# Patient Record
Sex: Male | Born: 1935 | Race: White | Hispanic: No | Marital: Married | Smoking: Former smoker
Health system: Southern US, Community
[De-identification: ages and names within clinical notes are randomized; demographics above are authoritative.]

## PROBLEM LIST (undated history)

## (undated) DIAGNOSIS — I2510 Atherosclerotic heart disease of native coronary artery without angina pectoris: Secondary | ICD-10-CM

## (undated) DIAGNOSIS — R2681 Unsteadiness on feet: Secondary | ICD-10-CM

## (undated) DIAGNOSIS — N189 Chronic kidney disease, unspecified: Secondary | ICD-10-CM

## (undated) DIAGNOSIS — R413 Other amnesia: Secondary | ICD-10-CM

## (undated) DIAGNOSIS — K219 Gastro-esophageal reflux disease without esophagitis: Secondary | ICD-10-CM

## (undated) DIAGNOSIS — C61 Malignant neoplasm of prostate: Secondary | ICD-10-CM

## (undated) DIAGNOSIS — M1990 Unspecified osteoarthritis, unspecified site: Secondary | ICD-10-CM

## (undated) DIAGNOSIS — K2270 Barrett's esophagus without dysplasia: Secondary | ICD-10-CM

## (undated) DIAGNOSIS — I480 Paroxysmal atrial fibrillation: Secondary | ICD-10-CM

## (undated) DIAGNOSIS — E785 Hyperlipidemia, unspecified: Secondary | ICD-10-CM

## (undated) DIAGNOSIS — I4891 Unspecified atrial fibrillation: Secondary | ICD-10-CM

## (undated) DIAGNOSIS — K5730 Diverticulosis of large intestine without perforation or abscess without bleeding: Secondary | ICD-10-CM

## (undated) DIAGNOSIS — N289 Disorder of kidney and ureter, unspecified: Secondary | ICD-10-CM

## (undated) DIAGNOSIS — I509 Heart failure, unspecified: Secondary | ICD-10-CM

## (undated) DIAGNOSIS — Z950 Presence of cardiac pacemaker: Secondary | ICD-10-CM

## (undated) DIAGNOSIS — I10 Essential (primary) hypertension: Secondary | ICD-10-CM

## (undated) HISTORY — DX: Barrett's esophagus without dysplasia: K22.70

## (undated) HISTORY — DX: Gastro-esophageal reflux disease without esophagitis: K21.9

## (undated) HISTORY — DX: Other amnesia: R41.3

## (undated) HISTORY — PX: DOPPLER ECHOCARDIOGRAPHY: SHX263

## (undated) HISTORY — DX: Unspecified atrial fibrillation: I48.91

## (undated) HISTORY — DX: Unspecified osteoarthritis, unspecified site: M19.90

## (undated) HISTORY — DX: Disorder of kidney and ureter, unspecified: N28.9

## (undated) HISTORY — DX: Hyperlipidemia, unspecified: E78.5

## (undated) HISTORY — DX: Essential (primary) hypertension: I10

## (undated) HISTORY — DX: Heart failure, unspecified: I50.9

## (undated) HISTORY — DX: Presence of cardiac pacemaker: Z95.0

## (undated) HISTORY — DX: Paroxysmal atrial fibrillation: I48.0

## (undated) HISTORY — DX: Chronic kidney disease, unspecified: N18.9

## (undated) HISTORY — DX: Atherosclerotic heart disease of native coronary artery without angina pectoris: I25.10

## (undated) HISTORY — DX: Diverticulosis of large intestine without perforation or abscess without bleeding: K57.30

## (undated) HISTORY — DX: Malignant neoplasm of prostate: C61

## (undated) HISTORY — DX: Unsteadiness on feet: R26.81

## (undated) HISTORY — PX: HERNIA REPAIR: SHX51

---

## 1973-08-10 HISTORY — PX: CORONARY ARTERY BYPASS GRAFT: SHX141

## 2001-06-02 ENCOUNTER — Ambulatory Visit (HOSPITAL_COMMUNITY): Admission: RE | Admit: 2001-06-02 | Discharge: 2001-06-02 | Payer: Self-pay | Admitting: Family Medicine

## 2001-06-02 ENCOUNTER — Encounter: Payer: Self-pay | Admitting: Family Medicine

## 2001-07-01 ENCOUNTER — Other Ambulatory Visit: Admission: RE | Admit: 2001-07-01 | Discharge: 2001-07-01 | Payer: Self-pay | Admitting: Urology

## 2001-07-22 ENCOUNTER — Other Ambulatory Visit: Admission: RE | Admit: 2001-07-22 | Discharge: 2001-07-22 | Payer: Self-pay | Admitting: General Surgery

## 2001-07-26 ENCOUNTER — Encounter: Payer: Self-pay | Admitting: Gastroenterology

## 2001-07-26 LAB — HM COLONOSCOPY

## 2001-07-27 ENCOUNTER — Other Ambulatory Visit: Admission: RE | Admit: 2001-07-27 | Discharge: 2001-07-27 | Payer: Self-pay | Admitting: Gastroenterology

## 2001-07-27 ENCOUNTER — Encounter (INDEPENDENT_AMBULATORY_CARE_PROVIDER_SITE_OTHER): Payer: Self-pay | Admitting: Specialist

## 2002-01-04 ENCOUNTER — Ambulatory Visit: Admission: RE | Admit: 2002-01-04 | Discharge: 2002-01-04 | Payer: Self-pay | Admitting: Orthopedic Surgery

## 2003-09-11 DIAGNOSIS — Z950 Presence of cardiac pacemaker: Secondary | ICD-10-CM

## 2003-09-11 HISTORY — DX: Presence of cardiac pacemaker: Z95.0

## 2003-09-11 HISTORY — PX: PACEMAKER PLACEMENT: SHX43

## 2003-10-01 ENCOUNTER — Inpatient Hospital Stay (HOSPITAL_COMMUNITY): Admission: EM | Admit: 2003-10-01 | Discharge: 2003-10-10 | Payer: Self-pay | Admitting: Cardiology

## 2003-10-01 ENCOUNTER — Emergency Department (HOSPITAL_COMMUNITY): Admission: EM | Admit: 2003-10-01 | Discharge: 2003-10-01 | Payer: Self-pay | Admitting: Emergency Medicine

## 2003-10-02 ENCOUNTER — Encounter: Payer: Self-pay | Admitting: Cardiology

## 2004-03-20 ENCOUNTER — Ambulatory Visit (HOSPITAL_COMMUNITY): Admission: RE | Admit: 2004-03-20 | Discharge: 2004-03-20 | Payer: Self-pay | Admitting: Urology

## 2004-09-30 ENCOUNTER — Ambulatory Visit: Payer: Self-pay | Admitting: Internal Medicine

## 2004-10-02 ENCOUNTER — Ambulatory Visit: Payer: Self-pay | Admitting: Cardiology

## 2004-10-08 ENCOUNTER — Ambulatory Visit: Payer: Self-pay

## 2004-10-16 ENCOUNTER — Inpatient Hospital Stay (HOSPITAL_COMMUNITY): Admission: AD | Admit: 2004-10-16 | Discharge: 2004-10-19 | Payer: Self-pay | Admitting: Internal Medicine

## 2004-10-16 ENCOUNTER — Ambulatory Visit: Payer: Self-pay | Admitting: Internal Medicine

## 2004-10-29 ENCOUNTER — Ambulatory Visit: Payer: Self-pay | Admitting: Internal Medicine

## 2004-11-07 ENCOUNTER — Ambulatory Visit: Payer: Self-pay | Admitting: *Deleted

## 2004-11-15 ENCOUNTER — Ambulatory Visit: Payer: Self-pay | Admitting: Cardiology

## 2004-11-26 ENCOUNTER — Ambulatory Visit: Payer: Self-pay | Admitting: Internal Medicine

## 2004-11-26 ENCOUNTER — Ambulatory Visit: Payer: Self-pay | Admitting: Cardiology

## 2004-12-02 ENCOUNTER — Ambulatory Visit: Payer: Self-pay | Admitting: Internal Medicine

## 2005-01-01 ENCOUNTER — Ambulatory Visit: Payer: Self-pay | Admitting: Cardiology

## 2005-01-27 ENCOUNTER — Ambulatory Visit: Payer: Self-pay | Admitting: *Deleted

## 2005-02-24 ENCOUNTER — Ambulatory Visit: Payer: Self-pay | Admitting: *Deleted

## 2005-03-14 ENCOUNTER — Ambulatory Visit: Payer: Self-pay | Admitting: Internal Medicine

## 2005-03-24 ENCOUNTER — Ambulatory Visit: Payer: Self-pay | Admitting: Cardiology

## 2005-03-24 ENCOUNTER — Ambulatory Visit: Payer: Self-pay | Admitting: Internal Medicine

## 2005-04-09 ENCOUNTER — Ambulatory Visit: Payer: Self-pay | Admitting: Internal Medicine

## 2005-04-22 ENCOUNTER — Ambulatory Visit: Payer: Self-pay | Admitting: Cardiovascular Disease

## 2005-04-25 ENCOUNTER — Ambulatory Visit: Payer: Self-pay | Admitting: Internal Medicine

## 2005-05-19 ENCOUNTER — Ambulatory Visit: Payer: Self-pay | Admitting: Internal Medicine

## 2005-06-16 ENCOUNTER — Ambulatory Visit: Payer: Self-pay | Admitting: Cardiology

## 2005-06-25 ENCOUNTER — Ambulatory Visit: Payer: Self-pay | Admitting: Cardiology

## 2005-07-11 ENCOUNTER — Ambulatory Visit: Payer: Self-pay | Admitting: Internal Medicine

## 2005-07-15 ENCOUNTER — Ambulatory Visit: Payer: Self-pay | Admitting: Cardiology

## 2005-07-28 ENCOUNTER — Ambulatory Visit: Payer: Self-pay | Admitting: Cardiology

## 2005-08-13 ENCOUNTER — Ambulatory Visit: Payer: Self-pay | Admitting: Internal Medicine

## 2005-08-18 ENCOUNTER — Ambulatory Visit: Payer: Self-pay | Admitting: Internal Medicine

## 2005-08-28 ENCOUNTER — Ambulatory Visit: Payer: Self-pay | Admitting: Internal Medicine

## 2005-09-16 ENCOUNTER — Ambulatory Visit: Payer: Self-pay | Admitting: Internal Medicine

## 2005-10-09 ENCOUNTER — Ambulatory Visit: Payer: Self-pay | Admitting: Cardiology

## 2005-10-10 DIAGNOSIS — C61 Malignant neoplasm of prostate: Secondary | ICD-10-CM

## 2005-10-10 HISTORY — DX: Malignant neoplasm of prostate: C61

## 2005-11-04 ENCOUNTER — Ambulatory Visit: Payer: Self-pay | Admitting: Cardiology

## 2005-11-06 ENCOUNTER — Ambulatory Visit (HOSPITAL_COMMUNITY): Admission: RE | Admit: 2005-11-06 | Discharge: 2005-11-06 | Payer: Self-pay | Admitting: Urology

## 2005-11-12 ENCOUNTER — Ambulatory Visit: Payer: Self-pay | Admitting: Cardiovascular Disease

## 2005-11-26 ENCOUNTER — Ambulatory Visit: Payer: Self-pay | Admitting: Cardiovascular Disease

## 2005-12-09 ENCOUNTER — Ambulatory Visit: Payer: Self-pay | Admitting: Internal Medicine

## 2005-12-11 ENCOUNTER — Ambulatory Visit: Payer: Self-pay | Admitting: Cardiology

## 2005-12-24 ENCOUNTER — Ambulatory Visit: Payer: Self-pay | Admitting: Internal Medicine

## 2006-01-06 ENCOUNTER — Ambulatory Visit: Payer: Self-pay | Admitting: Internal Medicine

## 2006-01-20 ENCOUNTER — Ambulatory Visit: Payer: Self-pay | Admitting: *Deleted

## 2006-02-03 ENCOUNTER — Ambulatory Visit: Payer: Self-pay | Admitting: Cardiology

## 2006-02-11 ENCOUNTER — Ambulatory Visit: Payer: Self-pay | Admitting: Internal Medicine

## 2006-02-17 ENCOUNTER — Ambulatory Visit: Payer: Self-pay | Admitting: Cardiology

## 2006-02-24 ENCOUNTER — Ambulatory Visit: Payer: Self-pay | Admitting: Cardiology

## 2006-02-24 ENCOUNTER — Ambulatory Visit: Payer: Self-pay | Admitting: Internal Medicine

## 2006-03-03 ENCOUNTER — Ambulatory Visit: Payer: Self-pay | Admitting: Gastroenterology

## 2006-03-10 ENCOUNTER — Ambulatory Visit: Payer: Self-pay | Admitting: Cardiology

## 2006-04-07 ENCOUNTER — Ambulatory Visit: Payer: Self-pay | Admitting: Internal Medicine

## 2006-04-28 ENCOUNTER — Ambulatory Visit: Payer: Self-pay | Admitting: Internal Medicine

## 2006-04-29 ENCOUNTER — Ambulatory Visit: Payer: Self-pay | Admitting: Cardiovascular Disease

## 2006-05-26 ENCOUNTER — Ambulatory Visit: Payer: Self-pay | Admitting: Internal Medicine

## 2006-05-27 ENCOUNTER — Ambulatory Visit: Payer: Self-pay | Admitting: Cardiovascular Disease

## 2006-06-24 ENCOUNTER — Ambulatory Visit: Payer: Self-pay | Admitting: Cardiology

## 2006-07-10 ENCOUNTER — Ambulatory Visit: Payer: Self-pay | Admitting: Internal Medicine

## 2006-07-24 ENCOUNTER — Ambulatory Visit: Payer: Self-pay | Admitting: Cardiology

## 2006-07-29 ENCOUNTER — Ambulatory Visit: Payer: Self-pay | Admitting: Internal Medicine

## 2006-08-17 ENCOUNTER — Ambulatory Visit: Payer: Self-pay | Admitting: Cardiovascular Disease

## 2006-09-01 ENCOUNTER — Ambulatory Visit: Payer: Self-pay | Admitting: Cardiology

## 2006-09-08 ENCOUNTER — Ambulatory Visit: Payer: Self-pay | Admitting: Cardiology

## 2006-09-11 ENCOUNTER — Ambulatory Visit: Payer: Self-pay | Admitting: Internal Medicine

## 2006-09-14 ENCOUNTER — Ambulatory Visit: Payer: Self-pay | Admitting: Internal Medicine

## 2006-09-16 ENCOUNTER — Ambulatory Visit: Payer: Self-pay | Admitting: Internal Medicine

## 2006-09-18 ENCOUNTER — Ambulatory Visit: Payer: Self-pay | Admitting: Cardiology

## 2006-09-23 ENCOUNTER — Ambulatory Visit: Payer: Self-pay | Admitting: Internal Medicine

## 2006-09-25 ENCOUNTER — Ambulatory Visit: Payer: Self-pay | Admitting: Internal Medicine

## 2006-09-28 ENCOUNTER — Ambulatory Visit: Payer: Self-pay | Admitting: Internal Medicine

## 2006-10-06 ENCOUNTER — Ambulatory Visit: Payer: Self-pay | Admitting: Cardiology

## 2006-10-27 ENCOUNTER — Ambulatory Visit: Payer: Self-pay | Admitting: Cardiology

## 2006-11-13 ENCOUNTER — Ambulatory Visit: Payer: Self-pay | Admitting: Cardiology

## 2006-11-30 ENCOUNTER — Ambulatory Visit: Payer: Self-pay | Admitting: Internal Medicine

## 2006-12-14 ENCOUNTER — Ambulatory Visit: Payer: Self-pay | Admitting: Cardiology

## 2006-12-17 ENCOUNTER — Ambulatory Visit: Payer: Self-pay

## 2007-01-01 ENCOUNTER — Ambulatory Visit: Payer: Self-pay | Admitting: Internal Medicine

## 2007-01-11 ENCOUNTER — Ambulatory Visit: Payer: Self-pay | Admitting: Internal Medicine

## 2007-01-18 ENCOUNTER — Ambulatory Visit: Payer: Self-pay | Admitting: Internal Medicine

## 2007-02-22 ENCOUNTER — Ambulatory Visit: Payer: Self-pay | Admitting: Cardiology

## 2007-03-15 ENCOUNTER — Ambulatory Visit: Payer: Self-pay | Admitting: Internal Medicine

## 2007-03-22 ENCOUNTER — Ambulatory Visit: Payer: Self-pay | Admitting: Cardiology

## 2007-04-20 ENCOUNTER — Ambulatory Visit: Payer: Self-pay | Admitting: Internal Medicine

## 2007-04-22 ENCOUNTER — Ambulatory Visit: Payer: Self-pay | Admitting: Internal Medicine

## 2007-05-03 ENCOUNTER — Ambulatory Visit: Payer: Self-pay | Admitting: Internal Medicine

## 2007-05-03 LAB — CONVERTED CEMR LAB
INR: 3.5 — ABNORMAL HIGH (ref 0.9–2.0)
Prothrombin Time: 23.9 s — ABNORMAL HIGH (ref 10.0–14.0)

## 2007-05-06 ENCOUNTER — Ambulatory Visit: Payer: Self-pay | Admitting: Internal Medicine

## 2007-05-18 ENCOUNTER — Ambulatory Visit: Payer: Self-pay | Admitting: Cardiology

## 2007-05-19 ENCOUNTER — Ambulatory Visit: Payer: Self-pay | Admitting: Internal Medicine

## 2007-05-20 DIAGNOSIS — I5032 Chronic diastolic (congestive) heart failure: Secondary | ICD-10-CM | POA: Insufficient documentation

## 2007-05-20 DIAGNOSIS — E785 Hyperlipidemia, unspecified: Secondary | ICD-10-CM | POA: Insufficient documentation

## 2007-05-20 DIAGNOSIS — I2510 Atherosclerotic heart disease of native coronary artery without angina pectoris: Secondary | ICD-10-CM | POA: Insufficient documentation

## 2007-05-26 ENCOUNTER — Ambulatory Visit: Payer: Self-pay | Admitting: Cardiovascular Disease

## 2007-06-09 ENCOUNTER — Ambulatory Visit: Payer: Self-pay | Admitting: Internal Medicine

## 2007-06-13 ENCOUNTER — Ambulatory Visit: Payer: Self-pay | Admitting: Internal Medicine

## 2007-06-25 ENCOUNTER — Ambulatory Visit: Payer: Self-pay | Admitting: Internal Medicine

## 2007-07-07 ENCOUNTER — Ambulatory Visit: Payer: Self-pay | Admitting: Internal Medicine

## 2007-07-09 ENCOUNTER — Encounter: Admission: RE | Admit: 2007-07-09 | Discharge: 2007-10-07 | Payer: Self-pay | Admitting: Internal Medicine

## 2007-07-13 ENCOUNTER — Ambulatory Visit: Payer: Self-pay | Admitting: Internal Medicine

## 2007-07-20 ENCOUNTER — Ambulatory Visit: Payer: Self-pay | Admitting: Internal Medicine

## 2007-07-20 LAB — CONVERTED CEMR LAB
ALT: 109 units/L — ABNORMAL HIGH (ref 0–53)
AST: 113 units/L — ABNORMAL HIGH (ref 0–37)
BUN: 23 mg/dL (ref 6–23)
Basophils Absolute: 0.1 10*3/uL (ref 0.0–0.1)
Basophils Relative: 1.8 % — ABNORMAL HIGH (ref 0.0–1.0)
CO2: 30 meq/L (ref 19–32)
Calcium: 9.4 mg/dL (ref 8.4–10.5)
Chloride: 101 meq/L (ref 96–112)
Cholesterol: 153 mg/dL (ref 0–200)
Creatinine, Ser: 1.4 mg/dL (ref 0.4–1.5)
Eosinophils Absolute: 0.5 10*3/uL (ref 0.0–0.6)
Eosinophils Relative: 7.6 % — ABNORMAL HIGH (ref 0.0–5.0)
GFR calc Af Amer: 62 mL/min
GFR calc non Af Amer: 51 mL/min
Glucose, Bld: 99 mg/dL (ref 70–99)
HCT: 33.4 % — ABNORMAL LOW (ref 39.0–52.0)
HDL: 71.1 mg/dL (ref >39.0)
Hemoglobin: 11.5 g/dL — ABNORMAL LOW (ref 13.0–17.0)
LDL Cholesterol: 57 mg/dL (ref 0–99)
Lymphocytes Relative: 22.6 % (ref 12.0–46.0)
MCHC: 34.4 g/dL (ref 30.0–36.0)
MCV: 90.5 fL (ref 78.0–100.0)
Monocytes Absolute: 0.9 10*3/uL — ABNORMAL HIGH (ref 0.2–0.7)
Monocytes Relative: 12.9 % — ABNORMAL HIGH (ref 3.0–11.0)
Neutro Abs: 3.8 10*3/uL (ref 1.4–7.7)
Neutrophils Relative %: 55.1 % (ref 43.0–77.0)
Platelets: 226 10*3/uL (ref 150–400)
Potassium: 4.3 meq/L (ref 3.5–5.1)
Pro B Natriuretic peptide (BNP): 337 pg/mL — ABNORMAL HIGH (ref 0.0–100.0)
RBC: 3.69 M/uL — ABNORMAL LOW (ref 4.22–5.81)
RDW: 13.8 % (ref 11.5–14.6)
Sodium: 138 meq/L (ref 135–145)
Total CHOL/HDL Ratio: 2.2
Triglycerides: 123 mg/dL (ref 0–149)
VLDL: 25 mg/dL (ref 0–40)
WBC: 6.9 10*3/uL (ref 4.5–10.5)

## 2007-07-29 ENCOUNTER — Ambulatory Visit: Payer: Self-pay | Admitting: Cardiology

## 2007-08-10 ENCOUNTER — Ambulatory Visit: Payer: Self-pay | Admitting: Internal Medicine

## 2007-08-10 LAB — CONVERTED CEMR LAB
ALT: 124 units/L — ABNORMAL HIGH (ref 0–53)
AST: 144 units/L — ABNORMAL HIGH (ref 0–37)
Albumin: 3.9 g/dL (ref 3.5–5.2)
Alkaline Phosphatase: 480 units/L — ABNORMAL HIGH (ref 39–117)
Bilirubin, Direct: 0.2 mg/dL (ref 0.0–0.3)
Total Bilirubin: 0.9 mg/dL (ref 0.3–1.2)
Total CK: 63 units/L (ref 7–195)
Total Protein: 7.2 g/dL (ref 6.0–8.3)

## 2007-08-13 ENCOUNTER — Encounter: Admission: RE | Admit: 2007-08-13 | Discharge: 2007-08-13 | Payer: Self-pay | Admitting: Internal Medicine

## 2007-08-17 ENCOUNTER — Ambulatory Visit: Payer: Self-pay | Admitting: Internal Medicine

## 2007-08-17 LAB — CONVERTED CEMR LAB
ALT: 123 units/L — ABNORMAL HIGH (ref 0–53)
AST: 117 units/L — ABNORMAL HIGH (ref 0–37)
Albumin: 3.7 g/dL (ref 3.5–5.2)
Alkaline Phosphatase: 424 units/L — ABNORMAL HIGH (ref 39–117)
Amylase: 97 units/L (ref 27–131)
BUN: 34 mg/dL — ABNORMAL HIGH (ref 6–23)
Bilirubin, Direct: 0.2 mg/dL (ref 0.0–0.3)
CO2: 29 meq/L (ref 19–32)
Calcium: 9.2 mg/dL (ref 8.4–10.5)
Chloride: 103 meq/L (ref 96–112)
Cholesterol: 238 mg/dL (ref 0–200)
Creatinine, Ser: 1.3 mg/dL (ref 0.4–1.5)
Direct LDL: 127.9 mg/dL
GFR calc Af Amer: 67 mL/min
GFR calc non Af Amer: 55 mL/min
Glucose, Bld: 110 mg/dL — ABNORMAL HIGH (ref 70–99)
HDL: 54.1 mg/dL (ref >39.0)
INR: 2.8 — ABNORMAL HIGH (ref 0.8–1.0)
Lipase: 35 units/L (ref 11.0–59.0)
Potassium: 4.1 meq/L (ref 3.5–5.1)
Prothrombin Time: 20.9 s — ABNORMAL HIGH (ref 10.9–13.3)
Sodium: 140 meq/L (ref 135–145)
Total Bilirubin: 0.7 mg/dL (ref 0.3–1.2)
Total CHOL/HDL Ratio: 4.4
Total Protein: 6.6 g/dL (ref 6.0–8.3)
Triglycerides: 147 mg/dL (ref 0–149)
VLDL: 29 mg/dL (ref 0–40)

## 2007-08-24 ENCOUNTER — Encounter: Payer: Self-pay | Admitting: Internal Medicine

## 2007-08-24 ENCOUNTER — Ambulatory Visit: Payer: Self-pay

## 2007-08-25 ENCOUNTER — Ambulatory Visit: Payer: Self-pay | Admitting: Cardiovascular Disease

## 2007-09-13 ENCOUNTER — Ambulatory Visit: Payer: Self-pay | Admitting: Cardiology

## 2007-09-27 ENCOUNTER — Ambulatory Visit: Payer: Self-pay | Admitting: Cardiology

## 2007-09-27 ENCOUNTER — Ambulatory Visit: Payer: Self-pay | Admitting: Internal Medicine

## 2007-10-05 ENCOUNTER — Ambulatory Visit: Payer: Self-pay | Admitting: Gastroenterology

## 2007-10-05 LAB — CONVERTED CEMR LAB
ALT: 38 units/L (ref 0–53)
AST: 43 units/L — ABNORMAL HIGH (ref 0–37)
Albumin: 3.6 g/dL (ref 3.5–5.2)
Alkaline Phosphatase: 180 units/L — ABNORMAL HIGH (ref 39–117)
Bilirubin, Direct: 0.1 mg/dL (ref 0.0–0.3)
Total Bilirubin: 0.9 mg/dL (ref 0.3–1.2)
Total Protein: 6.6 g/dL (ref 6.0–8.3)

## 2007-10-11 ENCOUNTER — Ambulatory Visit: Payer: Self-pay | Admitting: Internal Medicine

## 2007-10-11 DIAGNOSIS — I10 Essential (primary) hypertension: Secondary | ICD-10-CM

## 2007-10-11 DIAGNOSIS — R740 Nonspecific elevation of levels of transaminase and lactic acid dehydrogenase [LDH]: Secondary | ICD-10-CM

## 2007-10-13 ENCOUNTER — Ambulatory Visit: Payer: Self-pay | Admitting: Cardiology

## 2007-10-28 ENCOUNTER — Ambulatory Visit: Payer: Self-pay | Admitting: Cardiology

## 2007-11-15 ENCOUNTER — Ambulatory Visit: Payer: Self-pay | Admitting: Internal Medicine

## 2007-11-16 ENCOUNTER — Ambulatory Visit: Payer: Self-pay | Admitting: Internal Medicine

## 2007-11-16 DIAGNOSIS — Z8546 Personal history of malignant neoplasm of prostate: Secondary | ICD-10-CM

## 2007-11-16 DIAGNOSIS — M67919 Unspecified disorder of synovium and tendon, unspecified shoulder: Secondary | ICD-10-CM | POA: Insufficient documentation

## 2007-11-16 DIAGNOSIS — M719 Bursopathy, unspecified: Secondary | ICD-10-CM | POA: Insufficient documentation

## 2007-12-02 DIAGNOSIS — K2270 Barrett's esophagus without dysplasia: Secondary | ICD-10-CM

## 2007-12-02 DIAGNOSIS — R945 Abnormal results of liver function studies: Secondary | ICD-10-CM

## 2007-12-02 DIAGNOSIS — K5730 Diverticulosis of large intestine without perforation or abscess without bleeding: Secondary | ICD-10-CM | POA: Insufficient documentation

## 2007-12-02 DIAGNOSIS — K219 Gastro-esophageal reflux disease without esophagitis: Secondary | ICD-10-CM

## 2007-12-06 ENCOUNTER — Ambulatory Visit: Payer: Self-pay | Admitting: Cardiology

## 2007-12-10 ENCOUNTER — Telehealth: Payer: Self-pay | Admitting: Internal Medicine

## 2007-12-13 ENCOUNTER — Ambulatory Visit: Payer: Self-pay

## 2007-12-21 ENCOUNTER — Ambulatory Visit: Payer: Self-pay | Admitting: Internal Medicine

## 2007-12-25 ENCOUNTER — Ambulatory Visit: Payer: Self-pay | Admitting: Family Medicine

## 2007-12-25 DIAGNOSIS — S9030XA Contusion of unspecified foot, initial encounter: Secondary | ICD-10-CM | POA: Insufficient documentation

## 2008-01-07 ENCOUNTER — Ambulatory Visit: Payer: Self-pay | Admitting: Internal Medicine

## 2008-01-11 ENCOUNTER — Ambulatory Visit: Payer: Self-pay | Admitting: Internal Medicine

## 2008-01-12 ENCOUNTER — Ambulatory Visit: Payer: Self-pay | Admitting: Internal Medicine

## 2008-01-12 DIAGNOSIS — R413 Other amnesia: Secondary | ICD-10-CM

## 2008-01-12 LAB — CONVERTED CEMR LAB
ALT: 24 units/L (ref 0–53)
AST: 26 units/L (ref 0–37)
Albumin: 3.5 g/dL (ref 3.5–5.2)
Alkaline Phosphatase: 90 units/L (ref 39–117)
Bilirubin, Direct: 0.1 mg/dL (ref 0.0–0.3)
Total Bilirubin: 0.9 mg/dL (ref 0.3–1.2)
Total Protein: 6.2 g/dL (ref 6.0–8.3)

## 2008-01-31 ENCOUNTER — Ambulatory Visit: Payer: Self-pay | Admitting: Cardiovascular Disease

## 2008-02-28 ENCOUNTER — Ambulatory Visit: Payer: Self-pay | Admitting: Internal Medicine

## 2008-03-27 ENCOUNTER — Ambulatory Visit: Payer: Self-pay | Admitting: Cardiology

## 2008-03-28 ENCOUNTER — Ambulatory Visit: Payer: Self-pay | Admitting: Internal Medicine

## 2008-03-28 DIAGNOSIS — H9209 Otalgia, unspecified ear: Secondary | ICD-10-CM | POA: Insufficient documentation

## 2008-04-10 ENCOUNTER — Ambulatory Visit: Payer: Self-pay | Admitting: Cardiology

## 2008-04-18 ENCOUNTER — Ambulatory Visit: Payer: Self-pay | Admitting: Internal Medicine

## 2008-04-18 LAB — CONVERTED CEMR LAB
ALT: 29 units/L (ref 0–53)
AST: 31 units/L (ref 0–37)
BUN: 30 mg/dL — ABNORMAL HIGH (ref 6–23)
Basophils Absolute: 0 10*3/uL (ref 0.0–0.1)
Basophils Relative: 0.5 % (ref 0.0–1.0)
CO2: 30 meq/L (ref 19–32)
Calcium: 9.1 mg/dL (ref 8.4–10.5)
Chloride: 103 meq/L (ref 96–112)
Cholesterol: 144 mg/dL (ref 0–200)
Creatinine, Ser: 1.4 mg/dL (ref 0.4–1.5)
Eosinophils Absolute: 0.2 10*3/uL (ref 0.0–0.7)
Eosinophils Relative: 3.9 % (ref 0.0–5.0)
GFR calc Af Amer: 62 mL/min
GFR calc non Af Amer: 51 mL/min
Glucose, Bld: 100 mg/dL — ABNORMAL HIGH (ref 70–99)
HCT: 38.3 % — ABNORMAL LOW (ref 39.0–52.0)
HDL: 37.6 mg/dL — ABNORMAL LOW (ref >39.0)
Hemoglobin: 13.1 g/dL (ref 13.0–17.0)
LDL Cholesterol: 88 mg/dL (ref 0–99)
Lymphocytes Relative: 25.2 % (ref 12.0–46.0)
MCHC: 34.3 g/dL (ref 30.0–36.0)
MCV: 88.4 fL (ref 78.0–100.0)
Monocytes Absolute: 0.9 10*3/uL (ref 0.1–1.0)
Monocytes Relative: 14.6 % — ABNORMAL HIGH (ref 3.0–12.0)
Neutro Abs: 3.3 10*3/uL (ref 1.4–7.7)
Neutrophils Relative %: 55.8 % (ref 43.0–77.0)
Platelets: 210 10*3/uL (ref 150–400)
Potassium: 4.2 meq/L (ref 3.5–5.1)
RBC: 4.33 M/uL (ref 4.22–5.81)
RDW: 14.1 % (ref 11.5–14.6)
Sodium: 141 meq/L (ref 135–145)
Total CHOL/HDL Ratio: 3.8
Triglycerides: 90 mg/dL (ref 0–149)
VLDL: 18 mg/dL (ref 0–40)
WBC: 5.9 10*3/uL (ref 4.5–10.5)

## 2008-04-24 ENCOUNTER — Ambulatory Visit: Payer: Self-pay | Admitting: Internal Medicine

## 2008-04-25 ENCOUNTER — Ambulatory Visit: Payer: Self-pay | Admitting: Cardiology

## 2008-05-09 ENCOUNTER — Encounter: Payer: Self-pay | Admitting: Internal Medicine

## 2008-05-22 ENCOUNTER — Ambulatory Visit: Payer: Self-pay | Admitting: Internal Medicine

## 2008-06-19 ENCOUNTER — Ambulatory Visit: Payer: Self-pay | Admitting: Cardiology

## 2008-07-18 ENCOUNTER — Ambulatory Visit: Payer: Self-pay | Admitting: Cardiology

## 2008-07-27 ENCOUNTER — Telehealth: Payer: Self-pay | Admitting: Internal Medicine

## 2008-07-28 ENCOUNTER — Emergency Department (HOSPITAL_COMMUNITY): Admission: EM | Admit: 2008-07-28 | Discharge: 2008-07-28 | Payer: Self-pay | Admitting: Emergency Medicine

## 2008-07-29 ENCOUNTER — Telehealth (INDEPENDENT_AMBULATORY_CARE_PROVIDER_SITE_OTHER): Payer: Self-pay | Admitting: *Deleted

## 2008-07-31 ENCOUNTER — Telehealth: Payer: Self-pay | Admitting: Internal Medicine

## 2008-07-31 ENCOUNTER — Ambulatory Visit: Payer: Self-pay | Admitting: Internal Medicine

## 2008-07-31 LAB — CONVERTED CEMR LAB
ALT: 58 units/L — ABNORMAL HIGH (ref 0–53)
AST: 56 units/L — ABNORMAL HIGH (ref 0–37)
Total CK: 69 units/L (ref 7–195)

## 2008-08-07 ENCOUNTER — Encounter: Payer: Self-pay | Admitting: Internal Medicine

## 2008-08-08 ENCOUNTER — Telehealth: Payer: Self-pay | Admitting: Internal Medicine

## 2008-08-11 ENCOUNTER — Ambulatory Visit (HOSPITAL_COMMUNITY): Admission: RE | Admit: 2008-08-11 | Discharge: 2008-08-11 | Payer: Self-pay | Admitting: Urology

## 2008-08-15 ENCOUNTER — Ambulatory Visit: Payer: Self-pay | Admitting: Cardiovascular Disease

## 2008-08-29 ENCOUNTER — Ambulatory Visit: Payer: Self-pay | Admitting: Internal Medicine

## 2008-09-05 ENCOUNTER — Encounter: Payer: Self-pay | Admitting: Internal Medicine

## 2008-09-13 ENCOUNTER — Ambulatory Visit: Payer: Self-pay | Admitting: Cardiology

## 2008-09-19 ENCOUNTER — Ambulatory Visit: Payer: Self-pay | Admitting: Internal Medicine

## 2008-09-29 ENCOUNTER — Ambulatory Visit: Payer: Self-pay | Admitting: Internal Medicine

## 2008-09-29 DIAGNOSIS — I4891 Unspecified atrial fibrillation: Secondary | ICD-10-CM | POA: Insufficient documentation

## 2008-10-04 ENCOUNTER — Ambulatory Visit: Payer: Self-pay | Admitting: Cardiology

## 2008-10-12 ENCOUNTER — Encounter: Payer: Self-pay | Admitting: Internal Medicine

## 2008-10-24 ENCOUNTER — Ambulatory Visit: Payer: Self-pay | Admitting: Cardiology

## 2008-11-21 ENCOUNTER — Ambulatory Visit: Payer: Self-pay | Admitting: Cardiology

## 2008-12-11 ENCOUNTER — Ambulatory Visit: Payer: Self-pay

## 2008-12-11 ENCOUNTER — Encounter: Payer: Self-pay | Admitting: Internal Medicine

## 2008-12-19 ENCOUNTER — Encounter: Payer: Self-pay | Admitting: Internal Medicine

## 2008-12-19 DIAGNOSIS — R269 Unspecified abnormalities of gait and mobility: Secondary | ICD-10-CM

## 2009-01-01 ENCOUNTER — Ambulatory Visit: Payer: Self-pay | Admitting: Internal Medicine

## 2009-01-01 LAB — CONVERTED CEMR LAB
ALT: 21 units/L (ref 0–53)
AST: 24 units/L (ref 0–37)
BUN: 34 mg/dL — ABNORMAL HIGH (ref 6–23)
Basophils Absolute: 0.1 10*3/uL (ref 0.0–0.1)
Basophils Relative: 1 % (ref 0.0–3.0)
CO2: 30 meq/L (ref 19–32)
Calcium: 9.2 mg/dL (ref 8.4–10.5)
Chloride: 102 meq/L (ref 96–112)
Cholesterol: 171 mg/dL (ref 0–200)
Creatinine, Ser: 1.4 mg/dL (ref 0.4–1.5)
Eosinophils Absolute: 0.5 10*3/uL (ref 0.0–0.7)
Eosinophils Relative: 7 % — ABNORMAL HIGH (ref 0.0–5.0)
GFR calc Af Amer: 61 mL/min
GFR calc non Af Amer: 51 mL/min
Glucose, Bld: 90 mg/dL (ref 70–99)
HCT: 37.9 % — ABNORMAL LOW (ref 39.0–52.0)
HDL: 36.3 mg/dL — ABNORMAL LOW (ref >39.0)
Hemoglobin: 12.8 g/dL — ABNORMAL LOW (ref 13.0–17.0)
LDL Cholesterol: 107 mg/dL — ABNORMAL HIGH (ref 0–99)
Lymphocytes Relative: 18.5 % (ref 12.0–46.0)
MCHC: 33.8 g/dL (ref 30.0–36.0)
MCV: 89 fL (ref 78.0–100.0)
Monocytes Absolute: 0.9 10*3/uL (ref 0.1–1.0)
Monocytes Relative: 12.7 % — ABNORMAL HIGH (ref 3.0–12.0)
Neutro Abs: 4 10*3/uL (ref 1.4–7.7)
Neutrophils Relative %: 60.8 % (ref 43.0–77.0)
Platelets: 186 10*3/uL (ref 150–400)
Potassium: 4 meq/L (ref 3.5–5.1)
RBC: 4.25 M/uL (ref 4.22–5.81)
RDW: 15.1 % — ABNORMAL HIGH (ref 11.5–14.6)
Sodium: 140 meq/L (ref 135–145)
TSH: 3.83 microintl units/mL (ref 0.35–5.50)
Total CHOL/HDL Ratio: 4.7
Triglycerides: 140 mg/dL (ref 0–149)
VLDL: 28 mg/dL (ref 0–40)
WBC: 6.7 10*3/uL (ref 4.5–10.5)

## 2009-01-05 ENCOUNTER — Ambulatory Visit (HOSPITAL_BASED_OUTPATIENT_CLINIC_OR_DEPARTMENT_OTHER): Admission: RE | Admit: 2009-01-05 | Discharge: 2009-01-05 | Payer: Self-pay | Admitting: Internal Medicine

## 2009-01-05 ENCOUNTER — Telehealth: Payer: Self-pay | Admitting: Internal Medicine

## 2009-01-05 ENCOUNTER — Ambulatory Visit: Payer: Self-pay | Admitting: Diagnostic Radiology

## 2009-01-05 ENCOUNTER — Ambulatory Visit: Payer: Self-pay | Admitting: Internal Medicine

## 2009-01-05 DIAGNOSIS — R109 Unspecified abdominal pain: Secondary | ICD-10-CM | POA: Insufficient documentation

## 2009-01-08 ENCOUNTER — Telehealth: Payer: Self-pay | Admitting: Internal Medicine

## 2009-01-08 DIAGNOSIS — K4090 Unilateral inguinal hernia, without obstruction or gangrene, not specified as recurrent: Secondary | ICD-10-CM | POA: Insufficient documentation

## 2009-01-15 ENCOUNTER — Ambulatory Visit: Payer: Self-pay | Admitting: Internal Medicine

## 2009-01-15 ENCOUNTER — Encounter: Admission: RE | Admit: 2009-01-15 | Discharge: 2009-01-15 | Payer: Self-pay | Admitting: Internal Medicine

## 2009-01-17 ENCOUNTER — Ambulatory Visit: Payer: Self-pay | Admitting: Cardiovascular Disease

## 2009-01-17 ENCOUNTER — Ambulatory Visit: Payer: Self-pay | Admitting: Cardiology

## 2009-01-17 ENCOUNTER — Encounter: Payer: Self-pay | Admitting: Internal Medicine

## 2009-01-18 ENCOUNTER — Telehealth: Payer: Self-pay | Admitting: Internal Medicine

## 2009-01-30 ENCOUNTER — Ambulatory Visit: Payer: Self-pay | Admitting: Gastroenterology

## 2009-01-30 DIAGNOSIS — R933 Abnormal findings on diagnostic imaging of other parts of digestive tract: Secondary | ICD-10-CM

## 2009-02-13 ENCOUNTER — Ambulatory Visit: Payer: Self-pay | Admitting: Cardiovascular Disease

## 2009-03-13 ENCOUNTER — Ambulatory Visit: Payer: Self-pay | Admitting: Cardiology

## 2009-03-16 ENCOUNTER — Encounter: Payer: Self-pay | Admitting: Internal Medicine

## 2009-03-22 ENCOUNTER — Ambulatory Visit: Payer: Self-pay | Admitting: Internal Medicine

## 2009-04-02 ENCOUNTER — Encounter: Payer: Self-pay | Admitting: Internal Medicine

## 2009-04-04 DIAGNOSIS — Z950 Presence of cardiac pacemaker: Secondary | ICD-10-CM

## 2009-04-04 DIAGNOSIS — N189 Chronic kidney disease, unspecified: Secondary | ICD-10-CM

## 2009-04-10 ENCOUNTER — Ambulatory Visit: Payer: Self-pay | Admitting: Cardiovascular Disease

## 2009-04-10 LAB — CONVERTED CEMR LAB
POC INR: 1.9
Protime: 16.9

## 2009-04-11 ENCOUNTER — Encounter: Payer: Self-pay | Admitting: *Deleted

## 2009-05-07 ENCOUNTER — Ambulatory Visit: Payer: Self-pay | Admitting: Cardiology

## 2009-05-07 LAB — CONVERTED CEMR LAB
POC INR: 2.4
Prothrombin Time: 19 s

## 2009-05-16 ENCOUNTER — Encounter: Payer: Self-pay | Admitting: *Deleted

## 2009-06-05 ENCOUNTER — Ambulatory Visit: Payer: Self-pay | Admitting: Cardiology

## 2009-06-05 LAB — CONVERTED CEMR LAB
POC INR: 2.3
Prothrombin Time: 18.5 s

## 2009-06-07 ENCOUNTER — Ambulatory Visit: Payer: Self-pay | Admitting: Internal Medicine

## 2009-06-07 DIAGNOSIS — M19049 Primary osteoarthritis, unspecified hand: Secondary | ICD-10-CM | POA: Insufficient documentation

## 2009-06-08 ENCOUNTER — Encounter (INDEPENDENT_AMBULATORY_CARE_PROVIDER_SITE_OTHER): Payer: Self-pay | Admitting: *Deleted

## 2009-06-28 ENCOUNTER — Telehealth: Payer: Self-pay | Admitting: Internal Medicine

## 2009-07-03 ENCOUNTER — Ambulatory Visit: Payer: Self-pay | Admitting: Cardiovascular Disease

## 2009-07-03 LAB — CONVERTED CEMR LAB: POC INR: 2.9

## 2009-07-11 ENCOUNTER — Ambulatory Visit: Payer: Self-pay | Admitting: Cardiology

## 2009-07-17 ENCOUNTER — Telehealth: Payer: Self-pay | Admitting: Internal Medicine

## 2009-07-24 ENCOUNTER — Ambulatory Visit: Payer: Self-pay | Admitting: Internal Medicine

## 2009-07-27 ENCOUNTER — Ambulatory Visit (HOSPITAL_BASED_OUTPATIENT_CLINIC_OR_DEPARTMENT_OTHER): Admission: RE | Admit: 2009-07-27 | Discharge: 2009-07-27 | Payer: Self-pay | Admitting: Internal Medicine

## 2009-07-27 ENCOUNTER — Ambulatory Visit: Payer: Self-pay | Admitting: Internal Medicine

## 2009-07-27 ENCOUNTER — Telehealth: Payer: Self-pay | Admitting: Internal Medicine

## 2009-07-27 ENCOUNTER — Ambulatory Visit: Payer: Self-pay | Admitting: Diagnostic Radiology

## 2009-07-27 DIAGNOSIS — M25539 Pain in unspecified wrist: Secondary | ICD-10-CM

## 2009-08-02 ENCOUNTER — Telehealth: Payer: Self-pay | Admitting: Internal Medicine

## 2009-08-06 ENCOUNTER — Telehealth: Payer: Self-pay | Admitting: Internal Medicine

## 2009-08-07 ENCOUNTER — Ambulatory Visit: Payer: Self-pay | Admitting: Cardiology

## 2009-08-07 LAB — CONVERTED CEMR LAB: POC INR: 2.9

## 2009-08-08 ENCOUNTER — Telehealth (INDEPENDENT_AMBULATORY_CARE_PROVIDER_SITE_OTHER): Payer: Self-pay | Admitting: *Deleted

## 2009-08-08 ENCOUNTER — Encounter: Payer: Self-pay | Admitting: Internal Medicine

## 2009-08-10 ENCOUNTER — Telehealth: Payer: Self-pay | Admitting: Internal Medicine

## 2009-08-10 ENCOUNTER — Ambulatory Visit: Payer: Self-pay | Admitting: Internal Medicine

## 2009-08-15 ENCOUNTER — Encounter: Payer: Self-pay | Admitting: Internal Medicine

## 2009-08-21 ENCOUNTER — Encounter: Admission: RE | Admit: 2009-08-21 | Discharge: 2009-11-06 | Payer: Self-pay | Admitting: Internal Medicine

## 2009-08-24 ENCOUNTER — Encounter: Payer: Self-pay | Admitting: Internal Medicine

## 2009-08-30 ENCOUNTER — Encounter: Payer: Self-pay | Admitting: Internal Medicine

## 2009-09-04 ENCOUNTER — Ambulatory Visit: Payer: Self-pay | Admitting: Cardiovascular Disease

## 2009-09-24 ENCOUNTER — Encounter: Payer: Self-pay | Admitting: Internal Medicine

## 2009-09-27 ENCOUNTER — Encounter: Payer: Self-pay | Admitting: Internal Medicine

## 2009-10-10 ENCOUNTER — Ambulatory Visit: Payer: Self-pay | Admitting: Cardiology

## 2009-10-10 LAB — CONVERTED CEMR LAB: POC INR: 3.2

## 2009-10-17 ENCOUNTER — Encounter: Payer: Self-pay | Admitting: Internal Medicine

## 2009-10-25 ENCOUNTER — Encounter: Payer: Self-pay | Admitting: Internal Medicine

## 2009-10-26 ENCOUNTER — Ambulatory Visit: Payer: Self-pay | Admitting: Internal Medicine

## 2009-10-26 DIAGNOSIS — B372 Candidiasis of skin and nail: Secondary | ICD-10-CM

## 2009-10-31 ENCOUNTER — Ambulatory Visit: Payer: Self-pay | Admitting: Cardiology

## 2009-11-05 ENCOUNTER — Encounter: Payer: Self-pay | Admitting: Internal Medicine

## 2009-11-12 ENCOUNTER — Telehealth: Payer: Self-pay | Admitting: Internal Medicine

## 2009-11-13 ENCOUNTER — Telehealth (INDEPENDENT_AMBULATORY_CARE_PROVIDER_SITE_OTHER): Payer: Self-pay | Admitting: *Deleted

## 2009-11-17 ENCOUNTER — Encounter: Payer: Self-pay | Admitting: Internal Medicine

## 2009-11-19 ENCOUNTER — Ambulatory Visit: Payer: Self-pay | Admitting: Internal Medicine

## 2009-11-22 ENCOUNTER — Ambulatory Visit: Payer: Self-pay | Admitting: Internal Medicine

## 2009-11-22 LAB — CONVERTED CEMR LAB: POC INR: 2.1

## 2009-11-27 ENCOUNTER — Encounter: Payer: Self-pay | Admitting: Internal Medicine

## 2009-12-13 ENCOUNTER — Ambulatory Visit: Payer: Self-pay | Admitting: Cardiology

## 2009-12-13 LAB — CONVERTED CEMR LAB: POC INR: 2.6

## 2009-12-25 ENCOUNTER — Telehealth: Payer: Self-pay | Admitting: Internal Medicine

## 2009-12-26 ENCOUNTER — Telehealth: Payer: Self-pay | Admitting: Cardiology

## 2010-01-09 ENCOUNTER — Ambulatory Visit: Payer: Self-pay | Admitting: Cardiology

## 2010-01-09 LAB — CONVERTED CEMR LAB: POC INR: 2.7

## 2010-02-05 ENCOUNTER — Ambulatory Visit: Payer: Self-pay | Admitting: Internal Medicine

## 2010-02-12 ENCOUNTER — Encounter: Payer: Self-pay | Admitting: Internal Medicine

## 2010-02-25 ENCOUNTER — Ambulatory Visit: Payer: Self-pay | Admitting: Internal Medicine

## 2010-03-04 ENCOUNTER — Ambulatory Visit: Payer: Self-pay | Admitting: Internal Medicine

## 2010-03-04 LAB — CONVERTED CEMR LAB
ALT: 18 units/L (ref 0–53)
AST: 24 units/L (ref 0–37)
Alkaline Phosphatase: 53 units/L (ref 39–117)
BUN: 27 mg/dL — ABNORMAL HIGH (ref 6–23)
Basophils Absolute: 0.1 10*3/uL (ref 0.0–0.1)
Cholesterol: 194 mg/dL (ref 0–200)
Creatinine, Ser: 1.25 mg/dL (ref 0.40–1.50)
Eosinophils Absolute: 0.2 10*3/uL (ref 0.0–0.7)
Eosinophils Relative: 3 % (ref 0–5)
HCT: 39.4 % (ref 39.0–52.0)
Indirect Bilirubin: 0.4 mg/dL (ref 0.0–0.9)
Lymphocytes Relative: 31 % (ref 12–46)
MCV: 89.5 fL (ref 78.0–100.0)
Neutrophils Relative %: 52 % (ref 43–77)
Platelets: 230 10*3/uL (ref 150–400)
RDW: 14.6 % (ref 11.5–15.5)
Total Protein: 6.6 g/dL (ref 6.0–8.3)
Triglycerides: 272 mg/dL — ABNORMAL HIGH (ref <150)

## 2010-03-05 ENCOUNTER — Ambulatory Visit: Payer: Self-pay | Admitting: Cardiovascular Disease

## 2010-03-05 LAB — CONVERTED CEMR LAB: POC INR: 3.3

## 2010-03-07 ENCOUNTER — Encounter: Payer: Self-pay | Admitting: Internal Medicine

## 2010-03-21 ENCOUNTER — Encounter: Payer: Self-pay | Admitting: Internal Medicine

## 2010-03-29 ENCOUNTER — Ambulatory Visit: Payer: Self-pay | Admitting: Internal Medicine

## 2010-04-19 ENCOUNTER — Ambulatory Visit: Payer: Self-pay | Admitting: Cardiovascular Disease

## 2010-04-23 ENCOUNTER — Ambulatory Visit: Payer: Self-pay | Admitting: Cardiology

## 2010-04-23 DIAGNOSIS — I252 Old myocardial infarction: Secondary | ICD-10-CM

## 2010-05-03 ENCOUNTER — Ambulatory Visit: Payer: Self-pay | Admitting: Cardiology

## 2010-05-24 ENCOUNTER — Ambulatory Visit: Payer: Self-pay | Admitting: Cardiology

## 2010-05-24 LAB — CONVERTED CEMR LAB: POC INR: 3

## 2010-05-31 ENCOUNTER — Encounter (INDEPENDENT_AMBULATORY_CARE_PROVIDER_SITE_OTHER): Payer: Self-pay | Admitting: *Deleted

## 2010-06-04 ENCOUNTER — Encounter: Payer: Self-pay | Admitting: Internal Medicine

## 2010-06-11 ENCOUNTER — Encounter: Payer: Self-pay | Admitting: Internal Medicine

## 2010-06-11 ENCOUNTER — Telehealth (INDEPENDENT_AMBULATORY_CARE_PROVIDER_SITE_OTHER): Payer: Self-pay | Admitting: *Deleted

## 2010-06-11 ENCOUNTER — Ambulatory Visit: Payer: Self-pay | Admitting: Internal Medicine

## 2010-06-14 ENCOUNTER — Ambulatory Visit: Payer: Self-pay | Admitting: Cardiovascular Disease

## 2010-06-14 LAB — CONVERTED CEMR LAB: POC INR: 3.4

## 2010-06-25 ENCOUNTER — Ambulatory Visit: Payer: Self-pay | Admitting: Internal Medicine

## 2010-06-25 DIAGNOSIS — R5383 Other fatigue: Secondary | ICD-10-CM

## 2010-06-25 DIAGNOSIS — R5381 Other malaise: Secondary | ICD-10-CM

## 2010-06-25 LAB — CONVERTED CEMR LAB
ALT: 27 units/L (ref 0–53)
AST: 28 units/L (ref 0–37)
Bilirubin, Direct: 0.1 mg/dL (ref 0.0–0.3)
Chloride: 103 meq/L (ref 96–112)
Creatinine, Ser: 1.37 mg/dL (ref 0.40–1.50)
Indirect Bilirubin: 0.4 mg/dL (ref 0.0–0.9)
Potassium: 4.5 meq/L (ref 3.5–5.3)

## 2010-06-27 ENCOUNTER — Encounter: Payer: Self-pay | Admitting: Internal Medicine

## 2010-06-28 ENCOUNTER — Encounter: Payer: Self-pay | Admitting: Internal Medicine

## 2010-07-02 ENCOUNTER — Ambulatory Visit: Payer: Self-pay | Admitting: Cardiovascular Disease

## 2010-07-02 LAB — CONVERTED CEMR LAB: POC INR: 2.6

## 2010-07-17 ENCOUNTER — Ambulatory Visit: Payer: Self-pay | Admitting: Cardiovascular Disease

## 2010-08-07 ENCOUNTER — Ambulatory Visit: Payer: Self-pay | Admitting: Internal Medicine

## 2010-08-12 ENCOUNTER — Telehealth: Payer: Self-pay | Admitting: Internal Medicine

## 2010-08-13 ENCOUNTER — Ambulatory Visit: Payer: Self-pay | Admitting: Internal Medicine

## 2010-08-13 DIAGNOSIS — M810 Age-related osteoporosis without current pathological fracture: Secondary | ICD-10-CM | POA: Insufficient documentation

## 2010-08-13 DIAGNOSIS — M25579 Pain in unspecified ankle and joints of unspecified foot: Secondary | ICD-10-CM

## 2010-08-13 LAB — CONVERTED CEMR LAB
Cholesterol, target level: 200 mg/dL
LDL Goal: 100 mg/dL

## 2010-08-20 ENCOUNTER — Ambulatory Visit: Payer: Self-pay | Admitting: Internal Medicine

## 2010-09-04 ENCOUNTER — Ambulatory Visit: Payer: Self-pay | Admitting: Internal Medicine

## 2010-09-06 ENCOUNTER — Encounter (INDEPENDENT_AMBULATORY_CARE_PROVIDER_SITE_OTHER): Payer: Self-pay | Admitting: *Deleted

## 2010-09-19 ENCOUNTER — Ambulatory Visit: Payer: Self-pay | Admitting: Internal Medicine

## 2010-10-02 ENCOUNTER — Ambulatory Visit: Payer: Self-pay | Admitting: Internal Medicine

## 2010-10-02 ENCOUNTER — Encounter: Payer: Self-pay | Admitting: Internal Medicine

## 2010-10-02 LAB — CONVERTED CEMR LAB: POC INR: 2.4

## 2010-10-07 ENCOUNTER — Telehealth (INDEPENDENT_AMBULATORY_CARE_PROVIDER_SITE_OTHER): Payer: Self-pay | Admitting: *Deleted

## 2010-10-07 ENCOUNTER — Ambulatory Visit: Payer: Self-pay | Admitting: Internal Medicine

## 2010-10-07 DIAGNOSIS — J40 Bronchitis, not specified as acute or chronic: Secondary | ICD-10-CM | POA: Insufficient documentation

## 2010-10-22 ENCOUNTER — Encounter: Payer: Self-pay | Admitting: Internal Medicine

## 2010-10-25 ENCOUNTER — Ambulatory Visit: Payer: Self-pay | Admitting: Cardiology

## 2010-10-30 ENCOUNTER — Ambulatory Visit: Payer: Self-pay | Admitting: Cardiology

## 2010-10-30 LAB — CONVERTED CEMR LAB: POC INR: 2.8

## 2010-11-27 ENCOUNTER — Ambulatory Visit: Admission: RE | Admit: 2010-11-27 | Discharge: 2010-11-27 | Payer: Self-pay | Source: Home / Self Care

## 2010-12-01 ENCOUNTER — Encounter: Payer: Self-pay | Admitting: Urology

## 2010-12-01 ENCOUNTER — Encounter: Payer: Self-pay | Admitting: Orthopedic Surgery

## 2010-12-01 ENCOUNTER — Encounter: Payer: Self-pay | Admitting: Internal Medicine

## 2010-12-04 ENCOUNTER — Telehealth: Payer: Self-pay | Admitting: Internal Medicine

## 2010-12-10 ENCOUNTER — Telehealth: Payer: Self-pay | Admitting: Internal Medicine

## 2010-12-10 NOTE — Letter (Signed)
Summary: [facility]  [facility]   Imported By: Lanelle Bal 02/21/2010 13:17:52  _____________________________________________________________________  External Attachment:    Type:   Image     Comment:   External Document

## 2010-12-10 NOTE — Medication Information (Signed)
Summary: rov/sp  Anticoagulant Therapy  Managed by: Weston Brass, PharmD Referring MD: Sharrell Ku PCP: [name] DO Supervising MD: Ladona Ridgel MD, Sharlot Gowda Indication 1: Congestive Heart Failure (ICD-428.0) Lab Used: [facility] Site: [facility] INR POC 2.2 INR RANGE 2 - 3  Dietary changes: no    Health status changes: no    Bleeding/hemorrhagic complications: no    Recent/future hospitalizations: no    Any changes in medication regimen? no    Recent/future dental: no  Any missed doses?: no       Is patient compliant with meds? yes       Allergies: No Known Drug Allergies  Anticoagulation Management History:      The patient is taking warfarin and comes in today for a routine follow up visit.  Positive risk factors for bleeding include an age of 75 years or older.  The bleeding index is 'intermediate risk'.  Positive CHADS2 values include History of CHF, History of HTN, and Age > 30 years old.  The start date was 10/11/2003.  His last INR was 3.0.  Anticoagulation responsible provider: Ladona Ridgel MD, Sharlot Gowda.  INR POC: 2.2.  Cuvette Lot#: [id number].  Exp: 09/2011.    Anticoagulation Management Assessment/Plan:      The patient's current anticoagulation dose is Warfarin sodium 1 mg tabs: Use as directed by Anticoagualtion Clinic.  The target INR is 2.0-3.0.  The next INR is due 10/02/2010.  Anticoagulation instructions were given to patient.  Results were reviewed/authorized by Weston Brass, PharmD.  He was notified by Ilean Skill D candidate.         Prior Anticoagulation Instructions: INR 2.2  Continue same dose of 1 tablet every day except 2 tablets on Monday, Wednesday and Friday.   Recheck INR in 4 weeks.   Current Anticoagulation Instructions: INR 2.2  Continue taking same dose of 1 tablet everyday except 2 tablets on Sunday, Tuesday, and Thursday.  Recheck in 4 weeks.

## 2010-12-10 NOTE — Assessment & Plan Note (Signed)
Summary: cough congestion/mhf   Vital Signs:  Patient profile:   77 year old male Height:      66 inches Weight:      196.25 pounds BMI:     31.79 O2 Sat:      96 % on Room air Temp:     97.5 degrees F oral Resp:     22 per minute BP sitting:   98 / 60  (right arm) Cuff size:   large  Vitals Entered By: Glendell Docker CMA (October 07, 2010 11:04 AM)  O2 Flow:  Room air CC: Cough & Congestion Is Patient Diabetic? No Pain Assessment Patient in pain? no      Comments onset about 2 weeks ago, scratchy throat, nasal drainage clear in color,last week Tuesday  non- productive cough, take overt the counter Tussin and cold eez, with little relief. Head congestion,  denies temperature, has had hot flashes   Primary Care Provider:  DThomos Lemons DO  CC:  Cough & Congestion.  History of Present Illness: cough x 2 weeks ( 16 days ) started with scratchy throat, then runny nose now persistent cough feels like head cold, sneeze  Allergies (verified): No Known Drug Allergies  Past History:  Past Medical History: ATRIAL FIBRILLATION, PAROXYSMAL (ICD-427.31) CORONARY ARTERY DISEASE (ICD-414.00) CONGESTIVE HEART FAILURE (ICD-428.0) PACEMAKER (ICD-V45.Marland Kitchen01) COUMADIN THERAPY (ICD-V58.61)  HYPERTENSION (ICD-401.9)    HYPERLIPIDEMIA (ICD-272.4)   ABNORMAL LFT'S (ICD-794.8) UNSTEADY GAIT (ICD-781.2) MEMORY LOSS (ICD-780.93) RENAL INSUFFICIENCY, CHRONIC (ICD-585.9) ABNORMAL FINDINGS GI TRACT (ICD-793.4)  INGUINAL HERNIA, RIGHT (ICD-550.90) BARRETTS ESOPHAGUS (ICD-530.85) DIVERTICULOSIS OF COLON (ICD-562.10) G E R D (ICD-530.81) TENDINITIS, SHOULDER, RIGHT (ICD-726.10) ABDOMINAL PAIN, UNSPECIFIED SITE (ICD-789.00) EAR PAIN, LEFT (ICD-388.70) CONTUSION, RIGHT FOOT (ICD-924.20) TRANSAMINASES, SERUM, ELEVATED (ICD-790.4) ADENOCARCINOMA, PROSTATE, HX OF (ICD-V10.46)  Social History: Married   Supportive daugher Former Smoker -  quit tobacco 25 years ago  Alcohol use-yes  (small glass of wine)           Physical Exam  General:  alert, well-developed, and well-nourished.   Ears:  L ear normal.   Mouth:  pharynx pink and moist.   Lungs:  normal respiratory effort, slightly coarse breath sounds, no crackles, and no wheezes.   Heart:  normal rate, regular rhythm, and no gallop.     Impression & Recommendations:  Problem # 1:  BRONCHITIS (ICD-490)  His updated medication list for this problem includes:    Cefuroxime Axetil 500 Mg Tabs (Cefuroxime axetil) ..... One by mouth two times a day  Take antibiotics and other medications as directed. Encouraged to push clear liquids and get enough rest Patient advised to call office if symptoms persist or worsen.  pt advised to call coumadin clinic re:  abx use while on coumadin  Complete Medication List: 1)  [facility] Omeprazole 20 Mg Tbec (Omeprazole) .Marland Kitchen.. 1 by mouth once daily 2)  Lasix 80 Mg Tabs (Furosemide) .Marland Kitchen.. 1 by mouth once daily 3)  Metoprolol Tartrate 25 Mg Tabs (Metoprolol tartrate) .... By mouth two times a day 4)  Ra Aspirin 325 Mg Tabs (Aspirin) .... By mouth once daily 5)  Potassium Chloride Cr 10 Meq Tbcr (Potassium chloride) .... Take 1 tablet by mouth once a day 6)  Benazepril Hcl 5 Mg Tabs (Benazepril hcl) .... 1/2 by mouth once daily 7)  Fosamax 70 Mg Tabs (Alendronate sodium) .... One by mouth weekly 8)  Pravastatin Sodium 10 Mg Tabs (Pravastatin sodium) .... One by mouth qpm 9)  Warfarin Sodium 1 Mg Tabs (  Warfarin sodium) .... Use as directed by anticoagualtion clinic 10)  Nitrostat 0.4 Mg Subl (Nitroglycerin) .... Use as directed 11)  Zostavax 62130 Unt/0.54ml Solr (Zoster vaccine live) .... Administer vaccine x 1 12)  Cefuroxime Axetil 500 Mg Tabs (Cefuroxime axetil) .... One by mouth two times a day  Patient Instructions: 1)  Call our office if your symptoms do not  improve or gets worse. Prescriptions: CEFUROXIME AXETIL 500 MG TABS (CEFUROXIME AXETIL) one by mouth two times a day  #20 x  0   Entered and Authorized by:   D. Thomos Lemons DO   Signed by:   D. Thomos Lemons DO on 10/07/2010   Method used:   Electronically to        [facility] (retail)       [address]       [place], [place]  [address]       Ph: [phone]       Fax: [phone]   RxID:   [id number]    Orders Added: 1)  Est. Patient Level III [[address]]    Current Allergies (reviewed today): No known allergies

## 2010-12-10 NOTE — Letter (Signed)
Summary: Remote Device Check  [facility], Main Office  [address]   [place], [place] [address]   Phone: [phone]  Fax: [phone]     June 28, 2010 MRN: [id number]   [name] [address] [place], [place]  [address]   Dear Mr. Tisdell,   Your remote transmission was recieved and reviewed by your physician.  All diagnostics were within normal limits for you.  __X___Your next transmission is scheduled for:  09-19-2010.  Please transmit at any time this day.  If you have a wireless device your transmission will be sent automatically.    Sincerely,  [name]

## 2010-12-10 NOTE — Medication Information (Signed)
Summary: rov/ewj  Anticoagulant Therapy  Managed by: Cloyde Reams, RN, BSN Referring MD: Sharrell Ku PCP: [name] DO Supervising MD: Riley Kill MD, Maisie Fus Indication 1: Congestive Heart Failure (ICD-428.0) Lab Used: [facility] Site: [facility] INR POC 2.7 INR RANGE 2 - 3  Dietary changes: no    Health status changes: no    Bleeding/hemorrhagic complications: no    Recent/future hospitalizations: no    Any changes in medication regimen? no    Recent/future dental: no  Any missed doses?: no       Is patient compliant with meds? yes       Allergies (verified): No Known Drug Allergies  Anticoagulation Management History:      The patient is taking warfarin and comes in today for a routine follow up visit.  Positive risk factors for bleeding include an age of 75 years or older.  The bleeding index is 'intermediate risk'.  Positive CHADS2 values include History of CHF, History of HTN, and Age > 28 years old.  The start date was 10/11/2003.  His last INR was 3.0.  Anticoagulation responsible provider: Riley Kill MD, Maisie Fus.  INR POC: 2.7.  Cuvette Lot#: [id number].  Exp: 03/2011.    Anticoagulation Management Assessment/Plan:      The patient's current anticoagulation dose is Coumadin 1 mg tabs: Take as directed by coumadin clinic..  The target INR is 2.0-3.0.  The next INR is due 02/05/2010.  Anticoagulation instructions were given to patient.  Results were reviewed/authorized by Cloyde Reams, RN, BSN.  He was notified by Cloyde Reams RN.         Prior Anticoagulation Instructions: INR 2.6 Continue 2mg s everyday except 1mg s on Mondays and Fridays. Recheck in 4 weeks.   Current Anticoagulation Instructions: INR 2.7  Continue on same dosage 2mg  daily except 1mg  on Mondays and Fridays.  Recheck in 4 weeks.

## 2010-12-10 NOTE — Medication Information (Signed)
Summary: rov/tm  Anticoagulant Therapy  Managed by: Eda Keys, PharmD Referring MD: Sharrell Ku PCP: [name] DO Supervising MD: Tenny Craw MD, Gunnar Fusi Indication 1: Congestive Heart Failure (ICD-428.0) Lab Used: [facility] Site: [facility] INR POC 3.3 INR RANGE 2 - 3  Dietary changes: no    Health status changes: no    Bleeding/hemorrhagic complications: no    Recent/future hospitalizations: no    Any changes in medication regimen? no    Recent/future dental: no  Any missed doses?: no       Is patient compliant with meds? yes       Allergies: No Known Drug Allergies  Anticoagulation Management History:      The patient is taking warfarin and comes in today for a routine follow up visit.  Positive risk factors for bleeding include an age of 75 years or older.  The bleeding index is 'intermediate risk'.  Positive CHADS2 values include History of CHF, History of HTN, and Age > 46 years old.  The start date was 10/11/2003.  His last INR was 3.0.  Anticoagulation responsible provider: Tenny Craw MD, Gunnar Fusi.  INR POC: 3.3.  Cuvette Lot#: [id number].  Exp: 06/2011.    Anticoagulation Management Assessment/Plan:      The patient's current anticoagulation dose is Coumadin 1 mg tabs: 5 days per week take 2 mg by mouth and 1mg  2 days per week.  The target INR is 2.0-3.0.  The next INR is due 04/23/2010.  Anticoagulation instructions were given to patient.  Results were reviewed/authorized by Eda Keys, PharmD.  He was notified by Eda Keys.         Prior Anticoagulation Instructions: INR 3.3 Take 1mg s today then resume 2mg s daily except 1mg s on Mondays and Fridays. Recheck in 3 weeks.   Current Anticoagulation Instructions: INR 3.3  Do not take coumadin today.  Then return to normal dosing schedule of 1 tablet on Monday and Friday and 2 tablets all other days.  Return to clinic in 3 weeks.

## 2010-12-10 NOTE — Medication Information (Signed)
Summary: rov/cb  Anticoagulant Therapy  Managed by: Elaina Pattee, PharmD Referring MD: Sharrell Ku PCP: [name] DO Supervising MD: Riley Kill MD, Maisie Fus Indication 1: Congestive Heart Failure (ICD-428.0) Lab Used: [facility] Site: [facility] INR POC 2.8 INR RANGE 2 - 3  Dietary changes: no    Health status changes: yes       Details: Has ear infection, but is not on any oral antibiotics.  Bleeding/hemorrhagic complications: no    Recent/future hospitalizations: no    Any changes in medication regimen? no    Recent/future dental: no  Any missed doses?: no       Is patient compliant with meds? yes       Allergies: No Known Drug Allergies  Anticoagulation Management History:      The patient is taking warfarin and comes in today for a routine follow up visit.  Positive risk factors for bleeding include an age of 75 years or older.  The bleeding index is 'intermediate risk'.  Positive CHADS2 values include History of CHF, History of HTN, and Age > 36 years old.  The start date was 10/11/2003.  His last INR was 3.0.  Anticoagulation responsible provider: Riley Kill MD, Maisie Fus.  INR POC: 2.8.  Cuvette Lot#: [id number].  Exp: 06/2011.    Anticoagulation Management Assessment/Plan:      The patient's current anticoagulation dose is Coumadin 1 mg tabs: 5 days per week take 2 mg by mouth and 1mg  2 days per week.  The target INR is 2.0-3.0.  The next INR is due 05/24/2010.  Anticoagulation instructions were given to patient.  Results were reviewed/authorized by Elaina Pattee, PharmD.  He was notified by Elaina Pattee, PharmD.         Prior Anticoagulation Instructions: INR 3.5. Hold Coumadin today, then take 2 tablets daily except 1 tablet on Tues, Thurs, Sat.  Recheck in 2 weeks.  Current Anticoagulation Instructions: INR 2.8. Take 2 tablets daily except 1 tablet on Tues, Thurs, Sat. Recheck in 3 weeks.

## 2010-12-10 NOTE — Cardiovascular Report (Signed)
Summary: Office Visit Remote   Office Visit Remote   Imported By: Roderic Ovens 03/25/2010 15:26:27  _____________________________________________________________________  External Attachment:    Type:   Image     Comment:   External Document

## 2010-12-10 NOTE — Letter (Signed)
Summary: Remote Device Check  [facility], Main Office  [address]   [place], [place] [address]   Phone: [phone]  Fax: [phone]     October 02, 2010 MRN: [id number]   HEINRICH FERTIG [address] [place], [place]  [address]   Dear Mr. Shetterly,   Your remote transmission was recieved and reviewed by your physician.  All diagnostics were within normal limits for you.  __X___Your next transmission is scheduled for:  12-19-2010.  Please transmit at any time this day.  If you have a wireless device your transmission will be sent automatically.   Sincerely,  [name]

## 2010-12-10 NOTE — Medication Information (Signed)
Summary: rov/ewj  Anticoagulant Therapy  Managed by: Cloyde Reams, RN, BSN Referring MD: Sharrell Ku PCP: [name] DO Supervising MD: Gala Romney MD, Reuel Boom Indication 1: Congestive Heart Failure (ICD-428.0) Lab Used: [facility] Site: [facility] INR POC 2.5 INR RANGE 2 - 3  Dietary changes: no    Health status changes: no    Bleeding/hemorrhagic complications: no    Recent/future hospitalizations: no    Any changes in medication regimen? no    Recent/future dental: no  Any missed doses?: no       Is patient compliant with meds? yes       Allergies (verified): No Known Drug Allergies  Anticoagulation Management History:      The patient is taking warfarin and comes in today for a routine follow up visit.  Positive risk factors for bleeding include an age of 75 years or older.  The bleeding index is 'intermediate risk'.  Positive CHADS2 values include History of CHF, History of HTN, and Age > 59 years old.  The start date was 10/11/2003.  His last INR was 3.0.  Anticoagulation responsible provider: Raelynne Ludwick MD, Reuel Boom.  INR POC: 2.5.  Cuvette Lot#: [id number].  Exp: 03/2011.    Anticoagulation Management Assessment/Plan:      The patient's current anticoagulation dose is Coumadin 1 mg tabs: Take as directed by coumadin clinic..  The target INR is 2.0-3.0.  The next INR is due 03/05/2010.  Anticoagulation instructions were given to patient.  Results were reviewed/authorized by Cloyde Reams, RN, BSN.  He was notified by Cloyde Reams RN.         Prior Anticoagulation Instructions: INR 2.7  Continue on same dosage 2mg  daily except 1mg  on Mondays and Fridays.  Recheck in 4 weeks.    Current Anticoagulation Instructions: INR 2.5  Continue on same dosage 2 tablets daily except 1 tablet on Mondays and Fridays.  Recheck in 4 weeks.

## 2010-12-10 NOTE — Medication Information (Signed)
Summary: rov/cb  Anticoagulant Therapy  Managed by: Weston Brass, PharmD Referring MD: Sharrell Ku PCP: [name] DO Supervising MD: Riley Kill MD, Maisie Fus Indication 1: Congestive Heart Failure (ICD-428.0) Lab [facility]  Site: [facility] INR POC 3.0 INR RANGE 2 - 3  Dietary changes: no    Health status changes: no    Bleeding/hemorrhagic complications: no    Recent/future hospitalizations: no    Any changes in medication regimen? no    Recent/future dental: no  Any missed doses?: no       Is patient compliant with meds? yes       Allergies: No Known Drug Allergies  Anticoagulation Management History:      The patient is taking warfarin and comes in today for a routine follow up visit.  Positive risk factors for bleeding include an age of 39 years or older.  The bleeding index is 'intermediate risk'.  Positive CHADS2 values include History of CHF, History of HTN, and Age > 43 years old.  The start date was 10/11/2003.  His last INR was 3.0.  Anticoagulation responsible provider: Riley Kill MD, Maisie Fus.  INR POC: 3.0.  Cuvette Lot#: [id number].  Exp: 06/2011.    Anticoagulation Management Assessment/Plan:      The patient's current anticoagulation dose is Warfarin sodium 1 mg tabs: Use as directed by Anticoagualtion Clinic.  The target INR is 2.0-3.0.  The next INR is due 06/14/2010.  Anticoagulation instructions were given to patient.  Results were reviewed/authorized by Weston Brass, PharmD.  He was notified by Dillard Cannon.         Prior Anticoagulation Instructions: INR 2.8. Take 2 tablets daily except 1 tablet on Tues, Thurs, Sat. Recheck in 3 weeks.  Current Anticoagulation Instructions: INR 3.0  Take 1 tab on Tuesday, Thursday, and Saturday and 2 tabs all other days.  Re-check INR in 3 weeks.

## 2010-12-10 NOTE — Medication Information (Signed)
Summary: rov/tm  Anticoagulant Therapy  Managed by: Eda Keys, PharmD Referring MD: Sharrell Ku PCP: [name] DO Supervising MD: Clifton James MD, Cristal Deer Indication 1: Congestive Heart Failure (ICD-428.0) Lab Used: [facility] Site: [facility] INR POC 2.1 INR RANGE 2 - 3  Dietary changes: no    Health status changes: no    Bleeding/hemorrhagic complications: no    Recent/future hospitalizations: no    Any changes in medication regimen? no    Recent/future dental: no  Any missed doses?: yes     Details: missed one of his pills this past Sunday  Is patient compliant with meds? yes       Allergies: No Known Drug Allergies  Anticoagulation Management History:      Positive risk factors for bleeding include an age of 75 years or older.  The bleeding index is 'intermediate risk'.  Positive CHADS2 values include History of CHF, History of HTN, and Age > 31 years old.  The start date was 10/11/2003.  His last INR was 3.0.  Anticoagulation responsible provider: Clifton James MD, Cristal Deer.  INR POC: 2.1.  Cuvette Lot#: [id number].  Exp: 08/2011.    Anticoagulation Management Assessment/Plan:      The patient's current anticoagulation dose is Warfarin sodium 1 mg tabs: Use as directed by Anticoagualtion Clinic.  The target INR is 2.0-3.0.  The next INR is due 08/07/2010.  Anticoagulation instructions were given to patient.  Results were reviewed/authorized by Eda Keys, PharmD.  He was notified by Kennieth Francois.         Prior Anticoagulation Instructions: INR 2.6 Continue 1mg s everyday except 2mg s on Mondays, Wednesdays, and Fridays.  Recheck in 2 weeks.   Current Anticoagulation Instructions: INR 2.1  Continue taking one tablet every day except two tablets on Monday, Wednesday, and Friday.  We will see you in 3 weeks.

## 2010-12-10 NOTE — Cardiovascular Report (Signed)
Summary: Office Visit Remote   Office Visit Remote   Imported By: Roderic Ovens 10/14/2010 14:49:45  _____________________________________________________________________  External Attachment:    Type:   Image     Comment:   External Document

## 2010-12-10 NOTE — Letter (Signed)
Summary: Remote Device Check  [facility], Main Office  [address]   [place], [place] [address]   Phone: [phone]  Fax: [phone]     November 27, 2009 MRN: [id number]   [name] [address] [place], [place]  [address]   Dear [name],   Your remote transmission was recieved and reviewed by your physician.  All diagnostics were within normal limits for you.  __X___Your next transmission is scheduled for:   February 20, 2010.  Please transmit at any time this day.  If you have a wireless device your transmission will be sent automatically.     Sincerely,  Proofreader

## 2010-12-10 NOTE — Assessment & Plan Note (Signed)
Summary: 4 month follow up/mhf   Vital Signs:  Patient profile:   77 year old male Height:      66 inches Weight:      195.75 pounds O2 Sat:      97 % on Room air Temp:     97.7 degrees F oral Pulse rate:   65 / minute Pulse rhythm:   regular Resp:     18 per minute BP sitting:   104 / 60  (left arm) Cuff size:   large  Vitals Entered By: Glendell Docker CMA (March 04, 2010 2:23 PM)  O2 Flow:  Room air CC: Rm 2- 4 Month follow up disease management Comments refill on ntg   Primary Care Provider:  DThomos Lemons DO  CC:  Rm 2- 4 Month follow up disease management.  History of Present Illness: 77 y/o white male for follow up wife reports he has gained some wt enjoys desserts. he is less active since issues with carpal tunnel syndrome  no chest pain mild dyspnea  urology notes [name]  Allergies (verified): No Known Drug Allergies  Past History:  Past Medical History: ATRIAL FIBRILLATION, PAROXYSMAL (ICD-427.31) CORONARY ARTERY DISEASE (ICD-414.00) CONGESTIVE HEART FAILURE (ICD-428.0) PACEMAKER (ICD-V45.Marland Kitchen01) COUMADIN THERAPY (ICD-V58.61)  HYPERTENSION (ICD-401.9)   HYPERLIPIDEMIA (ICD-272.4)  ABNORMAL LFT'S (ICD-794.8) UNSTEADY GAIT (ICD-781.2) MEMORY LOSS (ICD-780.93) RENAL INSUFFICIENCY, CHRONIC (ICD-585.9) ABNORMAL FINDINGS GI TRACT (ICD-793.4) INGUINAL HERNIA, RIGHT (ICD-550.90) BARRETTS ESOPHAGUS (ICD-530.85) DIVERTICULOSIS OF COLON (ICD-562.10) G E R D (ICD-530.81) TENDINITIS, SHOULDER, RIGHT (ICD-726.10) ABDOMINAL PAIN, UNSPECIFIED SITE (ICD-789.00) EAR PAIN, LEFT (ICD-388.70) CONTUSION, RIGHT FOOT (ICD-924.20) TRANSAMINASES, SERUM, ELEVATED (ICD-790.4) ADENOCARCINOMA, PROSTATE, HX OF (ICD-V10.46)  Past Surgical History: Coronary Bypass Surgery Pacemaker     Hx of hernia repair age 77  (pt not sure if it was right or left side)      Social History: Married   Supportive daugher Former Smoker -  quit tobacco 25 years ago Alcohol use-yes  (small glass of wine)          Physical Exam  General:  alert, well-developed, and well-nourished.   Lungs:  normal respiratory effort and normal breath sounds.   Heart:  normal rate, regular rhythm, and no gallop.   Abdomen:  soft, non-tender, and normal bowel sounds.   Extremities:  trace left pedal edema and trace right pedal edema.   Neurologic:  ambulates with walker   Impression & Recommendations:  Problem # 1:  ADENOCARCINOMA, PROSTATE, HX OF (ICD-V10.46) followed by Dr. Isabel Caprice.  he is still receiving periodic lupron injections  Problem # 2:  HYPERTENSION (ICD-401.9) stable.  monitor bmet His updated medication list for this problem includes:    Lasix 80 Mg Tabs (Furosemide) .Marland Kitchen... 1 by mouth once daily    Metoprolol Tartrate 25 Mg Tabs (Metoprolol tartrate) ..... By mouth two times a day    Benazepril Hcl 5 Mg Tabs (Benazepril hcl) .Marland Kitchen... 1/2 by mouth once daily  Orders: T-Basic Metabolic Panel [phone])  BP today: 104/60 Prior BP: 100/60 (10/26/2009)  Labs [name]: K+: 4.0 (2020-03-1909) Creat: : 1.4 (2020-03-1909)   Chol: 171 (2020-03-1909)   HDL: 36.3 (2020-03-1909)   LDL: 107 (2020-03-1909)   TG: 140 (2020-03-1909)  Problem # 3:  WEIGHT GAIN (ICD-783.1) I doubt wt gain from volume overload.  pt enjoying desserts.  pt advised to follow sensible diet Orders: T-BNP  (B Natriuretic Peptide) ([id number])  Complete Medication List: 1)  Cvs Omeprazole 20 Mg Tbec (Omeprazole) .Marland Kitchen.. 1 by mouth once daily 2)  Lasix  80 Mg Tabs (Furosemide) .Marland Kitchen.. 1 by mouth once daily 3)  Metoprolol Tartrate 25 Mg Tabs (Metoprolol tartrate) .... By mouth two times a day 4)  Ra Aspirin 325 Mg Tabs (Aspirin) .... By mouth once daily 5)  Potassium Chloride Cr 10 Meq Tbcr (Potassium chloride) .... Take 1 tablet by mouth once a day 6)  Benazepril Hcl 5 Mg Tabs (Benazepril hcl) .... 1/2 by mouth once daily 7)  Fosamax 70 Mg Tabs (Alendronate sodium) .... One by mouth weekly 8)  Pravastatin Sodium 10  Mg Tabs (Pravastatin sodium) .... One by mouth qpm 9)  Coumadin 1 Mg Tabs (Warfarin sodium) .... 5 days per week take 2 mg by mouth and 1mg  2 days per week 10)  Voltaren 1 % Gel (Diclofenac sodium) .... Use 2 gm  qid 11)  Nitrostat 0.4 Mg Subl (Nitroglycerin) .... Use as directed  Other Orders: T-Hepatic Function [phone]) T-Lipid Profile [phone]) T-TSH [phone]) T-CBC w/Diff [phone])  Patient Instructions: 1)  Please schedule a follow-up appointment in 4 months. Prescriptions: NITROSTAT 0.4 MG SUBL (NITROGLYCERIN) use as directed  #1 bottle x 3   Entered and Authorized by:   D. Thomos Lemons DO   Signed by:   D. Thomos Lemons DO on 03/04/2010   Method used:   Electronically to        [facility] (retail)       [address]       [place], [place]  [address]       Ph: [phone]       Fax: [phone]   RxID:   [id number]   Current Allergies ([name] today): No known allergies

## 2010-12-10 NOTE — Progress Notes (Signed)
Summary: transmitter issues  Phone Note Call from Patient Call back at Home Phone [phone]   Caller: Patient Reason for Call: Talk to Nurse Summary of Call: Patient is still having issues with the transmitter.  They have called the company and machine will still not transmit.  They have checked the batteries, which are working appropriately.  They do not want to call the company again.   Initial call taken by: Burnard Leigh,  November 13, 2009 2:26 PM  Follow-up for Phone Call        Spoke with daughter, she will have [name] speak with [place] when he comes in on 1/12 for his coumadin check and see if he wants to be followed in office instead of with remotes. Follow-up by: Altha Harm, LPN,  November 14, 2009 8:05 AM

## 2010-12-10 NOTE — Progress Notes (Signed)
Summary: want to know if office got transmission  Phone Note Call from Patient Call back at Home Phone [phone]   Caller: Mom Summary of Call: Pt calling regarding transmission of pacemaker Initial call taken by: Judie Grieve,  June 11, 2010 2:42 PM  Follow-up for Phone Call        Re-instructed patient on [employer] transmission.  He will attempt to send again. Follow-up by: Altha Harm, LPN,  June 11, 2010 4:54 PM

## 2010-12-10 NOTE — Letter (Signed)
   [place] at [facility] [address] [place], [place]  [address]  [place] Phone: [phone]      June 27, 2010   [facility] [address] [place], [place] [address]  RE:  LAB RESULTS  Dear  Mr. Armas,  The following is an interpretation of your most recent lab tests.  Please take note of any instructions provided or changes to medications that have resulted from your lab work.  ELECTROLYTES:  Good - no changes needed  KIDNEY FUNCTION TESTS:  Good - no changes needed  LIVER FUNCTION TESTS:  Good - no changes needed    CPK (muscle enzyme) - normal       Sincerely Yours,    Dr. Thomos Lemons

## 2010-12-10 NOTE — Medication Information (Signed)
Summary: rov/sel  Anticoagulant Therapy  Managed by: Reina Fuse, PharmD Referring MD: Sharrell Ku PCP: [name] DO Supervising MD: Gala Romney MD, Reuel Boom Indication 1: Congestive Heart Failure (ICD-428.0) Lab Used: [facility] Site: [facility] INR POC 2.4 INR RANGE 2 - 3  Dietary changes: no    Health status changes: no    Bleeding/hemorrhagic complications: no    Recent/future hospitalizations: no    Any changes in medication regimen? no    Recent/future dental: no  Any missed doses?: no       Is patient compliant with meds? yes       Allergies: No Known Drug Allergies  Anticoagulation Management History:      The patient is taking warfarin and comes in today for a routine follow up visit.  Positive risk factors for bleeding include an age of 75 years or older.  The bleeding index is 'intermediate risk'.  Positive CHADS2 values include History of CHF, History of HTN, and Age > 75 years old.  The start date was 10/11/2003.  His last INR was 3.0.  Anticoagulation responsible provider: Bensimhon MD, Reuel Boom.  INR POC: 2.4.  Cuvette Lot#: [id number].  Exp: 09/2011.    Anticoagulation Management Assessment/Plan:      The patient's current anticoagulation dose is Warfarin sodium 1 mg tabs: Use as directed by Anticoagualtion Clinic.  The target INR is 2.0-3.0.  The next INR is due 10/30/2010.  Anticoagulation instructions were given to patient.  Results were reviewed/authorized by Reina Fuse, PharmD.  He was notified by Reina Fuse PharmD.         Prior Anticoagulation Instructions: INR 2.2  Continue taking same dose of 1 tablet everyday except 2 tablets on Sunday, Tuesday, and Thursday.  Recheck in 4 weeks.   Current Anticoagulation Instructions: INR 2.4  Continue taking Coumadin 2 tabs (2 mg) on Sun, Tues, Thur and Coumadin 1 tab (1 mg) on Mon, Wed, Fri, Sat. Return to clinic in 4 weeks.

## 2010-12-10 NOTE — Medication Information (Signed)
Summary: rov/sp  Anticoagulant Therapy  Managed by: Elaina Pattee, PharmD Referring MD: Sharrell Ku PCP: [name] DO Supervising MD: Clifton James MD, Cristal Deer Indication 1: Congestive Heart Failure (ICD-428.0) Lab Used: [facility] Site: [address] INR POC 3.5 INR RANGE 2 - 3  Dietary changes: no    Health status changes: no    Bleeding/hemorrhagic complications: no    Recent/future hospitalizations: no    Any changes in medication regimen? no    Recent/future dental: no  Any missed doses?: no       Is patient compliant with meds? yes       Allergies: No Known Drug Allergies  Anticoagulation Management History:      The patient is taking warfarin and comes in today for a routine follow up visit.  Positive risk factors for bleeding include an age of 75 years or older.  The bleeding index is 'intermediate risk'.  Positive CHADS2 values include History of CHF, History of HTN, and Age > 64 years old.  The start date was 10/11/2003.  His last INR was 3.0.  Anticoagulation responsible provider: Clifton James MD, Cristal Deer.  INR POC: 3.5.  Cuvette Lot#: [id number].  Exp: 06/2011.    Anticoagulation Management Assessment/Plan:      The patient's current anticoagulation dose is Coumadin 1 mg tabs: 5 days per week take 2 mg by mouth and 1mg  2 days per week.  The target INR is 2.0-3.0.  The next INR is due 05/03/2010.  Anticoagulation instructions were given to patient.  Results were reviewed/authorized by Elaina Pattee, PharmD.  He was notified by Elaina Pattee, PharmD.         Prior Anticoagulation Instructions: INR 3.3  Do not take coumadin today.  Then return to normal dosing schedule of 1 tablet on Monday and Friday and 2 tablets all other days.  Return to clinic in 3 weeks.    Current Anticoagulation Instructions: INR 3.5. Hold Coumadin today, then take 2 tablets daily except 1 tablet on Tues, Thurs, Sat.  Recheck in 2 weeks.

## 2010-12-10 NOTE — Medication Information (Signed)
Summary: rov/ln  Anticoagulant Therapy  Managed by: Weston Brass, PharmD Referring MD: Sharrell Ku PCP: [name] DO Supervising MD: Excell Seltzer MD, Casimiro Needle Indication 1: Congestive Heart Failure (ICD-428.0) Lab Used: [facility] Site: [facility] INR POC 3.4 INR RANGE 2 - 3  Dietary changes: no    Health status changes: no    Bleeding/hemorrhagic complications: no    Recent/future hospitalizations: no    Any changes in medication regimen? no    Recent/future dental: no  Any missed doses?: no       Is patient compliant with meds? yes       Allergies: No Known Drug Allergies  Anticoagulation Management History:      The patient is taking warfarin and comes in today for a routine follow up visit.  Positive risk factors for bleeding include an age of 75 years or older.  The bleeding index is 'intermediate risk'.  Positive CHADS2 values include History of CHF, History of HTN, and Age > 37 years old.  The start date was 10/11/2003.  His last INR was 3.0.  Anticoagulation responsible Nedda Gains: Excell Seltzer MD, Casimiro Needle.  INR POC: 3.4.  Cuvette Lot#: [id number].  Exp: 08/2011.    Anticoagulation Management Assessment/Plan:      The patient's current anticoagulation dose is Warfarin sodium 1 mg tabs: Use as directed by Anticoagualtion Clinic.  The target INR is 2.0-3.0.  The next INR is due 06/28/2010.  Anticoagulation instructions were given to patient.  Results were reviewed/authorized by Weston Brass, PharmD.  He was notified by Weston Brass PharmD.         Prior Anticoagulation Instructions: INR 3.0  Take 1 tab on Tuesday, Thursday, and Saturday and 2 tabs all other days.  Re-check INR in 3 weeks.  Current Anticoagulation Instructions: INR 3.4  Skip today's dose of Coumadin then decrease dose to 1 tablet every day except 2 tablets on Monday, Wednesday and Friday.

## 2010-12-10 NOTE — Letter (Signed)
Summary: [facility]  [facility]   Imported By: Lanelle Bal 06/21/2010 12:27:57  _____________________________________________________________________  External Attachment:    Type:   Image     Comment:   External Document

## 2010-12-10 NOTE — Cardiovascular Report (Signed)
Summary: Office Visit   Office Visit   Imported By: Roderic Ovens 08/22/2010 15:20:16  _____________________________________________________________________  External Attachment:    Type:   Image     Comment:   External Document

## 2010-12-10 NOTE — Assessment & Plan Note (Signed)
Summary: 4 month follow up/mhf   Vital Signs:  Patient profile:   77 year old male Height:      66 inches Weight:      197 pounds BMI:     31.91 O2 Sat:      95 % on Room air Temp:     97.7 degrees F oral Pulse rate:   71 / minute Pulse rhythm:   regular Resp:     22 per minute BP sitting:   98 / 50  (left arm) Cuff size:   large  Vitals Entered By: Glendell Docker CMA (June 25, 2010 2:08 PM)  O2 Flow:  Room air CC: 4 Month Follow up  Is Patient Diabetic? No Pain Assessment Patient in pain? no      Comments c/o increase diffculty with walking and standing    Primary Care Javiana Anwar:  [name] DO  CC:  4 Month Follow up .  History of Present Illness: 77 y/o white malef for f/u since prev visit having increasing difficulty walking ankles are more painful  he was evaluated at [facility] - PT new walker he has difficulty with getting up and down ramps   Preventive Screening-Counseling & Management  Alcohol-Tobacco     Smoking Status: quit  Allergies (verified): No Known Drug Allergies  Past History:  Past Medical History: ATRIAL FIBRILLATION, PAROXYSMAL (ICD-427.31) CORONARY ARTERY DISEASE (ICD-414.00) CONGESTIVE HEART FAILURE (ICD-428.0) PACEMAKER (ICD-V45.Marland Kitchen01) COUMADIN THERAPY (ICD-V58.61)  HYPERTENSION (ICD-401.9)   HYPERLIPIDEMIA (ICD-272.4)  ABNORMAL LFT'S (ICD-794.8) UNSTEADY GAIT (ICD-781.2) MEMORY LOSS (ICD-780.93) RENAL INSUFFICIENCY, CHRONIC (ICD-585.9) ABNORMAL FINDINGS GI TRACT (ICD-793.4)  INGUINAL HERNIA, RIGHT (ICD-550.90) BARRETTS ESOPHAGUS (ICD-530.85) DIVERTICULOSIS OF COLON (ICD-562.10) G E R D (ICD-530.81) TENDINITIS, SHOULDER, RIGHT (ICD-726.10) ABDOMINAL PAIN, UNSPECIFIED SITE (ICD-789.00) EAR PAIN, LEFT (ICD-388.70) CONTUSION, RIGHT FOOT (ICD-924.20) TRANSAMINASES, SERUM, ELEVATED (ICD-790.4) ADENOCARCINOMA, PROSTATE, HX OF (ICD-V10.46)  Social History: Married   Supportive daugher Former Smoker -  quit tobacco 25 years  ago Alcohol use-yes (small glass of wine)           Physical Exam  General:  alert, well-developed, and well-nourished.   Lungs:  normal respiratory effort and normal breath sounds.   Heart:  normal rate, regular rhythm, and no gallop.   Extremities:  trace left pedal edema and trace right pedal edema.   Neurologic:  ambulates with walker   Impression & Recommendations:  Problem # 1:  WEAKNESS (ICD-780.79) 77 y/o chronic debility getting somewhat weaker.  rule out medication effect. continue home exercises  Orders: T-Basic Metabolic Panel [phone])  Problem # 2:  HYPERTENSION (ICD-401.9) Assessment: Unchanged  His updated medication list for this problem includes:    Lasix 80 Mg Tabs (Furosemide) .Marland Kitchen... 1 by mouth once daily    Metoprolol Tartrate 25 Mg Tabs (Metoprolol tartrate) ..... By mouth two times a day    Benazepril Hcl 5 Mg Tabs (Benazepril hcl) .Marland Kitchen... 1/2 by mouth once daily  BP today: 98/50 Prior BP: 110/50 (04/23/2010)  Labs Reviewed: K+: 4.3 (03/04/2010) Creat: : 1.25 (03/04/2010)   Chol: 194 (03/04/2010)   HDL: 33 (03/04/2010)   LDL: 107 (03/04/2010)   TG: 272 (03/04/2010)  Problem # 3:  HYPERLIPIDEMIA (ICD-272.4)  His updated medication list for this problem includes:    Pravastatin Sodium 10 Mg Tabs (Pravastatin sodium) ..... One by mouth qpm  Orders: T-Hepatic Function [phone]) T-CK Total ([phone])  Complete Medication List: 1)  Cvs Omeprazole 20 Mg Tbec (Omeprazole) .Marland Kitchen.. 1 by mouth once daily 2)  Lasix 80  Mg Tabs (Furosemide) .Marland Kitchen.. 1 by mouth once daily 3)  Metoprolol Tartrate 25 Mg Tabs (Metoprolol tartrate) .... By mouth two times a day 4)  Ra Aspirin 325 Mg Tabs (Aspirin) .... By mouth once daily 5)  Potassium Chloride Cr 10 Meq Tbcr (Potassium chloride) .... Take 1 tablet by mouth once a day 6)  Benazepril Hcl 5 Mg Tabs (Benazepril hcl) .... 1/2 by mouth once daily 7)  Fosamax 70 Mg Tabs (Alendronate sodium) .... One by mouth  weekly 8)  Pravastatin Sodium 10 Mg Tabs (Pravastatin sodium) .... One by mouth qpm 9)  Warfarin Sodium 1 Mg Tabs (Warfarin sodium) .... Use as directed by anticoagualtion clinic 10)  Nitrostat 0.4 Mg Subl (Nitroglycerin) .... Use as directed 11)  Zostavax [id number] Unt/0.14ml Solr (Zoster vaccine live) .... Administer vaccine x 1  Patient Instructions: 1)  Please schedule a follow-up appointment in 6 months. Prescriptions: ZOSTAVAX [id number] UNT/0.65ML SOLR (ZOSTER VACCINE LIVE) administer vaccine x 1  #1 x 0   Entered and Authorized by:   D. Thomos Lemons DO   Signed by:   D. Thomos Lemons DO on 06/25/2010   Method used:   Print then Give to Patient   RxID:   [id number]   Current Allergies (reviewed today): No known allergies

## 2010-12-10 NOTE — Assessment & Plan Note (Signed)
Summary: per check out/saf   Visit Type:  6 mo f/u Primary Provider:  [name] DO  CC:  no cardiac complaints today.  History of Present Illness: [name] returns for evaluation and management of his multiple cardiac and vascular problems.  He has a very positive attitude and other than being tired and having hard time getting up offers no complaints. Specifically as orthopnea, PND, increased edema, palpitations, or chest pain.  He is very compliant with his medications and his diet. He remains on Coumadin. He has not fallen. He denies any melena.  Current Medications (verified): 1)  Cvs Omeprazole 20 Mg  Tbec (Omeprazole) .Marland Kitchen.. 1 By Mouth Once Daily 2)  Lasix 80 Mg  Tabs (Furosemide) .Marland Kitchen.. 1 By Mouth Once Daily 3)  Metoprolol Tartrate 25 Mg  Tabs (Metoprolol Tartrate) .... By Mouth Two Times A Day 4)  Ra Aspirin 325 Mg  Tabs (Aspirin) .... By Mouth Once Daily 5)  Potassium Chloride Cr 10 Meq  Tbcr (Potassium Chloride) .... Take 1 Tablet By Mouth Once A Day 6)  Benazepril Hcl 5 Mg  Tabs (Benazepril Hcl) .... 1/2 By Mouth Once Daily 7)  Fosamax 70 Mg  Tabs (Alendronate Sodium) .... One By Mouth Weekly 8)  Pravastatin Sodium 10 Mg Tabs (Pravastatin Sodium) .... One By Mouth Qpm 9)  Coumadin 1 Mg Tabs (Warfarin Sodium) .... 5 Days Per Week Take 2 Mg By Mouth and 1mg  2 Days Per Week 10)  Nitrostat 0.4 Mg Subl (Nitroglycerin) .... Use As Directed  Allergies (verified): No Known Drug Allergies  Past History:  Past Medical History: Last updated: 03/04/2010 ATRIAL FIBRILLATION, PAROXYSMAL (ICD-427.31) CORONARY ARTERY DISEASE (ICD-414.00) CONGESTIVE HEART FAILURE (ICD-428.0) PACEMAKER (ICD-V45.Marland Kitchen01) COUMADIN THERAPY (ICD-V58.61)  HYPERTENSION (ICD-401.9)   HYPERLIPIDEMIA (ICD-272.4)  ABNORMAL LFT'S (ICD-794.8) UNSTEADY GAIT (ICD-781.2) MEMORY LOSS (ICD-780.93) RENAL INSUFFICIENCY, CHRONIC (ICD-585.9) ABNORMAL FINDINGS GI TRACT (ICD-793.4) INGUINAL HERNIA, RIGHT  (ICD-550.90) BARRETTS ESOPHAGUS (ICD-530.85) DIVERTICULOSIS OF COLON (ICD-562.10) G E R D (ICD-530.81) TENDINITIS, SHOULDER, RIGHT (ICD-726.10) ABDOMINAL PAIN, UNSPECIFIED SITE (ICD-789.00) EAR PAIN, LEFT (ICD-388.70) CONTUSION, RIGHT FOOT (ICD-924.20) TRANSAMINASES, SERUM, ELEVATED (ICD-790.4) ADENOCARCINOMA, PROSTATE, HX OF (ICD-V10.46)  Past Surgical History: Last updated: 03/04/2010 Coronary Bypass Surgery Pacemaker     Hx of hernia repair age 61  (pt not sure if it was right or left side)      Social History: Last updated: 03/04/2010 Married   Supportive daugher Former Smoker -  quit tobacco 25 years ago Alcohol use-yes (small glass of wine)          Risk Factors: Smoking Status: quit (10/26/2009)  Review of Systems       negative other than history of present illness  Vital Signs:  Patient profile:   77 year old male Height:      66 inches Weight:      193 pounds BMI:     31.26 Pulse rate:   73 / minute Pulse rhythm:   regular BP sitting:   110 / 50  (left arm) Cuff size:   large  Vitals Entered By: Danielle Rankin, CMA (April 23, 2010 2:33 PM)  Physical Exam  General:  elderly, in no acute distress Head:  normocephalic and atraumatic Eyes:  wears glasses Neck:  Neck supple, no JVD. No masses, thyromegaly or abnormal cervical nodes. Chest Laray Rivkin:  no deformities or breast masses noted Lungs:  Clear bilaterally to auscultation and percussion. Heart:  PMI displaced inferolaterally, regular rate and rhythm, soft S1 soft systolic murmur at the apex Abdomen:  Bowel sounds positive; abdomen soft and non-tender without masses, organomegaly, or hernias noted. No hepatosplenomegaly. Msk:  decreased ROM.   Pulses:  diminished but present the lower extremities Extremities:  support hose,no significant edema Neurologic:  Alert and oriented x 3. Skin:  Intact without lesions or rashes. Psych:  always positive, great attitude   EKG  Procedure date:   04/23/2010  Findings:      atrial pacing, T wave inversion anterolaterally, no acute changes.  PPM Specifications Following MD:  Paul Bunting, MD     PPM Vendor:  [employer]onic     PPM Model Number:  [id number]     PPM Serial Number:  [id number] PPM DOI:  10/09/2003     PPM Implanting MD:  Paul Bunting, MD  Lead 1    Location: [facility]     DOI: 10/09/2003     Model #: 1914     Serial #: [id number]     Status: active Lead 2    Location: [facility]     DOI: 10/09/2003     Model #: 0865     Serial #: [id number]     Status: active  Magnet Response Rate:  BOL 85 ERI 65  Indications:  INTERMITTENT CHB, SINUS BRADY   PPM Follow Up Pacer Dependent:  No      Episodes Coumadin:  Yes  Parameters Mode:  DDDR     Lower Rate Limit:  60     Upper Rate Limit:  130 Paced AV Delay:  300     Sensed AV Delay:  280 Rate Response Parameters:  ADL response-3, Exertion response-3, Threshold-Med/Low, Acceleration-30 seconds, Deceleration-Exercise  Impression & Recommendations:  Problem # 1:  CORONARY ARTERY DISEASE (ICD-414.00)  His updated medication list for this problem includes:    Metoprolol Tartrate 25 Mg Tabs (Metoprolol tartrate) ..... By mouth two times a day    Ra Aspirin 325 Mg Tabs (Aspirin) ..... By mouth once daily    Benazepril Hcl 5 Mg Tabs (Benazepril hcl) .Marland Kitchen... 1/2 by mouth once daily    Coumadin 1 Mg Tabs (Warfarin sodium) .Marland KitchenMarland KitchenMarland KitchenMarland Kitchen 5 days per week take 2 mg by mouth and 1mg  2 days per week    Nitrostat 0.4 Mg Subl (Nitroglycerin) ..... Use as directed  Orders: EKG w/ Interpretation (93000)  Problem # 2:  OLD MYOCARDIAL INFARCTION (ICD-412) Assessment: Unchanged  His updated medication list for this problem includes:    Metoprolol Tartrate 25 Mg Tabs (Metoprolol tartrate) ..... By mouth two times a day    Ra Aspirin 325 Mg Tabs (Aspirin) ..... By mouth once daily    Benazepril Hcl 5 Mg Tabs (Benazepril hcl) .Marland Kitchen... 1/2 by mouth once daily    Coumadin 1 Mg Tabs (Warfarin sodium) .Marland KitchenMarland KitchenMarland KitchenMarland Kitchen 5 days per week  take 2 mg by mouth and 1mg  2 days per week    Nitrostat 0.4 Mg Subl (Nitroglycerin) ..... Use as directed  Problem # 3:  ATRIAL FIBRILLATION, PAROXYSMAL (ICD-427.31) Assessment: Unchanged  His updated medication list for this problem includes:    Metoprolol Tartrate 25 Mg Tabs (Metoprolol tartrate) ..... By mouth two times a day    Ra Aspirin 325 Mg Tabs (Aspirin) ..... By mouth once daily    Coumadin 1 Mg Tabs (Warfarin sodium) .Marland KitchenMarland KitchenMarland KitchenMarland Kitchen 5 days per week take 2 mg by mouth and 1mg  2 days per week  Orders: EKG w/ Interpretation (93000)  Problem # 4:  CONGESTIVE HEART FAILURE (ICD-428.0) Assessment: Improved  His updated medication list for this problem includes:  Lasix 80 Mg Tabs (Furosemide) .Marland Kitchen... 1 by mouth once daily    Metoprolol Tartrate 25 Mg Tabs (Metoprolol tartrate) ..... By mouth two times a day    Ra Aspirin 325 Mg Tabs (Aspirin) ..... By mouth once daily    Benazepril Hcl 5 Mg Tabs (Benazepril hcl) .Marland Kitchen... 1/2 by mouth once daily    Coumadin 1 Mg Tabs (Warfarin sodium) .Marland KitchenMarland KitchenMarland KitchenMarland Kitchen 5 days per week take 2 mg by mouth and 1mg  2 days per week    Nitrostat 0.4 Mg Subl (Nitroglycerin) ..... Use as directed  Problem # 5:  PACEMAKER (ICD-V45.Marland Kitchen01) Assessment: Unchanged  Problem # 6:  COUMADIN THERAPY (ICD-V58.61) Assessment: Unchanged  Problem # 7:  HYPERTENSION (ICD-401.9) Assessment: Improved  His updated medication list for this problem includes:    Lasix 80 Mg Tabs (Furosemide) .Marland Kitchen... 1 by mouth once daily    Metoprolol Tartrate 25 Mg Tabs (Metoprolol tartrate) ..... By mouth two times a day    Ra Aspirin 325 Mg Tabs (Aspirin) ..... By mouth once daily    Benazepril Hcl 5 Mg Tabs (Benazepril hcl) .Marland Kitchen... 1/2 by mouth once daily  Problem # 8:  HYPERLIPIDEMIA (ICD-272.4)  His updated medication list for this problem includes:    Pravastatin Sodium 10 Mg Tabs (Pravastatin sodium) ..... One by mouth qpm  Patient Instructions: 1)  Your physician recommends that you schedule a  follow-up appointment in: 6 months with Dr. Daleen Squibb. 2)  Your physician recommends that you continue on your current medications as directed. Please refer to the Current Medication list given to you today.

## 2010-12-10 NOTE — Miscellaneous (Signed)
Summary: dx correction  Clinical Lists Changes  Problems: Changed problem from PACEMAKER (ICD-V45..01) to PACEMAKER, PERMANENT (ICD-V45.01)  changed the incorrect dx code to correct dx code 

## 2010-12-10 NOTE — Assessment & Plan Note (Signed)
Summary: DEVICE/SAF   Visit Type:  Follow-up Primary Provider:  [name] DO   History of Present Illness: [name] returns today for followup.  He is a pleasant elderly man with a h/o symptomatic bradycardia and PAF and CAD.  He denies c/p, palpitation, or syncope.  He has class 2 CHF symptoms but is limited to his wheelchair.  He is still working on [employer].  Current Medications (verified): 1)  Cvs Omeprazole 20 Mg  Tbec (Omeprazole) .Marland Kitchen.. 1 By Mouth Once Daily 2)  Lasix 80 Mg  Tabs (Furosemide) .Marland Kitchen.. 1 By Mouth Once Daily 3)  Metoprolol Tartrate 25 Mg  Tabs (Metoprolol Tartrate) .... By Mouth Two Times A Day 4)  Ra Aspirin 325 Mg  Tabs (Aspirin) .... By Mouth Once Daily 5)  Potassium Chloride Cr 10 Meq  Tbcr (Potassium Chloride) .... Take 1 Tablet By Mouth Once A Day 6)  Benazepril Hcl 5 Mg  Tabs (Benazepril Hcl) .... 1/2 By Mouth Once Daily 7)  Fosamax 70 Mg  Tabs (Alendronate Sodium) .... One By Mouth Weekly 8)  Pravastatin Sodium 10 Mg Tabs (Pravastatin Sodium) .... One By Mouth Qpm 9)  Warfarin Sodium 1 Mg Tabs (Warfarin Sodium) .... Use As Directed By Anticoagualtion Clinic 10)  Nitrostat 0.4 Mg Subl (Nitroglycerin) .... Use As Directed 11)  Zostavax 41324 Unt/0.47ml Solr (Zoster Vaccine Live) .... Administer Vaccine X 1  Allergies (verified): No Known Drug Allergies  Past History:  Past Medical History: Last updated: 08/13/2010 ATRIAL FIBRILLATION, PAROXYSMAL (ICD-427.31) CORONARY ARTERY DISEASE (ICD-414.00) CONGESTIVE HEART FAILURE (ICD-428.0) PACEMAKER (ICD-V45.Marland Kitchen01) COUMADIN THERAPY (ICD-V58.61)  HYPERTENSION (ICD-401.9)    HYPERLIPIDEMIA (ICD-272.4)  ABNORMAL LFT'S (ICD-794.8) UNSTEADY GAIT (ICD-781.2) MEMORY LOSS (ICD-780.93) RENAL INSUFFICIENCY, CHRONIC (ICD-585.9) ABNORMAL FINDINGS GI TRACT (ICD-793.4)  INGUINAL HERNIA, RIGHT (ICD-550.90) BARRETTS ESOPHAGUS (ICD-530.85) DIVERTICULOSIS OF COLON (ICD-562.10) G E R D (ICD-530.81) TENDINITIS, SHOULDER,  RIGHT (ICD-726.10) ABDOMINAL PAIN, UNSPECIFIED SITE (ICD-789.00) EAR PAIN, LEFT (ICD-388.70) CONTUSION, RIGHT FOOT (ICD-924.20) TRANSAMINASES, SERUM, ELEVATED (ICD-790.4) ADENOCARCINOMA, PROSTATE, HX OF (ICD-V10.46)  Past Surgical History: Last updated: 08/13/2010 Coronary Bypass Surgery Pacemaker     Hx of hernia repair age 28  (pt not sure if it was right or left side)       Review of Systems       The patient complains of dyspnea on exertion and peripheral edema.  The patient denies chest pain and syncope.    Vital Signs:  Patient profile:   77 year old male Height:      66 inches Weight:      198 pounds BMI:     32.07 Pulse rate:   88 / minute BP sitting:   92 / 48  (left arm)  Vitals Entered By: Laurance Flatten CMA (August 20, 2010 2:03 PM)  Physical Exam  General:  pleasant 77 y/o , overweight appearing Head:  normocephalic and atraumatic Eyes:  wears glasses Mouth:  Teeth, gums and palate normal. Oral mucosa normal. Neck:  Neck supple, no JVD. No masses, thyromegaly or abnormal cervical nodes. Lungs:  normal respiratory effort and normal breath sounds.   Heart:  normal rate, regular rhythm, and no gallop.   Abdomen:  Bowel sounds positive; abdomen soft and non-tender without masses, organomegaly, or hernias noted. No hepatosplenomegaly. Msk:  mild ankle swelling bilaterally, no redness over joints and no joint instability.   Pulses:  diminished but present the lower extremities Extremities:  trace left pedal edema and trace right pedal edema.   Neurologic:  ambulates with walker  PPM Specifications Following MD:  [name], MD     PPM Vendor:  [employer]     PPM Model Number:  [id number]     PPM Serial Number:  [id number] PPM DOI:  10/09/2003     PPM Implanting MD:  [name], MD  Lead 1    Location: [facility]     DOI: 10/09/2003     Model #: 1914     Serial #: [id number]     Status: active Lead 2    Location: [facility]     DOI: 10/09/2003     Model #: 0865     Serial #:  [id number]     Status: active  Magnet Response Rate:  BOL 85 ERI 65  Indications:  INTERMITTENT CHB, SINUS BRADY   PPM Follow Up Battery Voltage:  2.74 V     Battery Est. Longevity:  2.5 yrs     Pacer Dependent:  No       PPM Device Measurements Atrium  Amplitude: 2.80 mV, Impedance: 499 ohms, Threshold: 1.00 V at 0.40 msec Right Ventricle  Amplitude: 15.68 mV, Impedance: 588 ohms, Threshold: 1.00 V at 0.40 msec  Episodes MS Episodes:  0     Coumadin:  Yes Ventricular High Rate:  0     Atrial Pacing:  42.4%     Ventricular Pacing:  34.5%  Parameters Mode:  DDDR     Lower Rate Limit:  60     Upper Rate Limit:  130 Paced AV Delay:  300     Sensed AV Delay:  280 Rate Response Parameters:  ADL response-3, Exertion response-3, Threshold-Med/Low, Acceleration-30 seconds, Deceleration-Exercise Next Remote Date:  11/21/2010     Next Cardiology Appt Due:  08/11/2011 Tech Comments:  NORMAL DEVICE FUNCTION.  NO EPISODES SINCE LAST CHECK.  NO CHANGES MADE. CARELINK 11-21-10. ROV IN 12 MTHS W/GT. [name] MD Comments:  Agree with above.  Impression & Recommendations:  Problem # 1:  PACEMAKER (ICD-V45.Marland Kitchen01) His device is working normally.  will recheck in several months.  Problem # 2:  ATRIAL FIBRILLATION, PAROXYSMAL (ICD-427.31) He appears to be maintaining NSR  Continue current meds. His updated medication list for this problem includes:    Metoprolol Tartrate 25 Mg Tabs (Metoprolol tartrate) ..... By mouth two times a day    Ra Aspirin 325 Mg Tabs (Aspirin) ..... By mouth once daily    Warfarin Sodium 1 Mg Tabs (Warfarin sodium) ..... Use as directed by anticoagualtion clinic  Problem # 3:  CONGESTIVE HEART FAILURE (ICD-428.0) His CHF symptoms are well controlled.  He will continue his meds as below and maintain a low sodium diet. His updated medication list for this problem includes:    Lasix 80 Mg Tabs (Furosemide) .Marland Kitchen... 1 by mouth once daily    Metoprolol Tartrate 25 Mg Tabs  (Metoprolol tartrate) ..... By mouth two times a day    Ra Aspirin 325 Mg Tabs (Aspirin) ..... By mouth once daily    Benazepril Hcl 5 Mg Tabs (Benazepril hcl) .Marland Kitchen... 1/2 by mouth once daily    Warfarin Sodium 1 Mg Tabs (Warfarin sodium) ..... Use as directed by anticoagualtion clinic    Nitrostat 0.4 Mg Subl (Nitroglycerin) ..... Use as directed  Patient Instructions: 1)  Your physician wants you to follow-up in: 12 months with Dr Court Joy will receive a reminder letter in the mail two months in advance. If you don't receive a letter, please call our office to schedule the follow-up appointment. 2)  [facility] transmission on 11/21/10

## 2010-12-10 NOTE — Medication Information (Signed)
Summary: rov/tm  Anticoagulant Therapy  Managed by: Bethena Midget, RN, BSN Referring MD: Sharrell Ku PCP: [name] DO Supervising MD: Juanda Chance MD, Shalah Estelle Indication 1: Congestive Heart Failure (ICD-428.0) Lab Used: [facility] Site: [address] INR POC 2.6 INR RANGE 2 - 3  Dietary changes: no    Health status changes: no    Bleeding/hemorrhagic complications: no    Recent/future hospitalizations: no    Any changes in medication regimen? no    Recent/future dental: no  Any missed doses?: no       Is patient compliant with meds? yes       Allergies: No Known Drug Allergies  Anticoagulation Management History:      The patient is taking warfarin and comes in today for a routine follow up visit.  Positive risk factors for bleeding include an age of 75 years or older.  The bleeding index is 'intermediate risk'.  Positive CHADS2 values include History of CHF, History of HTN, and Age > 54 years old.  The start date was 10/11/2003.  His last INR was 3.0.  Anticoagulation responsible provider: Juanda Chance MD, Smitty Cords.  INR POC: 2.6.  Cuvette Lot#: [id number].  Exp: 02/2011.    Anticoagulation Management Assessment/Plan:      The patient's current anticoagulation dose is Coumadin 1 mg tabs: Take as directed by coumadin clinic..  The target INR is 2.0-3.0.  The next INR is due 01/08/2010.  Anticoagulation instructions were given to patient.  Results were reviewed/authorized by Bethena Midget, RN, BSN.  He was notified by Bethena Midget, RN, BSN.         Prior Anticoagulation Instructions: INR 2.1 Change dose to 2mg s everyday except 1mg s on Mondays and Fridays. Recheck in 3 weeks.   Current Anticoagulation Instructions: INR 2.6 Continue 2mg s everyday except 1mg s on Mondays and Fridays. Recheck in 4 weeks.

## 2010-12-10 NOTE — Medication Information (Signed)
Summary: rov/ewj  Anticoagulant Therapy  Managed by: Bethena Midget, RN, BSN Referring MD: Sharrell Ku PCP: [name] DO Supervising MD: Eden Emms MD, Theron Arista Indication 1: Congestive Heart Failure (ICD-428.0) Lab Used: [facility] Site: [address] INR POC 3.3 INR RANGE 2 - 3  Dietary changes: no    Health status changes: no    Bleeding/hemorrhagic complications: no    Recent/future hospitalizations: no    Any changes in medication regimen? no    Recent/future dental: no  Any missed doses?: no       Is patient compliant with meds? yes      Comments: Saw Dr [name] yesterday.   Allergies: No Known Drug Allergies  Anticoagulation Management History:      The patient is taking warfarin and comes in today for a routine follow up visit.  Positive risk factors for bleeding include an age of 32 years or older.  The bleeding index is 'intermediate risk'.  Positive CHADS2 values include History of CHF, History of HTN, and Age > 68 years old.  The start date was 10/11/2003.  His last INR was 3.0.  Anticoagulation responsible provider: Eden Emms MD, Theron Arista.  INR POC: 3.3.  Cuvette Lot#: [id number].  Exp: 04/2011.    Anticoagulation Management Assessment/Plan:      The patient's current anticoagulation dose is Coumadin 1 mg tabs: 5 days per week take 2 mg by mouth and 1mg  2 days per week.  The target INR is 2.0-3.0.  The next INR is due 03/26/2010.  Anticoagulation instructions were given to patient.  Results were reviewed/authorized by Bethena Midget, RN, BSN.  He was notified by Bethena Midget, RN, BSN.         Prior Anticoagulation Instructions: INR 2.5  Continue on same dosage 2 tablets daily except 1 tablet on Mondays and Fridays.  Recheck in 4 weeks.    Current Anticoagulation Instructions: INR 3.3 Take 1mg s today then resume 2mg s daily except 1mg s on Mondays and Fridays. Recheck in 3 weeks.

## 2010-12-10 NOTE — Letter (Signed)
   [place] [facility] [address] [place], [place]  [address]  [place] Phone: [phone]      March 07, 2010   [facility] [address] [place], [place] [address]  RE:  LAB RESULTS  Dear  Mr. Enwright,  The following is an interpretation of your most recent lab tests.  Please take note of any instructions provided or changes to medications that have resulted from your lab work.  ELECTROLYTES:  Good - no changes needed  KIDNEY FUNCTION TESTS:  Good - no changes needed  LIVER FUNCTION TESTS:  Good - no changes needed  LIPID PANEL:  Fair - review at your next visit Triglyceride: 272   Cholesterol: 194   LDL: 107   HDL: 33   Chol/HDL%:  5.9 Ratio  THYROID STUDIES:  Thyroid studies normal TSH: 4.027     CBC:  Good - no changes needed        Sincerely Yours,    Dr. Thomos Lemons

## 2010-12-10 NOTE — Letter (Signed)
Summary: Remote Device Check  [employer], Main Office  [address]   [place], [place] [address]   Phone: [phone]  Fax: [phone]     Mar 21, 2010 MRN: [id number]   HAMDAN TOSCANO [address] [place], [place]  [address]   Dear Mr. Flythe,   Your remote transmission was recieved and reviewed by your physician.  All diagnostics were within normal limits for you.  __X___Your next transmission is scheduled for:   05-30-2010.  Please transmit at any time this day.  If you have a wireless device your transmission will be sent automatically.   Sincerely,  [name]

## 2010-12-10 NOTE — Cardiovascular Report (Signed)
Summary: Office Visit Remote   Office Visit Remote   Imported By: Roderic Ovens 07/03/2010 14:57:58  _____________________________________________________________________  External Attachment:    Type:   Image     Comment:   External Document

## 2010-12-10 NOTE — Progress Notes (Signed)
Summary: Call/TP  Phone Note Call from Patient   Caller: Patient Call For: CVRR Details for Reason: New med Summary of Call: Pt called to report starting cefuroxime 500 mg two times a day for the next 10 days.  We will not make any changes to coumadin dose at this time.   Initial call taken by: Geoffry Paradise, PharmD

## 2010-12-10 NOTE — Assessment & Plan Note (Signed)
Summary: ankle pain/hea   Vital Signs:  Patient profile:   77 year old male Weight:      198.75 pounds BMI:     32.20 O2 Sat:      97 % on Room air Temp:     97.7 degrees F oral Pulse rate:   63 / minute Pulse rhythm:   regular Resp:     18 per minute BP sitting:   100 / 50  (right arm) Cuff size:   large  Vitals Entered By: Glendell Docker CMA (August 13, 2010 2:04 PM)  O2 Flow:  Room air  Primary Care Provider:  D. Thomos Lemons DO  CC:  Ankle Pain and Lipid Management.  History of Present Illness: 77 y/o with hx chronic ankle pain reports exacerbation in discomfort  sharp pain around ankel at night - " a shot of pain at night" when he turns prev Dr. Doristine Section (podiatrist) gave him  steroid shot which helped told he has no more cartilage in his ankles    he has been using new ankle support with velcro straps for last 4-5 wks  CC: Ankle Pain, Lipid Management Is Patient Diabetic? No Pain Assessment Patient in pain? yes     Location: ankle Intensity: 7 Type: sharp Onset of pain  Constant Comments c/o bilateral ankle pain, he would like to know if he could get shots, shoot pain around ankles and up leg with walking and pressure     Preventive Screening-Counseling & Management  Alcohol-Tobacco     Smoking Status: quit  Allergies (verified): No Known Drug Allergies  Past History:  Past Medical History: ATRIAL FIBRILLATION, PAROXYSMAL (ICD-427.31) CORONARY ARTERY DISEASE (ICD-414.00) CONGESTIVE HEART FAILURE (ICD-428.0) PACEMAKER (ICD-V45.Marland Kitchen01) COUMADIN THERAPY (ICD-V58.61)  HYPERTENSION (ICD-401.9)    HYPERLIPIDEMIA (ICD-272.4)  ABNORMAL LFT'S (ICD-794.8) UNSTEADY GAIT (ICD-781.2) MEMORY LOSS (ICD-780.93) RENAL INSUFFICIENCY, CHRONIC (ICD-585.9) ABNORMAL FINDINGS GI TRACT (ICD-793.4)  INGUINAL HERNIA, RIGHT (ICD-550.90) BARRETTS ESOPHAGUS (ICD-530.85) DIVERTICULOSIS OF COLON (ICD-562.10) G E R D (ICD-530.81) TENDINITIS, SHOULDER, RIGHT  (ICD-726.10) ABDOMINAL PAIN, UNSPECIFIED SITE (ICD-789.00) EAR PAIN, LEFT (ICD-388.70) CONTUSION, RIGHT FOOT (ICD-924.20) TRANSAMINASES, SERUM, ELEVATED (ICD-790.4) ADENOCARCINOMA, PROSTATE, HX OF (ICD-V10.46)  Past Surgical History: Coronary Bypass Surgery Pacemaker     Hx of hernia repair age 59  (pt not sure if it was right or left side)       Physical Exam  General:  pleasant 77 y/o , overweight appearing Lungs:  normal respiratory effort and normal breath sounds.   Heart:  normal rate, regular rhythm, and no gallop.   Msk:  mild ankle swelling bilaterally, no redness over joints and no joint instability.     Impression & Recommendations:  Problem # 1:  ANKLE PAIN, BILATERAL (ICD-719.47) 77 y/o with hx of chronic djd of bilateral ankle c/o bilateral ankle pain.  refer to Dr. Victorino Dike to seen if cortisone injection indicated.  defer x rays to Dr. Victorino Dike.   Orders: Orthopedic Referral (Ortho)  Problem # 2:  OSTEOPOROSIS (ICD-733.00)  His updated medication list for this problem includes:    Fosamax 70 Mg Tabs (Alendronate sodium) ..... One by mouth weekly  Complete Medication List: 1)  Cvs Omeprazole 20 Mg Tbec (Omeprazole) .Marland Kitchen.. 1 by mouth once daily 2)  Lasix 80 Mg Tabs (Furosemide) .Marland Kitchen.. 1 by mouth once daily 3)  Metoprolol Tartrate 25 Mg Tabs (Metoprolol tartrate) .... By mouth two times a day 4)  Ra Aspirin 325 Mg Tabs (Aspirin) .... By mouth once daily 5)  Potassium Chloride  Cr 10 Meq Tbcr (Potassium chloride) .... Take 1 tablet by mouth once a day 6)  Benazepril Hcl 5 Mg Tabs (Benazepril hcl) .... 1/2 by mouth once daily 7)  Fosamax 70 Mg Tabs (Alendronate sodium) .... One by mouth weekly 8)  Pravastatin Sodium 10 Mg Tabs (Pravastatin sodium) .... One by mouth qpm 9)  Warfarin Sodium 1 Mg Tabs (Warfarin sodium) .... Use as directed by anticoagualtion clinic 10)  Nitrostat 0.4 Mg Subl (Nitroglycerin) .... Use as directed 11)  Zostavax [address] Unt/0.5ml Solr (Zoster  vaccine live) .... Administer vaccine x 1  Patient Instructions: 1)  Our office will contact you re:  orthopedic referral  Current Allergies (reviewed today): No known allergies    Immunization History:  Influenza Immunization History:    Influenza:  historical (08/12/2010)

## 2010-12-10 NOTE — Letter (Signed)
Summary: Device-Delinquent Phone Journalist, newspaper, Main Office  [address]   [place], [place] [address]   Phone: [phone]  Fax: [phone]     May 31, 2010 MRN: [id number]   [name] [address] [place], [place]  [address]   Dear [name],  According to our records, you were scheduled for a device phone transmission on                              .   05/30/10  We did not receive any results from this check.  If you transmitted on your scheduled day, please call us to help troubleshoot your system.  If you forgot to send your transmission, please send one upon receipt of this letter.  Thank you,  Altha Harm, LPN  May 31, 2010 4:50 PM  [facility]

## 2010-12-10 NOTE — Medication Information (Signed)
Summary: cvrr  Anticoagulant Therapy  Managed by: Bethena Midget, RN, BSN Referring MD: Sharrell Ku PCP: [name] DO Supervising MD: Ladona Ridgel MD, Sharlot Gowda Indication 1: Congestive Heart Failure (ICD-428.0) Lab Used: [facility] Site: [address] INR POC 2.1 INR RANGE 2 - 3  Dietary changes: no     Bleeding/hemorrhagic complications: no    Recent/future hospitalizations: no    Any changes in medication regimen? no    Recent/future dental: no  Any missed doses?: no       Is patient compliant with meds? yes       Allergies: No Known Drug Allergies  Anticoagulation Management History:      The patient is taking warfarin and comes in today for a routine follow up visit.  Positive risk factors for bleeding include an age of 75 years or older.  The bleeding index is 'intermediate risk'.  Positive CHADS2 values include History of CHF, History of HTN, and Age > 69 years old.  The start date was 10/11/2003.  His last INR was 3.0.  Anticoagulation responsible provider: Ladona Ridgel MD, Sharlot Gowda.  INR POC: 2.1.  Cuvette Lot#: [id number].  Exp: 02/2011.    Anticoagulation Management Assessment/Plan:      The patient's current anticoagulation dose is Coumadin 1 mg tabs: Take as directed by coumadin clinic..  The target INR is 2.0-3.0.  The next INR is due 12/13/2009.  Anticoagulation instructions were given to patient.  Results were reviewed/authorized by Bethena Midget, RN, BSN.  He was notified by Bethena Midget, RN, BSN.         Prior Anticoagulation Instructions: INR = 3.0  The patient's dosage of coumadin will be decreased.  The new dosage includes: Take 1 tablet on MWF and 2 tablets on all other days.   Current Anticoagulation Instructions: INR 2.1 Change dose to 2mg s everyday except 1mg s on Mondays and Fridays. Recheck in 3 weeks.

## 2010-12-10 NOTE — Progress Notes (Signed)
Summary: Benazepril Refill  Phone Note Refill Request Message from:  Fax from Pharmacy on December 25, 2009 1:14 PM  Refills Requested: Medication #1:  COUMADIN 1 MG TABS Take as directed by coumadin clinic.   Dosage confirmed as above?Dosage Confirmed   Brand Name Necessary? No   Supply Requested: 3 months   Last Refilled: 09/14/2009  Medication #2:  BENAZEPRIL HCL 5 MG  TABS 1/2 by mouth once daily   Dosage confirmed as above?Dosage Confirmed   Brand Name Necessary? No   Supply Requested: 3 months  Method Requested: Electronic Next Appointment Scheduled: 01-08-10 COUMADIN  Initial call taken by: Roselle Locus,  December 25, 2009 1:15 PM  Follow-up for Phone Call        refill x 3 Follow-up by: D. Thomos Lemons DO,  December 25, 2009 5:23 PM  Additional Follow-up for Phone Call Additional follow up Details #1::        Refill submitted for Benazepril to pharmacy, patient advised to contact coumadin clinic for refill on Coumadin Additional Follow-up by: Glendell Docker CMA,  December 26, 2009 8:52 AM    Prescriptions: BENAZEPRIL HCL 5 MG  TABS (BENAZEPRIL HCL) 1/2 by mouth once daily  #45 x 3   Entered by:   Glendell Docker CMA   Authorized by:   D. Thomos Lemons DO   Signed by:   Glendell Docker CMA on 12/26/2009   Method used:   Electronically to        [facility] (retail)       [address]       [place], [place]  [address]       Ph: [phone]       Fax: [phone]   RxID:   [id number]

## 2010-12-10 NOTE — Progress Notes (Signed)
Summary: REFILL  Phone Note Refill Request Message from:  Patient on December 26, 2009 9:44 AM  Refills Requested: Medication #1:  COUMADIN 1 MG TABS Take as directed by coumadin clinic. SEND TO [facility] [phone]  Initial call taken by: Judie Grieve,  December 26, 2009 9:44 AM  Follow-up for Phone Call        Called spoke with pt advised rx requested sent to pharmacy electronically.   Follow-up by: Cloyde Reams RN,  December 26, 2009 4:25 PM    Prescriptions: COUMADIN 1 MG TABS (WARFARIN SODIUM) Take as directed by coumadin clinic.  #60 x 3   Entered by:   Cloyde Reams RN   Authorized by:   Gaylord Shih, MD, [facility]   Signed by:   Cloyde Reams RN on 12/26/2009   Method used:   Electronically to        [facility] (retail)       [address]       [place], [place]  [address]       Ph: [phone]       Fax: [phone]   RxID:   [id number]

## 2010-12-10 NOTE — Progress Notes (Signed)
Summary: Ankle Pain  Phone Note Call from Patient Call back at Home Phone [phone]   Caller: Patient Call For: D. Thomos Lemons DO Summary of Call: patient called and left voice message regarding scheduling appointment for a shot for pain in his ankles Initial call taken by: Glendell Docker CMA,  August 12, 2010 4:45 PM  Follow-up for Phone Call        call was returned to patient at [phone], he states he will need appointment for evaluation for the pain that he is having in his ankles.  patient was offered appointment for 10 @ 2:15 with Dr [name], he wanted to know if he could  reschedule his wifes appointment to the same day. He was informed that I would send him to appointments inorder that he and his wife could schedule a follow up together Follow-up by: Glendell Docker CMA,  August 12, 2010 4:52 PM

## 2010-12-10 NOTE — Cardiovascular Report (Signed)
Summary: Office Visit Remote   Office Visit Remote   Imported By: Roderic Ovens 11/28/2009 12:07:20  _____________________________________________________________________  External Attachment:    Type:   Image     Comment:   External Document

## 2010-12-10 NOTE — Progress Notes (Signed)
Summary: refill request  Phone Note Refill Request Message from:  Fax from Pharmacy on November 12, 2009 9:00 AM  Refills Requested: Medication #1:  POTASSIUM CHLORIDE CR 10 MEQ  TBCR Take 1 tablet by mouth once a day   Dosage confirmed as above?Dosage Confirmed   Brand Name Necessary? No   Supply Requested: 3 months  Method Requested: Electronic Next Appointment Scheduled: 11-21-09 9:15 coumadin Initial call taken by: Roselle Locus,  November 12, 2009 9:00 AM  Follow-up for Phone Call        Patient was just seen recently and is to return in 4 months, refilled prescription. Follow-up by: Lucious Groves,  November 12, 2009 4:39 PM  Additional Follow-up for Phone Call Additional follow up Details #1::        refill x 5 Additional Follow-up by: D. Thomos Lemons DO,  November 12, 2009 4:43 PM    Prescriptions: POTASSIUM CHLORIDE CR 10 MEQ  TBCR (POTASSIUM CHLORIDE) Take 1 tablet by mouth once a day  #90 x 3   Entered by:   Lucious Groves   Authorized by:   D. Thomos Lemons DO   Signed by:   Lucious Groves on 11/12/2009   Method used:   Electronically to        [facility] (retail)       [address]       [place], [place]  [address]       Ph: [phone]       Fax: [phone]   RxID:   [id number]

## 2010-12-10 NOTE — Medication Information (Signed)
Summary: rov/tm  Anticoagulant Therapy  Managed by: Bethena Midget, RN, BSN Referring MD: Sharrell Ku PCP: [name] DO Supervising MD: Clifton James MD, Cristal Deer Indication 1: Congestive Heart Failure (ICD-428.0) Lab Used: [facility] Site: [address] INR POC 2.6 INR RANGE 2 - 3  Dietary changes: no    Health status changes: no    Bleeding/hemorrhagic complications: no    Recent/future hospitalizations: no    Any changes in medication regimen? no    Recent/future dental: no  Any missed doses?: no       Is patient compliant with meds? yes       Allergies: No Known Drug Allergies  Anticoagulation Management History:      The patient is taking warfarin and comes in today for a routine follow up visit.  Positive risk factors for bleeding include an age of 75 years or older.  The bleeding index is 'intermediate risk'.  Positive CHADS2 values include History of CHF, History of HTN, and Age > 75 years old.  The start date was 10/11/2003.  His last INR was 3.0.  Anticoagulation responsible Johntay Doolen: Clifton James MD, Cristal Deer.  INR POC: 2.6.  Cuvette Lot#: [id number].  Exp: 08/2011.    Anticoagulation Management Assessment/Plan:      The patient's current anticoagulation dose is Warfarin sodium 1 mg tabs: Use as directed by Anticoagualtion Clinic.  The target INR is 2.0-3.0.  The next INR is due 07/17/2010.  Anticoagulation instructions were given to patient.  Results were reviewed/authorized by Bethena Midget, RN, BSN.  He was notified by Bethena Midget, RN, BSN.         Prior Anticoagulation Instructions: INR 3.4  Skip today's dose of Coumadin then decrease dose to 1 tablet every day except 2 tablets on Monday, Wednesday and Friday.   Current Anticoagulation Instructions: INR 2.6 Continue 1mg s everyday except 2mg s on Mondays, Wednesdays, and Fridays.  Recheck in 2 weeks.

## 2010-12-10 NOTE — Medication Information (Signed)
Summary: [name]  Anticoagulant Therapy  Managed by: Weston Brass, PharmD Referring MD: Sharrell Ku PCP: [name] DO Supervising MD: Ladona Ridgel MD, Sharlot Gowda Indication 1: Congestive Heart Failure (ICD-428.0) Lab Used: [facility] Site: [facility] INR POC 2.2 INR RANGE 2 - 3  Dietary changes: no    Health status changes: no    Bleeding/hemorrhagic complications: no    Recent/future hospitalizations: no    Any changes in medication regimen? no    Recent/future dental: no  Any missed doses?: no       Is patient compliant with meds? yes       Allergies: No Known Drug Allergies  Anticoagulation Management History:      The patient is taking warfarin and comes in today for a routine follow up visit.  Positive risk factors for bleeding include an age of 47 years or older.  The bleeding index is 'intermediate risk'.  Positive CHADS2 values include History of CHF, History of HTN, and Age > 51 years old.  The start date was 10/11/2003.  His last INR was 3.0.  Anticoagulation responsible provider: Ladona Ridgel MD, Sharlot Gowda.  INR POC: 2.2.  Cuvette Lot#: [id number].  Exp: 09/2011.    Anticoagulation Management Assessment/Plan:      The patient's current anticoagulation dose is Warfarin sodium 1 mg tabs: Use as directed by Anticoagualtion Clinic.  The target INR is 2.0-3.0.  The next INR is due 09/04/2010.  Anticoagulation instructions were given to patient.  Results were reviewed/authorized by Weston Brass, PharmD.  He was notified by Weston Brass PharmD.         Prior Anticoagulation Instructions: INR 2.1  Continue taking one tablet every day except two tablets on Monday, Wednesday, and Friday.  We will see you in 3 weeks.  Current Anticoagulation Instructions: INR 2.2  Continue same dose of 1 tablet every day except 2 tablets on Monday, Wednesday and Friday.   Recheck INR in 4 weeks.

## 2010-12-12 NOTE — Letter (Signed)
Summary: [facility]  [facility]   Imported By: Lanelle Bal 11/07/2010 15:01:20  _____________________________________________________________________  External Attachment:    Type:   Image     Comment:   External Document

## 2010-12-12 NOTE — Progress Notes (Signed)
Summary: Medication refill  Phone Note Refill Request Call back at Home Phone [phone] Message from:  Patient on December 04, 2010 4:31 PM  Refills Requested: Medication #1:  METOPROLOL TARTRATE 25 MG  TABS by mouth two times a day   Dosage confirmed as above?Dosage Confirmed   Brand Name Necessary? No   Supply Requested: 1 month  Medication #2:  furosemide 40 mg take 2 tab by mouth in th am   Dosage confirmed as above?Dosage Confirmed   Brand Name Necessary? No   Supply Requested: 1 month He normally gerts his meds from the [place]   they will no longer fill these two  please send 30 day supply to [facility] [address]    Method Requested: Electronic Next Appointment Scheduled: 12-19-10 card Initial call taken by: Roselle Locus,  December 04, 2010 4:33 PM  Follow-up for Phone Call        Rx completed in Dr. Tiajuana Amass Follow-up by: Glendell Docker CMA,  December 05, 2010 8:56 AM    Prescriptions: LASIX 80 MG  TABS (FUROSEMIDE) 1 by mouth once daily  #30 x 3   Entered by:   Glendell Docker CMA   Authorized by:   D. Thomos Lemons DO   Signed by:   Glendell Docker CMA on 12/05/2010   Method used:   Electronically to        [facility] (retail)       [address]       [place], [place]  [address]       Ph: [phone]       Fax: [phone]   RxID:   [id number] METOPROLOL TARTRATE 25 MG  TABS (METOPROLOL TARTRATE) by mouth two times a day  #60 x 3   Entered by:   Glendell Docker CMA   Authorized by:   D. Thomos Lemons DO   Signed by:   Glendell Docker CMA on 12/05/2010   Method used:   Electronically to        [facility] (retail)       [address]       [place], [place]  [address]       Ph: [phone]       Fax: [phone]   RxID:   [id number]

## 2010-12-12 NOTE — Medication Information (Signed)
Summary: rov/tm  Anticoagulant Therapy  Managed by: Bethena Midget, RN, BSN Referring MD: Sharrell Ku PCP: [name] DO Supervising MD: Shirlee Latch MD, Taryn Shellhammer Indication 1: Congestive Heart Failure (ICD-428.0) Lab Used: [facility] Site: [facility] INR POC 2.3 INR RANGE 2 - 3  Dietary changes: no    Health status changes: no    Bleeding/hemorrhagic complications: no    Recent/future hospitalizations: no    Any changes in medication regimen? no    Recent/future dental: no  Any missed doses?: no       Is patient compliant with meds? yes       Allergies: No Known Drug Allergies  Anticoagulation Management History:      The patient is taking warfarin and comes in today for a routine follow up visit.  Positive risk factors for bleeding include an age of 75 years or older.  The bleeding index is 'intermediate risk'.  Positive CHADS2 values include History of CHF, History of HTN, and Age > 43 years old.  The start date was 10/11/2003.  His last INR was 3.0.  Anticoagulation responsible provider: Shirlee Latch MD, Caedyn Raygoza.  INR POC: 2.3.  Cuvette Lot#: [id number].  Exp: 12/2011.    Anticoagulation Management Assessment/Plan:      The patient's current anticoagulation dose is Warfarin sodium 1 mg tabs: Use as directed by Anticoagualtion Clinic.  The target INR is 2.0-3.0.  The next INR is due 12/25/2010.  Anticoagulation instructions were given to patient.  Results were reviewed/authorized by Bethena Midget, RN, BSN.  He was notified by Linward Headland, PharmD candidate.         Prior Anticoagulation Instructions: INR 2.8 Continue 1mg s everyday except 2mg s on Sundays, Tuesdays and Thursdays. Recheck in 4 weeks.   Current Anticoagulation Instructions: INR 2.3 (INR goal: 2-3)  Take 1 tablet everyday except 2 tablets on Sundays, Tuesdays, and Thursdays.  Recheck in 4 weeks.

## 2010-12-12 NOTE — Medication Information (Signed)
Summary: rov/sl  Anticoagulant Therapy  Managed by: Bethena Midget, RN, BSN Referring MD: Sharrell Ku PCP: [name] DO Supervising MD: Shirlee Latch MD, [place] Indication 1: Congestive Heart Failure (ICD-428.0) Lab Used: [facility] Site: [facility] INR POC 2.8 INR RANGE 2 - 3  Dietary changes: no    Health status changes: no    Bleeding/hemorrhagic complications: no    Recent/future hospitalizations: no    Any changes in medication regimen? no    Recent/future dental: no  Any missed doses?: no       Is patient compliant with meds? yes       Allergies: No Known Drug Allergies  Anticoagulation Management History:      The patient is taking warfarin and comes in today for a routine follow up visit.  Positive risk factors for bleeding include an age of 75 years or older.  The bleeding index is 'intermediate risk'.  Positive CHADS2 values include History of CHF, History of HTN, and Age > 68 years old.  The start date was 10/11/2003.  His last INR was 3.0.  Anticoagulation responsible provider: Shirlee Latch MD, [place].  INR POC: 2.8.  Cuvette Lot#: [id number].  Exp: 12/2011.    Anticoagulation Management Assessment/Plan:      The patient's current anticoagulation dose is Warfarin sodium 1 mg tabs: Use as directed by Anticoagualtion Clinic.  The target INR is 2.0-3.0.  The next INR is due 11/27/2010.  Anticoagulation instructions were given to patient.  Results were reviewed/authorized by Bethena Midget, RN, BSN.  He was notified by Bethena Midget, RN, BSN.         Prior Anticoagulation Instructions: INR 2.4  Continue taking Coumadin 2 tabs (2 mg) on Sun, Tues, Thur and Coumadin 1 tab (1 mg) on Mon, Wed, Fri, Sat. Return to clinic in 4 weeks.   Current Anticoagulation Instructions: INR 2.8 Continue 1mg s everyday except 2mg s on Sundays, Tuesdays and Thursdays. Recheck in 4 weeks.

## 2010-12-12 NOTE — Assessment & Plan Note (Signed)
Summary: f34m/dfg   Visit Type:  6 mo f/u Primary Provider:  [name] DO  CC:  pt states he gets every once in awhile a little twinge at the pacemaker site...denies any other complaints today.  History of Present Illness: [name] comes in today for followup and management of his cardiovascular problems.  He is doing remarkably well. His weight has been stable. He denies orthopnea PND or edema. He still gets around rather well with his walker. Laboratory data being checked by primary care. He is extremely compliant with his medications. He has not had to use nitroglycerin.  He remains on Coumadin. He has no complaint of melena or bleeding.  Current Medications (verified): 1)  Cvs Omeprazole 20 Mg  Tbec (Omeprazole) .Marland Kitchen.. 1 By Mouth Once Daily 2)  Lasix 80 Mg  Tabs (Furosemide) .Marland Kitchen.. 1 By Mouth Once Daily 3)  Metoprolol Tartrate 25 Mg  Tabs (Metoprolol Tartrate) .... By Mouth Two Times A Day 4)  Aspirin Ec 325 Mg Tbec (Aspirin) .... Take One Tablet By Mouth Daily 5)  Potassium Chloride Cr 10 Meq  Tbcr (Potassium Chloride) .... Take 1 Tablet By Mouth Once A Day 6)  Benazepril Hcl 5 Mg  Tabs (Benazepril Hcl) .... 1/2 By Mouth Once Daily 7)  Fosamax 70 Mg  Tabs (Alendronate Sodium) .... One By Mouth Weekly 8)  Pravastatin Sodium 10 Mg Tabs (Pravastatin Sodium) .... One By Mouth Qpm 9)  Warfarin Sodium 1 Mg Tabs (Warfarin Sodium) .... Use As Directed By Anticoagualtion Clinic 10)  Nitrostat 0.4 Mg Subl (Nitroglycerin) .... Use As Directed 11)  Zostavax 69629 Unt/0.19ml Solr (Zoster Vaccine Live) .... Administer Vaccine X 1  Allergies (verified): No Known Drug Allergies  Past History:  Past Medical History: Last updated: 10/07/2010 ATRIAL FIBRILLATION, PAROXYSMAL (ICD-427.31) CORONARY ARTERY DISEASE (ICD-414.00) CONGESTIVE HEART FAILURE (ICD-428.0) PACEMAKER (ICD-V45.Marland Kitchen01) COUMADIN THERAPY (ICD-V58.61)  HYPERTENSION (ICD-401.9)    HYPERLIPIDEMIA (ICD-272.4)   ABNORMAL LFT'S  (ICD-794.8) UNSTEADY GAIT (ICD-781.2) MEMORY LOSS (ICD-780.93) RENAL INSUFFICIENCY, CHRONIC (ICD-585.9) ABNORMAL FINDINGS GI TRACT (ICD-793.4)  INGUINAL HERNIA, RIGHT (ICD-550.90) BARRETTS ESOPHAGUS (ICD-530.85) DIVERTICULOSIS OF COLON (ICD-562.10) G E R D (ICD-530.81) TENDINITIS, SHOULDER, RIGHT (ICD-726.10) ABDOMINAL PAIN, UNSPECIFIED SITE (ICD-789.00) EAR PAIN, LEFT (ICD-388.70) CONTUSION, RIGHT FOOT (ICD-924.20) TRANSAMINASES, SERUM, ELEVATED (ICD-790.4) ADENOCARCINOMA, PROSTATE, HX OF (ICD-V10.46)  Past Surgical History: Last updated: 08/13/2010 Coronary Bypass Surgery Pacemaker     Hx of hernia repair age 27  (pt not sure if it was right or left side)       Social History: Last updated: 10/07/2010 Married   Supportive daugher Former Smoker -  quit tobacco 25 years ago  Alcohol use-yes (small glass of wine)           Risk Factors: Smoking Status: quit (08/13/2010)  Review of Systems       negative other than history of present illness  Vital Signs:  Patient profile:   77 year old male Height:      66 inches Weight:      200 pounds BMI:     32.40 Pulse rate:   78 / minute Pulse rhythm:   regular BP sitting:   100 / 48  (left arm) Cuff size:   large  Vitals Entered By: Danielle Rankin, CMA (October 25, 2010 11:50 AM)  Physical Exam  General:  no acute distress Head:  normocephalic and atraumatic Eyes:  glasses otherwise normal Neck:  Neck supple, no JVD. No masses, thyromegaly or abnormal cervical nodes. Chest Hari Casaus:  no deformities  or breast masses noted Lungs:  Clear bilaterally to auscultation and percussion. Heart:  irregular rate and rhythm, soft systolic murmur, no gallop, no bruits Msk:  decreased ROM.  using walker Pulses:  pulses normal in all 4 extremities Extremities:  support hose, trace edema Neurologic:  Alert and oriented x 3. Skin:  Intact without lesions or rashes. Psych:  Normal affect.   PPM Specifications Following MD:  [name], MD     PPM Vendor:  [employer]     PPM Model Number:  [phone]     PPM Serial Number:  [id number] PPM DOI:  10/09/2003     PPM Implanting MD:  [name], MD  Lead 1    Location: RA     DOI: 10/09/2003     Model #: [id number]     Serial #: [id number]     Status: active Lead 2    Location: RV     DOI: 10/09/2003     Model #: [id number]     Serial #: [id number]     Status: active  Magnet Response Rate:  BOL 85 ERI 65  Indications:  INTERMITTENT CHB, SINUS BRADY   PPM Follow Up Pacer Dependent:  No      Episodes Coumadin:  Yes  Parameters Mode:  DDDR     Lower Rate Limit:  60     Upper Rate Limit:  130 Paced AV Delay:  300     Sensed AV Delay:  280 Rate Response Parameters:  ADL response-3, Exertion response-3, Threshold-Med/Low, Acceleration-30 seconds, Deceleration-Exercise  Impression & Recommendations:  Problem # 1:  ATRIAL FIBRILLATION, PAROXYSMAL (ICD-427.31) Assessment Unchanged  His updated medication list for this problem includes:    Metoprolol Tartrate 25 Mg Tabs (Metoprolol tartrate) ..... By mouth two times a day    Aspirin Ec 325 Mg Tbec (Aspirin) .Marland Kitchen... Take one tablet by mouth daily    Warfarin Sodium 1 Mg Tabs (Warfarin sodium) ..... Use as directed by anticoagualtion clinic  Problem # 2:  OLD MYOCARDIAL INFARCTION (ICD-412) Assessment: Unchanged  His updated medication list for this problem includes:    Metoprolol Tartrate 25 Mg Tabs (Metoprolol tartrate) ..... By mouth two times a day    Aspirin Ec 325 Mg Tbec (Aspirin) .Marland Kitchen... Take one tablet by mouth daily    Benazepril Hcl 5 Mg Tabs (Benazepril hcl) .Marland Kitchen... 1/2 by mouth once daily    Warfarin Sodium 1 Mg Tabs (Warfarin sodium) ..... Use as directed by anticoagualtion clinic    Nitrostat 0.4 Mg Subl (Nitroglycerin) ..... Use as directed  Problem # 3:  CORONARY ARTERY DISEASE (ICD-414.00) Assessment: Unchanged  His updated medication list for this problem includes:    Metoprolol Tartrate 25 Mg Tabs (Metoprolol  tartrate) ..... By mouth two times a day    Aspirin Ec 325 Mg Tbec (Aspirin) .Marland Kitchen... Take one tablet by mouth daily    Benazepril Hcl 5 Mg Tabs (Benazepril hcl) .Marland Kitchen... 1/2 by mouth once daily    Warfarin Sodium 1 Mg Tabs (Warfarin sodium) ..... Use as directed by anticoagualtion clinic    Nitrostat 0.4 Mg Subl (Nitroglycerin) ..... Use as directed  Problem # 4:  PACEMAKER, PERMANENT (ICD-V45.01) Assessment: Unchanged  Problem # 5:  COUMADIN THERAPY (ICD-V58.61) Assessment: Unchanged  Patient Instructions: 1)  Your physician recommends that you schedule a follow-up appointment in: 6 months with Dr. Daleen Squibb 2)  Your physician recommends that you continue on your current medications as directed. Please refer to the Current Medication list given to  you today.

## 2010-12-18 NOTE — Progress Notes (Signed)
Summary: Benazepril Refill  Phone Note Refill Request Message from:  Fax from Pharmacy on December 10, 2010 2:14 PM  Refills Requested: Medication #1:  BENAZEPRIL HCL 5 MG  TABS 1/2 by mouth once daily   Dosage confirmed as above?Dosage Confirmed   Brand Name Necessary? No   Supply Requested: 3 months   Last Refilled: 09/16/2010 [facility] pharmacy [address] [place], [place] [address] fax [phone]   Method Requested: Electronic Next Appointment Scheduled: 02.16.2012 Embry Huss Initial call taken by: Elba Barman,  December 10, 2010 2:23 PM  Follow-up for Phone Call        Rx completed in Dr. Tiajuana Amass Follow-up by: Glendell Docker CMA,  December 10, 2010 4:34 PM    Prescriptions: BENAZEPRIL HCL 5 MG  TABS (BENAZEPRIL HCL) 1/2 by mouth once daily  #45 x 3   Entered by:   Glendell Docker CMA   Authorized by:   D. Thomos Lemons DO   Signed by:   Glendell Docker CMA on 12/10/2010   Method used:   Electronically to        [facility] (retail)       [address]       [place], [place]  [address]       Ph: [phone]       Fax: [phone]   RxID:   [id number]

## 2010-12-25 ENCOUNTER — Encounter (INDEPENDENT_AMBULATORY_CARE_PROVIDER_SITE_OTHER): Payer: Medicare Other

## 2010-12-25 ENCOUNTER — Encounter: Payer: Self-pay | Admitting: Internal Medicine

## 2010-12-25 DIAGNOSIS — I509 Heart failure, unspecified: Secondary | ICD-10-CM

## 2010-12-25 DIAGNOSIS — Z7901 Long term (current) use of anticoagulants: Secondary | ICD-10-CM

## 2010-12-26 ENCOUNTER — Encounter: Payer: Self-pay | Admitting: Internal Medicine

## 2010-12-26 ENCOUNTER — Ambulatory Visit (INDEPENDENT_AMBULATORY_CARE_PROVIDER_SITE_OTHER): Payer: Medicare Other | Admitting: Internal Medicine

## 2010-12-26 DIAGNOSIS — L538 Other specified erythematous conditions: Secondary | ICD-10-CM | POA: Insufficient documentation

## 2010-12-27 ENCOUNTER — Encounter (INDEPENDENT_AMBULATORY_CARE_PROVIDER_SITE_OTHER): Payer: Self-pay | Admitting: *Deleted

## 2010-12-30 ENCOUNTER — Encounter (INDEPENDENT_AMBULATORY_CARE_PROVIDER_SITE_OTHER): Payer: Medicare Other

## 2010-12-30 ENCOUNTER — Encounter: Payer: Self-pay | Admitting: Internal Medicine

## 2010-12-30 DIAGNOSIS — I495 Sick sinus syndrome: Secondary | ICD-10-CM

## 2011-01-01 NOTE — Medication Information (Signed)
Summary: Coumadin Clinic  Anticoagulant Therapy  Managed by: Weston Brass, PharmD Referring MD: Sharrell Ku PCP: [name] DO Supervising MD: Gala Romney MD, Reuel Boom Indication 1: Congestive Heart Failure (ICD-428.0) Lab Used: [facility] Site: [facility] INR POC 2.4 INR RANGE 2 - 3  Dietary changes: no    Health status changes: no    Bleeding/hemorrhagic complications: no    Recent/future hospitalizations: no    Any changes in medication regimen? no    Recent/future dental: no  Any missed doses?: no       Is patient compliant with meds? yes       Allergies: No Known Drug Allergies  Anticoagulation Management History:      The patient is taking warfarin and comes in today for a routine follow up visit.  Positive risk factors for bleeding include an age of 75 years or older.  The bleeding index is 'intermediate risk'.  Positive CHADS2 values include History of CHF, History of HTN, and Age > 66 years old.  The start date was 10/11/2003.  His last INR was 3.0.  Anticoagulation responsible provider: Bensimhon MD, Reuel Boom.  INR POC: 2.4.  Cuvette Lot#: [id number].  Exp: 11/2011.    Anticoagulation Management Assessment/Plan:      The patient's current anticoagulation dose is Warfarin sodium 1 mg tabs: Use as directed by Anticoagualtion Clinic.  The target INR is 2.0-3.0.  The next INR is due 01/22/2011.  Anticoagulation instructions were given to patient.  Results were reviewed/authorized by Weston Brass, PharmD.  He was notified by Margot Chimes PharmD Candidate.         Prior Anticoagulation Instructions: INR 2.3 (INR goal: 2-3)  Take 1 tablet everyday except 2 tablets on Sundays, Tuesdays, and Thursdays.  Recheck in 4 weeks.  Current Anticoagulation Instructions: INR 2.4   Continue to take 1 tablet everyday except when you take 2 tablets on Sundays, Tuesdays, and Thursdays.  Recheck INR In 4 weeks.

## 2011-01-01 NOTE — Letter (Signed)
Summary: Device-Delinquent Phone Journalist, newspaper, Main Office  [address]   [place], [place] [address]   Phone: [phone]  Fax: [phone]     December 27, 2010 MRN: [id number]   [name] [address] [place], [place]  [address]   Dear Mr. Aylward,  According to our records, you were scheduled for a device phone transmission on  12-19-10.     We did not receive any results from this check.  If you transmitted on your scheduled day, please call us to help troubleshoot your system.  If you forgot to send your transmission, please send one upon receipt of this letter.  Thank you,   Architectural technologist [facility]

## 2011-01-07 ENCOUNTER — Encounter: Payer: Self-pay | Admitting: Internal Medicine

## 2011-01-07 DIAGNOSIS — I4891 Unspecified atrial fibrillation: Secondary | ICD-10-CM

## 2011-01-07 DIAGNOSIS — I059 Rheumatic mitral valve disease, unspecified: Secondary | ICD-10-CM | POA: Insufficient documentation

## 2011-01-07 DIAGNOSIS — Z952 Presence of prosthetic heart valve: Secondary | ICD-10-CM

## 2011-01-16 NOTE — Assessment & Plan Note (Signed)
Summary: 6 month follow up/mhf   Vital Signs:  Patient profile:   77 year old male Height:      66 inches Weight:      203.25 pounds BMI:     32.92 O2 Sat:      96 % on Room air Temp:     98.6 degrees F oral Pulse rate:   76 / minute Resp:     20 per minute BP sitting:   90 / 50  (right arm) Cuff size:   large  Vitals Entered By: Glendell Docker CMA (December 26, 2010 2:11 PM)  O2 Flow:  Room air CC: 6 Month Follow up , Rash Is Patient Diabetic? No Pain Assessment Patient in pain? no        Primary Care Provider:  [name] DO  CC:  6 Month Follow up  and Rash.  History of Present Illness:  Rash      This is a 77 year old man who presents with Rash.  The patient reports macules.  The rash is located on the abdomen and groin.  The rash is worse with sweating.  advised by pharmacist that he has a "diaper rash"-he is treating it with Lotrim and Desitin, which has helped  he occ leaks urine which makes it harder to keep groin area dry  Preventive Screening-Counseling & Management  Alcohol-Tobacco     Smoking Status: never  Allergies (verified): No Known Drug Allergies  Past History:  Past Medical History: ATRIAL FIBRILLATION, PAROXYSMAL (ICD-427.31) CORONARY ARTERY DISEASE (ICD-414.00) CONGESTIVE HEART FAILURE (ICD-428.0) PACEMAKER (ICD-V45.Marland Kitchen01)  COUMADIN THERAPY (ICD-V58.61)  HYPERTENSION (ICD-401.9)    HYPERLIPIDEMIA (ICD-272.4)   ABNORMAL LFT'S (ICD-794.8) UNSTEADY GAIT (ICD-781.2) MEMORY LOSS (ICD-780.93) RENAL INSUFFICIENCY, CHRONIC (ICD-585.9) ABNORMAL FINDINGS GI TRACT (ICD-793.4)  INGUINAL HERNIA, RIGHT (ICD-550.90) BARRETTS ESOPHAGUS (ICD-530.85) DIVERTICULOSIS OF COLON (ICD-562.10) G E R D (ICD-530.81) TENDINITIS, SHOULDER, RIGHT (ICD-726.10) ABDOMINAL PAIN, UNSPECIFIED SITE (ICD-789.00) EAR PAIN, LEFT (ICD-388.70) CONTUSION, RIGHT FOOT (ICD-924.20) TRANSAMINASES, SERUM, ELEVATED (ICD-790.4) ADENOCARCINOMA, PROSTATE, HX OF  (ICD-V10.46)  Past Surgical History: Coronary Bypass Surgery Pacemaker     Hx of hernia repair age 89  (pt not sure if it was right or left side)        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   Lungs:  normal respiratory effort, slightly coarse breath sounds, no crackles, and no wheezes.   Heart:  normal rate, regular rhythm, and no gallop.   Skin:  faint redness of right groin area   Impression & Recommendations:  Problem # 1:  INTERTRIGO, CANDIDAL (ICD-695.89) Assessment Improved keep area dry.   use otc antifungal powder continue local care for now  Complete Medication List: 1)  Cvs Omeprazole 20 Mg Tbec (Omeprazole) .Marland Kitchen.. 1 by mouth once daily 2)  Lasix 80 Mg Tabs (Furosemide) .Marland Kitchen.. 1 by mouth once daily 3)  Metoprolol Tartrate 25 Mg Tabs (Metoprolol tartrate) .... By mouth two times a day 4)  Aspirin Ec 325 Mg Tbec (Aspirin) .... Take one tablet by mouth daily 5)  Potassium Chloride Cr 10 Meq Tbcr (Potassium chloride) .... Take 1 tablet by mouth once a day 6)  Benazepril Hcl 5 Mg Tabs (Benazepril hcl) .... 1/2 by mouth once daily 7)  Fosamax 70 Mg Tabs (Alendronate sodium) .Marland KitchenMarland KitchenMarland Kitchen  One by mouth weekly 8)  Pravastatin Sodium 10 Mg Tabs (Pravastatin sodium) .... One by mouth qpm 9)  Warfarin Sodium 1 Mg Tabs (Warfarin sodium) .... Use as directed by anticoagualtion clinic 10)  Nitrostat 0.4 Mg Subl (Nitroglycerin) .... Use as directed 11)  Zostavax [address] Unt/0.21ml Solr (Zoster vaccine live) .... Administer vaccine x 1  Patient Instructions: 1)  Use anti fungal powder once daily at bedtime 2)  Perform kegel exercises once daily 3)  Please schedule a follow-up appointment in 3 months. 4)  BMP prior to visit, ICD-9:  401.9 5)  Hepatic Panel prior to visit, ICD-9: 272.4 6)  Lipid Panel  prior to visit, ICD-9: 272.4 7)  TSH prior to visit, ICD-9: 272.4 8)  Please return for lab work one (1) week before your next appointment.    Orders Added: 1)  Est. Patient Level III [[address]]    Current Allergies (reviewed today): No known allergies

## 2011-01-22 ENCOUNTER — Encounter: Payer: Self-pay | Admitting: Cardiology

## 2011-01-22 ENCOUNTER — Encounter (INDEPENDENT_AMBULATORY_CARE_PROVIDER_SITE_OTHER): Payer: Medicare Other

## 2011-01-22 DIAGNOSIS — Z7901 Long term (current) use of anticoagulants: Secondary | ICD-10-CM

## 2011-01-22 DIAGNOSIS — I509 Heart failure, unspecified: Secondary | ICD-10-CM

## 2011-01-27 ENCOUNTER — Telehealth: Payer: Self-pay | Admitting: Internal Medicine

## 2011-01-27 ENCOUNTER — Encounter: Payer: Self-pay | Admitting: *Deleted

## 2011-01-28 NOTE — Medication Information (Signed)
Summary: rov/ewj  Anticoagulant Therapy  Managed by: Windell Hummingbird, RN Referring MD: Sharrell Ku PCP: [name] DO Supervising MD: Cassell Clement Indication 1: Congestive Heart Failure (ICD-428.0) Lab Used: [facility] Site: [address] INR POC 2.1 INR RANGE 2 - 3  Dietary changes: no    Health status changes: no    Bleeding/hemorrhagic complications: no    Recent/future hospitalizations: no    Any changes in medication regimen? no    Recent/future dental: no  Any missed doses?: no       Is patient compliant with meds? yes       Allergies: No Known Drug Allergies  Anticoagulation Management History:      The patient is taking warfarin and comes in today for a routine follow up visit.  Positive risk factors for bleeding include an age of 75 years or older.  The bleeding index is 'intermediate risk'.  Positive CHADS2 values include History of CHF, History of HTN, and Age > 40 years old.  The start date was 10/11/2003.  His last INR was 3.0.  Anticoagulation responsible provider: Schae Cando, Maisie Fus.  INR POC: 2.1.  Cuvette Lot#: [id number].  Exp: 01/2012.    Anticoagulation Management Assessment/Plan:      The patient's current anticoagulation dose is Warfarin sodium 1 mg tabs: Use as directed by Anticoagualtion Clinic.  The target INR is 2.0-3.0.  The next INR is due 02/19/2011.  Anticoagulation instructions were given to patient.  Results were reviewed/authorized by Windell Hummingbird, RN.  He was notified by Windell Hummingbird, RN.         Prior Anticoagulation Instructions: INR 2.4   Continue to take 1 tablet everyday except when you take 2 tablets on Sundays, Tuesdays, and Thursdays.  Recheck INR In 4 weeks.   Current Anticoagulation Instructions: INR 2.1 Continue taking 1 tablet every day, except take 2 tablets on Sundays, Tuesdays, and Thursdays. Recheck in 4 weeks.

## 2011-02-03 ENCOUNTER — Ambulatory Visit: Payer: Medicare Other | Admitting: Internal Medicine

## 2011-02-04 ENCOUNTER — Other Ambulatory Visit: Payer: Self-pay | Admitting: Pharmacist

## 2011-02-04 MED ORDER — WARFARIN SODIUM 1 MG PO TABS: ORAL_TABLET | ORAL | Status: DC

## 2011-02-06 NOTE — Letter (Signed)
Summary: Remote Device Check  [employer], Main Office  [address]   [place], [place] [address]   Phone: [phone]  Fax: [phone]     January 27, 2011 MRN: [id number]   SEVE MONETTE [address] [place], [place]  [address]   Dear Mr. Alcaide,   Your remote transmission was recieved and reviewed by your physician.  All diagnostics were within normal limits for you.  __X___Your next transmission is scheduled for:   04-03-11.  Please transmit at any time this day.  If you have a wireless device your transmission will be sent automatically.    Sincerely,  [name]

## 2011-02-06 NOTE — Progress Notes (Signed)
Summary: Knee Pain  Phone Note Call from Patient Call back at Home Phone [phone]   Caller: Patient Call For: D. Thomos Lemons DO Summary of Call: patient called and left voice message stating he is having sever right knee pain when he gets up and sits down. He would like to know if he need to come in to be seen, or what Dr [name] advises him to do. Initial call taken by: Glendell Docker CMA,  January 27, 2011 11:36 AM  Follow-up for Phone Call        yes, I suggest OV Follow-up by: D. Thomos Lemons DO,  January 27, 2011 5:25 PM  Additional Follow-up for Phone Call Additional follow up Details #1::        call placed  to patient at [phone], he was informed per Dr [name] instructions. He was advised to call back in the morning to schedule appointment with Dr [name]. Patient verbalized understanding and agrees as instructed Additional Follow-up by: Glendell Docker CMA,  January 27, 2011 5:42 PM

## 2011-02-06 NOTE — Cardiovascular Report (Signed)
Summary: Office Visit   Office Visit   Imported By: Roderic Ovens 01/28/2011 15:25:58  _____________________________________________________________________  External Attachment:    Type:   Image     Comment:   External Document

## 2011-02-19 ENCOUNTER — Ambulatory Visit (INDEPENDENT_AMBULATORY_CARE_PROVIDER_SITE_OTHER): Payer: Medicare Other | Admitting: *Deleted

## 2011-02-19 DIAGNOSIS — Z7901 Long term (current) use of anticoagulants: Secondary | ICD-10-CM

## 2011-02-19 DIAGNOSIS — I4891 Unspecified atrial fibrillation: Secondary | ICD-10-CM

## 2011-02-19 NOTE — Patient Instructions (Signed)
Continue taking 2 tablets on Sunday, Tuesday, and Thursday. And 1 tablet all other days. Recheck in 4 weeks.

## 2011-02-19 NOTE — Progress Notes (Signed)
No return date. 

## 2011-03-19 ENCOUNTER — Ambulatory Visit (INDEPENDENT_AMBULATORY_CARE_PROVIDER_SITE_OTHER): Payer: Medicare Other | Admitting: *Deleted

## 2011-03-19 DIAGNOSIS — I4891 Unspecified atrial fibrillation: Secondary | ICD-10-CM

## 2011-03-19 LAB — POCT INR: INR: 2.8

## 2011-03-25 NOTE — Assessment & Plan Note (Signed)
[facility]                            CARDIOLOGY OFFICE NOTE   GRECO, GASTELUM                      MRN:          [id number]  DATE:09/13/2008                            DOB:          August 07, 1917    [name] returns today for followup.  Since I last saw him, he fell and  tripped while his car was being worked on in a service center.  He did  hit his head and had to be evaluated at [facility].  He  has still little bit sore in his chest, which is causing little the  chest pain with inspiration.  He is having no angina.   He denies any orthopnea, PND, or increased peripheral edema.   His problem list is given on the last note of October 28, 2007.  We are  treating him medically at this point for his chronic systolic heart  failure and coronary artery disease.  He has a pacer for persistent  bradycardia.  His EKG shows proper pacemaker function today with some  ectopy.   He has a tremendous outlook on life.  He has up been all the time.   His medications since last visit have not changed.  Please refer to his  maintenance medication list.  There is only one change.  He is no longer  on Voltaren gel and he is on Crestor 5 mg every other day.   PHYSICAL EXAMINATION:  VITAL SIGNS:  Today his blood pressure is 106/60,  his pulse is 71 and regular.  Weight is 186.  HEENT:  Unchanged.  He has no major trauma where he hit his head.  NECK:  No JVD.  Carotids are full without bruits.  Thyroid is not  enlarged.  LUNGS:  Clear to auscultation and percussion.  He has some soreness to  palpation just below the left breast.  HEART:  Normal S1 and S2 with some irregularity.  There is no gallop.  There is a soft systolic murmur at the apex.  ABDOMEN:  Soft, good bowel sounds.  EXTREMITIES:  1+ edema.  Pulses were palpable and reduced.  NEURO:  Intact.   Mr. Formica is doing well with medical therapy.  He has to be very careful  with not  falling.  I will see him back again in 6 months.  He will  continue following with Dr. Thomos Lemons of Primary Care in [facility].     Thomas C. Daleen Squibb, MD, [facility]  Electronically Signed    TCW/MedQ  DD: 09/13/2008  DT: 09/13/2008  Job #: [id number]

## 2011-03-25 NOTE — Assessment & Plan Note (Signed)
[facility]                         GASTROENTEROLOGY OFFICE NOTE   HARCE, VOLDEN                      MRN:          [id number]  DATE:10/05/2007                            DOB:          1917-08-07    Stewart returns for followup of his endoscopy and colonoscopy in  September.  He has known Barrett's mucosa and erosive esophagitis  confirmed by biopsies.  He is doing well on omeprazole 20 mg a day.  He  also is chronically on Coumadin therapy for his cardiovascular  difficulties.  He currently denies problems with loose bowel movements.  He has known diverticulitis coli and had a negative colonoscopy exam  additionally in September 2002.  I actually have not seen Mr. Grell  since September of 2007.  He has been seeing Dr. [name] for some abnormal  liver function tests, and is referred for exam.   Review of his records shows normal liver function tests a year ago.  He  recently has had mild hyper-transaminasemia, and appropriately, Dr. [name]  has stopped his niacin and Zocor.  I do not see repeat liver function  tests since this has occurred.  The patient really denies right upper  quadrant pain, pruritus, fever, chills, clay-colored stools, dark urine,  icterus, fever, or chills.  As part of his workup, he had upper  abdominal ultrasound exam on August 13, 2007, which showed some fatty  infiltration of the liver and a 3.8x3.7 abdominal aortic aneurysm.  Common bile duct measured 6.5 mm.   Mr. Hippe has never had known hepatitis or pancreatitis.  Denies ethanol  abuse.  He was taking 1 glass of wine a day, which he has quit over the  last 2 months.  He is followed by cardiology for chronic systolic  congestive heart failure, coronary artery disease, and chronic atrial  fibrillation.  He is status post treatment for prostate carcinoma.   EXAM:  Shows him to be a healthy-appearing white male in no distress.  I  cannot appreciate stigmata of chronic  liver disease.  He weighs 178 pounds and blood pressure is 112/58, pulse 72 and regular.  I could not appreciate hepatosplenomegaly, abdominal masses, or  tenderness.  Bowel sounds were normal.   ASSESSMENT:  I think Mr. 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  He certainly has no stigmata of chronic liver  disease, negative ultrasound exam, no past history of liver disease, or  heavy ethanol abuse.   RECOMMENDATIONS:  Before proceeding further, we will check his liver  profile to see if his transaminases have normalized.  If they are still  elevated, we will proceed with further laboratory  testing appropriately.  He is to continue all of his other medications  as listed and reviewed in his chart.     Vania Rea. Jarold Motto, MD, [facility], FAGA  Electronically Signed    DRP/MedQ  DD: 10/05/2007  DT: 10/05/2007  Job #: [id number]   cc:   Barbette Hair. [name], DO  Jesse Sans. Wall, MD, [facility]

## 2011-03-25 NOTE — Assessment & Plan Note (Signed)
[facility]                            CARDIOLOGY OFFICE NOTE   JASIER, CALABRETTA                      MRN:          [id number]  DATE:04/10/2008                            DOB:          November 20, 1916    Mr. Lanthrop Lacivita returns today for followup.  He has been doing  remarkably well and has his usual great outlook.  He brings in a funny  list of questions today concerning lifestyle, all of which sound a lot  of fun!   He offers no complaints including no angina, dyspnea on exertion,  orthopnea, PND or peripheral edema.  He has had no syncope or  presyncope.  He denies getting lightheaded when he stands.  He has had  no nausea or vomiting. His appetite is pretty good.  His bowels are  moving well.   We have been perplexed by his blood pressure today which is a bit low  for him. The last time he was here, it was 102/60.  It is 88/56 checked  twice today.  His pulse is 60 and regular.  He is in no acute distress.   His problem list was outlined on October 28, 2007.   CURRENT MEDICATIONS:  1. Omeprazole 20 mg a day.  2. Lasix 80 mg a day.  3. Metoprolol 25 b.i.d.  4. Aspirin 325 mg a day.  5. Potassium 10 mEq a day.  6. Coumadin as directed.  7. Fosamax 70 mg a week.  8. Benazepril 5 mg a day.   He reports no bleeding and no melena.   PHYSICAL EXAMINATION:  VITAL SIGNS:  Blood pressure today is 88/56. His  pulse is 60 and regular.  Weight is 179 which is rock stable.  HEENT:  His skin color is excellent.  Conjunctivae are pink.  The rest  of his HEENT is unchanged.  NECK:  Carotid upstrokes are equal bilaterally without bruits.  There is  no JVD.  Thyroid is not enlarged.  Trachea is midline.  LUNGS:  Clear.  HEART:  Reveals a regular rate and rhythm.  No gallop.  ABDOMEN:  Soft, good bowel sounds.  No distention.  There is no  tenderness.  EXTREMITIES:  No cyanosis, clubbing, only trace edema.  Pulses are  present.  NEUROLOGIC:  Exam  is intact.   It is not clear why Mr. Pavey blood pressure is lower than normal.  He  is asymptomatic with this, and there is no obvious source by history or  physical.  I have asked him to check his blood pressure, and it is  running consistently below 100 to let us know.  I will plan on seeing  him back again otherwise in 6 months.   With him being on Coumadin, we will have him obtain a CBC.  His last  blood work was in October 2008 that I have in the chart.     Thomas C. Daleen Squibb, MD, [facility]  Electronically Signed    TCW/MedQ  DD: 04/10/2008  DT: 04/10/2008  Job #: [id number]   cc:   Barbette Hair. [name], DO

## 2011-03-25 NOTE — Assessment & Plan Note (Signed)
[name]                            CARDIOLOGY OFFICE NOTE   ELIODORO, [name]                      MRN:          [id number]  DATE:10/28/2007                            DOB:          1917/10/14    [name] returns today.   PROBLEM LIST:  1. Coronary artery disease.  He has chronic systolic heart failure      which is stable.  He is being treated medically at this point.  2. Permanent pacemaker for persistent bradycardia, post inferolateral      wall infarct, 2004.  3. Hypertension.  4. Chronic renal insufficiency.  5. Hyperlipidemia.  He had elevated transaminases, and Dr. [name]      discontinued his hyperlipidemic therapy.  He is going to see him      back in February to reevaluate this.  His last liver function test      had improved significantly.  I would hope he could get back on low-      dose statin at least.   MEDICATIONS:  His medications since the last visit are unchanged.  Please refer to the chart.   PHYSICAL EXAMINATION:  Blood pressure is 102/60, his pulse is 75 and  regular, his weight is 179.  HEENT:  He is wearing a Reunion hat today.  He says he has a lot of fun  and loves life.  PERRLA.  Extraocular movements intact.  Sclerae clear.  Facial symmetry is normal.  NECK:  Supple.  There is no JVD.  Carotids are full.  Thyroid is not  enlarged.  LUNGS:  Clear.  HEART:  Reveals a nondisplaced PMI.  He has a soft, systolic murmur at  the apex, no gallop.  ABDOMINAL EXAM:  Soft.  EXTREMITIES:  Reveal 1+ edema.  He is wearing ankle braces for his  arthritis and instability so pulses cannot be checked.  NEURO EXAM:  Intact.   ASSESSMENT AND PLAN:  [name] is doing well.  He has a remarkable  attitude, and just continues to do well.  Hopefully get back on a statin  when he has followup with Dr. [name] in February.  I will see him back in 6  months.     Thomas C. Daleen Squibb, MD, [facility]  Electronically Signed    TCW/MedQ  DD:  10/28/2007  DT: 10/28/2007  Job #: [id number]

## 2011-03-25 NOTE — Assessment & Plan Note (Signed)
[facility]                            CARDIOLOGY OFFICE NOTE   XYLON, CROOM                      MRN:          [id number]  DATE:07/29/2007                            DOB:          October 03, 1917    Mr. Eisenberger returns today for further management of the following issues:  1. Coronary artery disease.  He has a history of systolic congestive      heart failure which is chronic.  This has been stable.  He is being      treated medically at this point.  2. Status post permanent pacemaker implantation for persistent      bradycardia after an inferior lateral wall infarct.  He was      recently checked by Dr. Ladona Ridgel in February.  3. Hypertension.  4. Chronic renal insufficiency which has been stable.  5. Hyperlipidemia.   He had blood work with Dr. [name] recently which showed his liver function  tests to be elevated at 113 and 109.  His BNP was 337.  His lipids were  at goal.  Creatinine was 1.4, potassium 4.3, hemoglobin 11.5.  Dr. [name]  decreased his benazepril to 2.5 mg a day, and held his Zocor and niacin.  He is repeating blood work in 2 weeks for followup on his liver function  tests.   Mr. Behney played golf the other day, believe it or not.  He uses a  walker most of the time.  He says he is playing again tomorrow because  the weather is going to be beautiful!  He has an incredible positive  outlook.   His medications, otherwise, are unchanged.   His blood pressure today is 122/64, his pulse is 62 and regular.  His  weight is 179 which is up 2 pounds.  HEENT:  Unchanged.  NECK:  Shows no JVD.  Carotids are full.  LUNGS:  Clear to auscultation.  HEART:  Reveals a displaced PMI.  There is a very soft, faint murmur at  the apex.  There is no S3 gallop.  ABDOMINAL EXAM:  Soft, good bowel sounds.  EXTREMITIES:  Reveal 1+ edema, pulses are intact.  NEURO EXAM:  Grossly intact.  He is sharp as a tack himself!   His EKG today shows atrial pacing  with ventricular sensing.  He has  diffuse ST-segment T-wave changes in II, III, and AVF, as well as V4  through V6.  This is unchanged.   ASSESSMENT AND PLAN:  Mr. Muscat is doing well with medical therapy for  his cardiac disease.  I agree with Dr. Olegario Messier plan.  I hope he can get  back on, at least, Zocor, low dose, in  the future.  I might get rid of the niacin all together at this point in  time.  I will see him back again in 3 months.     Thomas C. Daleen Squibb, MD, [facility]  Electronically Signed    TCW/MedQ  DD: 07/29/2007  DT: 07/29/2007  Job #: [id number]   cc:   Barbette Hair. [name], DO

## 2011-03-25 NOTE — Assessment & Plan Note (Signed)
[facility]                            CARDIOLOGY OFFICE NOTE   MARIA, GALLICCHIO                      MRN:          [id number]  DATE:01/17/2009                            DOB:          Jul 20, 1917    Mr. Goddard comes in today.  He is doing remarkably well and continues to  have this impeccable memory, remembering my anniversary date of March 01, 2005.  Just for him, I put it on my calendar!   He is having no angina or ischemic symptoms.  He has an asymptomatic  left inguinal hernia picked up by Dr. [name].  He has had no symptoms,  whatsoever, but is scheduled to see Dr. Corliss Skains for possible elective  surgery under local anesthesia.  He is really not sure he wants have it  done.   His problem list as well outlined in my previous notes, last being  October 28, 2007.  He has a history of paroxysmal atrial fibrillation,  but has actually been mostly paced.  He remains on Coumadin.  He is  having no symptoms of heart failure and no symptoms of syncope or  presyncope.   He is back on a statin with low-dose pravastatin.  He is on 10 mg a day.   His other meds include;  1. Lasix 40 mg q.a.m.  2. Benazepril 2.5 mg a day.  3. Fosamax 70 mg per week.  4. Coumadin as directed.  5. Potassium 10 mEq a day.  6. Aspirin 325 mg a day.  7. Metoprolol 25 mg p.o. b.i.d.  8. Omeprazole 20 mg per day.   His exam, his blood pressure is 100/60, his pulse is 66 and he is paced.  His weight is 185 which is stable.  HEENT:  Normal.  NECK:  JVD.  Carotids are full.  Thyroid is not enlarged.  CHEST:  Lungs clear to auscultation and percussion.  HEART:  Displaced PMI with soft systolic murmur.  No gallop.  Regular  rate and rhythm.  No right-sided lift.  ABDOMEN:  Soft.  Good bowel sounds.  No midline bruit.  No hepatomegaly.  EXTREMITIES:  No cyanosis, clubbing, or significant with chronic edema.  He has support hose on as well as ankle braces.   EKG shows atrial  pacing.   I had a long talk Mr. Geiger today.  At this point in time, I think the  risk of stroke and other complications some from surgery are higher than  watchful waiting with his inguinal hernia.  I have advised against the  operation with his support.  We have talked about symptoms of protrusion  or incarceration.  If he begins to have any pain whatsoever there, he  will let us know as well as Dr. [name].  I will see him back as scheduled  in 6 months.      Thomas C. Daleen Squibb, MD, [facility]  Electronically Signed    TCW/MedQ  DD: 01/17/2009  DT: 01/18/2009  Job #: [id number]

## 2011-03-28 NOTE — Assessment & Plan Note (Signed)
[facility]                         ELECTROPHYSIOLOGY OFFICE NOTE   ALASDAIR, KLEVE                      MRN:          [id number]  DATE:12/17/2006                            DOB:          [birth year]    ADDENDUM:     Altha Harm, LPN     PO/MedQ  DD: 12/17/2006  DT: 12/18/2006  Job #: [id number]   cc:   Dr. Gaetana Michaelis, [facility], [place], [place]

## 2011-03-28 NOTE — Cardiovascular Report (Signed)
NAME:  [name], [name] NO.:  [id number]   MEDICAL RECORD NO.:  [id number]                   PATIENT TYPE:  INP   LOCATION:                                       FACILITY:  [facility]   PHYSICIAN:  Rollene Rotunda, M.D.                DATE OF BIRTH:  Dec 12, 1916   DATE OF PROCEDURE:  10/02/2003  DATE OF DISCHARGE:                              CARDIAC CATHETERIZATION   PRIMARY:  Kirk Ruths, M.D.   CARDIOLOGIST:  Jesse Sans. Wall, M.D.   PROCEDURE:  Left heart catheter, coronary arteriography.   INDICATIONS:  Evaluate patient with out-of-hospital inferior myocardial  infarction.   PROCEDURAL NOTE:  Left heart catheterization was started via the right  femoral artery.  The artery was cannulated using anterior wall puncture.  A  #6-French arterial sheath was inserted via the modified Seldinger technique.  Preformed Judkins and a pigtail catheter were utilized.  Towards the end of  the procedure I was unable to advance a guidewire to introduce a left bypass  graft catheter.  I tried a Wholey catheter advanced via the right Judkins.  Therefore, I approach via the left femoral artery which was cannulated in a  similar fashion to the right.  The patient tolerated the procedure well and  left the laboratory in stable condition.   RESULTS:  HEMODYNAMICS:  LV 108/25.  AO 108/79.   CORONARIES:  Left main had distal 50% stenosis.   The LAD had ostial 25% stenosis.  There was proximal 99% stenosis before  first septal perforator, 99% after septal perforator.  There were small  first and second diagonals.  The distal LAD was seen to fill via the LIMA  graft.   The circumflex had severe disease in the AV groove.  There was a large OM1  and OM2 which were perfused via a vein graft.  They were occluded at their  ostium.   The right coronary artery was occluded proximally.  The distal vessel was  seen to fill scantly with left to right collaterals.   Saphenous vein graft to the right coronary artery was occluded proximally.  This had been a sequential to the RCA/PDA.   Saphenous vein graft sequential to OM1 and OM2 was patent.   A LIMA to the LAD was patent.   LV:  The LV was not injected secondary to his renal insufficiency.  A distal  aortogram was obtained.  This demonstrated diffuse plaque in the mid and  distal abdominal aorta.  There was a small infrarenal abdominal aneurysm.  There was diffuse moderate, but nonobstructive plaque in the right greater  than left iliacs.  The renals were patent bilaterally with 25% stenosis in  the right.   CONCLUSION:  Severe three vessel coronary artery disease.  Occluded  saphenous vein graft to the right coronary artery.  The patient will be  managed medically due to the late presentation.  He does still have some  high degree heart block and will be watched in telemetry.  If this does not  recover we may need to consider pacemaker placement.  He will be managed  with aggressive secondary risk reduction with follow-up per Maisie Fus C. Daleen Squibb,  M.D. and Kirk Ruths, M.D.                                               Rollene Rotunda, M.D.    JH/MEDQ  D:  10/02/2003  T:  10/02/2003  Job:  [id number]

## 2011-03-28 NOTE — Discharge Summary (Signed)
NAMELOU, LOEWE                         ACCOUNT NO.:  [id number]   MEDICAL RECORD NO.:  [id number]                   PATIENT TYPE:  INP   LOCATION:  6532                                 FACILITY:  [facility]   PHYSICIAN:  Doylene Canning. Ladona Ridgel, M.D.               DATE OF BIRTH:  [birth year]   DATE OF ADMISSION:  10/01/2003  DATE OF DISCHARGE:  10/10/2003                                 DISCHARGE SUMMARY   HISTORY OF PRESENT ILLNESS:  An 77 year old gentleman with a history of not  feeling well for three days.  On Thursday, he returned from [place] after  getting medications.  He experienced sudden onset of dyspnea, nausea,  vomiting, and weakness.  He had anorexia that evening at 9:30 p.m.  He felt  very ill and went to bed.  He rose the next morning and continued to have  anorexia, malaise, and fatigue.  He developed progressive insomnia and over  the weekend noticed mild shortness of breath.  One day prior to admission,  he does report a history of chest pain and left arm pain, but on the morning  of admission, felt so ill that he went to the emergency room at [facility]  where he was found to have ST-segment elevation on EKG.  He was given  heparin and transferred to [facility] for further evaluation.  Also, of note, the patient recently received an influenza shot and received  500 mg of Cipro p.o. x1 at [facility].   PAST MEDICAL HISTORY:  Positive for coronary artery disease status post CABG  10 years ago.  Arthritis.  Normal exercise stress one week ago.  Hyperlipidemia.  Cataract of the right eye.  The patient lives an active  lifestyle, occasionally walks with a cane.  He has had acupuncture for  bilateral foot pain, chronic lower extremity edema using support hose.  The  patient was admitted and underwent a heart catheterization which showed left  main had distal 50% LAD, 25% ostial.  There was proximal 99% stenosis septal  perforator, 99% anteroseptal  perforator.  There was a small first and second  diagonal.  The distal LAD was seen to fill via the LIMA graft.  The  circumflex had severe disease in the AV groove.  There was a large OM-1 and  OM-2 which were perfused via a vein graft.  They were occluded at the  ostium.  The right coronaries are occluded proximally.  Distal vessel was  seen to fill scantly with the left to right collateral.  Saphenous vein  graft to the right coronary was occluded proximally.  This had been  sequential to the RCA PDA.  Saphenous vein graft to the OM-1 and OM-2 was  patent.  LIMA to the LAD was patent.  LV was not injected secondary to renal  insufficiency.  A distal aortogram was obtained and demonstrated diffuse  plaque in  the mid distal abdominal aorta, small infra-renal abdominal  aneurysm, diffuse moderate, but nonobstructive plaque in the right greater  than left iliac.  The renals were patent bilateral with 25% stenosis on the  right.  The patient was noted to have severe three-vessel coronary disease  with occluded saphenous vein graft to the right coronary artery.  The  patient will be managed medically due to late presentation.  The patient was  noted to be in high degree heart block upon admission and continued to  remain in high degree heart block procedure.  He the underwent placement of  a Medtronic pacemaker on November 29.  He had no immediate postoperative  complications.  A 2-D echo was performed which showed an EF of 30-40% with  mild MR.  The patient's echo was done November 22.  The patient was then  discharged to home in stable condition.  Also, of note, during  hospitalization, the patient was noted to have short episodes of atrial  fibrillation/atrial flutter.  He was to start Coumadin therapy the night of  discharge.   DISCHARGE MEDICATIONS:  The patient was discharged to home on:  1. Coated aspirin 325 daily.  2. Niacin 500 SA nightly.  3. Zocor 20 nightly.  4. FOLTX 1  daily.  5. Prilosec 20 daily.  6. __________ for the Ginkgo trial as before.  7. Altace 1.25 mg b.i.d.  8. Coreg 6.25 mg every 12 hours.  9. Coumadin 3 mg nightly.  10.      He was to take Tylenol 1-2 tablets every 4-6 hours as needed.   DISCHARGE INSTRUCTIONS:  Activity and wound care were per pacemaker  discharge sheet, low-fat, low-salt, low-cholesterol diet.  He was to be seen  at the Coumadin Clinic at [facility], December 2, at noon.  He was to follow  with the pacemaker clinic at [facility] in [place], December 15, at 9:30 and  the see the physician's assistant following that appointment.  He was also  to see Dr. Ladona Ridgel in [place] on March 16 at 10 a.m.  The patient was not  allowed to drive for two weeks.  He was to call if he had any drainage or if  any lumps develop at his right groin site of his catheterization.      Chinita Pester, C.R.N.P. [facility]                 Doylene Canning. Ladona Ridgel, M.D.    DS/MEDQ  D:  10/10/2003  T:  10/10/2003  Job:  [id number]   cc:   Kirk Ruths, M.D.  [address]  [place]  [place] [address]  Fax: [phone]   Jesse Sans. Wall, M.D.

## 2011-03-28 NOTE — Op Note (Signed)
[name]                         ACCOUNT NO.:  [id number]   MEDICAL RECORD NO.:  [id number]                   PATIENT TYPE:  INP   LOCATION:  6532                                 FACILITY:  [facility]   PHYSICIAN:  Doylene Canning. Ladona Ridgel, M.D.               DATE OF BIRTH:  07-23-1917   DATE OF PROCEDURE:  10/09/2003  DATE OF DISCHARGE:                                 OPERATIVE REPORT   PROCEDURE:  Permanent pacemaker insertion.   INDICATIONS FOR PROCEDURE:  Intermittent complete heart block with a history  of sinus bradycardia.   SURGEON:  Doylene Canning. Ladona Ridgel, M.D.   I. INTRODUCTION:  The patient is an 77 year old man with a history of  coronary artery disease status post bypass surgery.  Previously he had  preserved left ventricular function.  He was admitted to the hospital with  complete heart block and an inferior MI.  His heart block persisted for over  a week.  The patient also had underlying bradycardia.  On the day prior to  the procedure, the patient continued to have intermittent complete heart  block and with his background of sinus bradycardia and the need for beta  blockade secondary to his coronary artery disease, sinus bradycardia and  intermittent heart block he is now referred for a permanent pacemaker  insertion.   II. PROCEDURE:  After informed consent was obtained the patient was taken to  the diagnostic EP lab in the fasting state.  After the usual preparation and  draping, intravenous Fentanyl and midazolam were given for sedation. A total  of 30 mL of lidocaine was infiltrated into the left infraclavicular region.  A 6 cm incision was carried out over this region and electrocautery utilized  to dissect down to the fascial plane.  Ten mL of contrast was injected into  the left upper extremity venous system demonstrated a patent left subclavian  vein.  It was subsequently punctured times two and the Medtronic model 5076,  58 cm active fixation pacing lead,  serial #[id number] was advanced into the  right ventricle.  The Medtronic model 5076, 52 cm active fixation pacing  lead, serial #[id number] was advanced into the right atrium. Mapping was  carried out in the right ventricle at the final site.  The R waves measured  10 millivolts and the ventricular threshold was 0.7 volts at 0.5  milliseconds with a pacing impendence of 842 ohms.  Ten volt pacing did not  stimulate the diaphragm.  With the ventricular lead in satisfactory  position, attention was then turned to the atrial lead.  Mapping was carried  out in the right atrium where the lateral wall P waves measured 2 millivolts  and the pacing threshold was 0.5 volts at 0.5 milliseconds.  The pacing  impedance was 499 ohms with the lead actively fixed. Again, 10 volt pacing  did not stimulate the diaphragm.  With both atrial and ventricular  leads in  satisfactory position they were secured to the subpectoralis fascia with a  figure-of-eight silk suture.  In addition, the sewing sleeves were secured  with silk suture.  Electrocautery was utilized to make a subcutaneous  pocket.  Kanamycin irrigation was used to irrigate the pocket.  The  Medtronic In Pulse [id number] dual chamber pacemaker, serial [id number] was  connected to the atrial and ventricular pacing leads and placed in the  subcutaneous pocket.  The generator was secured with a silk suture.  Additional Kanamycin was utilized to irrigate the incision and the incision  was then closed with a layer of 2-0 Vicryl followed by a layer of 3-0 Vicryl  followed by a layer of 4-0 Vicryl.  Benzoin was painted on the skin, Steri-  Strips were applied and a pressure dressing placed.  The patient was  returned to his room in satisfactory condition.   III. COMPLICATIONS:  There were no immediate procedure complications.   IV. RESULTS:  This demonstrates successful implantation of a Medtronic dual  chamber pacemaker in a patient with intermittent  complete heart block and  underlying sinus bradycardia.                                               Doylene Canning. Ladona Ridgel, M.D.    GWT/MEDQ  D:  10/09/2003  T:  10/09/2003  Job:  [id number]   cc:   Patrica Duel, M.D.  [address]  [place]  [place] [address]  Fax: [phone]   Kirk Ruths, M.D.  [address]  [place]  [place] [address]  Fax: [phone]   Jesse Sans. Wall, M.D.   Kathrine Cords, R.N. [facility]

## 2011-03-28 NOTE — Assessment & Plan Note (Signed)
[facility]                         ELECTROPHYSIOLOGY OFFICE NOTE   [name], [name]                      MRN:          [id number]  DATE:12/17/2006                            DOB:          May 03, 1917    [name] was seen today in the clinic on December 17, 2006 for followup  of his Medtronic model #[id number] Enpulse. Date of implant was October 09, 2003 for intermittent complete heart block. On interrogation of his  device today, his battery voltage is 2.78. P waves measured 2.0 to 2.8  millivolts with a atrial capture threshold of 1 volt at 0.4 msec and an  atrial lead impedance of 504 ohms. R waves measured 15.68 to 22.40  millivolts with a ventricular pacing threshold of 0.75 volts at 0.4 msec  and a ventricular lead impedance of 691 ohms. There were 2 high  ventricular rate episodes noted, both very short in duration, one 27  seconds and the other one was 0.04 seconds at rates of 134-147 beats per  minute. No changes are made in his parameters. He will start on [facility]  and send a transmission at 3, 6 and 9 months time with a return office  visit in 1 year.      Altha Harm, LPN  Electronically Signed      Doylene Canning. Ladona Ridgel, MD  Electronically Signed   PO/MedQ  DD: 12/17/2006  DT: 12/18/2006  Job #: [id number]   cc:   [facility] Dr. Gaetana Michaelis

## 2011-03-28 NOTE — Consult Note (Signed)
[name], [name]                         ACCOUNT NO.:  [id number]   MEDICAL RECORD NO.:  [id number]                   PATIENT TYPE:  INP   LOCATION:  3314                                 FACILITY:  [facility]   PHYSICIAN:  Doylene Canning. Ladona Ridgel, M.D.               DATE OF BIRTH:  [birth year]   DATE OF CONSULTATION:  10/04/2003  DATE OF DISCHARGE:                                   CONSULTATION   INDICATION FOR CONSULTATION:  Evaluation of a patient with a complete heart  block.   HISTORY OF PRESENT ILLNESS:  The patient is an 77 year old male with known  coronary disease and previously preserved LV function.  He has a history of  hyperlipidemia and is status post bypass surgery 10 years ago.  He presented  to the emergency room by way of [facility] on October 01, 2003 with nausea,  malaise, anorexia, and vomiting.  He did not have chest pain.  His initial  EKG demonstrated inferior ST elevation with deep Q waves consistent with a  completed inferior myocardial infarction.  The patient was transferred here  for evaluation.  He underwent catheterization which demonstrated an occluded  vein graft to the right coronary artery and PDA.  His vein grafts to the  obtuse marginal branches and his LIMA to the LAD were patent and his  ejection fraction was between 30 and 40%.  The patient on initial evaluation  had evidence of complete heart block with a narrow QRS escape.  The  ventricular rate was approximately between 45 and 50 beats per minute.  The  sinus rate was between 90 and 100 beats per minute.  His QRS was narrow.  He  has not had syncope.  He has not had any ventricular arrhythmias with his  heart block.  The patient has been treated with aspirin, his lipid-lowering  therapy, including Niacin and Zocor, and Lasix and Lovenox.  He has not been  on any AV nodal blocker agents.   PAST MEDICAL HISTORY:  His past medical history is as previously noted.  He  has a history of chronic lower  extremity edema and he uses support  stockings.  He has a history of a cataract in his right eye.  He has a  history of arthritis.   ALLERGIES:  HE HAS NO KNOWN DRUG ALLERGIES.   SOCIAL HISTORY:  The patient lives in [place] with his wife and daughter.  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  He quit smoking at age 69.  He rarely  uses alcohol.   FAMILY HISTORY:  Noncontributory.   REVIEW OF SYSTEMS:  Notable for no vision or hearing problems.  He denies  any skin changes or skin rashes.  He does have a history of insomnia.  He  does have chronic dyspnea on exertion but denies cough, claudication, or  hemoptysis.  He denies nausea, vomiting, diarrhea or constipation except as  previously noted.  He denies arthritic complaints.  He denies any polyuria  or polydipsia.   PHYSICAL EXAMINATION:  GENERAL:  He is a pleasant, well-appearing, elderly  man in no distress.  VITAL SIGNS:  Blood pressure today was 102/50, the pulse was 48 and regular,  respirations were 18-22.  HEENT:  Normocephalic, atraumatic.  Pupils equal and round.  Oropharynx was  moist.  Sclerae were anicteric.  NECK:  No jugular venous distention, there was no thyromegaly.  Trachea was  midline.  Carotids were 2+ and symmetric.  There were no bruits.  CARDIOVASCULAR:  Regular rate and rhythm, with bradycardia.  There were no  murmurs appreciated.  LUNGS:  Clear bilaterally to auscultation with no wheezes, rales or rhonchi.  ABDOMEN:  Soft, nontender, non-distended, there was no organomegaly.  SKIN:  No lesions.  EXTREMITIES:  1+ edema on the right leg, pulses were 2+ and symmetric.  His  right groin has a small area of ecchymoses.  NEUROLOGIC:  He is alert and oriented x3 with cranial nerves II-XII grossly  intact.  His strength was 5/5 and symmetric.   EKG demonstrates normal sinus rhythm (sinus tachycardia) with complete heart  block and a narrow QRS escape with inferior Q waves and very minimal  residual ST  elevation.   IMPRESSION:  1. Out of hospital myocardial infarction (inferior).  2. Complete heart block secondary to #1.  3. Dyslipidemia.  4. Obesity.  5. History of arthritis.   DISCUSSION:  As [name] heart block is in the setting of an acute  inferior myocardial infarction, we will hold off on recommending pacemaker  insertion at the present time.  He is approximately 4-5 days out from his  acute infarct.  I would recommend a period of watch for waiting.  If his  heart block resolves within several days, then no pacemaker would be  warranted.  If he has continued and persistent heart block, then would  recommend proceeding with permanent pacemaker.  The patient has a history of  sinus bradycardia and if his sinus rate slows such that we could not give  him a beta-blocker then I would also strongly consider permanent pacing in  this patient.  At present, however, his sinus rate is in the 90-100 range.                                               Doylene Canning. Ladona Ridgel, M.D.    GWT/MEDQ  D:  10/04/2003  T:  10/04/2003  Job:  [id number]   cc:   Patrica Duel, M.D.  [address]  [place]  [place] [address]  Fax: [phone]   Kirk Ruths, M.D.  [address]  [place]  [place] [address]  Fax: [phone]   Jesse Sans. Wall, M.D.   [facility]

## 2011-03-28 NOTE — H&P (Signed)
[name], [name] NO.:  [id number]   MEDICAL RECORD NO.:  [id number]          PATIENT TYPE:  INP   LOCATION:  5799                         FACILITY:  [facility]   PHYSICIAN:  Thomos Lemons, D.O. [facility]   DATE OF BIRTH:  08-24-1917   DATE OF ADMISSION:  10/16/2004  DATE OF DISCHARGE:                                HISTORY & PHYSICAL   CHIEF COMPLAINT:  Bleeding after urination.   HISTORY OF PRESENT ILLNESS:  The patient is an 77 year old white male with  past medical history of coronary artery disease, chronic atrial fibrillation  on Coumadin, chronic renal insufficiency, status post pacemaker, who comes  to see me today regarding bleeding after urination.  The patient states that  his symptoms started approximately 11:00 a.m. today.  The patient went to  the bathroom to urinate and at the end noticed a generous amount of bright  red blood coming from his meatus.  The patient states that this recurred  later on in the early afternoon x 2, and bleeding has slightly worsened.  The patient notes that his last INR was approximately one week ago and was  elevated at 3.2.  The patient denies any history of bleeding from his  bladder or the urinary tract in the past.  The patient denies any  significant bladder pain.  The patient denies at this time chest discomfort,  dizziness, or weakness.  The patient's urologist that he normally sees is  Dr. Jerre Simon, but unfortunately he does not see patients in the [facility].   PAST MEDICAL HISTORY:  1.  Coronary artery disease.  History of coronary artery bypass graft x 3 in      1974.  2.  Chronic systolic dysfunction, congestive heart failure.  3.  Hyperlipidemia.  4.  Paroxysmal atrial fibrillation, for which he is on Coumadin.  5.  History of pacemaker for bradycardia.  6.  History of elevated PSA.  7.  GERD.  8.  Please also note that he had an inferior wall infarct in November 2004,      with an ejection  fraction of approximately 30-40%.   ADDITIONAL SURGICAL HISTORY:  1.  The patient had appendectomy in 1947.  2.  Tonsils and adenoids in 1922.  3.  Hernia repair some time between 1962 and 1964.   SOCIAL HISTORY:  He currently lives with his wife.  Admits to very seldom  alcoholic beverages, but did drink a couple glasses of wine the night prior  to this bleeding episode.  The patient has a remote smoking history, quit  approximately 25 years ago.   CURRENT MEDICATIONS:  1.  Omeprazole 20 mg 2 tablets daily.  2.  Furosemide 80 mg daily.  3.  Lisinopril 12.5 mg b.i.d.  4.  Metoprolol 25 mg b.i.d.  5.  Aspirin 325 mg daily.  6.  Niacin 500 mg daily.  7.  Simvastatin 20 mg daily.  8.  Coumadin 1 mg Tuesday, Thursday, and Saturday, and 1.5 mg all other      days.  9.  Potassium chloride 10 mEq  daily.   The patient does not report any allergies to medications.   REVIEW OF SYSTEMS:  No fevers, chills.  No weight gain, weight loss.  No  HEENT symptoms.  The patient denies any chest heaviness or palpitations or  chest pain.  RESPIRATORY:  No shortness of breath or cough.  The patient has  chronic wheezing.  GI:  No nausea, vomiting, constipation, diarrhea.  No  abdominal pain.  GU:  Symptoms as described above.  All other systems  negative.   PHYSICAL EXAMINATION:  WEIGHT:  214 pounds.  TEMPERATURE:  98.2.  PULSE:  80.  BLOOD PRESSURE:  130/70 in the left arm in a seated position.  GENERAL:  The patient is a very pleasant 77 year old white male in no acute  distress.  HEENT:  Pupils were equal and reactive to light bilaterally.  Tympanic  membranes and oropharynx are within normal limits.  The patient wears  bilateral hearing aids.  NECK:  Supple.  No carotid bruits.  RESPIRATORY:  Chest was clear to auscultation bilaterally.  CARDIOVASCULAR:  Regular rate and rhythm.  Positive systolic ejection murmur  2/5 in the right sternal border.  ABDOMEN:  Protuberant, nontender.   Positive bowel sounds.  Mild right lower  quadrant tenderness.  Otherwise, no suprapubic tenderness was noted.  GENITOURINARY:  The patient had a large adherent clot to his meatus, and his  pants were soaked with blood.  SKIN:  Warm and dry.   ASSESSMENT AND PLAN:  1.  Gross Hematuria/Bleeding from genitourinary tract.  2.  Coronary artery disease.  History of coronary artery bypass graft x 3.      Ejection fraction 30-40%.  3.  Anticoagulation.  4.  Gastroesophageal reflux disease.  5.  Hyperlipidemia.   RECOMMENDATIONS:  We will admit the patient for evaluation of his  genitourinary bleeding.  It is unclear at this time whether bleeding is from  the bladder or further up the genitourinary tract.  However, statistically  he likely has a bladder lesion.  We will check a UA with microscopy to  examine the red blood cells to look for evidence of whether this bleeding  can be coming from the kidneys.  The patient will have stat PT/INR drawn to  ascertain whether he is coagulopathic.  The patient may  need FFP and/or  vitamin K.  The urologist on call was consulted for evaluation.  The patient  will be maintained on his antihypertensives and his cholesterol medications  during his hospitalization.      Robe   RY/MEDQ  D:  10/16/2004  T:  10/16/2004  Job:  [id number]

## 2011-03-28 NOTE — Consult Note (Signed)
[name], STENDER NO.:  [id number]   MEDICAL RECORD NO.:  [id number]          PATIENT TYPE:  INP   LOCATION:  5737                         FACILITY:  [facility]   PHYSICIAN:  Valetta Fuller, M.D.  DATE OF BIRTH:  07/30/1917   DATE OF CONSULTATION:  10/16/2004  DATE OF DISCHARGE:                                   CONSULTATION   REASON FOR CONSULTATION:  Hematuria.   HISTORY OF PRESENT ILLNESS:  [name] is an 77 year old male.  His primary  urologic history has been an elevated PSA as well as some bladder neck  obstruction and BPH.  The patient has been under the care of one of the  urologists in the [place], [place], area.  He has had an  elevated PSA in the 15 range for several years.  He has had a couple of  biopsies in the past which have failed to reveal malignancy.  From a voiding  standpoint, he has mild to moderate outlet obstructive symptoms but nothing  really problematic.  This morning after a bowel movement he began urinating  and noticed nice clear urine.  Right at the end of urination, he started  noticing bloody urine and then began having significant bleeding per penis.  Throughout the day he has noticed a similar pattern. Generally at the  initiation of urination, the urine stream is clear and at the end of  voiding, it becomes bloody with then persistent bleeding per penis.  He has  had no dysuria.  The patient has had no abdominal pain, fever or chills.  He  has no previous history of hematuria.  He is otherwise asymptomatic and has  no other complaints.  Of note, the patient is on anticoagulation.  He does  have history of chronic atrial fibrillation and is status post pacemaker  placement.  His INR was elevated when checked approximately a week ago and  was 3.2.  His hemoglobin is normal as is his renal function and he is having  no increased difficulty with voiding.   PAST MEDICAL HISTORY:  1.  Coronary artery disease.  He  is status post bypass surgery approximately      30 years ago.  2.  History of congestive heart failure.  3.  Hyperlipidemia.  4.  Chronic atrial fibrillation.  5.  History of elevated PSA.  6.  The patient has had a hiatal hernia and appendectomy performed in the      past.  7.  The patient has remote tobacco use history.   CURRENT MEDICATIONS:  1.  Omeprazole.  2.  Lasix.  3.  Lisinopril.  4.  Metoprolol.  5.  Aspirin.  6.  Niacin.  7.  Coumadin.  8.  Potassium replacement.   ALLERGIES:  No known drug allergies.   PHYSICAL EXAMINATION:  GENERAL APPEARANCE:  He is a well-developed, well-  nourished male.  VITAL SIGNS:  Within normal limits.  LUNGS:  His respiratory effort is normal.  ABDOMEN:  Protuberant but soft without hepatosplenomegaly or obvious masses.  GU:  The patient's penis shows blood at  the meatus with some mild active  oozing.  There is no evidence of meatal pathology.  Scrotum, testes, adnexal  structures are normal.  Rectal sphincter tone is normal.  His prostate is  mildly enlarged but nontender without any worrisome features.   PROCEDURE:  The patient underwent flexible bedside cystoscopy.  The anterior  urethra was unremarkable.  Right at the bulbar urethra near the junction of  the prostatic urethra, there was active oozing of blood.  The patient had  moderate trilobar hyperplasia with a small middle lobe.  Upon inserting the  scope in the bladder, there was no evidence of any bleeding and the bladder  itself was crystal clear.  There was no evidence of tumors and efflux of  urine from both orifices was entirely clear.  The only pathology was oozing  from friable vessels within the urethra and prostatic urethra.   ASSESSMENT:  Terminal hematuria.  The fact that this patient's gross  hematuria is occurring at the end of voiding essentially eliminates the  upper tracts and even the bladder as the source of the hematuria.  Bleeding  of this nature has to  be coming from the prostatic urethra, bladder neck or  urethral area.  Indeed on cystoscopy, he does have evidence of oozing from a  friable vessel.  This will occasionally occur with voiding when a small  vessel tears and then bleeds.  His bleeding is obviously augmented by the  fact that he is on anticoagulation.  I do not recommend any treatment at  this time and this should resolve over the next day or so.  If it does not,  we can place a Foley catheter in to try to tamponade the vessel and leave  that in for a period of time.  Will want to get his anticoagulation back to  acceptable levels but certainly he does not need anything urgently done.  From a voiding standpoint, he is doing well and I do not see any other major  issues.  The patient does have an elevated PSA but given his age of 36 as  well as medical comorbidities, I would not recommend pursuing that at this  time.       DSG/MEDQ  D:  10/16/2004  T:  10/17/2004  Job:  [id number]

## 2011-03-28 NOTE — Discharge Summary (Signed)
[name], MALAY NO.:  [id number]   MEDICAL RECORD NO.:  [id number]          PATIENT TYPE:  INP   LOCATION:  5737                         FACILITY:  [facility]   PHYSICIAN:  Lonzo Cloud. [name], M.D. [facility] OF BIRTH:  [birth year]   DATE OF ADMISSION:  10/16/2004  DATE OF DISCHARGE:  10/18/2004                                 DISCHARGE SUMMARY   FINAL DIAGNOSES:  1.  Hematuria in the context of a history of benign prostatic hypertrophy      and bladder neck obstruction and elevated PSA followed by Dr. Isabel Caprice.      He is also on Coumadin and had an INR of 3.2 on admission.  2.  History of coronary artery disease with previous coronary artery bypass      grafting.  3.  History of congestive heart failure.  4.  History of paroxysmal atrial fibrillation, on Coumadin therapy.  5.  History of pacemaker placement for bradycardia.  6.  Hyperlipidemia, on Zocor and niacin.  7.  History of gastroesophageal reflux disease, on omeprazole.  8.  History of remote appendectomy, tonsillectomy and inguinal hernia      repair.   CONSULTATIONS:  Dr. Isabel Caprice with placement of a Foley catheter.  Dr. Isabel Caprice  recommended continuing Foley catheter with a leg bag and discontinuing  Coumadin for the time being.  The patient will follow up with Dr. Isabel Caprice in  the office.   HISTORY OF PRESENT ILLNESS:  The patient is an 77 year old gentleman, a  patient of Dr. [name].  He has a history of coronary artery disease, atrial  fibrillation on Coumadin, renal insufficiency, pacemaker and congestive  heart failure.  He presented to the office on October 16, 2004 with  hematuria and blood at the meatus.  There was apparently no previous history  of hematuria.  He does have chronic urologic problems with a history of BPH,  bladder neck obstruction and elevated PSAs followed by a doctor in [place],  [place].  He has had biopsies in the past which have been negative.  He was admitted for further  evaluation and treatment.   PHYSICAL EXAMINATION ON ADMISSION:  GENERAL:  An 77 year old gentleman in no  acute distress.  VITAL SIGNS:  Blood pressure 130/70, pulse 80 and slightly irregular,  respirations 18/minute and unlabored, temperature 98.2.  HEENT:  Unremarkable.  CHEST:  Clear to percussion and auscultation.  CARDIAC:  Regular rate and rhythm, a grade 1/6 systolic ejection murmur at  the left sternal border, no rubs or gallops heard.  ABDOMEN:  Obese but soft and tender without evidence of organomegaly or  masses.  GENITOURINARY:  A clot adherent to the meatus and pants were soaked with  blood.  EXTREMITIES:  Some degenerative arthritis, no cyanosis, clubbing or edema.  NEUROLOGIC:  Intact.   LABORATORY DATA:  Chest x-ray showed mild cardiomegaly and a tortuous  calcified aorta, evidence of previous coronary artery bypass grafting and  pacemaker, no acute abnormalities.  Hemoglobin 13.6, hematocrit 39.5, white  count 8500 with 44% segs.  Protime 22.4, INR 2.6.  Sodium 136, potassium  3.8, chloride 103, CO2 28, BUN 22, creatinine 1.4, blood sugar 85.  Calcium  8.9, total protein 6.0, albumin 3.8.  AST 22, ALT 25, alkaline phosphatase  55, total bilirubin 0.7.  PSA 19.5.  Lipid profile:  Cholesterol 134,  triglycerides 152, HDL 31, LDL 73.  Urine culture grew 6000 colonies  insignificant growth.  Urinalysis positive for blood.   HOSPITAL COURSE:  The patient was admitted by Dr. [name] as noted.  He was  continued on his home medications and Coumadin was decreased.  He was seen  in consultation by Dr. Isabel Caprice of the urology service.  He recommended  holding the patient's Coumadin and placed a Foley catheter with a leg bag.  He wanted the patient to continue on this Foley drainage with the leg bag  until seen back in the office in one week's time.  The patient's blood  pressure tended to be a little low post Foley catheter placement.  His  medications were adjusted and his  lisinopril was held for a day.  Blood  pressure improved to the 110-120 range systolic at discharge.   DISCHARGE MEDICATIONS:  The patient was instructed to hold his Coumadin and  will follow up in the office.  He is on:  1.  Omeprazole 40 mg p.o. daily.  2.  Lasix 80 mg p.o. q.a.m.  3.  __________ 1 tablet p.o. daily.  4.  Lisinopril 2.5 mg p.o. daily.  5.  Metoprolol 25 mg p.o. b.i.d.  6.  Simvastatin 20 mg p.o. at bedtime.  7.  Niaspan 500 mg p.o. at bedtime.  8.  Enteric-coated aspirin prior to the Niaspan.  9.  The patient will take Tylenol as needed for pain.   The patient will contact Dr. [name] for followup next week, and Dr. Isabel Caprice for  followup in two weeks.   CONDITION ON DISCHARGE:  Improved.      SMN/MEDQ  D:  10/19/2004  T:  10/20/2004  Job:  [id number]   cc:   Thomos Lemons, D.O. [facility]

## 2011-03-28 NOTE — H&P (Signed)
[name], [name]                         ACCOUNT NO.:  [id number]   MEDICAL RECORD NO.:  [id number]                   PATIENT TYPE:  INP   LOCATION:  3314                                 FACILITY:  [facility]   PHYSICIAN:  Sigel Bing, M.D.               DATE OF BIRTH:  1917/08/27   DATE OF ADMISSION:  10/01/2003  DATE OF DISCHARGE:                                HISTORY & PHYSICAL   PRIMARY PHYSICIAN:  Kirk Ruths, M.D.   PRIMARY CARDIOLOGIST:  Jesse Sans. Wall, M.D.   CHIEF COMPLAINT:  Malaise, fatigue, and abdominal pain.   HISTORY AND PHYSICAL:  The patient is an 77 year old man with history of not  feeling well for three days.  On Thursday he returned from the [place] after  getting his medications and experienced sudden onset of diaphoresis, nausea,  vomiting, and weakness, and then had anorexia that evening. At 9:30 p.m. he  felt very ill and went to bed. He awoke the next morning and continued to  have anorexia, malaise, and fatigue. The patient developed progressive  insomnia over the weekend and noticed mild shortness of breath one day prior  to admission. He does not report a history of chest pain or left arm pain,  but on the morning of admission felt so ill that he went to [facility] ED  where he was found to have ST-segment elevation on his EKG.  He was given  heparin and transferred [facility] for further evaluation.  Also of note the patient recently received a flu shot and he received 500 mg  of Cipro p.o. times one at [facility] ED.   PAST MEDICAL HISTORY:  1. CAD/CABG ten years ago. The patient reports he had six grafts. I do not     currently have the results of the procedure.  2. Arthritis.  3. History of a normal exercise stress test one week ago.  4. Hyperlipidemia.  5. Cataract to the right eye. He is currently awaiting cataract removal.  6. The patient lives an active lifestyle and occasionally walks with a cane.  7. He has  acupuncture for bilateral foot pain.  8. Chronic lower extremity edema. He uses support stockings.   ALLERGIES:  No known drug allergies.   MEDICATIONS:  1. Aspirin 325 mg half an hour before his niacin.  2. Niacin 500 mg SA q.h.s.  3. Simvastatin 20 mg q.h.s.  4. Foltx 1 mg per day.  5. Prilosec 20 mg daily.  6. HCTZ/triamterene 37.5/25 mg daily.  7. GEM pill for ginkgo study daily.   Transfer medications also include a heparin drip.   SOCIAL HISTORY:  The patient lives in [place] with his wife and daughter.  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 Tobacco: He  quit tobacco in at age 30. Alcohol: Occasional alcohol use.   FAMILY HISTORY:  Noncontributory.   REVIEW OF  SYSTEMS:  CONSTITUTIONAL: He has insomnia. CARDIOPULMONARY:  Positive shortness of breath and dyspnea on exertion. NEUROPSYCHIATRIC:  Weakness and malaise. MUSCULOSKELETAL:  Bilateral lower extremity  arthralgias. GI: Nausea and vomiting for the past two days.  All other  review of systems are currently negative.   PHYSICAL EXAMINATION:  VITAL SIGNS: Blood pressure 94/50, respirations 21,  pulse 63, temperature 98.2.  He is saturating 95% on two liters. He is 5  feet 6-1/2 inches and 193 pounds.  GENERAL: He is a well-developed, well-nourished man in no apparent distress.  HEENT:  Within normal limits.  NECK: No bruits, no JVD.  No lymphadenopathy.  CARDIOVASCULAR: Regular rate and rhythm with no murmurs, rubs, or gallops.  LUNGS: Clear to auscultation bilaterally with no rales, rhonchi, or wheezes.  SKIN: Within normal limits.  ABDOMEN: Soft, nontender, nondistended, and obese. Normal bowel sounds.  RECTAL: Deferred.  EXTREMITIES: No clubbing, cyanosis. He has 1+ lower extremity edema and 3+  lower extremity pulses.  NEUROLOGIC: Intact.   Chest x-ray is a poor quality film. There are no acute processes.  EKG shows  a rate of 62 with second-degree AV block. He has inferior ST  elevation II,  III, and S as well as mild elevation in V5 and V6. His old EKGs did come  with him from [facility]. He has slight ST depression in V2. His QRS  duration was 92.   Laboratories reveal white count 15.6, platelet count 228,000, hemoglobin  13.2, hematocrit 40. Sodium 134, potassium 3.9, chloride 104, bicarbonate  24, BUN 43, creatinine 1.8, glucose 103. CK 687, MB 21, troponin-I 24.04.  PTT 30, PT 14, calcium 8.6. UA is negative except for 11-20 white blood  cells.   ASSESSMENT/PLAN:  The patient is an 77 year old man with a history of  coronary artery bypass graft ten years ago who presents with an inferior ST-  segment elevation myocardial infarction. He is currently stable and doing  well, but he continues to have ST-elevation inferiorly. He is not currently  experiencing chest pain, but he is hemodynamically stable. His symptoms most  likely began three days prior to admission.  1. Acute myocardial infarction/inferior ST elevation. We will continue     heparin even though IIb/IIIa inhibitors are of limited use in ST-segment     elevation myocardial infarction. I will start him on Integrilin this     evening in preparation for him going to the catheterization laboratory in     the morning. Will continue his aspirin and heparin. I will not give him a     beta blocker because of his sinus bradycardia and borderline     hypertension. I will also hold on giving him Plavix and an ACE inhibitor     for now. The patient does have an elevated creatinine and we will check     this prior to going to the cardiac catheterization laboratory in the     morning.  2. Elevated creatinine. Will give hydration and Mucomyst.  3. Hyperlipidemia. Will check his lipids in the morning.  4. Questionable urinary tract infection.  I will repeat a urinalysis at     [facility].  5. For his catheterization, I have gone over the procedure with him and he    is aware of all the risks  and benefits.  6. The patient may take his GEM pill for his ginkgo memory study.      Heloise Beecham, M.D. [facility]  Duson Bing, M.D.    DWM/MEDQ  D:  10/01/2003  T:  10/01/2003  Job:  [id number]

## 2011-04-03 ENCOUNTER — Encounter: Payer: Medicare Other | Admitting: *Deleted

## 2011-04-04 ENCOUNTER — Encounter: Payer: Self-pay | Admitting: *Deleted

## 2011-04-08 ENCOUNTER — Telehealth: Payer: Self-pay | Admitting: Internal Medicine

## 2011-04-08 NOTE — Telephone Encounter (Signed)
Refill   Metoprolol Tartrate  25mg    #60   Take One Tablet by Mouth Twice daily   4.30.12

## 2011-04-09 MED ORDER — METOPROLOL TARTRATE 25 MG PO TABS: 25.0000 mg | ORAL_TABLET | Freq: Two times a day (BID) | ORAL | Status: DC

## 2011-04-10 ENCOUNTER — Other Ambulatory Visit: Payer: Self-pay | Admitting: Internal Medicine

## 2011-04-10 ENCOUNTER — Ambulatory Visit (INDEPENDENT_AMBULATORY_CARE_PROVIDER_SITE_OTHER): Payer: Medicare Other | Admitting: *Deleted

## 2011-04-10 DIAGNOSIS — I442 Atrioventricular block, complete: Secondary | ICD-10-CM

## 2011-04-16 ENCOUNTER — Encounter: Payer: Medicare Other | Admitting: *Deleted

## 2011-04-16 NOTE — Progress Notes (Signed)
PACER REMOTE 

## 2011-05-02 ENCOUNTER — Encounter: Payer: Self-pay | Admitting: *Deleted

## 2011-05-05 ENCOUNTER — Telehealth: Payer: Self-pay | Admitting: Internal Medicine

## 2011-05-05 NOTE — Telephone Encounter (Signed)
Refill- potassium chloride ter. Take one tablet by mouth once a day. Qty 90. Last fill 3.26.12  Refill- metoprolol tartrate 25mg  tab. Take one tablet (25mg  total) by mouth two times daily. Qty 60. Last fill 5.30.12

## 2011-05-06 MED ORDER — POTASSIUM CHLORIDE 10 MEQ PO TBCR: 10.0000 meq | EXTENDED_RELEASE_TABLET | Freq: Every day | ORAL | Status: DC

## 2011-05-06 MED ORDER — METOPROLOL TARTRATE 25 MG PO TABS: 25.0000 mg | ORAL_TABLET | Freq: Two times a day (BID) | ORAL | Status: DC

## 2011-05-06 NOTE — Telephone Encounter (Signed)
Rx refill sent to pharmacy. 

## 2011-05-09 ENCOUNTER — Encounter: Payer: Self-pay | Admitting: Internal Medicine

## 2011-05-21 ENCOUNTER — Ambulatory Visit (INDEPENDENT_AMBULATORY_CARE_PROVIDER_SITE_OTHER): Payer: Medicare Other | Admitting: *Deleted

## 2011-05-21 DIAGNOSIS — I4891 Unspecified atrial fibrillation: Secondary | ICD-10-CM

## 2011-05-21 LAB — POCT INR: INR: 2.8

## 2011-05-23 ENCOUNTER — Telehealth: Payer: Self-pay | Admitting: *Deleted

## 2011-05-23 NOTE — Telephone Encounter (Signed)
I spoke with Mrs. [name].  Her husband dropped off a letter today for Dr. Daleen Squibb in regards to recommending a new pcp at [facility] office.  She is aware Dr. Daleen Squibb will not be back in the office until 06/02/11.  She and her husband are fine with this. They would like his consideration for a new pcp since Dr. [name] has moved to [facility]. I will also forward this to Dr. Daleen Squibb. Mylo Red RN

## 2011-06-04 NOTE — Telephone Encounter (Signed)
Our scheduling staff will handle it.

## 2011-06-10 ENCOUNTER — Encounter: Payer: Self-pay | Admitting: *Deleted

## 2011-06-11 ENCOUNTER — Ambulatory Visit (INDEPENDENT_AMBULATORY_CARE_PROVIDER_SITE_OTHER): Payer: Medicare Other | Admitting: Cardiology

## 2011-06-11 ENCOUNTER — Encounter: Payer: Self-pay | Admitting: Cardiology

## 2011-06-11 ENCOUNTER — Ambulatory Visit (INDEPENDENT_AMBULATORY_CARE_PROVIDER_SITE_OTHER): Payer: Medicare Other | Admitting: *Deleted

## 2011-06-11 VITALS — BP 100/60 | HR 73 | Resp 14 | Ht 66.0 in | Wt 196.0 lb

## 2011-06-11 DIAGNOSIS — I4891 Unspecified atrial fibrillation: Secondary | ICD-10-CM

## 2011-06-11 DIAGNOSIS — I2510 Atherosclerotic heart disease of native coronary artery without angina pectoris: Secondary | ICD-10-CM

## 2011-06-11 DIAGNOSIS — I509 Heart failure, unspecified: Secondary | ICD-10-CM

## 2011-06-11 DIAGNOSIS — I059 Rheumatic mitral valve disease, unspecified: Secondary | ICD-10-CM

## 2011-06-11 LAB — POCT INR: INR: 2.6

## 2011-06-11 NOTE — Assessment & Plan Note (Signed)
Stable. No change in treatment. 

## 2011-06-11 NOTE — Patient Instructions (Addendum)
Your physician recommends that you schedule a follow-up appointment in: 6 months with Dr. Wall  

## 2011-06-11 NOTE — Assessment & Plan Note (Signed)
Stable. Continue medical therapy 

## 2011-06-11 NOTE — Progress Notes (Signed)
HPI [name] returns for evaluation of his ischemic cardiomyopathy, chronic systolic heart failure, mitral valve disease, history of A. Fib.  He offers no complaints. He denies any change in his weight or edema. He still gets around pretty well with his braces and walker.  He denies any bleeding or melena. He will have a protime today.  EKG shows normal sinus rhythm with an old inferior [name] infarct ST segment changes inferolaterally.   Past Medical History  Diagnosis Date  . Paroxysmal atrial fibrillation   . CAD (coronary artery disease)   . CHF (congestive heart failure)   . Pacemaker   . Encounter for long-term (current) use of anticoagulants   . Hypertension   . Hyperlipidemia   . Abnormal liver function   . Unsteady gait   . Memory loss   . Chronic renal insufficiency   . Nonspecific (abnormal) findings on radiological and other examination of gastrointestinal tract     abnormal findings GI tract  . Right inguinal hernia   . Barrett's esophagus   . Diverticulosis of colon   . GERD (gastroesophageal reflux disease)   . Tendinitis of right shoulder   . Abdominal pain, unspecified site   . Pain in left ear   . Contusion of right foot   . Transaminase or LDH elevation   . Adenocarcinoma of prostate     history of    Past Surgical History  Procedure Date  . Coronary artery bypass graft   . Pacemaker placement   . Hernia repair     age 77 ( not sure if it was right or left side)    No family history on file.  History   Social History  . Marital Status: Married    Spouse Name: N/A    Number of Children: N/A  . Years of Education: N/A   Occupational History  . Not on file.   Social History Main Topics  . Smoking status: Former Games developer  . Smokeless tobacco: Not on file   Comment: quit tobacco 25 years ago  . Alcohol Use: Yes     samll glass of wine  . Drug Use: Not on file  . Sexually Active: Not on file   Other Topics Concern  . Not on file    Social History Narrative      Married  Supportive daugherFormer Smoker -  quit tobacco 25 years ago  Alcohol use-yes (small glass of wine)        Smoking Status:  never    No Known Allergies  Current Outpatient Prescriptions  Medication Sig Dispense Refill  . alendronate (FOSAMAX) 70 MG tablet Take 70 mg by mouth every 7 (seven) days. Take with a full glass of water on an empty stomach.       Marland Kitchen aspirin 325 MG tablet Take 325 mg by mouth daily.        . benazepril (LOTENSIN) 5 MG tablet 1/2 tablet by mouth once a day      . furosemide (LASIX) 80 MG tablet Take 80 mg by mouth daily.        . metoprolol tartrate (LOPRESSOR) 25 MG tablet Take 1 tablet (25 mg total) by mouth 2 (two) times daily.  60 tablet  3  . nitroGLYCERIN (NITROSTAT) 0.4 MG SL tablet Place 0.4 mg under the tongue every 5 (five) minutes as needed.        Marland Kitchen omeprazole (PRILOSEC) 20 MG capsule Take 20 mg by mouth daily.        Marland Kitchen  potassium chloride (KLOR-CON) 10 MEQ CR tablet Take 1 tablet (10 mEq total) by mouth daily.  30 tablet  3  . pravastatin (PRAVACHOL) 10 MG tablet Take 10 mg by mouth every evening.        . warfarin (COUMADIN) 1 MG tablet Take as directed by Anticoagulation clinic   60 tablet  3    ROS Negative other than HPI.   PE General Appearance: well developed, well nourished in no acute distress, frail.  HEENT: symmetrical face, PERRLA, good dentition  Neck: no JVD, thyromegaly, or adenopathy, trachea midline Chest: symmetric without deformity Cardiac: PMI non-displaced, RRR, normal S1, S2, no gallop, soft systolic murmur at the apex.Lung: clear to ausculation and percussion Vascular: all pulses full without bruits  Abdominal: nondistended, nontender, good bowel sounds, no HSM, no bruits Extremities: no cyanosis, clubbing or edema, no sign of DVT, no varicosities  Skin: normal color, no rashes Neuro: alert and oriented x 3, non-focal Pysch: normal affect Filed Vitals:   06/11/11 1403  BP: 100/60   Pulse: 73  Resp: 14  Height: 5\' 6"  (1.676 m)  Weight: 196 lb (88.905 kg)    EKG  Labs and Studies Reviewed.   Lab Results  Component Value Date   WBC 7.7 03/04/2010   HGB 13.0 03/04/2010   HCT 39.4 03/04/2010   MCV 89.5 03/04/2010   PLT 230 03/04/2010      Chemistry      Component Value Date/Time   NA 141 06/25/2010 1856   K 4.5 06/25/2010 1856   CL 103 06/25/2010 1856   CO2 26 06/25/2010 1856   BUN 27* 06/25/2010 1856   CREATININE 1.37 06/25/2010 1856      Component Value Date/Time   CALCIUM 9.6 06/25/2010 1856   ALKPHOS 60 06/25/2010 1856   AST 28 06/25/2010 1856   ALT 27 06/25/2010 1856   BILITOT 0.5 06/25/2010 1856       Lab Results  Component Value Date   CHOL 194 03/04/2010   CHOL 171 08/23/202010   CHOL 144 04/18/2008   Lab Results  Component Value Date   HDL 33* 03/04/2010   HDL 36.3* 08/23/202010   HDL 14.7* 04/18/2008   Lab Results  Component Value Date   LDLCALC 107* 03/04/2010   LDLCALC 107* 08/23/202010   LDLCALC 88 04/18/2008   Lab Results  Component Value Date   TRIG 272* 03/04/2010   TRIG 140 08/23/202010   TRIG 90 04/18/2008   Lab Results  Component Value Date   CHOLHDL 5.9 Ratio 03/04/2010   CHOLHDL 4.7 CALC 08/23/202010   CHOLHDL 3.8 CALC 04/18/2008   No results found for this basename: HGBA1C   Lab Results  Component Value Date   ALT 27 06/25/2010   AST 28 06/25/2010   ALKPHOS 60 06/25/2010   BILITOT 0.5 06/25/2010   Lab Results  Component Value Date   TSH 4.027 03/04/2010

## 2011-06-18 ENCOUNTER — Encounter: Payer: Medicare Other | Admitting: *Deleted

## 2011-07-09 ENCOUNTER — Ambulatory Visit (INDEPENDENT_AMBULATORY_CARE_PROVIDER_SITE_OTHER): Payer: Medicare Other | Admitting: *Deleted

## 2011-07-09 DIAGNOSIS — I4891 Unspecified atrial fibrillation: Secondary | ICD-10-CM

## 2011-07-09 LAB — POCT INR: INR: 2.5

## 2011-07-10 ENCOUNTER — Other Ambulatory Visit: Payer: Self-pay | Admitting: Internal Medicine

## 2011-07-10 ENCOUNTER — Encounter: Payer: Self-pay | Admitting: Internal Medicine

## 2011-07-10 ENCOUNTER — Ambulatory Visit (INDEPENDENT_AMBULATORY_CARE_PROVIDER_SITE_OTHER): Payer: Medicare Other | Admitting: *Deleted

## 2011-07-10 DIAGNOSIS — I442 Atrioventricular block, complete: Secondary | ICD-10-CM

## 2011-07-11 LAB — REMOTE PACEMAKER DEVICE
AL AMPLITUDE: 2.8 mv
BATTERY VOLTAGE: 2.73 V
RV LEAD IMPEDENCE PM: 664 Ohm
VENTRICULAR PACING PM: 39

## 2011-07-15 ENCOUNTER — Encounter: Payer: Self-pay | Admitting: *Deleted

## 2011-07-15 ENCOUNTER — Other Ambulatory Visit: Payer: Self-pay | Admitting: Cardiology

## 2011-07-25 NOTE — Progress Notes (Signed)
Pacer checked by remote 

## 2011-08-04 ENCOUNTER — Ambulatory Visit (INDEPENDENT_AMBULATORY_CARE_PROVIDER_SITE_OTHER): Payer: Medicare Other | Admitting: *Deleted

## 2011-08-04 DIAGNOSIS — I4891 Unspecified atrial fibrillation: Secondary | ICD-10-CM

## 2011-08-06 ENCOUNTER — Ambulatory Visit (INDEPENDENT_AMBULATORY_CARE_PROVIDER_SITE_OTHER): Payer: Medicare Other | Admitting: Internal Medicine

## 2011-08-06 ENCOUNTER — Encounter: Payer: Self-pay | Admitting: Internal Medicine

## 2011-08-06 DIAGNOSIS — I4891 Unspecified atrial fibrillation: Secondary | ICD-10-CM

## 2011-08-06 DIAGNOSIS — E785 Hyperlipidemia, unspecified: Secondary | ICD-10-CM

## 2011-08-06 DIAGNOSIS — M19049 Primary osteoarthritis, unspecified hand: Secondary | ICD-10-CM

## 2011-08-06 DIAGNOSIS — Z8546 Personal history of malignant neoplasm of prostate: Secondary | ICD-10-CM

## 2011-08-06 DIAGNOSIS — Z79899 Other long term (current) drug therapy: Secondary | ICD-10-CM

## 2011-08-06 DIAGNOSIS — I509 Heart failure, unspecified: Secondary | ICD-10-CM

## 2011-08-06 NOTE — Progress Notes (Signed)
Subjective:    Patient ID: [name], male    DOB: [birth year], 77 y.o.   MRN: [id number]  HPI  New pt to me but known to our practice - here to establish care at [facility] (prev followed with Dr. [name]) Reviewed chronic medical issues:  B ankle DJD  - limits ambulation - wears braces when up - ambulated with RW or scooter when at home - also B hand DJD changes  CAF - chronic anticoag, follows with [facility] - no palpitations, swelling or chest pain  Prostate ca - dx 12/06 - ongoing uro care with Lupron q 35mo - last PSA normal per report  ICM - chronic mild hypotension but asymptomatic - no shortness of breath or edema change (wears chronci BLE compression hose)  CAD -s/p CABG 1974 - no anginal symptoms - the patient reports compliance with medication(s) as prescribed. Denies adverse side effects.  Past Medical History  Diagnosis Date  . Paroxysmal atrial fibrillation   . CAD (coronary artery disease)     CABG 08/1973  . CHF (congestive heart failure)     ICM  . Pacemaker 09/2003  . Hypertension   . Hyperlipidemia   . Unsteady gait     due to B ankle DJD and instability  . Memory loss   . Chronic renal insufficiency   . Barrett's esophagus   . Diverticulosis of colon   . GERD (gastroesophageal reflux disease)   . Adenocarcinoma of prostate 10/2005    lupron injections q51mo  . Atrial fibrillation     chronic anticoag  . Arthritis     ?rheumatoid, never dx or on DMARDs  . Osteoporosis    History reviewed. No pertinent family history.  History  Substance Use Topics  . Smoking status: Former Smoker    Quit date: 08/05/1986  . Smokeless tobacco: Not on file  . Alcohol Use: Yes     samll glass of wine     Review of Systems Constitutional: Negative for fever.  Respiratory: Negative for cough and shortness of breath.   Cardiovascular: Negative for chest pain.  Gastrointestinal: Negative for abdominal pain.  Musculoskeletal: Negative for gait problem.  Skin: Negative  for rash.  Neurological: Negative for dizziness.  No other specific complaints in a complete review of systems (except as listed in HPI above).     Objective:   Physical Exam BP 98/52  Pulse 79  Temp(Src) 97.4 F (36.3 C) (Oral)  Ht 5\' 6"  (1.676 m)  Wt 192 lb (87.091 kg)  BMI 30.99 kg/m2  SpO2 95% Wt Readings from Last 3 Encounters:  08/06/11 192 lb (87.091 kg)  06/11/11 196 lb (88.905 kg)  12/26/10 203 lb 4 oz (92.194 kg)   Constitutional: spry older man;. appears well-developed and well-nourished. No distress. Uses RW for ambulation assistance Neck: Normal range of motion. Neck supple. No JVD present. No carotid bruits,  No thyromegaly present.  Cardiovascular: Normal rate, regular rhythm and normal heart sounds.  No murmur heard. trace BLE edema (wearing calf high compression hose) Pulmonary/Chest: Effort normal and breath sounds normal. No respiratory distress. no wheezes.  Abdominal: Soft. Bowel sounds are normal. Patient exhibits no distension. There is no tenderness.  Musculoskeletal: chronic deformaties B hands R>L and MCP and R wrist consistent with Rheumatoid Arthritis - no hot synovitis or redness, B abkles with degenerative changes - wears B ankle lateral brace supports in shoes Neurological: he is alert and oriented to person, place, and time. No cranial nerve deficit.  Coordination normal.  Skin: Skin is warm and dry.  No erythema or ulceration.  Psychiatric: he has a normal mood and affect. behavior is normal. Judgment and thought content normal.   Lab Results  Component Value Date   WBC 7.7 03/04/2010   HGB 13.0 03/04/2010   HCT 39.4 03/04/2010   PLT 230 03/04/2010   CHOL 194 03/04/2010   TRIG 272* 03/04/2010   HDL 33* 03/04/2010   LDLDIRECT 127.9 08/17/2007   ALT 27 06/25/2010   AST 28 06/25/2010   NA 141 06/25/2010   K 4.5 06/25/2010   CL 103 06/25/2010   CREATININE 1.37 06/25/2010   BUN 27* 06/25/2010   CO2 26 06/25/2010   TSH 4.027 03/04/2010   INR 2.5 08/04/2011        Assessment & Plan:  See problem list. Medications and labs reviewed today.  Time spent with pt today 40 minutes, greater than 50% time spent counseling patient on cardiac hx, prostate cancer, arthritis and medication review. Also review of prior records

## 2011-08-06 NOTE — Assessment & Plan Note (Signed)
Degenerative change B hands R>L esp MCP and wrist; also B ankles Would suspect undx/untx RA but symptoms overall quiet without exac in many years No change tx

## 2011-08-06 NOTE — Assessment & Plan Note (Signed)
On statin, stable dose for years - 2ndary tx for CAD Check annual labs now- adjust prn

## 2011-08-06 NOTE — Assessment & Plan Note (Signed)
Dx 10/2005 - Lupron q38mo and normalized PSA by report Continue to follow with uro as ongoing - no changes recommended

## 2011-08-06 NOTE — Assessment & Plan Note (Signed)
Chronic anticoag at [facility] - rate controlled and asymptomatic

## 2011-08-06 NOTE — Patient Instructions (Signed)
It was good to see you today. We have reviewed your prior records including labs and tests today Medications reviewed, no changes at this time. Test(s) ordered today. Your results will be called to you after review (48-72hours after test completion). If any changes need to be made, you will be notified at that time. Please schedule followup in 6 months, call sooner if problems.  

## 2011-08-06 NOTE — Assessment & Plan Note (Signed)
Chronic ischemic CM without exacerbation symptoms stable The current medical regimen is effective;  continue present plan and medications.

## 2011-08-11 LAB — URINALYSIS, ROUTINE W REFLEX MICROSCOPIC
Glucose, UA: NEGATIVE
Nitrite: NEGATIVE
Protein, ur: NEGATIVE
pH: 6.5

## 2011-08-11 LAB — PROTIME-INR: Prothrombin Time: 33.4 — ABNORMAL HIGH

## 2011-09-01 ENCOUNTER — Other Ambulatory Visit: Payer: Self-pay | Admitting: Internal Medicine

## 2011-09-02 ENCOUNTER — Ambulatory Visit (INDEPENDENT_AMBULATORY_CARE_PROVIDER_SITE_OTHER): Payer: Medicare Other | Admitting: *Deleted

## 2011-09-02 DIAGNOSIS — I4891 Unspecified atrial fibrillation: Secondary | ICD-10-CM

## 2011-09-02 LAB — POCT INR: INR: 2.3

## 2011-09-09 ENCOUNTER — Other Ambulatory Visit: Payer: Self-pay | Admitting: Internal Medicine

## 2011-09-11 ENCOUNTER — Encounter: Payer: Self-pay | Admitting: Internal Medicine

## 2011-09-11 ENCOUNTER — Ambulatory Visit (INDEPENDENT_AMBULATORY_CARE_PROVIDER_SITE_OTHER): Payer: Medicare Other | Admitting: Internal Medicine

## 2011-09-11 DIAGNOSIS — I4891 Unspecified atrial fibrillation: Secondary | ICD-10-CM

## 2011-09-11 DIAGNOSIS — Z950 Presence of cardiac pacemaker: Secondary | ICD-10-CM

## 2011-09-11 DIAGNOSIS — I509 Heart failure, unspecified: Secondary | ICD-10-CM

## 2011-09-11 LAB — PACEMAKER DEVICE OBSERVATION
AL IMPEDENCE PM: 502 Ohm
AL THRESHOLD: 1 V
ATRIAL PACING PM: 43
BATTERY VOLTAGE: 2.72 V
RV LEAD IMPEDENCE PM: 682 Ohm

## 2011-09-11 NOTE — Assessment & Plan Note (Signed)
His device is working normally. We'll plan to recheck in several months. 

## 2011-09-11 NOTE — Patient Instructions (Addendum)
Your physician wants you to follow-up in: 12 months with Dr Taylor You will receive a reminder letter in the mail two months in advance. If you don't receive a letter, please call our office to schedule the follow-up appointment.  Remote monitoring is used to monitor your Pacemaker of ICD from home. This monitoring reduces the number of office visits required to check your device to one time per year. It allows us to keep an eye on the functioning of your device to ensure it is working properly. You are scheduled for a device check from home on 12/11/2011. You may send your transmission at any time that day. If you have a wireless device, the transmission will be sent automatically. After your physician reviews your transmission, you will receive a postcard with your next transmission date.   

## 2011-09-11 NOTE — Progress Notes (Signed)
HPI [name] returns today for followup. He is a very pleasant 77 year old man with a history of symptomatic bradycardia status post permanent pacemaker insertion. The patient also has diastolic heart failure and dyslipidemia and hypertension. He has coronary disease. He denies chest pain or shortness of breath. He has had chronic peripheral edema he admits to sodium and not too concerned about his edema. He denies syncope. No Known Allergies   Current Outpatient Prescriptions  Medication Sig Dispense Refill  . alendronate (FOSAMAX) 70 MG tablet Take 70 mg by mouth every 7 (seven) days. Take with a full glass of water on an empty stomach.       Marland Kitchen aspirin 325 MG tablet Take 325 mg by mouth daily.        . benazepril (LOTENSIN) 5 MG tablet 1/2 tablet by mouth once a day      . furosemide (LASIX) 20 MG tablet Take 2 tablets (40 mg total) by mouth daily.      . metoprolol tartrate (LOPRESSOR) 25 MG tablet TAKE 1 TABLET (25 MG TOTAL) BY MOUTH 2 (TWO) TIMES DAILY.  60 tablet  5  . nitroGLYCERIN (NITROSTAT) 0.4 MG SL tablet Place 0.4 mg under the tongue every 5 (five) minutes as needed.        Marland Kitchen omeprazole (PRILOSEC) 20 MG capsule Take 20 mg by mouth daily.        . potassium chloride (K-DUR,KLOR-CON) 10 MEQ tablet TAKE 1 TABLET (10 MEQ TOTAL) BY MOUTH DAILY.  30 tablet  5  . pravastatin (PRAVACHOL) 10 MG tablet Take 10 mg by mouth every evening.        . warfarin (COUMADIN) 1 MG tablet TAKE AS DIRECTED BY ANTICOAGULATION CLINIC  60 tablet  2  . DISCONTD: potassium chloride (KLOR-CON) 10 MEQ CR tablet Take 1 tablet (10 mEq total) by mouth daily.  30 tablet  3     Past Medical History  Diagnosis Date  . Paroxysmal atrial fibrillation   . CAD (coronary artery disease)     CABG 08/1973  . CHF (congestive heart failure)     ICM  . Pacemaker 09/2003  . Hypertension   . Hyperlipidemia   . Unsteady gait     due to B ankle DJD and instability  . Memory loss   . Chronic renal insufficiency   .  Barrett's esophagus   . Diverticulosis of colon   . GERD (gastroesophageal reflux disease)   . Adenocarcinoma of prostate 10/2005    lupron injections q19mo  . Atrial fibrillation     chronic anticoag  . Arthritis     ?rheumatoid, never dx or on DMARDs  . Osteoporosis     ROS:   All systems reviewed and negative except as noted in the HPI.   Past Surgical History  Procedure Date  . Pacemaker placement 09/2003  . Hernia repair     age 54 ( not sure if it was right or left side)  . Coronary artery bypass graft 08/1973    3 heart heart arteries were 80 percent clogged. the lower valve of my heart is not functioning  . Doppler echocardiography 2004, 2008     No family history on file.   History   Social History  . Marital Status: Married    Spouse Name: N/A    Number of Children: N/A  . Years of Education: N/A   Occupational History  . Not on file.   Social History Main Topics  . Smoking status:  Former Smoker    Quit date: 08/05/1986  . Smokeless tobacco: Not on file  . Alcohol Use: Yes     samll glass of wine  . Drug Use: Not on file  . Sexually Active: Not on file   Other Topics Concern  . Not on file   Social History Narrative   Married  - lives with spouse and supportive daugherFormer Smoker -  quit tobacco 25 years ago  Alcohol use-yes (small glass of wine)           BP 118/58  Pulse 72  Ht 5' 6.25" (1.683 m)  Wt 194 lb (87.998 kg)  BMI 31.08 kg/m2  Physical Exam:  Well appearing elderly man, NAD HEENT: Unremarkable Neck:  No JVD, no thyromegally Lymphatics:  No adenopathy Back:  No CVA tenderness Lungs:  Clear with no wheezes, rales, or rhonchi. Well-healed pacemaker incision. HEART:  Regular rate rhythm, no murmurs, no rubs, no clicks Abd:  soft, positive bowel sounds, no organomegally, no rebound, no guarding Ext:  2 plus pulses, no edema, no cyanosis, no clubbing Skin:  No rashes no nodules Neuro:  CN II through XII intact, motor  grossly intact  DEVICE  Normal device function.  See PaceArt for details.   Assess/Plan:

## 2011-09-11 NOTE — Assessment & Plan Note (Signed)
He appears to be maintaining sinus rhythm. We will continue his current medical therapy.

## 2011-09-11 NOTE — Assessment & Plan Note (Signed)
His symptoms appear to be class II. We will continue to try to reduce his sodium intake and take his medical therapy.

## 2011-09-12 ENCOUNTER — Other Ambulatory Visit (INDEPENDENT_AMBULATORY_CARE_PROVIDER_SITE_OTHER): Payer: Medicare Other

## 2011-09-12 ENCOUNTER — Other Ambulatory Visit: Payer: Self-pay | Admitting: *Deleted

## 2011-09-12 ENCOUNTER — Other Ambulatory Visit: Payer: Self-pay | Admitting: Internal Medicine

## 2011-09-12 DIAGNOSIS — E785 Hyperlipidemia, unspecified: Secondary | ICD-10-CM

## 2011-09-12 DIAGNOSIS — Z79899 Other long term (current) drug therapy: Secondary | ICD-10-CM

## 2011-09-12 LAB — HEPATIC FUNCTION PANEL
Albumin: 4.1 g/dL (ref 3.5–5.2)
Alkaline Phosphatase: 48 U/L (ref 39–117)
Total Protein: 6.9 g/dL (ref 6.0–8.3)

## 2011-09-12 LAB — BASIC METABOLIC PANEL
CO2: 27 mEq/L (ref 19–32)
Chloride: 104 mEq/L (ref 96–112)
Creatinine, Ser: 1.4 mg/dL (ref 0.4–1.5)
Potassium: 3.9 mEq/L (ref 3.5–5.1)
Sodium: 140 mEq/L (ref 135–145)

## 2011-09-12 LAB — LIPID PANEL
Cholesterol: 218 mg/dL — ABNORMAL HIGH (ref 0–200)
HDL: 42.5 mg/dL (ref >39.00)
Total CHOL/HDL Ratio: 5
VLDL: 50.4 mg/dL — ABNORMAL HIGH (ref 0.0–40.0)

## 2011-09-12 MED ORDER — PRAVASTATIN SODIUM 10 MG PO TABS: 10.0000 mg | ORAL_TABLET | Freq: Every evening | ORAL | Status: DC

## 2011-10-01 ENCOUNTER — Telehealth: Payer: Self-pay | Admitting: *Deleted

## 2011-10-01 NOTE — Telephone Encounter (Signed)
Pt states he has been having some loose stool for about 5-6 days. Been using Imodium ovc. Denies any blood or discomfort. Pt thinks its coming from one of his meds. Requesting md advise....10/01/11@3 :34pm/LMB

## 2011-10-01 NOTE — Telephone Encounter (Signed)
Prilosec is the most likely culprit on his list - okay to stop omeprazole for 1-2 weeks and continue Imodium or Kaopectate over-the-counter as needed - if fever, abdominal pain or worsening symptoms, should call for office evaluation. Thanks

## 2011-10-01 NOTE — Telephone Encounter (Signed)
Notified pt spoke with him & daughter gave md response...10/01/11@4 :54pm/[facility]

## 2011-10-14 ENCOUNTER — Ambulatory Visit (INDEPENDENT_AMBULATORY_CARE_PROVIDER_SITE_OTHER): Payer: Medicare Other | Admitting: *Deleted

## 2011-10-14 DIAGNOSIS — I4891 Unspecified atrial fibrillation: Secondary | ICD-10-CM

## 2011-10-14 LAB — POCT INR: INR: 2.5

## 2011-11-25 ENCOUNTER — Ambulatory Visit (INDEPENDENT_AMBULATORY_CARE_PROVIDER_SITE_OTHER): Payer: Medicare Other | Admitting: *Deleted

## 2011-11-25 DIAGNOSIS — I4891 Unspecified atrial fibrillation: Secondary | ICD-10-CM | POA: Diagnosis not present

## 2011-11-25 LAB — POCT INR: INR: 2.1

## 2011-12-02 ENCOUNTER — Other Ambulatory Visit: Payer: Self-pay | Admitting: *Deleted

## 2011-12-02 MED ORDER — BENAZEPRIL HCL 5 MG PO TABS: 2.5000 mg | ORAL_TABLET | Freq: Every day | ORAL | Status: DC

## 2011-12-11 ENCOUNTER — Encounter: Payer: Self-pay | Admitting: Cardiology

## 2011-12-11 ENCOUNTER — Ambulatory Visit (INDEPENDENT_AMBULATORY_CARE_PROVIDER_SITE_OTHER): Payer: Medicare Other | Admitting: Cardiology

## 2011-12-11 ENCOUNTER — Encounter: Payer: Medicare Other | Admitting: *Deleted

## 2011-12-11 VITALS — BP 102/56 | HR 80 | Ht 66.5 in | Wt 195.0 lb

## 2011-12-11 DIAGNOSIS — I4891 Unspecified atrial fibrillation: Secondary | ICD-10-CM

## 2011-12-11 DIAGNOSIS — Z954 Presence of other heart-valve replacement: Secondary | ICD-10-CM

## 2011-12-11 DIAGNOSIS — Z950 Presence of cardiac pacemaker: Secondary | ICD-10-CM

## 2011-12-11 DIAGNOSIS — I10 Essential (primary) hypertension: Secondary | ICD-10-CM | POA: Diagnosis not present

## 2011-12-11 DIAGNOSIS — I5042 Chronic combined systolic (congestive) and diastolic (congestive) heart failure: Secondary | ICD-10-CM | POA: Diagnosis not present

## 2011-12-11 DIAGNOSIS — E785 Hyperlipidemia, unspecified: Secondary | ICD-10-CM | POA: Diagnosis not present

## 2011-12-11 DIAGNOSIS — I059 Rheumatic mitral valve disease, unspecified: Secondary | ICD-10-CM

## 2011-12-11 DIAGNOSIS — Z952 Presence of prosthetic heart valve: Secondary | ICD-10-CM

## 2011-12-11 DIAGNOSIS — I252 Old myocardial infarction: Secondary | ICD-10-CM

## 2011-12-11 DIAGNOSIS — I2510 Atherosclerotic heart disease of native coronary artery without angina pectoris: Secondary | ICD-10-CM | POA: Diagnosis not present

## 2011-12-11 NOTE — Assessment & Plan Note (Signed)
Stable. No change in medical therapy. See back in the office in 6 months

## 2011-12-11 NOTE — Patient Instructions (Signed)
Your physician wants you to follow-up in: 6  Months with Dr. Daleen Squibb You will receive a reminder letter in the mail two months in advance. If you don't receive a letter, please call our office to schedule the follow-up appointment.  Your physician recommends that you continue on your current medications as directed. Please refer to the Current Medication list given to you today.

## 2011-12-11 NOTE — Progress Notes (Signed)
HPI [name] comes in today for evaluation and management of his multiple cardiac issues most notably chronic combined congestive heart failure. Continues to do remarkably well without any change in his weight, no symptoms of congestive failure, and no angina. He denies any presyncope or falls. He uses a walker. He remains on anticoagulation.  He denies orthopnea, PND or edema. He wears support hose.  Past Medical History  Diagnosis Date  . Paroxysmal atrial fibrillation   . CAD (coronary artery disease)     CABG 08/1973  . CHF (congestive heart failure)     ICM  . Pacemaker 09/2003  . Hypertension   . Hyperlipidemia   . Unsteady gait     due to B ankle DJD and instability  . Memory loss   . Chronic renal insufficiency   . Barrett's esophagus   . Diverticulosis of colon   . GERD (gastroesophageal reflux disease)   . Adenocarcinoma of prostate 10/2005    lupron injections q15mo  . Atrial fibrillation     chronic anticoag  . Arthritis     ?rheumatoid, never dx or on DMARDs  . Osteoporosis     Current Outpatient Prescriptions  Medication Sig Dispense Refill  . alendronate (FOSAMAX) 70 MG tablet Take 70 mg by mouth every 7 (seven) days. Take with a full glass of water on an empty stomach.       Marland Kitchen aspirin 325 MG tablet Take 325 mg by mouth daily.        . benazepril (LOTENSIN) 5 MG tablet Take 0.5 tablets (2.5 mg total) by mouth daily. 1/2 tablet by mouth once a day  45 tablet  1  . furosemide (LASIX) 20 MG tablet Take 2 tablets (40 mg total) by mouth daily.      . metoprolol tartrate (LOPRESSOR) 25 MG tablet TAKE 1 TABLET (25 MG TOTAL) BY MOUTH 2 (TWO) TIMES DAILY.  60 tablet  5  . nitroGLYCERIN (NITROSTAT) 0.4 MG SL tablet Place 0.4 mg under the tongue every 5 (five) minutes as needed.        Marland Kitchen omeprazole (PRILOSEC) 20 MG capsule Take 20 mg by mouth daily.        . potassium chloride (K-DUR,KLOR-CON) 10 MEQ tablet TAKE 1 TABLET (10 MEQ TOTAL) BY MOUTH DAILY.  30 tablet  5  .  pravastatin (PRAVACHOL) 10 MG tablet Take 1 tablet (10 mg total) by mouth every evening.  30 tablet  5  . warfarin (COUMADIN) 1 MG tablet TAKE AS DIRECTED BY ANTICOAGULATION CLINIC  60 tablet  2  . DISCONTD: potassium chloride (KLOR-CON) 10 MEQ CR tablet Take 1 tablet (10 mEq total) by mouth daily.  30 tablet  3    No Known Allergies  No family history on file.  History   Social History  . Marital Status: Married    Spouse Name: N/A    Number of Children: N/A  . Years of Education: N/A   Occupational History  . Not on file.   Social History Main Topics  . Smoking status: Former Smoker    Quit date: 08/05/1986  . Smokeless tobacco: Not on file  . Alcohol Use: Yes     samll glass of wine  . Drug Use: Not on file  . Sexually Active: Not on file   Other Topics Concern  . Not on file   Social History Narrative   Married  - lives with spouse and supportive daugherFormer Smoker -  quit tobacco 25 years ago  Alcohol use-yes (small glass of wine)          ROS ALL NEGATIVE EXCEPT THOSE NOTED IN HPI  PE  General Appearance: well developed, well nourished in no acute distress, elderly HEENT: symmetrical face, PERRLA, good dentition  Neck: no JVD, thyromegaly, or adenopathy, trachea midline Chest: symmetric without deformity Cardiac: PMI non-displaced, RRR, normal S1, S2, no gallop soft systolic murmur at the apex. Lung: clear to ausculation and percussion Vascular: all pulses full without bruits  Abdominal: nondistended, nontender, good bowel sounds, no HSM, no bruits Extremities: no cyanosis, clubbing chronic 1+ pitting edema no sign of DVT, no varicosities  Skin: normal color, no rashes Neuro: alert and oriented x 3, non-focal Pysch: normal affect  EKG  BMET    Component Value Date/Time   NA 140 09/12/2011 0926   K 3.9 09/12/2011 0926   CL 104 09/12/2011 0926   CO2 27 09/12/2011 0926   GLUCOSE 115* 09/12/2011 0926   BUN 25* 09/12/2011 0926   CREATININE 1.4 09/12/2011  0926   CALCIUM 9.3 09/12/2011 0926   GFRNONAA 51 July 22, 202010 0818   GFRAA 61 July 22, 202010 0818    Lipid Panel     Component Value Date/Time   CHOL 218* 09/12/2011 0926   TRIG 252.0* 09/12/2011 0926   HDL 42.50 09/12/2011 0926   CHOLHDL 5 09/12/2011 0926   VLDL 50.4* 09/12/2011 0926   LDLCALC 107* 03/04/2010 2113    CBC    Component Value Date/Time   WBC 7.7 03/04/2010 2113   RBC 4.40 03/04/2010 2113   HGB 13.0 03/04/2010 2113   HCT 39.4 03/04/2010 2113   PLT 230 03/04/2010 2113   MCV 89.5 03/04/2010 2113   MCHC 33.0 03/04/2010 2113   RDW 14.6 03/04/2010 2113   LYMPHSABS 2.4 03/04/2010 2113   MONOABS 1.0 03/04/2010 2113   EOSABS 0.2 03/04/2010 2113   BASOSABS 0.1 03/04/2010 2113

## 2011-12-15 ENCOUNTER — Encounter: Payer: Self-pay | Admitting: *Deleted

## 2011-12-29 ENCOUNTER — Other Ambulatory Visit: Payer: Self-pay | Admitting: Cardiology

## 2012-01-06 ENCOUNTER — Ambulatory Visit (INDEPENDENT_AMBULATORY_CARE_PROVIDER_SITE_OTHER): Payer: Medicare Other | Admitting: *Deleted

## 2012-01-06 DIAGNOSIS — H019 Unspecified inflammation of eyelid: Secondary | ICD-10-CM | POA: Diagnosis not present

## 2012-01-06 DIAGNOSIS — H02409 Unspecified ptosis of unspecified eyelid: Secondary | ICD-10-CM | POA: Diagnosis not present

## 2012-01-06 DIAGNOSIS — H40029 Open angle with borderline findings, high risk, unspecified eye: Secondary | ICD-10-CM | POA: Diagnosis not present

## 2012-01-06 DIAGNOSIS — H35369 Drusen (degenerative) of macula, unspecified eye: Secondary | ICD-10-CM | POA: Diagnosis not present

## 2012-01-06 DIAGNOSIS — H6120 Impacted cerumen, unspecified ear: Secondary | ICD-10-CM | POA: Diagnosis not present

## 2012-01-06 DIAGNOSIS — I4891 Unspecified atrial fibrillation: Secondary | ICD-10-CM

## 2012-01-06 LAB — POCT INR: INR: 2.3

## 2012-02-04 ENCOUNTER — Ambulatory Visit: Payer: Medicare Other | Admitting: Internal Medicine

## 2012-02-05 ENCOUNTER — Encounter: Payer: Self-pay | Admitting: Internal Medicine

## 2012-02-05 ENCOUNTER — Ambulatory Visit (INDEPENDENT_AMBULATORY_CARE_PROVIDER_SITE_OTHER): Payer: Medicare Other | Admitting: Internal Medicine

## 2012-02-05 VITALS — BP 120/60 | HR 75 | Temp 97.6°F | Ht 63.0 in | Wt 190.0 lb

## 2012-02-05 DIAGNOSIS — I4891 Unspecified atrial fibrillation: Secondary | ICD-10-CM | POA: Diagnosis not present

## 2012-02-05 DIAGNOSIS — I5042 Chronic combined systolic (congestive) and diastolic (congestive) heart failure: Secondary | ICD-10-CM | POA: Diagnosis not present

## 2012-02-05 DIAGNOSIS — I10 Essential (primary) hypertension: Secondary | ICD-10-CM

## 2012-02-05 NOTE — Progress Notes (Signed)
Subjective:    Patient ID: [name], male    DOB: Jun 17, 1917, 78 y.o.   MRN: [id number]  HPI  Here for follow up - reviewed chronic medical issues:  B ankle DJD  - limits ambulation - wears braces when up and about - ambulates with RW or scooter when at home - also B hand DJD changes  CAF - chronic anticoag, follows with [facility] - no palpitations, swelling or chest pain  Prostate ca - dx 12/06 - ongoing uro care with Lupron q 96mo - last PSA normal per report  ICM - chronic mild hypotension but asymptomatic - no shortness of breath or edema change (wears chronic BLE compression hose)  CAD -s/p CABG 1974 - no anginal symptoms - the patient reports compliance with medication(s) as prescribed. Denies adverse side effects.  Past Medical History  Diagnosis Date  . Paroxysmal atrial fibrillation   . CAD (coronary artery disease)     CABG 08/1973  . CHF (congestive heart failure)     ICM  . Pacemaker 09/2003  . Hypertension   . Hyperlipidemia   . Unsteady gait     due to B ankle DJD and instability  . Memory loss   . Chronic renal insufficiency   . Barrett's esophagus   . Diverticulosis of colon   . GERD (gastroesophageal reflux disease)   . Adenocarcinoma of prostate 10/2005    lupron injections q57mo  . Atrial fibrillation     chronic anticoag  . Arthritis     ?rheumatoid, never dx or on DMARDs  . Osteoporosis     Review of Systems  Respiratory: Negative for cough and shortness of breath.   Cardiovascular: Negative for chest pain or palpitations .  Neurological: Negative for dizziness.       Objective:   Physical Exam  BP 120/60  Pulse 75  Temp(Src) 97.6 F (36.4 C) (Oral)  Ht 5\' 3"  (1.6 m)  Wt 190 lb (86.183 kg)  BMI 33.66 kg/m2  SpO2 96% Wt Readings from Last 3 Encounters:  02/05/12 190 lb (86.183 kg)  12/11/11 195 lb (88.451 kg)  09/11/11 194 lb (87.998 kg)   Constitutional: spry older man;. appears well-developed and well-nourished. No distress.  Uses RW for ambulation assistance - dtr at side Neck: Normal range of motion. Neck supple. No JVD present. No carotid bruits,  No thyromegaly present.  Cardiovascular: Normal rate, regular rhythm and normal heart sounds.  No murmur heard. trace BLE edema (wearing calf high compression hose) Pulmonary/Chest: Effort normal and breath sounds normal. No respiratory distress. no wheezes. Musculoskeletal: chronic deformaties B hands R>L and MCP and R wrist consistent with Rheumatoid Arthritis - no hot synovitis or redness, B abkles with degenerative changes - wears B ankle lateral brace supports in shoes Psychiatric: he has a normal mood and affect. behavior is normal. Judgment and thought content normal.   Lab Results  Component Value Date   WBC 7.7 03/04/2010   HGB 13.0 03/04/2010   HCT 39.4 03/04/2010   PLT 230 03/04/2010   CHOL 218* 09/12/2011   TRIG 252.0* 09/12/2011   HDL 42.50 09/12/2011   LDLDIRECT 131.6 09/12/2011   ALT 27 09/12/2011   AST 40* 09/12/2011   NA 140 09/12/2011   K 3.9 09/12/2011   CL 104 09/12/2011   CREATININE 1.4 09/12/2011   BUN 25* 09/12/2011   CO2 27 09/12/2011   TSH 4.027 03/04/2010   INR 2.3 01/06/2012       Assessment &  Plan:  See problem list. Medications and labs reviewed today.

## 2012-02-05 NOTE — Patient Instructions (Signed)
It was good to see you today. We have reviewed your prior records including labs and tests today Medications reviewed, no changes at this time. Please schedule followup in 6 months, call sooner if problems.  

## 2012-02-05 NOTE — Assessment & Plan Note (Signed)
Chronic ischemic CM without exacerbation symptoms stable, euvolemic on exam The current medical regimen is effective;  continue present plan and medications.

## 2012-02-05 NOTE — Assessment & Plan Note (Signed)
BP Readings from Last 3 Encounters:  02/05/12 120/60  12/11/11 102/56  09/11/11 118/58   The current medical regimen is effective;  continue present plan and medications.

## 2012-02-05 NOTE — Assessment & Plan Note (Signed)
Chronic anticoag at [facility] - rate controlled and asymptomatic  

## 2012-02-17 ENCOUNTER — Ambulatory Visit (INDEPENDENT_AMBULATORY_CARE_PROVIDER_SITE_OTHER): Payer: Medicare Other | Admitting: *Deleted

## 2012-02-17 DIAGNOSIS — I4891 Unspecified atrial fibrillation: Secondary | ICD-10-CM | POA: Diagnosis not present

## 2012-03-08 ENCOUNTER — Other Ambulatory Visit: Payer: Self-pay | Admitting: *Deleted

## 2012-03-08 ENCOUNTER — Other Ambulatory Visit: Payer: Self-pay | Admitting: Internal Medicine

## 2012-03-08 MED ORDER — METOPROLOL TARTRATE 25 MG PO TABS: 25.0000 mg | ORAL_TABLET | Freq: Two times a day (BID) | ORAL | Status: DC

## 2012-03-08 MED ORDER — POTASSIUM CHLORIDE CRYS ER 10 MEQ PO TBCR: 10.0000 meq | EXTENDED_RELEASE_TABLET | Freq: Every day | ORAL | Status: DC

## 2012-03-26 ENCOUNTER — Encounter: Payer: Self-pay | Admitting: Internal Medicine

## 2012-03-26 ENCOUNTER — Ambulatory Visit (INDEPENDENT_AMBULATORY_CARE_PROVIDER_SITE_OTHER): Payer: Medicare Other | Admitting: Internal Medicine

## 2012-03-26 VITALS — BP 102/62 | HR 79 | Temp 97.4°F | Ht 63.0 in | Wt 192.2 lb

## 2012-03-26 DIAGNOSIS — M25551 Pain in right hip: Secondary | ICD-10-CM

## 2012-03-26 DIAGNOSIS — I4891 Unspecified atrial fibrillation: Secondary | ICD-10-CM | POA: Diagnosis not present

## 2012-03-26 DIAGNOSIS — R29898 Other symptoms and signs involving the musculoskeletal system: Secondary | ICD-10-CM | POA: Diagnosis not present

## 2012-03-26 DIAGNOSIS — M4800 Spinal stenosis, site unspecified: Secondary | ICD-10-CM

## 2012-03-26 DIAGNOSIS — M25559 Pain in unspecified hip: Secondary | ICD-10-CM | POA: Diagnosis not present

## 2012-03-26 NOTE — Assessment & Plan Note (Signed)
Chronic anticoag at [facility] - rate controlled and asymptomatic  Notes self limited bleeding with BM, mild Advised to notify CC at next inr check and call for refer to GI or surg if rectal bleed occurs outside of BM (decleines eval today)

## 2012-03-26 NOTE — Progress Notes (Signed)
  Subjective:    Patient ID: [name], male    DOB: 1917/04/22, 78 y.o.   MRN: [id number]  HPI  Requests PT to help strengthen hips/knees Denies falls, new back pain or joint swelling  Past Medical History  Diagnosis Date  . Paroxysmal atrial fibrillation   . CAD (coronary artery disease)     CABG 08/1973  . CHF (congestive heart failure)     ICM  . Pacemaker 09/2003  . Hypertension   . Hyperlipidemia   . Unsteady gait     due to B ankle DJD and instability  . Memory loss   . Chronic renal insufficiency   . Barrett's esophagus   . Diverticulosis of colon   . GERD (gastroesophageal reflux disease)   . Adenocarcinoma of prostate 10/2005    lupron injections q25mo  . Atrial fibrillation     chronic anticoag  . Arthritis     ?rheumatoid, never dx or on DMARDs  . Osteoporosis      Review of Systems  Constitutional: Negative for fever and activity change.  Cardiovascular: Negative for leg swelling.  Musculoskeletal: Positive for arthralgias. Negative for myalgias, back pain and joint swelling.       Objective:   Physical Exam BP 102/62  Pulse 79  Temp(Src) 97.4 F (36.3 C) (Oral)  Ht 5\' 3"  (1.6 m)  Wt 192 lb 3.2 oz (87.181 kg)  BMI 34.05 kg/m2  SpO2 95% Wt Readings from Last 3 Encounters:  03/26/12 192 lb 3.2 oz (87.181 kg)  02/05/12 190 lb (86.183 kg)  12/11/11 195 lb (88.451 kg)   Constitutional:  He appears well-developed and well-nourished. No distress. Dtr at side Cardiovascular: Normal rate, regular rhythm and normal heart sounds.  No murmur heard. no BLE edema Pulmonary/Chest: Effort normal and breath sounds normal. No respiratory distress. no wheezes.  Musculoskeletal: Back: full range of motion of thoracic and lumbar spine. Non tender to palpation. Sensation intact in all dermatomes of the lower extremities. Diminished quad strength B to manual muscle testing. patient is not able to heel toe walk and ambulates with antalgic gait, using RW for  support. Patient exhibits no joint swelling.  Neurological: he is alert and oriented to person, place, and time. No cranial nerve deficit. Coordination normal.  Psychiatric: he has a normal mood and affect. behavior is normal. Judgment and thought content normal.   Lab Results  Component Value Date   WBC 7.7 03/04/2010   HGB 13.0 03/04/2010   HCT 39.4 03/04/2010   PLT 230 03/04/2010   GLUCOSE 115* 09/12/2011   CHOL 218* 09/12/2011   TRIG 252.0* 09/12/2011   HDL 42.50 09/12/2011   LDLDIRECT 131.6 09/12/2011   LDLCALC 107* 03/04/2010   ALT 27 09/12/2011   AST 40* 09/12/2011   NA 140 09/12/2011   K 3.9 09/12/2011   CL 104 09/12/2011   CREATININE 1.4 09/12/2011   BUN 25* 09/12/2011   CO2 27 09/12/2011   TSH 4.027 03/04/2010   INR 2.5 02/17/2012      Assessment & Plan:   Polyarthralgia, esp B hips BLE weakness suspect spinal stenosis  pt declines eval or meds for same no falls -  - refuses consideration of ESI  Refer for PT - pt to call if worse or unimproved

## 2012-03-30 ENCOUNTER — Ambulatory Visit (INDEPENDENT_AMBULATORY_CARE_PROVIDER_SITE_OTHER): Payer: Medicare Other | Admitting: Pharmacist

## 2012-03-30 DIAGNOSIS — I4891 Unspecified atrial fibrillation: Secondary | ICD-10-CM

## 2012-03-30 LAB — POCT INR: INR: 3

## 2012-04-05 ENCOUNTER — Encounter: Payer: Self-pay | Admitting: *Deleted

## 2012-04-20 ENCOUNTER — Ambulatory Visit: Payer: Medicare Other | Attending: Internal Medicine | Admitting: Physical Therapy

## 2012-04-20 DIAGNOSIS — M545 Low back pain, unspecified: Secondary | ICD-10-CM | POA: Insufficient documentation

## 2012-04-20 DIAGNOSIS — R29898 Other symptoms and signs involving the musculoskeletal system: Secondary | ICD-10-CM | POA: Insufficient documentation

## 2012-04-20 DIAGNOSIS — IMO0001 Reserved for inherently not codable concepts without codable children: Secondary | ICD-10-CM | POA: Diagnosis not present

## 2012-04-26 ENCOUNTER — Ambulatory Visit: Payer: Medicare Other | Admitting: Rehabilitation

## 2012-04-26 DIAGNOSIS — IMO0001 Reserved for inherently not codable concepts without codable children: Secondary | ICD-10-CM | POA: Diagnosis not present

## 2012-04-26 DIAGNOSIS — R29898 Other symptoms and signs involving the musculoskeletal system: Secondary | ICD-10-CM | POA: Diagnosis not present

## 2012-04-26 DIAGNOSIS — M545 Low back pain: Secondary | ICD-10-CM | POA: Diagnosis not present

## 2012-04-28 ENCOUNTER — Ambulatory Visit: Payer: Medicare Other

## 2012-04-28 DIAGNOSIS — M545 Low back pain: Secondary | ICD-10-CM | POA: Diagnosis not present

## 2012-04-28 DIAGNOSIS — IMO0001 Reserved for inherently not codable concepts without codable children: Secondary | ICD-10-CM | POA: Diagnosis not present

## 2012-04-28 DIAGNOSIS — R29898 Other symptoms and signs involving the musculoskeletal system: Secondary | ICD-10-CM | POA: Diagnosis not present

## 2012-04-30 DIAGNOSIS — H6120 Impacted cerumen, unspecified ear: Secondary | ICD-10-CM | POA: Diagnosis not present

## 2012-05-03 ENCOUNTER — Ambulatory Visit: Payer: Medicare Other | Admitting: Rehabilitation

## 2012-05-03 DIAGNOSIS — R29898 Other symptoms and signs involving the musculoskeletal system: Secondary | ICD-10-CM | POA: Diagnosis not present

## 2012-05-03 DIAGNOSIS — M545 Low back pain: Secondary | ICD-10-CM | POA: Diagnosis not present

## 2012-05-03 DIAGNOSIS — IMO0001 Reserved for inherently not codable concepts without codable children: Secondary | ICD-10-CM | POA: Diagnosis not present

## 2012-05-04 ENCOUNTER — Other Ambulatory Visit: Payer: Self-pay | Admitting: Internal Medicine

## 2012-05-05 ENCOUNTER — Ambulatory Visit: Payer: Medicare Other | Admitting: Rehabilitation

## 2012-05-05 DIAGNOSIS — R29898 Other symptoms and signs involving the musculoskeletal system: Secondary | ICD-10-CM | POA: Diagnosis not present

## 2012-05-05 DIAGNOSIS — M545 Low back pain: Secondary | ICD-10-CM | POA: Diagnosis not present

## 2012-05-05 DIAGNOSIS — IMO0001 Reserved for inherently not codable concepts without codable children: Secondary | ICD-10-CM | POA: Diagnosis not present

## 2012-05-07 DIAGNOSIS — C61 Malignant neoplasm of prostate: Secondary | ICD-10-CM | POA: Diagnosis not present

## 2012-05-11 ENCOUNTER — Ambulatory Visit: Payer: Medicare Other | Attending: Internal Medicine | Admitting: Rehabilitation

## 2012-05-11 ENCOUNTER — Ambulatory Visit (INDEPENDENT_AMBULATORY_CARE_PROVIDER_SITE_OTHER): Payer: Medicare Other | Admitting: *Deleted

## 2012-05-11 DIAGNOSIS — IMO0001 Reserved for inherently not codable concepts without codable children: Secondary | ICD-10-CM | POA: Insufficient documentation

## 2012-05-11 DIAGNOSIS — M545 Low back pain, unspecified: Secondary | ICD-10-CM | POA: Insufficient documentation

## 2012-05-11 DIAGNOSIS — I4891 Unspecified atrial fibrillation: Secondary | ICD-10-CM

## 2012-05-11 DIAGNOSIS — R29898 Other symptoms and signs involving the musculoskeletal system: Secondary | ICD-10-CM | POA: Diagnosis not present

## 2012-05-11 DIAGNOSIS — I509 Heart failure, unspecified: Secondary | ICD-10-CM | POA: Diagnosis not present

## 2012-05-11 LAB — POCT INR: INR: 3.3

## 2012-05-12 ENCOUNTER — Encounter: Payer: Self-pay | Admitting: Internal Medicine

## 2012-05-12 ENCOUNTER — Ambulatory Visit (INDEPENDENT_AMBULATORY_CARE_PROVIDER_SITE_OTHER): Payer: Medicare Other | Admitting: *Deleted

## 2012-05-12 DIAGNOSIS — I442 Atrioventricular block, complete: Secondary | ICD-10-CM | POA: Diagnosis not present

## 2012-05-12 LAB — PACEMAKER DEVICE OBSERVATION
AL AMPLITUDE: 2 mv
AL THRESHOLD: 1 V
BAMS-0001: 175 {beats}/min
RV LEAD AMPLITUDE: 11.2 mv
RV LEAD IMPEDENCE PM: 561 Ohm
RV LEAD THRESHOLD: 1 V

## 2012-05-12 NOTE — Progress Notes (Signed)
PPM check 

## 2012-05-17 ENCOUNTER — Ambulatory Visit: Payer: Medicare Other | Admitting: Rehabilitation

## 2012-05-18 DIAGNOSIS — C61 Malignant neoplasm of prostate: Secondary | ICD-10-CM | POA: Diagnosis not present

## 2012-05-24 ENCOUNTER — Ambulatory Visit: Payer: Medicare Other | Admitting: Rehabilitation

## 2012-05-24 ENCOUNTER — Other Ambulatory Visit: Payer: Self-pay | Admitting: Internal Medicine

## 2012-05-26 ENCOUNTER — Ambulatory Visit: Payer: Medicare Other | Admitting: Rehabilitation

## 2012-05-27 ENCOUNTER — Ambulatory Visit: Payer: Medicare Other | Admitting: Rehabilitation

## 2012-05-31 ENCOUNTER — Ambulatory Visit: Payer: Medicare Other | Admitting: Rehabilitation

## 2012-06-01 ENCOUNTER — Ambulatory Visit (INDEPENDENT_AMBULATORY_CARE_PROVIDER_SITE_OTHER): Payer: Medicare Other | Admitting: *Deleted

## 2012-06-01 DIAGNOSIS — I4891 Unspecified atrial fibrillation: Secondary | ICD-10-CM

## 2012-06-07 ENCOUNTER — Ambulatory Visit: Payer: Medicare Other | Admitting: Rehabilitation

## 2012-06-09 ENCOUNTER — Ambulatory Visit: Payer: Medicare Other | Admitting: Rehabilitation

## 2012-06-14 ENCOUNTER — Ambulatory Visit: Payer: Medicare Other | Attending: Internal Medicine | Admitting: Rehabilitation

## 2012-06-14 DIAGNOSIS — R29898 Other symptoms and signs involving the musculoskeletal system: Secondary | ICD-10-CM | POA: Insufficient documentation

## 2012-06-14 DIAGNOSIS — M545 Low back pain, unspecified: Secondary | ICD-10-CM | POA: Insufficient documentation

## 2012-06-14 DIAGNOSIS — IMO0001 Reserved for inherently not codable concepts without codable children: Secondary | ICD-10-CM | POA: Insufficient documentation

## 2012-06-15 ENCOUNTER — Ambulatory Visit (INDEPENDENT_AMBULATORY_CARE_PROVIDER_SITE_OTHER): Payer: Medicare Other | Admitting: Cardiology

## 2012-06-15 ENCOUNTER — Encounter: Payer: Self-pay | Admitting: Cardiology

## 2012-06-15 VITALS — BP 100/52 | HR 70 | Ht 63.0 in | Wt 186.0 lb

## 2012-06-15 DIAGNOSIS — Z954 Presence of other heart-valve replacement: Secondary | ICD-10-CM

## 2012-06-15 DIAGNOSIS — I509 Heart failure, unspecified: Secondary | ICD-10-CM

## 2012-06-15 DIAGNOSIS — Z950 Presence of cardiac pacemaker: Secondary | ICD-10-CM

## 2012-06-15 DIAGNOSIS — N189 Chronic kidney disease, unspecified: Secondary | ICD-10-CM

## 2012-06-15 DIAGNOSIS — I4891 Unspecified atrial fibrillation: Secondary | ICD-10-CM

## 2012-06-15 DIAGNOSIS — E785 Hyperlipidemia, unspecified: Secondary | ICD-10-CM

## 2012-06-15 DIAGNOSIS — I2510 Atherosclerotic heart disease of native coronary artery without angina pectoris: Secondary | ICD-10-CM | POA: Diagnosis not present

## 2012-06-15 DIAGNOSIS — Z952 Presence of prosthetic heart valve: Secondary | ICD-10-CM

## 2012-06-15 DIAGNOSIS — I252 Old myocardial infarction: Secondary | ICD-10-CM | POA: Diagnosis not present

## 2012-06-15 DIAGNOSIS — I5042 Chronic combined systolic (congestive) and diastolic (congestive) heart failure: Secondary | ICD-10-CM

## 2012-06-15 DIAGNOSIS — I10 Essential (primary) hypertension: Secondary | ICD-10-CM

## 2012-06-15 NOTE — Assessment & Plan Note (Signed)
Stable. With no signs or symptoms of ischemia. No change in medications. Medical therapy at this time only.

## 2012-06-15 NOTE — Patient Instructions (Addendum)
Your physician recommends that you continue on your current medications as directed. Please refer to the Current Medication list given to you today.  Your physician wants you to follow-up in: 6 months with Dr. Wall. You will receive a reminder letter in the mail two months in advance. If you don't receive a letter, please call our office to schedule the follow-up appointment.  

## 2012-06-15 NOTE — Progress Notes (Signed)
HPI [name] returns for the evaluation and management of his multiple cardiac problems. Despite being 64, he is actually doing some exercise to strengthen his legs. He is more stable today coming in seen him in a while. He can stand up without having to push on his walker. He denies any chest pain or any shortness of breath. He denies orthopnea, or increased edema. He wears support hose. He denies any falls. He remains on Coumadin.  Past Medical History  Diagnosis Date  . Paroxysmal atrial fibrillation   . CAD (coronary artery disease)     CABG 08/1973  . CHF (congestive heart failure)     ICM  . Pacemaker 09/2003  . Hypertension   . Hyperlipidemia   . Unsteady gait     due to B ankle DJD and instability  . Memory loss   . Chronic renal insufficiency   . Barrett's esophagus   . Diverticulosis of colon   . GERD (gastroesophageal reflux disease)   . Adenocarcinoma of prostate 10/2005    lupron injections q67mo  . Atrial fibrillation     chronic anticoag  . Arthritis     ?rheumatoid, never dx or on DMARDs  . Osteoporosis     Current Outpatient Prescriptions  Medication Sig Dispense Refill  . alendronate (FOSAMAX) 70 MG tablet Take 70 mg by mouth every 7 (seven) days. Take with a full glass of water on an empty stomach.       Marland Kitchen aspirin 81 MG tablet Take 81 mg by mouth daily.      . benazepril (LOTENSIN) 5 MG tablet TAKE 1/2 TABLET BY MOUTH ONCE A DAY  45 tablet  1  . furosemide (LASIX) 20 MG tablet Take 20 mg by mouth daily.       . metoprolol tartrate (LOPRESSOR) 25 MG tablet Take 1 tablet (25 mg total) by mouth 2 (two) times daily.  60 tablet  5  . metoprolol tartrate (LOPRESSOR) 25 MG tablet TAKE 1 TABLET (25 MG TOTAL) BY MOUTH 2 (TWO) TIMES DAILY.  60 tablet  5  . nitroGLYCERIN (NITROSTAT) 0.4 MG SL tablet Place 0.4 mg under the tongue every 5 (five) minutes as needed.        Marland Kitchen omeprazole (PRILOSEC) 20 MG capsule Take 20 mg by mouth every other day.       . potassium chloride  (K-DUR,KLOR-CON) 10 MEQ tablet Take 1 tablet (10 mEq total) by mouth daily.  30 tablet  5  . pravastatin (PRAVACHOL) 10 MG tablet Take 1 tablet (10 mg total) by mouth every evening.  30 tablet  5  . warfarin (COUMADIN) 1 MG tablet TAKE AS DIRECTED BY ANTICOAGULATION CLINIC  45 tablet  3  . DISCONTD: potassium chloride (KLOR-CON) 10 MEQ CR tablet Take 1 tablet (10 mEq total) by mouth daily.  30 tablet  3    No Known Allergies  No family history on file.  History   Social History  . Marital Status: Married    Spouse Name: N/A    Number of Children: N/A  . Years of Education: N/A   Occupational History  . Not on file.   Social History Main Topics  . Smoking status: Former Smoker    Quit date: 08/05/1986  . Smokeless tobacco: Not on file  . Alcohol Use: Yes     samll glass of wine  . Drug Use: Not on file  . Sexually Active: Not on file   Other Topics Concern  . Not on  file   Social History Narrative   Married  - lives with spouse and supportive daugherFormer Smoker -  quit tobacco 25 years ago  Alcohol use-yes (small glass of wine)          ROS ALL NEGATIVE EXCEPT THOSE NOTED IN HPI  PE  General Appearance: well developed, well nourished in no acute distress, looks younger than stated age HEENT: symmetrical face, PERRLA, good dentition  Neck: no JVD, thyromegaly, or adenopathy, trachea midline Chest: symmetric without deformity Cardiac: PMI non-displaced, RRR, normal S1, S2, no gallop, 2/6 systolic murmur at the apex, stable Lung: clear to ausculation and percussion Vascular: all pulses full Abdominal: nondistended, nontender, good bowel sounds, no HSM, no bruits Extremities: no cyanosis, clubbing or edema, no sign of DVT, no varicosities  Skin: normal color, no rashes Neuro: alert and oriented x 3, non-focal Pysch: normal affect  EKG Normal sinus rhythm, 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 BMET    Component Value  Date/Time   NA 140 09/12/2011 0926   K 3.9 09/12/2011 0926   CL 104 09/12/2011 0926   CO2 27 09/12/2011 0926   GLUCOSE 115* 09/12/2011 0926   BUN 25* 09/12/2011 0926   CREATININE 1.4 09/12/2011 0926   CALCIUM 9.3 09/12/2011 0926   GFRNONAA 51 01-26-202010 0818   GFRAA 61 01-26-202010 0818    Lipid Panel     Component Value Date/Time   CHOL 218* 09/12/2011 0926   TRIG 252.0* 09/12/2011 0926   HDL 42.50 09/12/2011 0926   CHOLHDL 5 09/12/2011 0926   VLDL 50.4* 09/12/2011 0926   LDLCALC 107* 03/04/2010 2113    CBC    Component Value Date/Time   WBC 7.7 03/04/2010 2113   RBC 4.40 03/04/2010 2113   HGB 13.0 03/04/2010 2113   HCT 39.4 03/04/2010 2113   PLT 230 03/04/2010 2113   MCV 89.5 03/04/2010 2113   MCHC 33.0 03/04/2010 2113   RDW 14.6 03/04/2010 2113   LYMPHSABS 2.4 03/04/2010 2113   MONOABS 1.0 03/04/2010 2113   EOSABS 0.2 03/04/2010 2113   BASOSABS 0.1 03/04/2010 2113

## 2012-06-15 NOTE — Assessment & Plan Note (Signed)
He is in sinus rhythm today. Continue current meds including anticoagulation. Fall risk is low.

## 2012-06-15 NOTE — Assessment & Plan Note (Signed)
Remarkably stable. Medications reviewed. No changes made. Return the office in 6 months. Continue to follow with primary care.

## 2012-06-16 ENCOUNTER — Ambulatory Visit: Payer: Medicare Other | Admitting: Rehabilitation

## 2012-06-21 ENCOUNTER — Other Ambulatory Visit: Payer: Self-pay | Admitting: *Deleted

## 2012-06-21 MED ORDER — PRAVASTATIN SODIUM 10 MG PO TABS: 10.0000 mg | ORAL_TABLET | Freq: Every evening | ORAL | Status: DC

## 2012-06-29 ENCOUNTER — Ambulatory Visit (INDEPENDENT_AMBULATORY_CARE_PROVIDER_SITE_OTHER): Payer: Medicare Other | Admitting: Pharmacist

## 2012-06-29 DIAGNOSIS — H6120 Impacted cerumen, unspecified ear: Secondary | ICD-10-CM | POA: Diagnosis not present

## 2012-06-29 DIAGNOSIS — I4891 Unspecified atrial fibrillation: Secondary | ICD-10-CM | POA: Diagnosis not present

## 2012-06-29 LAB — POCT INR: INR: 2.9

## 2012-06-30 DIAGNOSIS — H40029 Open angle with borderline findings, high risk, unspecified eye: Secondary | ICD-10-CM | POA: Diagnosis not present

## 2012-06-30 DIAGNOSIS — Z961 Presence of intraocular lens: Secondary | ICD-10-CM | POA: Diagnosis not present

## 2012-06-30 DIAGNOSIS — H04129 Dry eye syndrome of unspecified lacrimal gland: Secondary | ICD-10-CM | POA: Diagnosis not present

## 2012-06-30 DIAGNOSIS — H35369 Drusen (degenerative) of macula, unspecified eye: Secondary | ICD-10-CM | POA: Diagnosis not present

## 2012-07-27 ENCOUNTER — Ambulatory Visit (INDEPENDENT_AMBULATORY_CARE_PROVIDER_SITE_OTHER): Payer: Medicare Other

## 2012-07-27 DIAGNOSIS — I4891 Unspecified atrial fibrillation: Secondary | ICD-10-CM

## 2012-07-27 LAB — POCT INR: INR: 3.1

## 2012-08-04 DIAGNOSIS — Z23 Encounter for immunization: Secondary | ICD-10-CM | POA: Diagnosis not present

## 2012-08-05 ENCOUNTER — Ambulatory Visit (INDEPENDENT_AMBULATORY_CARE_PROVIDER_SITE_OTHER): Payer: Medicare Other | Admitting: Internal Medicine

## 2012-08-05 ENCOUNTER — Encounter: Payer: Self-pay | Admitting: Internal Medicine

## 2012-08-05 VITALS — BP 104/60 | HR 76 | Temp 96.7°F | Resp 16 | Wt 184.0 lb

## 2012-08-05 DIAGNOSIS — I4891 Unspecified atrial fibrillation: Secondary | ICD-10-CM | POA: Diagnosis not present

## 2012-08-05 DIAGNOSIS — E785 Hyperlipidemia, unspecified: Secondary | ICD-10-CM

## 2012-08-05 DIAGNOSIS — I10 Essential (primary) hypertension: Secondary | ICD-10-CM | POA: Diagnosis not present

## 2012-08-05 DIAGNOSIS — M19049 Primary osteoarthritis, unspecified hand: Secondary | ICD-10-CM

## 2012-08-05 NOTE — Patient Instructions (Addendum)
It was good to see you today. We have reviewed your prior records including labs and tests today Medications reviewed, no changes at this time. Please schedule followup in 6 months for cholesterol check and review, call sooner if problems. The [facility] will schedule your next check at our office rather than [address]

## 2012-08-05 NOTE — Progress Notes (Signed)
  Subjective:    Patient ID: [name], male    DOB: 29-Oct-1917, 78 y.o.   MRN: [id number]  HPI  Here for follow up -  Past Medical History  Diagnosis Date  . Paroxysmal atrial fibrillation   . CAD (coronary artery disease)     CABG 08/1973  . CHF (congestive heart failure)     ICM  . Pacemaker 09/2003  . Hypertension   . Hyperlipidemia   . Unsteady gait     due to B ankle DJD and instability  . Memory loss   . Chronic renal insufficiency   . Barrett's esophagus   . Diverticulosis of colon   . GERD (gastroesophageal reflux disease)   . Adenocarcinoma of prostate 10/2005    lupron injections q72mo  . Atrial fibrillation     chronic anticoag  . Arthritis     ?rheumatoid, never dx or on DMARDs  . Osteoporosis      Review of Systems  Constitutional: Negative for fever and activity change.  Cardiovascular: Negative for leg swelling.  Musculoskeletal: Positive for arthralgias. Negative for myalgias, back pain and joint swelling.       Objective:   Physical Exam  BP 104/60  Pulse 76  Temp 96.7 F (35.9 C) (Oral)  Resp 16  Wt 184 lb (83.462 kg)  SpO2 94% Wt Readings from Last 3 Encounters:  08/05/12 184 lb (83.462 kg)  06/15/12 186 lb (84.369 kg)  03/26/12 192 lb 3.2 oz (87.181 kg)   Constitutional:  He is overweight, but appears well-developed and well-nourished. No distress. Uses rolling walker for support Cardiovascular: Normal rate, irregular rhythm and normal heart sounds.  No murmur heard. no BLE edema Pulmonary/Chest: Effort normal and breath sounds normal. No respiratory distress. no wheezes.  MSkel: arthritic changes both hands - B ankle brace supports (all unchanged from baseline) - no effusions or gross defomrities Neurological: he is alert and oriented to person, place, and time. No cranial nerve deficit. Coordination normal.  Psychiatric: he has a normal mood and affect. behavior is normal. Judgment and thought content normal.   Lab Results    Component Value Date   WBC 7.7 03/04/2010   HGB 13.0 03/04/2010   HCT 39.4 03/04/2010   PLT 230 03/04/2010   GLUCOSE 115* 09/12/2011   CHOL 218* 09/12/2011   TRIG 252.0* 09/12/2011   HDL 42.50 09/12/2011   LDLDIRECT 131.6 09/12/2011   LDLCALC 107* 03/04/2010   ALT 27 09/12/2011   AST 40* 09/12/2011   NA 140 09/12/2011   K 3.9 09/12/2011   CL 104 09/12/2011   CREATININE 1.4 09/12/2011   BUN 25* 09/12/2011   CO2 27 09/12/2011   TSH 4.027 03/04/2010   INR 3.1 07/27/2012      Assessment & Plan:   See problem list. Medications and labs reviewed today.  Time spent with pt today 25 minutes, greater than 50% time spent counseling patient on a fib and anticoag (will check here at [facility]) and medication review. Also review of prior records

## 2012-08-05 NOTE — Assessment & Plan Note (Signed)
Chronic anticoag - rate controlled and asymptomatic   

## 2012-08-05 NOTE — Assessment & Plan Note (Signed)
Degenerative change B hands R>L esp MCP and wrist; also B ankles Would suspect undx/untx RA but symptoms overall quiet without exac in many years No change tx 

## 2012-08-05 NOTE — Assessment & Plan Note (Signed)
On statin, stable dose for years - 2ndary tx for CAD Check annual labs and adjust prn  

## 2012-08-05 NOTE — Assessment & Plan Note (Signed)
BP Readings from Last 3 Encounters:  08/05/12 104/60  06/15/12 100/52  03/26/12 102/62   The current medical regimen is effective;  continue present plan and medications.

## 2012-08-16 ENCOUNTER — Ambulatory Visit (INDEPENDENT_AMBULATORY_CARE_PROVIDER_SITE_OTHER): Payer: Medicare Other | Admitting: *Deleted

## 2012-08-16 DIAGNOSIS — I4891 Unspecified atrial fibrillation: Secondary | ICD-10-CM

## 2012-08-16 DIAGNOSIS — Z950 Presence of cardiac pacemaker: Secondary | ICD-10-CM | POA: Diagnosis not present

## 2012-08-19 LAB — REMOTE PACEMAKER DEVICE
AL AMPLITUDE: 2.8 mv
BATTERY VOLTAGE: 2.68 V
RV LEAD AMPLITUDE: 22.4 mv
RV LEAD IMPEDENCE PM: 612 Ohm
VENTRICULAR PACING PM: 39

## 2012-08-24 ENCOUNTER — Ambulatory Visit (INDEPENDENT_AMBULATORY_CARE_PROVIDER_SITE_OTHER): Payer: Medicare Other | Admitting: General Practice

## 2012-08-24 DIAGNOSIS — I4891 Unspecified atrial fibrillation: Secondary | ICD-10-CM

## 2012-08-26 ENCOUNTER — Encounter: Payer: Self-pay | Admitting: *Deleted

## 2012-08-31 DIAGNOSIS — H6120 Impacted cerumen, unspecified ear: Secondary | ICD-10-CM | POA: Diagnosis not present

## 2012-09-06 ENCOUNTER — Other Ambulatory Visit: Payer: Self-pay | Admitting: Internal Medicine

## 2012-09-06 ENCOUNTER — Encounter: Payer: Self-pay | Admitting: Internal Medicine

## 2012-10-05 ENCOUNTER — Other Ambulatory Visit: Payer: Self-pay | Admitting: Internal Medicine

## 2012-10-05 ENCOUNTER — Ambulatory Visit (INDEPENDENT_AMBULATORY_CARE_PROVIDER_SITE_OTHER): Payer: Medicare Other | Admitting: General Practice

## 2012-10-05 DIAGNOSIS — I4891 Unspecified atrial fibrillation: Secondary | ICD-10-CM | POA: Diagnosis not present

## 2012-10-05 LAB — POCT INR: INR: 2.4

## 2012-10-18 ENCOUNTER — Other Ambulatory Visit: Payer: Self-pay | Admitting: Internal Medicine

## 2012-10-20 ENCOUNTER — Other Ambulatory Visit: Payer: Self-pay | Admitting: *Deleted

## 2012-10-20 MED ORDER — WARFARIN SODIUM 1 MG PO TABS: 1.0000 mg | ORAL_TABLET | ORAL | Status: DC

## 2012-10-21 ENCOUNTER — Other Ambulatory Visit: Payer: Self-pay | Admitting: General Practice

## 2012-10-21 MED ORDER — WARFARIN SODIUM 1 MG PO TABS: 1.0000 mg | ORAL_TABLET | ORAL | Status: DC

## 2012-11-11 ENCOUNTER — Encounter: Payer: Self-pay | Admitting: Internal Medicine

## 2012-11-11 ENCOUNTER — Ambulatory Visit (INDEPENDENT_AMBULATORY_CARE_PROVIDER_SITE_OTHER): Payer: Medicare Other | Admitting: Internal Medicine

## 2012-11-11 ENCOUNTER — Other Ambulatory Visit (INDEPENDENT_AMBULATORY_CARE_PROVIDER_SITE_OTHER): Payer: Medicare Other

## 2012-11-11 VITALS — BP 119/65 | HR 68 | Wt 184.0 lb

## 2012-11-11 VITALS — BP 100/60 | HR 71 | Temp 97.6°F | Ht 63.0 in | Wt 186.4 lb

## 2012-11-11 DIAGNOSIS — I4891 Unspecified atrial fibrillation: Secondary | ICD-10-CM

## 2012-11-11 DIAGNOSIS — Z950 Presence of cardiac pacemaker: Secondary | ICD-10-CM

## 2012-11-11 DIAGNOSIS — H6120 Impacted cerumen, unspecified ear: Secondary | ICD-10-CM | POA: Diagnosis not present

## 2012-11-11 DIAGNOSIS — R198 Other specified symptoms and signs involving the digestive system and abdomen: Secondary | ICD-10-CM

## 2012-11-11 DIAGNOSIS — C61 Malignant neoplasm of prostate: Secondary | ICD-10-CM | POA: Diagnosis not present

## 2012-11-11 DIAGNOSIS — K219 Gastro-esophageal reflux disease without esophagitis: Secondary | ICD-10-CM

## 2012-11-11 DIAGNOSIS — Z7902 Long term (current) use of antithrombotics/antiplatelets: Secondary | ICD-10-CM

## 2012-11-11 LAB — BASIC METABOLIC PANEL
BUN: 30 mg/dL — ABNORMAL HIGH (ref 6–23)
CO2: 28 mEq/L (ref 19–32)
Chloride: 104 mEq/L (ref 96–112)
Creatinine, Ser: 1.3 mg/dL (ref 0.4–1.5)
Glucose, Bld: 78 mg/dL (ref 70–99)
Potassium: 4.1 mEq/L (ref 3.5–5.1)

## 2012-11-11 LAB — CBC WITH DIFFERENTIAL/PLATELET
Basophils Absolute: 0.1 10*3/uL (ref 0.0–0.1)
HCT: 40.8 % (ref 39.0–52.0)
Hemoglobin: 13.7 g/dL (ref 13.0–17.0)
Lymphs Abs: 1.8 10*3/uL (ref 0.7–4.0)
MCHC: 33.7 g/dL (ref 30.0–36.0)
MCV: 88.7 fl (ref 78.0–100.0)
Monocytes Absolute: 1 10*3/uL (ref 0.1–1.0)
Monocytes Relative: 12 % (ref 3.0–12.0)
Neutro Abs: 4.8 10*3/uL (ref 1.4–7.7)
Platelets: 224 10*3/uL (ref 150.0–400.0)
RDW: 14.5 % (ref 11.5–14.6)

## 2012-11-11 LAB — PACEMAKER DEVICE OBSERVATION
AL AMPLITUDE: 4 mv
AL IMPEDENCE PM: 556 Ohm
BAMS-0002: 20 ms
BATTERY VOLTAGE: 2.67 V
RV LEAD AMPLITUDE: 22.4 mv

## 2012-11-11 LAB — TSH: TSH: 3.95 u[IU]/mL (ref 0.35–5.50)

## 2012-11-11 MED ORDER — RANITIDINE HCL 150 MG PO TABS: 150.0000 mg | ORAL_TABLET | Freq: Every day | ORAL | Status: DC

## 2012-11-11 NOTE — Patient Instructions (Signed)
It was good to see you today. We have reviewed your prior records including labs and tests today Stop omeprazole -  use generic Zanatc 150mg  at bedtime for reflux control instead - Your prescription(s) have been submitted to your pharmacy. Please take as directed and contact our office if you believe you are having problem(s) with the medication(s). Test(s) ordered today. Your results will be released to MyChart (or called to you) after review, usually within 72hours after test completion. If any changes need to be made, you will be notified at that same time. Let us know if symptoms worse or unimproved for other evaluation as needed -

## 2012-11-11 NOTE — Patient Instructions (Signed)
Your physician wants you to follow-up in: 12 months with Dr Court Joy will receive a reminder letter in the mail two months in advance. If you don't receive a letter, please call our office to schedule the follow-up appointment.  Monthly remote checks from home.  Will do one on 12/13/12

## 2012-11-11 NOTE — Progress Notes (Signed)
HPI [name] returns today for followup. He is a very pleasant 79 year old man with a history of symptomatic bradycardia, status post permanent pacemaker insertion. He also is a history of hypertension, coronary disease, and prostate cancer. In the interim, he has done well. He denies chest pain or shortness of breath. No syncope. No Known Allergies   Current Outpatient Prescriptions  Medication Sig Dispense Refill  . alendronate (FOSAMAX) 70 MG tablet Take 70 mg by mouth every 7 (seven) days. Take with a full glass of water on an empty stomach.       Marland Kitchen aspirin 81 MG tablet Take 81 mg by mouth daily.      . benazepril (LOTENSIN) 5 MG tablet TAKE 1/2 TABLET BY MOUTH ONCE A DAY  45 tablet  1  . furosemide (LASIX) 20 MG tablet Take 20 mg by mouth daily.       . metoprolol tartrate (LOPRESSOR) 25 MG tablet TAKE 1 TABLET (25 MG TOTAL) BY MOUTH 2 (TWO) TIMES DAILY.  60 tablet  4  . nitroGLYCERIN (NITROSTAT) 0.4 MG SL tablet Place 0.4 mg under the tongue every 5 (five) minutes as needed.        . potassium chloride (K-DUR,KLOR-CON) 10 MEQ tablet TAKE 1 TABLET (10 MEQ TOTAL) BY MOUTH DAILY.  30 tablet  4  . pravastatin (PRAVACHOL) 10 MG tablet TAKE ONE TABLET BY MOUTH EVERY EVENING  90 tablet  1  . ranitidine (ZANTAC) 150 MG tablet Take 1 tablet (150 mg total) by mouth at bedtime.  30 tablet  5  . warfarin (COUMADIN) 1 MG tablet Take 1 tablet (1 mg total) by mouth as directed. Take as directed by coumadin clinic.  45 tablet  3  . [DISCONTINUED] potassium chloride (KLOR-CON) 10 MEQ CR tablet Take 1 tablet (10 mEq total) by mouth daily.  30 tablet  3     Past Medical History  Diagnosis Date  . Paroxysmal atrial fibrillation   . CAD (coronary artery disease)     CABG 08/1973  . CHF (congestive heart failure)     ICM  . Pacemaker 09/2003  . Hypertension   . Hyperlipidemia   . Unsteady gait     due to B ankle DJD and instability  . Memory loss   . Chronic renal insufficiency   . Barrett's  esophagus   . Diverticulosis of colon   . GERD (gastroesophageal reflux disease)   . Adenocarcinoma of prostate 10/2005    lupron injections q85mo  . Atrial fibrillation     chronic anticoag  . Arthritis     ?rheumatoid, never dx or on DMARDs  . Osteoporosis     ROS:   All systems reviewed and negative except as noted in the HPI.   Past Surgical History  Procedure Date  . Pacemaker placement 09/2003  . Hernia repair     age 87 ( not sure if it was right or left side)  . Coronary artery bypass graft 08/1973    3 heart heart arteries were 80 percent clogged. the lower valve of my heart is not functioning  . Doppler echocardiography 2004, 2008     No family history on file.   History   Social History  . Marital Status: Married    Spouse Name: N/A    Number of Children: N/A  . Years of Education: N/A   Occupational History  . Not on file.   Social History Main Topics  . Smoking status: Former Smoker  Quit date: 08/05/1986  . Smokeless tobacco: Not on file  . Alcohol Use: Yes     Comment: samll glass of wine  . Drug Use: Not on file  . Sexually Active: Not on file   Other Topics Concern  . Not on file   Social History Narrative   Married  - lives with spouse and supportive daugherFormer Smoker -  quit tobacco 25 years ago  Alcohol use-yes (small glass of wine)           BP 119/65  Pulse 68  Wt 184 lb (83.462 kg)  Physical Exam:  Elderly appearing NAD HEENT: Unremarkable Neck:  No JVD, no thyromegally Lungs:  Clear with no wheezes, rales, or rhonchi. HEART:  Regular rate rhythm, no murmurs, no rubs, no clicks Abd:  soft, positive bowel sounds, no organomegally, no rebound, no guarding Ext:  2 plus pulses, no edema, no cyanosis, no clubbing Skin:  No rashes no nodules Neuro:  CN II through XII intact, motor grossly intact  DEVICE  Normal device function.  See PaceArt for details. He is approaching elective replacement   Assess/Plan:

## 2012-11-11 NOTE — Assessment & Plan Note (Signed)
:   His permanent pacemaker is working normally. We'll plan to recheck in several months. He is approaching elective replacement.

## 2012-11-11 NOTE — Assessment & Plan Note (Signed)
He is maintaining sinus rhythm. He'll continue his current medical therapy.

## 2012-11-11 NOTE — Assessment & Plan Note (Signed)
Change PPI to H2B due to change in bowels

## 2012-11-11 NOTE — Assessment & Plan Note (Signed)
Chronic anticoag - rate controlled and asymptomatic  Check labs given change in bowels including INR

## 2012-11-11 NOTE — Progress Notes (Signed)
Subjective:    Patient ID: [name], male    DOB: 10/04/1917, 79 y.o.   MRN: [id number]  Constipation This is a new problem. The current episode started 1 to 4 weeks ago. The problem has been waxing and waning since onset. His stool frequency is 1 time per day. The stool is described as loose. The patient is not on a high fiber diet. He does not exercise regularly. There has been adequate water intake. Associated symptoms include diarrhea. Pertinent negatives include no abdominal pain, anorexia, back pain, bloating, fecal incontinence, fever, hematochezia, melena, nausea, rectal pain, vomiting or weight loss. Risk factors include obesity. He has tried laxatives for the symptoms. The treatment provided no relief. There is no history of abdominal surgery, endocrine disease, inflammatory bowel disease, irritable bowel syndrome, psychiatric history or radiation treatment.    Past Medical History  Diagnosis Date  . Paroxysmal atrial fibrillation   . CAD (coronary artery disease)     CABG 08/1973  . CHF (congestive heart failure)     ICM  . Pacemaker 09/2003  . Hypertension   . Hyperlipidemia   . Unsteady gait     due to B ankle DJD and instability  . Memory loss   . Chronic renal insufficiency   . Barrett's esophagus   . Diverticulosis of colon   . GERD (gastroesophageal reflux disease)   . Adenocarcinoma of prostate 10/2005    lupron injections q85mo  . Atrial fibrillation     chronic anticoag  . Arthritis     ?rheumatoid, never dx or on DMARDs  . Osteoporosis      Review of Systems  Constitutional: Negative for fever and weight loss.  Gastrointestinal: Positive for diarrhea and constipation. Negative for nausea, vomiting, abdominal pain, melena, hematochezia, rectal pain, bloating and anorexia.  Musculoskeletal: Negative for back pain.       Objective:   Physical Exam BP 100/60  Pulse 71  Temp 97.6 F (36.4 C) (Oral)  Ht 5\' 3"  (1.6 m)  Wt 186 lb 6.4 oz (84.55 kg)   BMI 33.02 kg/m2  SpO2 94% Wt Readings from Last 3 Encounters:  11/11/12 186 lb 6.4 oz (84.55 kg)  08/05/12 184 lb (83.462 kg)  06/15/12 186 lb (84.369 kg)   Constitutional:  He appears well-developed and well-nourished. No distress. Dtr at side Neck: Normal range of motion. Neck supple. No JVD present. No thyromegaly present.  Cardiovascular: Normal rate, irregular rhythm and normal heart sounds.  No murmur heard. no BLE edema Pulmonary/Chest: Effort normal and breath sounds normal. No respiratory distress. no wheezes.  Abdominal: Soft. Bowel sounds are normal. Patient exhibits no distension. There is no tenderness.  Musculoskeletal: Normal range of motion. Patient exhibits no edema.  Neurological: he is alert and oriented to person, place, and time. No cranial nerve deficit. Coordination normal.  Skin: Skin is warm and dry.  No erythema or ulceration.  Psychiatric: he has a normal mood and affect. behavior is normal. Judgment and thought content normal.   Lab Results  Component Value Date   WBC 7.7 03/04/2010   HGB 13.0 03/04/2010   HCT 39.4 03/04/2010   PLT 230 03/04/2010   GLUCOSE 115* 09/12/2011   CHOL 218* 09/12/2011   TRIG 252.0* 09/12/2011   HDL 42.50 09/12/2011   LDLDIRECT 131.6 09/12/2011   LDLCALC 107* 03/04/2010   ALT 27 09/12/2011   AST 40* 09/12/2011   NA 140 09/12/2011   K 3.9 09/12/2011   CL 104 09/12/2011  CREATININE 1.4 09/12/2011   BUN 25* 09/12/2011   CO2 27 09/12/2011   TSH 4.027 03/04/2010   INR 2.4 10/05/2012        Assessment & Plan:   Change in bowels x 2 weeks Colo 07/2001 reviewed: diverticulosis and hemorrhoids  No clinical symptoms of infection, exam and hx largely benign Will change PPI to H2B Check labs: cbc,cmet and tsh Pt to call if symptoms worse or unimproved  Advised immodium or miralx prn

## 2012-11-16 ENCOUNTER — Ambulatory Visit (INDEPENDENT_AMBULATORY_CARE_PROVIDER_SITE_OTHER): Payer: Medicare Other | Admitting: General Practice

## 2012-11-16 DIAGNOSIS — I4891 Unspecified atrial fibrillation: Secondary | ICD-10-CM | POA: Diagnosis not present

## 2012-12-10 DIAGNOSIS — H6120 Impacted cerumen, unspecified ear: Secondary | ICD-10-CM | POA: Diagnosis not present

## 2012-12-13 ENCOUNTER — Encounter: Payer: Medicare Other | Admitting: *Deleted

## 2012-12-17 ENCOUNTER — Encounter: Payer: Self-pay | Admitting: *Deleted

## 2012-12-20 ENCOUNTER — Encounter: Payer: Self-pay | Admitting: Internal Medicine

## 2012-12-20 ENCOUNTER — Ambulatory Visit (INDEPENDENT_AMBULATORY_CARE_PROVIDER_SITE_OTHER): Payer: Medicare Other | Admitting: Internal Medicine

## 2012-12-20 ENCOUNTER — Telehealth: Payer: Self-pay | Admitting: Internal Medicine

## 2012-12-20 ENCOUNTER — Ambulatory Visit (INDEPENDENT_AMBULATORY_CARE_PROVIDER_SITE_OTHER)
Admission: RE | Admit: 2012-12-20 | Discharge: 2012-12-20 | Disposition: A | Discharge status: Home / Self Care | Payer: Medicare Other | Source: Ambulatory Visit | Attending: Internal Medicine | Admitting: Internal Medicine

## 2012-12-20 VITALS — BP 100/60 | HR 74 | Temp 98.3°F | Resp 16 | Wt 182.2 lb

## 2012-12-20 DIAGNOSIS — R05 Cough: Secondary | ICD-10-CM

## 2012-12-20 DIAGNOSIS — J209 Acute bronchitis, unspecified: Secondary | ICD-10-CM

## 2012-12-20 DIAGNOSIS — R059 Cough, unspecified: Secondary | ICD-10-CM

## 2012-12-20 MED ORDER — AZITHROMYCIN 500 MG PO TABS: 500.0000 mg | ORAL_TABLET | Freq: Every day | ORAL | Status: DC

## 2012-12-20 MED ORDER — PROMETHAZINE-DM 6.25-15 MG/5ML PO SYRP: 5.0000 mL | ORAL_SOLUTION | Freq: Four times a day (QID) | ORAL | Status: DC | PRN

## 2012-12-20 NOTE — Telephone Encounter (Signed)
Patient Information:  Caller Name: Verlon Au  Phone: [phone]  Patient: [name], [name]  Gender: Male  DOB: 1917-04-05  Age: 79 Years  PCP: Rene Paci (Adults only)  Office Follow Up:  Does the office need to follow up with this patient?: No  Instructions For The Office: N/A   Symptoms  Reason For Call & Symptoms: Daughter calling about a congested cough/mucous yellow.   Has hoarseness, asking for an appt.  Reviewed Health History In EMR: N/A  Reviewed Medications In EMR: N/A  Reviewed Allergies In EMR: N/A  Reviewed Surgeries / Procedures: N/A  Date of Onset of Symptoms: 12/17/2012  Treatments Tried: Cold Ez  Treatments Tried Worked: No  Guideline(s) Used:  Cough  Disposition Per Guideline:   See Today or Tomorrow in Office  Reason For Disposition Reached:   Patient wants to be seen  Advice Given:  N/A  Appointment Scheduled:  12/20/2012 10:15:00 Appointment Scheduled Provider:  Sanda Linger (Adults only)

## 2012-12-20 NOTE — Assessment & Plan Note (Signed)
I will check his CXR to see if he has PNA 

## 2012-12-20 NOTE — Progress Notes (Signed)
  Subjective:    Patient ID: [name], male    DOB: 1917/04/17, 79 y.o.   MRN: [id number]  Cough This is a new problem. The current episode started in the past 7 days. The problem has been gradually worsening. The problem occurs every few hours. The cough is productive of purulent sputum. Associated symptoms include chills, a fever and rhinorrhea. Pertinent negatives include no chest pain, ear congestion, ear pain, headaches, heartburn, hemoptysis, myalgias, nasal congestion, postnasal drip, rash, sore throat, shortness of breath, sweats, weight loss or wheezing. Nothing aggravates the symptoms. He has tried nothing for the symptoms.      Review of Systems  Constitutional: Positive for fever, chills and fatigue. Negative for weight loss, diaphoresis, activity change, appetite change and unexpected weight change.  HENT: Positive for rhinorrhea. Negative for ear pain, sore throat, facial swelling, trouble swallowing, neck stiffness, voice change and postnasal drip.   Eyes: Negative.   Respiratory: Positive for cough. Negative for hemoptysis, shortness of breath and wheezing.   Cardiovascular: Negative for chest pain, palpitations and leg swelling.  Gastrointestinal: Negative.  Negative for heartburn, nausea, abdominal pain and diarrhea.  Endocrine: Negative.   Genitourinary: Negative.   Musculoskeletal: Negative.  Negative for myalgias.  Skin: Negative.  Negative for rash.  Neurological: Negative for tremors, weakness, light-headedness and headaches.  Hematological: Negative for adenopathy. Does not bruise/bleed easily.  Psychiatric/Behavioral: Negative.        Objective:   Physical Exam  Vitals reviewed. Constitutional: He is oriented to person, place, and time. He appears well-developed and well-nourished.  Non-toxic appearance. He does not have a sickly appearance. He does not appear ill. No distress.  HENT:  Head: Normocephalic and atraumatic.  Mouth/Throat: Oropharynx is  clear and moist. No oropharyngeal exudate.  Eyes: Conjunctivae are normal. Right eye exhibits no discharge. Left eye exhibits no discharge. No scleral icterus.  Neck: Normal range of motion. Neck supple. No JVD present. No tracheal deviation present. No thyromegaly present.  Cardiovascular: Normal rate, regular rhythm, S1 normal, S2 normal and intact distal pulses.  Exam reveals no gallop, no S3, no S4 and no friction rub.   Murmur heard.  Decrescendo systolic murmur is present with a grade of 1/6   No diastolic murmur is present  Pulmonary/Chest: Effort normal and breath sounds normal. No stridor. No respiratory distress. He has no wheezes. He has no rales. He exhibits no tenderness.  Abdominal: Soft. Bowel sounds are normal. He exhibits no distension and no mass. There is no tenderness. There is no rebound and no guarding.  Musculoskeletal: Normal range of motion. He exhibits no edema and no tenderness.  Lymphadenopathy:    He has no cervical adenopathy.  Neurological: He is oriented to person, place, and time.  Skin: Skin is warm and dry. No rash noted. He is not diaphoretic. No erythema. No pallor.  Psychiatric: He has a normal mood and affect. His behavior is normal. Judgment and thought content normal.          Assessment & Plan:

## 2012-12-20 NOTE — Assessment & Plan Note (Signed)
Start zpak for the infection and a cough suppressant 

## 2012-12-20 NOTE — Patient Instructions (Signed)

## 2012-12-28 ENCOUNTER — Ambulatory Visit (INDEPENDENT_AMBULATORY_CARE_PROVIDER_SITE_OTHER): Payer: Medicare Other | Admitting: General Practice

## 2012-12-28 ENCOUNTER — Ambulatory Visit (INDEPENDENT_AMBULATORY_CARE_PROVIDER_SITE_OTHER)
Admission: RE | Admit: 2012-12-28 | Discharge: 2012-12-28 | Disposition: A | Discharge status: Home / Self Care | Payer: Medicare Other | Source: Ambulatory Visit | Attending: Internal Medicine | Admitting: Internal Medicine

## 2012-12-28 ENCOUNTER — Ambulatory Visit (INDEPENDENT_AMBULATORY_CARE_PROVIDER_SITE_OTHER): Payer: Medicare Other | Admitting: Internal Medicine

## 2012-12-28 ENCOUNTER — Encounter: Payer: Self-pay | Admitting: Internal Medicine

## 2012-12-28 VITALS — BP 100/54 | HR 67 | Temp 97.6°F

## 2012-12-28 DIAGNOSIS — R05 Cough: Secondary | ICD-10-CM

## 2012-12-28 DIAGNOSIS — I5042 Chronic combined systolic (congestive) and diastolic (congestive) heart failure: Secondary | ICD-10-CM

## 2012-12-28 DIAGNOSIS — I4891 Unspecified atrial fibrillation: Secondary | ICD-10-CM

## 2012-12-28 DIAGNOSIS — R5381 Other malaise: Secondary | ICD-10-CM | POA: Diagnosis not present

## 2012-12-28 DIAGNOSIS — R5383 Other fatigue: Secondary | ICD-10-CM | POA: Diagnosis not present

## 2012-12-28 DIAGNOSIS — I509 Heart failure, unspecified: Secondary | ICD-10-CM

## 2012-12-28 DIAGNOSIS — J449 Chronic obstructive pulmonary disease, unspecified: Secondary | ICD-10-CM | POA: Diagnosis not present

## 2012-12-28 MED ORDER — GUAIFENESIN-CODEINE 100-10 MG/5ML PO SYRP: 5.0000 mL | ORAL_SOLUTION | Freq: Three times a day (TID) | ORAL | Status: DC | PRN

## 2012-12-28 NOTE — Patient Instructions (Signed)
It was good to see you today. We have reviewed your prior records including labs and tests today Test(s) ordered today- recheck Chest xray today. Your results will be released to MyChart (or called to you) after review, usually within 72hours after test completion. If any changes need to be made, you will be notified at that same time. Stop promethazine cough syrup - will use Robitussin with codeine instead - Your prescription(s) have been submitted to your pharmacy. Please take as directed and contact our office if you believe you are having problem(s) with the medication(s). continue coumadin as per directions

## 2012-12-29 ENCOUNTER — Encounter: Payer: Self-pay | Admitting: Internal Medicine

## 2012-12-29 NOTE — Progress Notes (Signed)
Subjective:    Patient ID: [name], male    DOB: 1917/04/05, 79 y.o.   MRN: [id number]  HPI  Continued cough Seen last week for same - CXR clear, tx empirically for bronchitis with Zpak and prometh DM Dtr reports pt with excess sedation, fatigue and continued chest congestion No fever, no sputum No weight changes, edema or med changes Concerned for ?pneumonia given age  Past Medical History  Diagnosis Date  . Paroxysmal atrial fibrillation   . CAD (coronary artery disease)     CABG 08/1973  . CHF (congestive heart failure)     ICM  . Pacemaker 09/2003  . Hypertension   . Hyperlipidemia   . Unsteady gait     due to B ankle DJD and instability  . Memory loss   . Chronic renal insufficiency   . Barrett's esophagus   . Diverticulosis of colon   . GERD (gastroesophageal reflux disease)   . Adenocarcinoma of prostate 10/2005    lupron injections q37mo  . Atrial fibrillation     chronic anticoag  . Arthritis     ?rheumatoid, never dx or on DMARDs  . Osteoporosis     Review of Systems  Constitutional: Positive for fatigue. Negative for fever and unexpected weight change.  HENT: Negative for ear pain, congestion, rhinorrhea, sneezing, postnasal drip and sinus pressure.   Respiratory: Positive for cough. Negative for choking, chest tightness, wheezing and stridor.   Cardiovascular: Negative for chest pain, palpitations and leg swelling.  Neurological: Positive for weakness. Negative for dizziness, numbness and headaches.       Objective:   Physical Exam BP 100/54  Pulse 67  Temp(Src) 97.6 F (36.4 C) (Oral)  SpO2 97% Wt Readings from Last 3 Encounters:  12/20/12 182 lb 4 oz (82.668 kg)  11/11/12 184 lb (83.462 kg)  11/11/12 186 lb 6.4 oz (84.55 kg)   Constitutional:  He appears fatigued but well-developed and well-nourished. No acute distress. Dtr at side Neck: Normal range of motion. Neck supple. No JVD present. No thyromegaly present.  Cardiovascular:  Normal rate, regular rhythm and normal heart sounds.  No murmur heard. chronic mild BLE edema Pulmonary/Chest: Effort normal and breath sounds normal. No respiratory distress. no wheezes or crackles. Occ congested cough with inspiratory effort  Neurological: he is tired, but alert and oriented to person, place, and time. No cranial nerve deficit. Coordination normal.  Skin: Skin is warm and dry.  No erythema or ulceration.  Psychiatric: he has a normal mood and affect. behavior is normal. Judgment and thought content normal.   Lab Results  Component Value Date   WBC 7.9 11/11/2012   HGB 13.7 11/11/2012   HCT 40.8 11/11/2012   PLT 224.0 11/11/2012   GLUCOSE 78 11/11/2012   CHOL 218* 09/12/2011   TRIG 252.0* 09/12/2011   HDL 42.50 09/12/2011   LDLDIRECT 131.6 09/12/2011   LDLCALC 107* 03/04/2010   ALT 27 09/12/2011   AST 40* 09/12/2011   NA 139 11/11/2012   K 4.1 11/11/2012   CL 104 11/11/2012   CREATININE 1.3 11/11/2012   BUN 30* 11/11/2012   CO2 28 11/11/2012   TSH 3.95 11/11/2012   INR 4.3 12/28/2012       Assessment & Plan:   Cough, suspect bronchitis, post viral as nonproductive and no fever Malaise likely due to acute illness and med SE (promethazine q6h x 1 week) Hx CHF, appears euvolemic on exam today  Check CXR r/o PNA or CHF - hold  additional antibiotics tx unless infiltrate or other CXR change Change antitussive - dtr/pt report he tolerates codeine well, but advised on possible side effects (sedation and constipation)

## 2012-12-29 NOTE — Assessment & Plan Note (Signed)
Chronic anticoag - rate controlled and asymptomatic

## 2012-12-30 ENCOUNTER — Other Ambulatory Visit: Payer: Self-pay | Admitting: Internal Medicine

## 2012-12-30 ENCOUNTER — Ambulatory Visit (INDEPENDENT_AMBULATORY_CARE_PROVIDER_SITE_OTHER): Payer: Medicare Other | Admitting: *Deleted

## 2012-12-30 DIAGNOSIS — I4891 Unspecified atrial fibrillation: Secondary | ICD-10-CM

## 2012-12-30 DIAGNOSIS — Z950 Presence of cardiac pacemaker: Secondary | ICD-10-CM | POA: Diagnosis not present

## 2013-01-02 LAB — REMOTE PACEMAKER DEVICE
AL IMPEDENCE PM: 578 Ohm
AL THRESHOLD: 1 V
ATRIAL PACING PM: 38
BAMS-0001: 175 {beats}/min
BAMS-0002: 20 ms
RV LEAD AMPLITUDE: 22.4 mv

## 2013-01-04 ENCOUNTER — Other Ambulatory Visit: Payer: Self-pay | Admitting: Internal Medicine

## 2013-01-07 ENCOUNTER — Ambulatory Visit (INDEPENDENT_AMBULATORY_CARE_PROVIDER_SITE_OTHER): Payer: Medicare Other | Admitting: General Practice

## 2013-01-07 DIAGNOSIS — I4891 Unspecified atrial fibrillation: Secondary | ICD-10-CM

## 2013-01-07 LAB — POCT INR: INR: 4.1

## 2013-01-12 ENCOUNTER — Encounter: Payer: Self-pay | Admitting: *Deleted

## 2013-01-14 ENCOUNTER — Ambulatory Visit: Payer: Medicare Other | Admitting: Cardiology

## 2013-01-17 ENCOUNTER — Encounter: Payer: Self-pay | Admitting: Internal Medicine

## 2013-01-17 ENCOUNTER — Ambulatory Visit (INDEPENDENT_AMBULATORY_CARE_PROVIDER_SITE_OTHER): Payer: Medicare Other | Admitting: Internal Medicine

## 2013-01-17 VITALS — BP 102/70 | HR 76 | Temp 97.0°F | Wt 177.0 lb

## 2013-01-17 DIAGNOSIS — R269 Unspecified abnormalities of gait and mobility: Secondary | ICD-10-CM | POA: Diagnosis not present

## 2013-01-17 MED ORDER — ONDANSETRON HCL 4 MG PO TABS: 4.0000 mg | ORAL_TABLET | Freq: Three times a day (TID) | ORAL | Status: DC | PRN

## 2013-01-17 NOTE — Patient Instructions (Signed)
It was good to see you today. Use generic Zofran as needed for nausea and vomiting - Your prescription(s) have been submitted to your pharmacy. Please take as directed and contact our office if you believe you are having problem(s) with the medication(s). Ok to continue Imodium for diarrhea as needed B.R.A.T. Diet Your doctor has recommended the B.R.A.T. diet for you or your child until the condition improves. This is often used to help control diarrhea and vomiting symptoms. If you or your child can tolerate clear liquids, you may have:  Bananas.   Rice.   Applesauce.   Toast (and other simple starches such as crackers, potatoes, noodles).  Be sure to avoid dairy products, meats, and fatty foods until symptoms are better. Fruit juices such as apple, grape, and prune juice can make diarrhea worse. Avoid these. Continue this diet for 2 days or as instructed by your caregiver. Document Released: 10/27/2005 Document Revised: 10/16/2011 Document Reviewed: 04/15/2007 [facility] Patient Information 2012 [place], [place].Viral Gastroenteritis Viral gastroenteritis is also called stomach flu. This illness is caused by a certain type of germ (virus). It can cause sudden watery poop (diarrhea) and throwing up (vomiting). This can cause you to lose body fluids (dehydration). This illness usually lasts for 3 to 8 days. It usually goes away on its own. HOME CARE   Drink enough fluids to keep your pee (urine) clear or pale yellow. Drink small amounts of fluids often.  Ask your doctor how to replace body fluid losses (rehydration).  Avoid:  Foods high in sugar.  Alcohol.  Bubbly (carbonated) drinks.  Tobacco.  Juice.  Caffeine drinks.  Very hot or cold fluids.  Fatty, greasy foods.  Eating too much at one time.  Dairy products until 24 to 48 hours after your watery poop stops.  You may eat foods with active cultures (probiotics). They can be found in some yogurts and supplements.  Wash  your hands well to avoid spreading the illness.  Only take medicines as told by your doctor. Do not give aspirin to children. Do not take medicines for watery poop (antidiarrheals).  Ask your doctor if you should keep taking your regular medicines.  Keep all doctor visits as told. GET HELP RIGHT AWAY IF:   You cannot keep fluids down.  You do not pee at least once every 6 to 8 hours.  You are short of breath.  You see blood in your poop or throw up. This may look like coffee grounds.  You have belly (abdominal) pain that gets worse or is just in one small spot (localized).  You keep throwing up or having watery poop.  You have a fever.  The patient is a child younger than 3 months, and he or she has a fever.  The patient is a child older than 3 months, and he or she has a fever and problems that do not go away.  The patient is a child older than 3 months, and he or she has a fever and problems that suddenly get worse.  The patient is a baby, and he or she has no tears when crying. MAKE SURE YOU:   Understand these instructions.  Will watch your condition.  Will get help right away if you are not doing well or get worse. Document Released: 04/14/2008 Document Revised: 01/19/2012 Document Reviewed: 08/13/2011 [facility] Patient Information 2013 [place], [place].

## 2013-01-17 NOTE — Progress Notes (Signed)
Subjective:    Patient ID: [name], male    DOB: 14-Jun-1917, 79 y.o.   MRN: [id number]  Emesis  Associated symptoms include diarrhea. Pertinent negatives include no abdominal pain, chest pain, coughing, dizziness, fever or headaches.  Diarrhea  Associated symptoms include vomiting. Pertinent negatives include no abdominal pain, coughing, fever or headaches.   See CC - symptoms ongoing 3 days Not improved with hydration and rest associated with diarrhea (usual - controlled with OTC imodium)  Also requests order for home health physical therapy also DME for toilet bars/support (needs written prescription to submit to the [facility] for coverage of same)  Past Medical History  Diagnosis Date  . Paroxysmal atrial fibrillation   . CAD (coronary artery disease)     CABG 08/1973  . CHF (congestive heart failure)     ICM  . Pacemaker 09/2003  . Hypertension   . Hyperlipidemia   . Unsteady gait     due to B ankle DJD and instability  . Memory loss   . Chronic renal insufficiency   . Barrett's esophagus   . Diverticulosis of colon   . GERD (gastroesophageal reflux disease)   . Adenocarcinoma of prostate 10/2005    lupron injections q76mo  . Atrial fibrillation     chronic anticoag  . Arthritis     ?rheumatoid, never dx or on DMARDs  . Osteoporosis     Review of Systems  Constitutional: Positive for fatigue. Negative for fever, activity change and unexpected weight change.  Respiratory: Negative for cough and chest tightness.   Cardiovascular: Negative for chest pain, palpitations and leg swelling.  Gastrointestinal: Positive for vomiting and diarrhea. Negative for nausea, abdominal pain, constipation, blood in stool and rectal pain.  Neurological: Positive for weakness. Negative for dizziness, numbness and headaches.       Objective:   Physical Exam  BP 102/70  Pulse 76  Temp(Src) 97 F (36.1 C) (Oral)  Wt 177 lb (80.287 kg)  BMI 31.36 kg/m2  SpO2 97% Wt Readings  from Last 3 Encounters:  01/17/13 177 lb (80.287 kg)  12/20/12 182 lb 4 oz (82.668 kg)  11/11/12 184 lb (83.462 kg)   Constitutional:  He appears fatigued but well-developed and well-nourished. No acute distress. Dtr at side Neck: Normal range of motion. Neck supple. No JVD present. No thyromegaly present.  Cardiovascular: Normal rate, regular rhythm and normal heart sounds.  No murmur heard. chronic mild BLE edema Pulmonary/Chest: Effort normal and breath sounds normal. No respiratory distress. no wheezes or crackles. Abd: SNTND, +BS, no r/g -suprapubic fullness but nontender Neurological: he is tired, but alert and oriented to person, place, and time. No cranial nerve deficit. Coordination normal.  Skin: Skin is warm and dry.  No erythema or ulceration.  Psychiatric: he has a normal mood and affect. behavior is normal. Judgment and thought content normal.   Lab Results  Component Value Date   WBC 7.9 11/11/2012   HGB 13.7 11/11/2012   HCT 40.8 11/11/2012   PLT 224.0 11/11/2012   GLUCOSE 78 11/11/2012   CHOL 218* 09/12/2011   TRIG 252.0* 09/12/2011   HDL 42.50 09/12/2011   LDLDIRECT 131.6 09/12/2011   LDLCALC 107* 03/04/2010   ALT 27 09/12/2011   AST 40* 09/12/2011   NA 139 11/11/2012   K 4.1 11/11/2012   CL 104 11/11/2012   CREATININE 1.3 11/11/2012   BUN 30* 11/11/2012   CO2 28 11/11/2012   TSH 3.95 11/11/2012   INR 4.1 01/07/2013  Assessment & Plan:   Viral gastroenteritis - vomiting predominate, improving in past 24h - exam benign and HD stable - use zofran prn and BRAT diet - ok to continue imodium prn and call if symptoms worse or unimproved - reviewed red flags for which to call or seek additional care

## 2013-01-17 NOTE — Assessment & Plan Note (Signed)
Chronic issue with progressive failure to thrive and advancing age with repeat acute medical illness Health physical therapy and DME needs reviewed, orders placed today

## 2013-01-19 ENCOUNTER — Encounter: Payer: Self-pay | Admitting: Internal Medicine

## 2013-01-21 ENCOUNTER — Ambulatory Visit (INDEPENDENT_AMBULATORY_CARE_PROVIDER_SITE_OTHER): Payer: Medicare Other | Admitting: General Practice

## 2013-01-21 DIAGNOSIS — I4891 Unspecified atrial fibrillation: Secondary | ICD-10-CM | POA: Diagnosis not present

## 2013-01-31 ENCOUNTER — Encounter: Payer: Medicare Other | Admitting: *Deleted

## 2013-02-01 DIAGNOSIS — M19079 Primary osteoarthritis, unspecified ankle and foot: Secondary | ICD-10-CM | POA: Diagnosis not present

## 2013-02-01 DIAGNOSIS — R627 Adult failure to thrive: Secondary | ICD-10-CM | POA: Diagnosis not present

## 2013-02-01 DIAGNOSIS — R269 Unspecified abnormalities of gait and mobility: Secondary | ICD-10-CM | POA: Diagnosis not present

## 2013-02-01 DIAGNOSIS — N189 Chronic kidney disease, unspecified: Secondary | ICD-10-CM | POA: Diagnosis not present

## 2013-02-01 DIAGNOSIS — M810 Age-related osteoporosis without current pathological fracture: Secondary | ICD-10-CM | POA: Diagnosis not present

## 2013-02-01 DIAGNOSIS — IMO0001 Reserved for inherently not codable concepts without codable children: Secondary | ICD-10-CM | POA: Diagnosis not present

## 2013-02-02 DIAGNOSIS — IMO0001 Reserved for inherently not codable concepts without codable children: Secondary | ICD-10-CM | POA: Diagnosis not present

## 2013-02-02 DIAGNOSIS — M19079 Primary osteoarthritis, unspecified ankle and foot: Secondary | ICD-10-CM | POA: Diagnosis not present

## 2013-02-02 DIAGNOSIS — N189 Chronic kidney disease, unspecified: Secondary | ICD-10-CM | POA: Diagnosis not present

## 2013-02-02 DIAGNOSIS — M810 Age-related osteoporosis without current pathological fracture: Secondary | ICD-10-CM | POA: Diagnosis not present

## 2013-02-02 DIAGNOSIS — R269 Unspecified abnormalities of gait and mobility: Secondary | ICD-10-CM | POA: Diagnosis not present

## 2013-02-02 DIAGNOSIS — R627 Adult failure to thrive: Secondary | ICD-10-CM | POA: Diagnosis not present

## 2013-02-03 ENCOUNTER — Encounter: Payer: Self-pay | Admitting: Internal Medicine

## 2013-02-03 ENCOUNTER — Other Ambulatory Visit (INDEPENDENT_AMBULATORY_CARE_PROVIDER_SITE_OTHER): Payer: Medicare Other

## 2013-02-03 ENCOUNTER — Ambulatory Visit (INDEPENDENT_AMBULATORY_CARE_PROVIDER_SITE_OTHER): Payer: Medicare Other | Admitting: Internal Medicine

## 2013-02-03 VITALS — BP 100/52 | HR 82 | Temp 96.9°F | Wt 178.8 lb

## 2013-02-03 DIAGNOSIS — Z79899 Other long term (current) drug therapy: Secondary | ICD-10-CM

## 2013-02-03 DIAGNOSIS — L259 Unspecified contact dermatitis, unspecified cause: Secondary | ICD-10-CM | POA: Diagnosis not present

## 2013-02-03 DIAGNOSIS — I10 Essential (primary) hypertension: Secondary | ICD-10-CM

## 2013-02-03 DIAGNOSIS — E785 Hyperlipidemia, unspecified: Secondary | ICD-10-CM | POA: Diagnosis not present

## 2013-02-03 DIAGNOSIS — L309 Dermatitis, unspecified: Secondary | ICD-10-CM

## 2013-02-03 DIAGNOSIS — I4891 Unspecified atrial fibrillation: Secondary | ICD-10-CM

## 2013-02-03 LAB — HEPATIC FUNCTION PANEL
ALT: 23 U/L (ref 0–53)
AST: 24 U/L (ref 0–37)
Albumin: 4.1 g/dL (ref 3.5–5.2)
Alkaline Phosphatase: 49 U/L (ref 39–117)

## 2013-02-03 LAB — LIPID PANEL
Cholesterol: 192 mg/dL (ref 0–200)
Total CHOL/HDL Ratio: 5

## 2013-02-03 MED ORDER — TRIAMCINOLONE ACETONIDE 0.1 % EX OINT: TOPICAL_OINTMENT | Freq: Two times a day (BID) | CUTANEOUS | Status: DC | PRN

## 2013-02-03 NOTE — Assessment & Plan Note (Signed)
Chronic anticoag - rate controlled and asymptomatic

## 2013-02-03 NOTE — Assessment & Plan Note (Signed)
On statin, stable dose for years - 2ndary tx for CAD Check annual labs and adjust prn

## 2013-02-03 NOTE — Patient Instructions (Signed)
It was good to see you today. We have reviewed your prior records including labs and tests today Medications reviewed, use triamcinolone ointment to affected skin 2x/day - no other changes at this time. Your prescription(s) have been submitted to your pharmacy. Please take as directed and contact our office if you believe you are having problem(s) with the medication(s). Test(s) ordered today. Your results will be released to MyChart (or called to you) after review, usually within 72hours after test completion. If any changes need to be made, you will be notified at that same time. Please schedule followup in 6 months for blood pressure check and medication review, call sooner if problems.

## 2013-02-03 NOTE — Progress Notes (Signed)
  Subjective:    Patient ID: TIN ENGRAM, male    DOB: January 08, 1917, 79 y.o.   MRN: [id number]  HPI  Here for follow up - reviewed chronic medical issues  Past Medical History  Diagnosis Date  . Paroxysmal atrial fibrillation   . CAD (coronary artery disease)     CABG 08/1973  . CHF (congestive heart failure)     ICM  . Pacemaker 09/2003  . Hypertension   . Hyperlipidemia   . Unsteady gait     due to B ankle DJD and instability  . Memory loss   . Chronic renal insufficiency   . Barrett's esophagus   . Diverticulosis of colon   . GERD (gastroesophageal reflux disease)   . Adenocarcinoma of prostate 10/2005    lupron injections q102mo  . Atrial fibrillation     chronic anticoag  . Arthritis     ?rheumatoid, never dx or on DMARDs  . Osteoporosis      Review of Systems  Constitutional: Negative for fever and activity change.  Cardiovascular: Negative for leg swelling.  Musculoskeletal: Positive for arthralgias. Negative for myalgias, back pain and joint swelling.       Objective:   Physical Exam  BP 100/52  Pulse 82  Temp(Src) 96.9 F (36.1 C) (Oral)  Wt 178 lb 12.8 oz (81.103 kg)  BMI 31.68 kg/m2  SpO2 99% Wt Readings from Last 3 Encounters:  02/03/13 178 lb 12.8 oz (81.103 kg)  01/17/13 177 lb (80.287 kg)  12/20/12 182 lb 4 oz (82.668 kg)   Constitutional:  He is overweight, but appears well-developed and well-nourished. No distress. Uses rolling walker for support Cardiovascular: Normal rate, irregular rhythm and normal heart sounds.  No murmur heard. no BLE edema Pulmonary/Chest: Effort normal and breath sounds normal. No respiratory distress. no wheezes.  MSkel: arthritic changes both hands - B ankle brace supports (all unchanged from baseline) - no effusions or gross deformities Skin: 2 - 3cm round eczema-like patches on lower abdomen - symmetric on each LQ just below underwear band - no excoriation or ulceration Neurological: he is alert and oriented to  person, place, and time. No cranial nerve deficit. Coordination normal.  Psychiatric: he has a normal mood and affect. behavior is normal. Judgment and thought content normal.   Lab Results  Component Value Date   WBC 7.9 11/11/2012   HGB 13.7 11/11/2012   HCT 40.8 11/11/2012   PLT 224.0 11/11/2012   GLUCOSE 78 11/11/2012   CHOL 218* 09/12/2011   TRIG 252.0* 09/12/2011   HDL 42.50 09/12/2011   LDLDIRECT 131.6 09/12/2011   LDLCALC 107* 03/04/2010   ALT 27 09/12/2011   AST 40* 09/12/2011   NA 139 11/11/2012   K 4.1 11/11/2012   CL 104 11/11/2012   CREATININE 1.3 11/11/2012   BUN 30* 11/11/2012   CO2 28 11/11/2012   TSH 3.95 11/11/2012   INR 2.9 01/21/2013      Assessment & Plan:   See problem list. Medications and labs reviewed today.  Eczema - tx with triamcinolone bid - erx done

## 2013-02-03 NOTE — Assessment & Plan Note (Signed)
BP Readings from Last 3 Encounters:  02/03/13 100/52  01/17/13 102/70  12/28/12 100/54   The current medical regimen is effective;  continue present plan and medications.

## 2013-02-04 DIAGNOSIS — R627 Adult failure to thrive: Secondary | ICD-10-CM

## 2013-02-04 DIAGNOSIS — R269 Unspecified abnormalities of gait and mobility: Secondary | ICD-10-CM | POA: Diagnosis not present

## 2013-02-04 DIAGNOSIS — M19079 Primary osteoarthritis, unspecified ankle and foot: Secondary | ICD-10-CM | POA: Diagnosis not present

## 2013-02-04 DIAGNOSIS — IMO0001 Reserved for inherently not codable concepts without codable children: Secondary | ICD-10-CM | POA: Diagnosis not present

## 2013-02-07 DIAGNOSIS — N189 Chronic kidney disease, unspecified: Secondary | ICD-10-CM | POA: Diagnosis not present

## 2013-02-07 DIAGNOSIS — R269 Unspecified abnormalities of gait and mobility: Secondary | ICD-10-CM | POA: Diagnosis not present

## 2013-02-07 DIAGNOSIS — IMO0001 Reserved for inherently not codable concepts without codable children: Secondary | ICD-10-CM | POA: Diagnosis not present

## 2013-02-07 DIAGNOSIS — R627 Adult failure to thrive: Secondary | ICD-10-CM | POA: Diagnosis not present

## 2013-02-07 DIAGNOSIS — M810 Age-related osteoporosis without current pathological fracture: Secondary | ICD-10-CM | POA: Diagnosis not present

## 2013-02-07 DIAGNOSIS — M19079 Primary osteoarthritis, unspecified ankle and foot: Secondary | ICD-10-CM | POA: Diagnosis not present

## 2013-02-10 ENCOUNTER — Encounter: Payer: Self-pay | Admitting: *Deleted

## 2013-02-10 ENCOUNTER — Other Ambulatory Visit: Payer: Self-pay | Admitting: Internal Medicine

## 2013-02-11 ENCOUNTER — Ambulatory Visit (INDEPENDENT_AMBULATORY_CARE_PROVIDER_SITE_OTHER): Payer: Medicare Other | Admitting: General Practice

## 2013-02-11 DIAGNOSIS — N189 Chronic kidney disease, unspecified: Secondary | ICD-10-CM | POA: Diagnosis not present

## 2013-02-11 DIAGNOSIS — M19079 Primary osteoarthritis, unspecified ankle and foot: Secondary | ICD-10-CM | POA: Diagnosis not present

## 2013-02-11 DIAGNOSIS — R269 Unspecified abnormalities of gait and mobility: Secondary | ICD-10-CM | POA: Diagnosis not present

## 2013-02-11 DIAGNOSIS — I4891 Unspecified atrial fibrillation: Secondary | ICD-10-CM | POA: Diagnosis not present

## 2013-02-11 DIAGNOSIS — M810 Age-related osteoporosis without current pathological fracture: Secondary | ICD-10-CM | POA: Diagnosis not present

## 2013-02-11 DIAGNOSIS — R627 Adult failure to thrive: Secondary | ICD-10-CM | POA: Diagnosis not present

## 2013-02-11 DIAGNOSIS — IMO0001 Reserved for inherently not codable concepts without codable children: Secondary | ICD-10-CM | POA: Diagnosis not present

## 2013-02-15 DIAGNOSIS — M19079 Primary osteoarthritis, unspecified ankle and foot: Secondary | ICD-10-CM | POA: Diagnosis not present

## 2013-02-15 DIAGNOSIS — N189 Chronic kidney disease, unspecified: Secondary | ICD-10-CM | POA: Diagnosis not present

## 2013-02-15 DIAGNOSIS — IMO0001 Reserved for inherently not codable concepts without codable children: Secondary | ICD-10-CM | POA: Diagnosis not present

## 2013-02-15 DIAGNOSIS — R627 Adult failure to thrive: Secondary | ICD-10-CM | POA: Diagnosis not present

## 2013-02-15 DIAGNOSIS — R269 Unspecified abnormalities of gait and mobility: Secondary | ICD-10-CM | POA: Diagnosis not present

## 2013-02-15 DIAGNOSIS — M810 Age-related osteoporosis without current pathological fracture: Secondary | ICD-10-CM | POA: Diagnosis not present

## 2013-02-16 ENCOUNTER — Other Ambulatory Visit: Payer: Self-pay | Admitting: Internal Medicine

## 2013-02-16 ENCOUNTER — Ambulatory Visit (INDEPENDENT_AMBULATORY_CARE_PROVIDER_SITE_OTHER): Payer: Medicare Other | Admitting: *Deleted

## 2013-02-16 DIAGNOSIS — I4891 Unspecified atrial fibrillation: Secondary | ICD-10-CM

## 2013-02-16 DIAGNOSIS — Z950 Presence of cardiac pacemaker: Secondary | ICD-10-CM | POA: Diagnosis not present

## 2013-02-18 DIAGNOSIS — R269 Unspecified abnormalities of gait and mobility: Secondary | ICD-10-CM | POA: Diagnosis not present

## 2013-02-18 DIAGNOSIS — R627 Adult failure to thrive: Secondary | ICD-10-CM | POA: Diagnosis not present

## 2013-02-18 DIAGNOSIS — M19079 Primary osteoarthritis, unspecified ankle and foot: Secondary | ICD-10-CM | POA: Diagnosis not present

## 2013-02-18 DIAGNOSIS — IMO0001 Reserved for inherently not codable concepts without codable children: Secondary | ICD-10-CM | POA: Diagnosis not present

## 2013-02-18 DIAGNOSIS — M810 Age-related osteoporosis without current pathological fracture: Secondary | ICD-10-CM | POA: Diagnosis not present

## 2013-02-18 DIAGNOSIS — N189 Chronic kidney disease, unspecified: Secondary | ICD-10-CM | POA: Diagnosis not present

## 2013-02-22 LAB — REMOTE PACEMAKER DEVICE
AL IMPEDENCE PM: 588 Ohm
AL THRESHOLD: 0.875 V
ATRIAL PACING PM: 41
BAMS-0002: 20 ms
RV LEAD THRESHOLD: 1 V

## 2013-02-23 DIAGNOSIS — IMO0001 Reserved for inherently not codable concepts without codable children: Secondary | ICD-10-CM | POA: Diagnosis not present

## 2013-02-23 DIAGNOSIS — M19079 Primary osteoarthritis, unspecified ankle and foot: Secondary | ICD-10-CM | POA: Diagnosis not present

## 2013-02-23 DIAGNOSIS — M810 Age-related osteoporosis without current pathological fracture: Secondary | ICD-10-CM | POA: Diagnosis not present

## 2013-02-23 DIAGNOSIS — N189 Chronic kidney disease, unspecified: Secondary | ICD-10-CM | POA: Diagnosis not present

## 2013-02-23 DIAGNOSIS — R627 Adult failure to thrive: Secondary | ICD-10-CM | POA: Diagnosis not present

## 2013-02-23 DIAGNOSIS — R269 Unspecified abnormalities of gait and mobility: Secondary | ICD-10-CM | POA: Diagnosis not present

## 2013-02-25 DIAGNOSIS — R627 Adult failure to thrive: Secondary | ICD-10-CM | POA: Diagnosis not present

## 2013-02-25 DIAGNOSIS — IMO0001 Reserved for inherently not codable concepts without codable children: Secondary | ICD-10-CM | POA: Diagnosis not present

## 2013-02-25 DIAGNOSIS — N189 Chronic kidney disease, unspecified: Secondary | ICD-10-CM | POA: Diagnosis not present

## 2013-02-25 DIAGNOSIS — M810 Age-related osteoporosis without current pathological fracture: Secondary | ICD-10-CM | POA: Diagnosis not present

## 2013-02-25 DIAGNOSIS — R269 Unspecified abnormalities of gait and mobility: Secondary | ICD-10-CM | POA: Diagnosis not present

## 2013-02-25 DIAGNOSIS — M19079 Primary osteoarthritis, unspecified ankle and foot: Secondary | ICD-10-CM | POA: Diagnosis not present

## 2013-02-28 ENCOUNTER — Other Ambulatory Visit: Payer: Self-pay | Admitting: Internal Medicine

## 2013-02-28 DIAGNOSIS — R269 Unspecified abnormalities of gait and mobility: Secondary | ICD-10-CM | POA: Diagnosis not present

## 2013-02-28 DIAGNOSIS — R627 Adult failure to thrive: Secondary | ICD-10-CM | POA: Diagnosis not present

## 2013-02-28 DIAGNOSIS — IMO0001 Reserved for inherently not codable concepts without codable children: Secondary | ICD-10-CM | POA: Diagnosis not present

## 2013-02-28 DIAGNOSIS — M810 Age-related osteoporosis without current pathological fracture: Secondary | ICD-10-CM | POA: Diagnosis not present

## 2013-02-28 DIAGNOSIS — N189 Chronic kidney disease, unspecified: Secondary | ICD-10-CM | POA: Diagnosis not present

## 2013-02-28 DIAGNOSIS — M19079 Primary osteoarthritis, unspecified ankle and foot: Secondary | ICD-10-CM | POA: Diagnosis not present

## 2013-03-02 DIAGNOSIS — R269 Unspecified abnormalities of gait and mobility: Secondary | ICD-10-CM | POA: Diagnosis not present

## 2013-03-02 DIAGNOSIS — M19079 Primary osteoarthritis, unspecified ankle and foot: Secondary | ICD-10-CM | POA: Diagnosis not present

## 2013-03-02 DIAGNOSIS — IMO0001 Reserved for inherently not codable concepts without codable children: Secondary | ICD-10-CM | POA: Diagnosis not present

## 2013-03-02 DIAGNOSIS — N189 Chronic kidney disease, unspecified: Secondary | ICD-10-CM | POA: Diagnosis not present

## 2013-03-02 DIAGNOSIS — M810 Age-related osteoporosis without current pathological fracture: Secondary | ICD-10-CM | POA: Diagnosis not present

## 2013-03-02 DIAGNOSIS — R627 Adult failure to thrive: Secondary | ICD-10-CM | POA: Diagnosis not present

## 2013-03-08 ENCOUNTER — Encounter: Payer: Self-pay | Admitting: *Deleted

## 2013-03-08 DIAGNOSIS — M19079 Primary osteoarthritis, unspecified ankle and foot: Secondary | ICD-10-CM | POA: Diagnosis not present

## 2013-03-08 DIAGNOSIS — IMO0001 Reserved for inherently not codable concepts without codable children: Secondary | ICD-10-CM | POA: Diagnosis not present

## 2013-03-08 DIAGNOSIS — N189 Chronic kidney disease, unspecified: Secondary | ICD-10-CM | POA: Diagnosis not present

## 2013-03-08 DIAGNOSIS — R627 Adult failure to thrive: Secondary | ICD-10-CM | POA: Diagnosis not present

## 2013-03-08 DIAGNOSIS — M810 Age-related osteoporosis without current pathological fracture: Secondary | ICD-10-CM | POA: Diagnosis not present

## 2013-03-08 DIAGNOSIS — R269 Unspecified abnormalities of gait and mobility: Secondary | ICD-10-CM | POA: Diagnosis not present

## 2013-03-11 ENCOUNTER — Ambulatory Visit (INDEPENDENT_AMBULATORY_CARE_PROVIDER_SITE_OTHER): Payer: Medicare Other | Admitting: General Practice

## 2013-03-11 ENCOUNTER — Encounter: Payer: Self-pay | Admitting: Internal Medicine

## 2013-03-11 DIAGNOSIS — I4891 Unspecified atrial fibrillation: Secondary | ICD-10-CM

## 2013-03-11 DIAGNOSIS — N189 Chronic kidney disease, unspecified: Secondary | ICD-10-CM | POA: Diagnosis not present

## 2013-03-11 DIAGNOSIS — IMO0001 Reserved for inherently not codable concepts without codable children: Secondary | ICD-10-CM | POA: Diagnosis not present

## 2013-03-11 DIAGNOSIS — R627 Adult failure to thrive: Secondary | ICD-10-CM | POA: Diagnosis not present

## 2013-03-11 DIAGNOSIS — M810 Age-related osteoporosis without current pathological fracture: Secondary | ICD-10-CM | POA: Diagnosis not present

## 2013-03-11 DIAGNOSIS — M19079 Primary osteoarthritis, unspecified ankle and foot: Secondary | ICD-10-CM | POA: Diagnosis not present

## 2013-03-11 DIAGNOSIS — R269 Unspecified abnormalities of gait and mobility: Secondary | ICD-10-CM | POA: Diagnosis not present

## 2013-03-11 LAB — POCT INR: INR: 3

## 2013-03-21 ENCOUNTER — Encounter: Payer: Medicare Other | Admitting: *Deleted

## 2013-03-29 ENCOUNTER — Ambulatory Visit (INDEPENDENT_AMBULATORY_CARE_PROVIDER_SITE_OTHER): Payer: Medicare Other | Admitting: General Practice

## 2013-03-29 DIAGNOSIS — I4891 Unspecified atrial fibrillation: Secondary | ICD-10-CM

## 2013-03-29 LAB — POCT INR: INR: 2.6

## 2013-04-01 ENCOUNTER — Encounter: Payer: Self-pay | Admitting: *Deleted

## 2013-04-14 ENCOUNTER — Ambulatory Visit (INDEPENDENT_AMBULATORY_CARE_PROVIDER_SITE_OTHER): Payer: Medicare Other | Admitting: Cardiology

## 2013-04-14 ENCOUNTER — Encounter: Payer: Self-pay | Admitting: Cardiology

## 2013-04-14 ENCOUNTER — Ambulatory Visit (INDEPENDENT_AMBULATORY_CARE_PROVIDER_SITE_OTHER): Payer: Medicare Other | Admitting: *Deleted

## 2013-04-14 VITALS — BP 108/60 | HR 66 | Ht 63.0 in | Wt 180.0 lb

## 2013-04-14 DIAGNOSIS — I252 Old myocardial infarction: Secondary | ICD-10-CM

## 2013-04-14 DIAGNOSIS — Z950 Presence of cardiac pacemaker: Secondary | ICD-10-CM

## 2013-04-14 DIAGNOSIS — I4891 Unspecified atrial fibrillation: Secondary | ICD-10-CM | POA: Diagnosis not present

## 2013-04-14 DIAGNOSIS — I059 Rheumatic mitral valve disease, unspecified: Secondary | ICD-10-CM | POA: Diagnosis not present

## 2013-04-14 DIAGNOSIS — I5042 Chronic combined systolic (congestive) and diastolic (congestive) heart failure: Secondary | ICD-10-CM | POA: Diagnosis not present

## 2013-04-14 DIAGNOSIS — I509 Heart failure, unspecified: Secondary | ICD-10-CM

## 2013-04-14 NOTE — Progress Notes (Signed)
HPI Mr. Tagliaferro returns today for evaluation and management his complex cardiovascular history. Please refer to the problem list and past medical history.  At 81, he continues to do well. He gets around with his walker and has  not fallen. He denies any chest pain or angina. He said orthopnea, PND or increased edema. He wears support hose and leg braces. His pacemaker battery may be due to a change out which were checking with the pacemaker clinic today.  Past Medical History  Diagnosis Date  . Paroxysmal atrial fibrillation   . CAD (coronary artery disease)     CABG 08/1973  . CHF (congestive heart failure)     ICM  . Pacemaker 09/2003  . Hypertension   . Hyperlipidemia   . Unsteady gait     due to B ankle DJD and instability  . Memory loss   . Chronic renal insufficiency   . Barrett's esophagus   . Diverticulosis of colon   . GERD (gastroesophageal reflux disease)   . Adenocarcinoma of prostate 10/2005    lupron injections q66mo  . Atrial fibrillation     chronic anticoag  . Arthritis     ?rheumatoid, never dx or on DMARDs  . Osteoporosis     Current Outpatient Prescriptions  Medication Sig Dispense Refill  . alendronate (FOSAMAX) 70 MG tablet Take 70 mg by mouth every 7 (seven) days. Take with a full glass of water on an empty stomach.       Marland Kitchen aspirin 81 MG tablet Take 81 mg by mouth daily.      . benazepril (LOTENSIN) 5 MG tablet TAKE 1/2 TABLET BY MOUTH ONCE A DAY  45 tablet  1  . furosemide (LASIX) 20 MG tablet Take 20 mg by mouth daily.       . metoprolol tartrate (LOPRESSOR) 25 MG tablet TAKE 1 TABLET BY MOUTH TWICE DAILY  60 tablet  5  . nitroGLYCERIN (NITROSTAT) 0.4 MG SL tablet Place 0.4 mg under the tongue every 5 (five) minutes as needed.        . ondansetron (ZOFRAN) 4 MG tablet Take 1 tablet (4 mg total) by mouth every 8 (eight) hours as needed for nausea (vomiting).  30 tablet  0  . potassium chloride (K-DUR,KLOR-CON) 10 MEQ tablet TAKE 1 TABLET BY MOUTH DAILY  30  tablet  5  . pravastatin (PRAVACHOL) 10 MG tablet TAKE ONE TABLET BY MOUTH EVERY EVENING  90 tablet  1  . ranitidine (ZANTAC) 150 MG tablet Take 1 tablet (150 mg total) by mouth at bedtime.  30 tablet  5  . warfarin (COUMADIN) 1 MG tablet Take 1 tablet (1 mg total) by mouth as directed. Take as directed by coumadin clinic.  45 tablet  3  . [DISCONTINUED] potassium chloride (KLOR-CON) 10 MEQ CR tablet Take 1 tablet (10 mEq total) by mouth daily.  30 tablet  3   No current facility-administered medications for this visit.    No Known Allergies  No family history on file.  History   Social History  . Marital Status: Married    Spouse Name: N/A    Number of Children: N/A  . Years of Education: N/A   Occupational History  . Not on file.   Social History Main Topics  . Smoking status: Former Smoker    Quit date: 08/05/1986  . Smokeless tobacco: Not on file  . Alcohol Use: Yes     Comment: samll glass of wine  . Drug Use:  Not on file  . Sexually Active: Not on file   Other Topics Concern  . Not on file   Social History Narrative   Married  - lives with spouse and supportive daugher   Former Smoker -  quit tobacco 25 years ago     Alcohol use-yes (small glass of wine)                   ROS ALL NEGATIVE EXCEPT THOSE NOTED IN HPI  PE  General Appearance: well developed, well nourished in no acute distress, elderly, looks younger than stated age HEENT: symmetrical face, PERRLA, good dentition  Neck: no JVD, thyromegaly, or adenopathy, trachea midline Chest: symmetric without deformity Cardiac: PMI non-displaced, RRR, normal S1, S2, no gallop, soft systolic murmur Lung: clear to ausculation and percussion Vascular: all pulses full without bruits  Abdominal: nondistended, nontender, good bowel sounds, no HSM, no bruits Extremities: no cyanosis, clubbing, 1+ bilateral pitting edema, support hose, bilateral leg and ankle braces no sign of DVT, no varicosities  Skin: normal  color, no rashes Neuro: alert and oriented x 3, non-focal Pysch: normal affect  EKG Sinus rhythm with ventricular pacing BMET    Component Value Date/Time   NA 139 11/11/2012 0927   K 4.1 11/11/2012 0927   CL 104 11/11/2012 0927   CO2 28 11/11/2012 0927   GLUCOSE 78 11/11/2012 0927   BUN 30* 11/11/2012 0927   CREATININE 1.3 11/11/2012 0927   CALCIUM 9.6 11/11/2012 0927   GFRNONAA 51 05-02-2009 0818   GFRAA 61 05-02-2009 0818    Lipid Panel     Component Value Date/Time   CHOL 192 02/03/2013 0931   TRIG 212.0* 02/03/2013 0931   HDL 37.50* 02/03/2013 0931   CHOLHDL 5 02/03/2013 0931   VLDL 42.4* 02/03/2013 0931   LDLCALC 107* 03/04/2010 2113    CBC    Component Value Date/Time   WBC 7.9 11/11/2012 0927   RBC 4.59 11/11/2012 0927   HGB 13.7 11/11/2012 0927   HCT 40.8 11/11/2012 0927   PLT 224.0 11/11/2012 0927   MCV 88.7 11/11/2012 0927   MCHC 33.7 11/11/2012 0927   RDW 14.5 11/11/2012 0927   LYMPHSABS 1.8 11/11/2012 0927   MONOABS 1.0 11/11/2012 0927   EOSABS 0.2 11/11/2012 0927   BASOSABS 0.1 11/11/2012 1610

## 2013-04-14 NOTE — Assessment & Plan Note (Signed)
Stable by exam. Conservative management. No indication for echocardiography.

## 2013-04-14 NOTE — Patient Instructions (Addendum)
Your physician recommends that you continue on your current medications as directed. Please refer to the Current Medication list given to you today.  Your physician wants you to follow-up in: 6 months with Dr. Crenshaw. You will receive a reminder letter in the mail two months in advance. If you don't receive a letter, please call our office to schedule the follow-up appointment.  

## 2013-04-14 NOTE — Assessment & Plan Note (Signed)
Stable. No change in medical therapy. 

## 2013-04-14 NOTE — Assessment & Plan Note (Signed)
Pacemaker clinic has come over and will check for generator replacement.

## 2013-04-15 LAB — REMOTE PACEMAKER DEVICE
BMOD-0003RV: 30
BMOD-0005RV: 95 {beats}/min
BRDY-0002RV: 65 {beats}/min

## 2013-04-21 ENCOUNTER — Encounter: Payer: Medicare Other | Admitting: Internal Medicine

## 2013-04-26 ENCOUNTER — Encounter: Payer: Medicare Other | Admitting: Cardiology

## 2013-04-26 ENCOUNTER — Ambulatory Visit (INDEPENDENT_AMBULATORY_CARE_PROVIDER_SITE_OTHER): Payer: Medicare Other | Admitting: General Practice

## 2013-04-26 ENCOUNTER — Other Ambulatory Visit: Payer: Self-pay | Admitting: *Deleted

## 2013-04-26 DIAGNOSIS — I4891 Unspecified atrial fibrillation: Secondary | ICD-10-CM | POA: Diagnosis not present

## 2013-04-26 MED ORDER — NITROGLYCERIN 0.4 MG SL SUBL: 0.4000 mg | SUBLINGUAL_TABLET | SUBLINGUAL | Status: DC | PRN

## 2013-04-27 NOTE — Progress Notes (Signed)
 ELECTROPHYSIOLOGY OFFICE NOTE  Patient ID: [name] MRN: [id number], DOB/AGE: 08/04/1917   Date of Visit: 04/28/2013  Primary Physician: Valerie Leschber, MD Primary Cardiologist / EP: Wall, MD / Taylor, MD Reason for Visit: EP/device follow-up; PPM battery at [facility]  History of Present Illness  [name] is a 79 year old man with symptomatic bradcardia s/p PPM implant, CAD, PAF and HTN who presents today for electrophysiology followup. His PPM battery reached [facility] in April 2014.   Since last being seen in our clinic, he reports he is doing well. Today, he denies chest pain or shortness of breath. He denies palpitations, dizziness, near syncope or syncope. He denies LE swelling, orthopnea, PND or recent weight gain. [name] states that he is compliant and tolerating medications without difficulty.  Past Medical History Past Medical History  Diagnosis Date  . Paroxysmal atrial fibrillation   . CAD (coronary artery disease)     CABG 08/1973  . CHF (congestive heart failure)     ICM  . Pacemaker 09/2003  . Hypertension   . Hyperlipidemia   . Unsteady gait     due to B ankle DJD and instability  . Memory loss   . Chronic renal insufficiency   . Barrett's esophagus   . Diverticulosis of colon   . GERD (gastroesophageal reflux disease)   . Adenocarcinoma of prostate 10/2005    lupron injections q4mo  . Atrial fibrillation     chronic anticoag  . Arthritis     ?rheumatoid, never dx or on DMARDs  . Osteoporosis     Past Surgical History Past Surgical History  Procedure Laterality Date  . Pacemaker placement  09/2003  . Hernia repair      age 42 ( not sure if it was right or left side)  . Coronary artery bypass graft  08/1973    3 heart heart arteries were 80 percent clogged. the lower valve of my heart is not functioning  . Doppler echocardiography  2004, 2008    Allergies/Intolerances No Known Allergies  Current Home Medications Current Outpatient  Prescriptions  Medication Sig Dispense Refill  . alendronate (FOSAMAX) 70 MG tablet Take 70 mg by mouth every 7 (seven) days. Take with a full glass of water on an empty stomach.       . aspirin 81 MG tablet Take 81 mg by mouth daily.      . benazepril (LOTENSIN) 5 MG tablet TAKE 1/2 TABLET BY MOUTH ONCE A DAY  45 tablet  1  . furosemide (LASIX) 20 MG tablet Take 20 mg by mouth daily.       . metoprolol tartrate (LOPRESSOR) 25 MG tablet TAKE 1 TABLET BY MOUTH TWICE DAILY  60 tablet  5  . nitroGLYCERIN (NITROSTAT) 0.4 MG SL tablet Place 1 tablet (0.4 mg total) under the tongue every 5 (five) minutes as needed.  25 tablet  1  . ondansetron (ZOFRAN) 4 MG tablet Take 1 tablet (4 mg total) by mouth every 8 (eight) hours as needed for nausea (vomiting).  30 tablet  0  . potassium chloride (K-DUR,KLOR-CON) 10 MEQ tablet TAKE 1 TABLET BY MOUTH DAILY  30 tablet  5  . pravastatin (PRAVACHOL) 10 MG tablet TAKE ONE TABLET BY MOUTH EVERY EVENING  90 tablet  1  . ranitidine (ZANTAC) 150 MG tablet Take 1 tablet (150 mg total) by mouth at bedtime.  30 tablet  5  . warfarin (COUMADIN) 1 MG tablet Take 1 tablet (1   mg total) by mouth as directed. Take as directed by coumadin clinic.  45 tablet  3  . [DISCONTINUED] potassium chloride (KLOR-CON) 10 MEQ CR tablet Take 1 tablet (10 mEq total) by mouth daily.  30 tablet  3   No current facility-administered medications for this visit.   Social History History   Social History  . Marital Status: Married    Spouse Name: N/A    Number of Children: N/A  . Years of Education: N/A   Occupational History  . Not on file.   Social History Main Topics  . Smoking status: Former Smoker    Quit date: 08/05/1986  . Smokeless tobacco: Not on file  . Alcohol Use: Yes     Comment: samll glass of wine  . Drug Use: Not on file  . Sexually Active: Not on file   Other Topics Concern  . Not on file   Social History Narrative   Married  - lives with spouse and supportive  daugher   Former Smoker -  quit tobacco 25 years ago     Alcohol use-yes (small glass of wine)                   Review of Systems General: No chills, fever, night sweats or weight changes Cardiovascular: No chest pain, dyspnea on exertion, edema, orthopnea, palpitations, paroxysmal nocturnal dyspnea Dermatological: No rash, lesions or masses Respiratory: No cough, dyspnea Urologic: No hematuria, dysuria Abdominal: No nausea, vomiting, diarrhea, bright red blood per rectum, melena, or hematemesis Neurologic: No visual changes, weakness, changes in mental status All other systems reviewed and are otherwise negative except as noted above.  Physical Exam Blood pressure 98/58, pulse 68, height 5' 3" (1.6 m), weight 183 lb (83.008 kg).  General: Well developed, well appearing 79 year old male in no acute distress. HEENT: Normocephalic, atraumatic. EOMs intact. Sclera nonicteric. Oropharynx clear.  Neck: Supple. No JVD. Lungs: Respirations regular and unlabored, CTA bilaterally. No wheezes, rales or rhonchi. Heart: RRR. S1, S2 present. III/VI systolic murmur RUSB. No diastolic murmur, rub, S3 or S4. Abdomen: Soft, non-distended.  Extremities: No clubbing, cyanosis or edema. PT/Radials 2+ and equal bilaterally. Psych: Normal affect. Neuro: Alert and oriented X 3. Moves all extremities spontaneously.   Diagnostics Device interrogation today shows battery at [facility] since 03/01/2013; battery voltage 2.61; normal sensing measurement; impedance 619; V paced 38.7% of time; 0 VHR episodes  Assessment and Plan  1. PPM battery at [facility] Battery at [facility] since 03/01/2013 Reviewed indications for PPM generator change and procedure involved including risks and benefits. Risks include but are not limited to lead dislodgement, infection and bleeding. He and his daughter expressed verbal understanding and agree to proceed. This will be done on Monday, May 02, 2013.  Signed, Gearline Spilman,  PA-C 04/28/2013, 11:51 AM    

## 2013-04-28 ENCOUNTER — Ambulatory Visit (INDEPENDENT_AMBULATORY_CARE_PROVIDER_SITE_OTHER): Payer: Medicare Other | Admitting: Cardiology

## 2013-04-28 ENCOUNTER — Encounter: Payer: Self-pay | Admitting: *Deleted

## 2013-04-28 ENCOUNTER — Encounter: Payer: Self-pay | Admitting: Cardiology

## 2013-04-28 VITALS — BP 98/58 | HR 68 | Ht 63.0 in | Wt 183.0 lb

## 2013-04-28 DIAGNOSIS — Z950 Presence of cardiac pacemaker: Secondary | ICD-10-CM

## 2013-04-28 DIAGNOSIS — R001 Bradycardia, unspecified: Secondary | ICD-10-CM

## 2013-04-28 DIAGNOSIS — I509 Heart failure, unspecified: Secondary | ICD-10-CM

## 2013-04-28 DIAGNOSIS — Z45010 Encounter for checking and testing of cardiac pacemaker pulse generator [battery]: Secondary | ICD-10-CM

## 2013-04-28 DIAGNOSIS — I4891 Unspecified atrial fibrillation: Secondary | ICD-10-CM | POA: Diagnosis not present

## 2013-04-28 DIAGNOSIS — Z45018 Encounter for adjustment and management of other part of cardiac pacemaker: Secondary | ICD-10-CM

## 2013-04-28 DIAGNOSIS — I498 Other specified cardiac arrhythmias: Secondary | ICD-10-CM | POA: Diagnosis not present

## 2013-04-28 LAB — BASIC METABOLIC PANEL
BUN: 23 mg/dL (ref 6–23)
GFR: 65.58 mL/min (ref >60.00)
Potassium: 4.2 mEq/L (ref 3.5–5.1)
Sodium: 139 mEq/L (ref 135–145)

## 2013-04-28 LAB — PROTIME-INR
INR: 2.3 ratio — ABNORMAL HIGH (ref 0.8–1.0)
Prothrombin Time: 23.7 s — ABNORMAL HIGH (ref 10.2–12.4)

## 2013-04-28 NOTE — Patient Instructions (Addendum)
Your physician has recommended that you have a pacemaker GENERATOR CHANGE WITH DR. Ladona Ridgel. Please see the instruction sheet given to you today for more information  HOLD LASIX THE MORNING OF PROCEDURE 05-02-2013

## 2013-04-29 LAB — CBC WITH DIFFERENTIAL/PLATELET
Basophils Absolute: 0.1 10*3/uL (ref 0.0–0.1)
Eosinophils Absolute: 0.3 10*3/uL (ref 0.0–0.7)
Lymphocytes Relative: 29.1 % (ref 12.0–46.0)
MCHC: 33.7 g/dL (ref 30.0–36.0)
MCV: 91.2 fl (ref 78.0–100.0)
Monocytes Absolute: 0.9 10*3/uL (ref 0.1–1.0)
Neutrophils Relative %: 52.6 % (ref 43.0–77.0)
Platelets: 194 10*3/uL (ref 150.0–400.0)
RDW: 15 % — ABNORMAL HIGH (ref 11.5–14.6)

## 2013-05-01 DIAGNOSIS — Z79899 Other long term (current) drug therapy: Secondary | ICD-10-CM | POA: Diagnosis not present

## 2013-05-01 DIAGNOSIS — M810 Age-related osteoporosis without current pathological fracture: Secondary | ICD-10-CM | POA: Diagnosis not present

## 2013-05-01 DIAGNOSIS — I129 Hypertensive chronic kidney disease with stage 1 through stage 4 chronic kidney disease, or unspecified chronic kidney disease: Secondary | ICD-10-CM | POA: Diagnosis not present

## 2013-05-01 DIAGNOSIS — I4891 Unspecified atrial fibrillation: Secondary | ICD-10-CM | POA: Diagnosis not present

## 2013-05-01 DIAGNOSIS — K219 Gastro-esophageal reflux disease without esophagitis: Secondary | ICD-10-CM | POA: Diagnosis not present

## 2013-05-01 DIAGNOSIS — E785 Hyperlipidemia, unspecified: Secondary | ICD-10-CM | POA: Diagnosis not present

## 2013-05-01 DIAGNOSIS — N189 Chronic kidney disease, unspecified: Secondary | ICD-10-CM | POA: Diagnosis not present

## 2013-05-01 DIAGNOSIS — K2270 Barrett's esophagus without dysplasia: Secondary | ICD-10-CM | POA: Diagnosis not present

## 2013-05-01 DIAGNOSIS — K5730 Diverticulosis of large intestine without perforation or abscess without bleeding: Secondary | ICD-10-CM | POA: Diagnosis not present

## 2013-05-01 DIAGNOSIS — Z87891 Personal history of nicotine dependence: Secondary | ICD-10-CM | POA: Diagnosis not present

## 2013-05-01 DIAGNOSIS — Z9221 Personal history of antineoplastic chemotherapy: Secondary | ICD-10-CM | POA: Diagnosis not present

## 2013-05-01 DIAGNOSIS — Z7901 Long term (current) use of anticoagulants: Secondary | ICD-10-CM | POA: Diagnosis not present

## 2013-05-01 DIAGNOSIS — Z8546 Personal history of malignant neoplasm of prostate: Secondary | ICD-10-CM | POA: Diagnosis not present

## 2013-05-01 DIAGNOSIS — I428 Other cardiomyopathies: Secondary | ICD-10-CM | POA: Diagnosis not present

## 2013-05-01 DIAGNOSIS — M129 Arthropathy, unspecified: Secondary | ICD-10-CM | POA: Diagnosis not present

## 2013-05-01 DIAGNOSIS — I2510 Atherosclerotic heart disease of native coronary artery without angina pectoris: Secondary | ICD-10-CM | POA: Diagnosis not present

## 2013-05-01 DIAGNOSIS — Z45018 Encounter for adjustment and management of other part of cardiac pacemaker: Secondary | ICD-10-CM | POA: Diagnosis not present

## 2013-05-01 DIAGNOSIS — I509 Heart failure, unspecified: Secondary | ICD-10-CM | POA: Diagnosis not present

## 2013-05-01 DIAGNOSIS — I495 Sick sinus syndrome: Secondary | ICD-10-CM | POA: Diagnosis not present

## 2013-05-01 DIAGNOSIS — Z7982 Long term (current) use of aspirin: Secondary | ICD-10-CM | POA: Diagnosis not present

## 2013-05-01 MED ORDER — SODIUM CHLORIDE 0.9 % IR SOLN
80.0000 mg | Status: AC
  Filled 2013-05-01: qty 2

## 2013-05-01 MED ORDER — CEFAZOLIN SODIUM-DEXTROSE 2-3 GM-% IV SOLR
2.0000 g | INTRAVENOUS | Status: AC
  Filled 2013-05-01: qty 50

## 2013-05-02 ENCOUNTER — Ambulatory Visit (HOSPITAL_COMMUNITY)
Admission: RE | Admit: 2013-05-02 | Discharge: 2013-05-02 | Disposition: A | Discharge status: Home / Self Care | Payer: Medicare Other | Source: Ambulatory Visit | Attending: Internal Medicine | Admitting: Internal Medicine

## 2013-05-02 ENCOUNTER — Encounter (HOSPITAL_COMMUNITY)
Admission: RE | Disposition: A | Discharge status: Home / Self Care | Payer: Self-pay | Source: Ambulatory Visit | Attending: Internal Medicine

## 2013-05-02 DIAGNOSIS — I428 Other cardiomyopathies: Secondary | ICD-10-CM | POA: Insufficient documentation

## 2013-05-02 DIAGNOSIS — N189 Chronic kidney disease, unspecified: Secondary | ICD-10-CM | POA: Insufficient documentation

## 2013-05-02 DIAGNOSIS — I509 Heart failure, unspecified: Secondary | ICD-10-CM | POA: Insufficient documentation

## 2013-05-02 DIAGNOSIS — M129 Arthropathy, unspecified: Secondary | ICD-10-CM | POA: Insufficient documentation

## 2013-05-02 DIAGNOSIS — I2510 Atherosclerotic heart disease of native coronary artery without angina pectoris: Secondary | ICD-10-CM | POA: Insufficient documentation

## 2013-05-02 DIAGNOSIS — K2270 Barrett's esophagus without dysplasia: Secondary | ICD-10-CM | POA: Insufficient documentation

## 2013-05-02 DIAGNOSIS — M810 Age-related osteoporosis without current pathological fracture: Secondary | ICD-10-CM | POA: Insufficient documentation

## 2013-05-02 DIAGNOSIS — I495 Sick sinus syndrome: Secondary | ICD-10-CM | POA: Diagnosis not present

## 2013-05-02 DIAGNOSIS — Z8546 Personal history of malignant neoplasm of prostate: Secondary | ICD-10-CM | POA: Insufficient documentation

## 2013-05-02 DIAGNOSIS — I4891 Unspecified atrial fibrillation: Secondary | ICD-10-CM | POA: Insufficient documentation

## 2013-05-02 DIAGNOSIS — K219 Gastro-esophageal reflux disease without esophagitis: Secondary | ICD-10-CM | POA: Insufficient documentation

## 2013-05-02 DIAGNOSIS — Z7982 Long term (current) use of aspirin: Secondary | ICD-10-CM | POA: Insufficient documentation

## 2013-05-02 DIAGNOSIS — Z79899 Other long term (current) drug therapy: Secondary | ICD-10-CM | POA: Insufficient documentation

## 2013-05-02 DIAGNOSIS — Z45018 Encounter for adjustment and management of other part of cardiac pacemaker: Secondary | ICD-10-CM | POA: Insufficient documentation

## 2013-05-02 DIAGNOSIS — Z87891 Personal history of nicotine dependence: Secondary | ICD-10-CM | POA: Insufficient documentation

## 2013-05-02 DIAGNOSIS — E785 Hyperlipidemia, unspecified: Secondary | ICD-10-CM | POA: Insufficient documentation

## 2013-05-02 DIAGNOSIS — I129 Hypertensive chronic kidney disease with stage 1 through stage 4 chronic kidney disease, or unspecified chronic kidney disease: Secondary | ICD-10-CM | POA: Insufficient documentation

## 2013-05-02 DIAGNOSIS — K5730 Diverticulosis of large intestine without perforation or abscess without bleeding: Secondary | ICD-10-CM | POA: Insufficient documentation

## 2013-05-02 DIAGNOSIS — Z9221 Personal history of antineoplastic chemotherapy: Secondary | ICD-10-CM | POA: Insufficient documentation

## 2013-05-02 DIAGNOSIS — Z7901 Long term (current) use of anticoagulants: Secondary | ICD-10-CM | POA: Insufficient documentation

## 2013-05-02 HISTORY — PX: PACEMAKER GENERATOR CHANGE: SHX5481

## 2013-05-02 SURGERY — PACEMAKER GENERATOR CHANGE
Anesthesia: LOCAL

## 2013-05-02 MED ORDER — CHLORHEXIDINE GLUCONATE 4 % EX LIQD
60.0000 mL | Freq: Once | CUTANEOUS | Status: DC
  Filled 2013-05-02: qty 60

## 2013-05-02 MED ORDER — MUPIROCIN 2 % EX OINT
TOPICAL_OINTMENT | CUTANEOUS | Status: AC
  Filled 2013-05-02: qty 22

## 2013-05-02 MED ORDER — ONDANSETRON HCL 4 MG/2ML IJ SOLN: 4.0000 mg | Freq: Four times a day (QID) | INTRAMUSCULAR | Status: DC | PRN

## 2013-05-02 MED ORDER — SODIUM CHLORIDE 0.9 % IV SOLN
INTRAVENOUS | Status: DC
  Administered 2013-05-02: 12:00:00 via INTRAVENOUS

## 2013-05-02 MED ORDER — CEFAZOLIN SODIUM-DEXTROSE 2-3 GM-% IV SOLR
INTRAVENOUS | Status: AC
  Filled 2013-05-02: qty 50

## 2013-05-02 MED ORDER — MUPIROCIN 2 % EX OINT
TOPICAL_OINTMENT | Freq: Two times a day (BID) | CUTANEOUS | Status: DC
  Administered 2013-05-02: 1 via NASAL
  Filled 2013-05-02: qty 22

## 2013-05-02 MED ORDER — ACETAMINOPHEN 325 MG PO TABS: 325.0000 mg | ORAL_TABLET | ORAL | Status: DC | PRN

## 2013-05-02 NOTE — Op Note (Signed)
PPM removal and insertion of a new device without immediate complication. W#[id number].

## 2013-05-02 NOTE — H&P (View-Only) (Signed)
ELECTROPHYSIOLOGY OFFICE NOTE  Patient ID: [name] MRN: [id number], DOB/AGE: 1917/09/06   Date of Visit: 04/28/2013  Primary Physician: Rene Paci, MD Primary Cardiologist / EP: Daleen Squibb, MD / Ladona Ridgel, MD Reason for Visit: EP/device follow-up; PPM battery at [facility]  History of Present Illness  [name] is a 79 year old man with symptomatic bradcardia s/p PPM implant, CAD, PAF and HTN who presents today for electrophysiology followup. His PPM battery reached [facility] in April 2014.   Since last being seen in our clinic, he reports he is doing well. Today, he denies chest pain or shortness of breath. He denies palpitations, dizziness, near syncope or syncope. He denies LE swelling, orthopnea, PND or recent weight gain. Mr. Schnitker states that he is compliant and tolerating medications without difficulty.  Past Medical History Past Medical History  Diagnosis Date  . Paroxysmal atrial fibrillation   . CAD (coronary artery disease)     CABG 08/1973  . CHF (congestive heart failure)     ICM  . Pacemaker 09/2003  . Hypertension   . Hyperlipidemia   . Unsteady gait     due to B ankle DJD and instability  . Memory loss   . Chronic renal insufficiency   . Barrett's esophagus   . Diverticulosis of colon   . GERD (gastroesophageal reflux disease)   . Adenocarcinoma of prostate 10/2005    lupron injections q65mo  . Atrial fibrillation     chronic anticoag  . Arthritis     ?rheumatoid, never dx or on DMARDs  . Osteoporosis     Past Surgical History Past Surgical History  Procedure Laterality Date  . Pacemaker placement  09/2003  . Hernia repair      age 71 ( not sure if it was right or left side)  . Coronary artery bypass graft  08/1973    3 heart heart arteries were 80 percent clogged. the lower valve of my heart is not functioning  . Doppler echocardiography  2004, 2008    Allergies/Intolerances No Known Allergies  Current Home Medications Current Outpatient  Prescriptions  Medication Sig Dispense Refill  . alendronate (FOSAMAX) 70 MG tablet Take 70 mg by mouth every 7 (seven) days. Take with a full glass of water on an empty stomach.       Marland Kitchen aspirin 81 MG tablet Take 81 mg by mouth daily.      . benazepril (LOTENSIN) 5 MG tablet TAKE 1/2 TABLET BY MOUTH ONCE A DAY  45 tablet  1  . furosemide (LASIX) 20 MG tablet Take 20 mg by mouth daily.       . metoprolol tartrate (LOPRESSOR) 25 MG tablet TAKE 1 TABLET BY MOUTH TWICE DAILY  60 tablet  5  . nitroGLYCERIN (NITROSTAT) 0.4 MG SL tablet Place 1 tablet (0.4 mg total) under the tongue every 5 (five) minutes as needed.  25 tablet  1  . ondansetron (ZOFRAN) 4 MG tablet Take 1 tablet (4 mg total) by mouth every 8 (eight) hours as needed for nausea (vomiting).  30 tablet  0  . potassium chloride (K-DUR,KLOR-CON) 10 MEQ tablet TAKE 1 TABLET BY MOUTH DAILY  30 tablet  5  . pravastatin (PRAVACHOL) 10 MG tablet TAKE ONE TABLET BY MOUTH EVERY EVENING  90 tablet  1  . ranitidine (ZANTAC) 150 MG tablet Take 1 tablet (150 mg total) by mouth at bedtime.  30 tablet  5  . warfarin (COUMADIN) 1 MG tablet Take 1 tablet (1  mg total) by mouth as directed. Take as directed by coumadin clinic.  45 tablet  3  . [DISCONTINUED] potassium chloride (KLOR-CON) 10 MEQ CR tablet Take 1 tablet (10 mEq total) by mouth daily.  30 tablet  3   No current facility-administered medications for this visit.   Social History History   Social History  . Marital Status: Married    Spouse Name: [name]    Number of Children: [name]  . Years of Education: [name]   Occupational History  . Not on file.   Social History Main Topics  . Smoking status: Former Smoker    Quit date: 08/05/1986  . Smokeless tobacco: Not on file  . Alcohol Use: Yes     Comment: samll glass of wine  . Drug Use: Not on file  . Sexually Active: Not on file   Other Topics Concern  . Not on file   Social History Narrative   Married  - lives with spouse and supportive  daugher   Former Smoker -  quit tobacco 25 years ago     Alcohol use-yes (small glass of wine)                   Review of Systems General: No chills, fever, night sweats or weight changes Cardiovascular: No chest pain, dyspnea on exertion, edema, orthopnea, palpitations, paroxysmal nocturnal dyspnea Dermatological: No rash, lesions or masses Respiratory: No cough, dyspnea Urologic: No hematuria, dysuria Abdominal: No nausea, vomiting, diarrhea, bright red blood per rectum, melena, or hematemesis Neurologic: No visual changes, weakness, changes in mental status All other systems reviewed and are otherwise negative except as noted above.  Physical Exam Blood pressure 98/58, pulse 68, height 5\' 3"  (1.6 m), weight 183 lb (83.008 kg).  General: Well developed, well appearing 79 year old male in no acute distress. HEENT: Normocephalic, atraumatic. EOMs intact. Sclera nonicteric. Oropharynx clear.  Neck: Supple. No JVD. Lungs: Respirations regular and unlabored, CTA bilaterally. No wheezes, rales or rhonchi. Heart: RRR. S1, S2 present. III/VI systolic murmur RUSB. No diastolic murmur, rub, S3 or S4. Abdomen: Soft, non-distended.  Extremities: No clubbing, cyanosis or edema. PT/Radials 2+ and equal bilaterally. Psych: Normal affect. Neuro: Alert and oriented X 3. Moves all extremities spontaneously.   Diagnostics Device interrogation today shows battery at E[facility] since 03/01/2013; battery voltage 2.61; normal sensing measurement; impedance 619; V paced 38.7% of time; 0 VHR episodes  Assessment and Plan  1. PPM battery at [facility] Battery at [facility] since 03/01/2013 Reviewed indications for PPM generator change and procedure involved including risks and benefits. Risks include but are not limited to lead dislodgement, infection and bleeding. He and his daughter expressed verbal understanding and agree to proceed. This will be done on Monday, May 02, 2013.  Signed, Rick Duff,  PA-C 04/28/2013, 11:51 AM

## 2013-05-02 NOTE — Interval H&P Note (Signed)
History and Physical Interval Note: Patient seen and examined. Agree with above. I have discussed the risks/benefits/goals/expectations of PPM removal and insertion of a new device with the patient and he wishes to proceed. 05/02/2013 3:15 PM  [name]  has presented today for surgery, with the diagnosis of End of life  The various methods of treatment have been discussed with the patient and family. After consideration of risks, benefits and other options for treatment, the patient has consented to  Procedure(s): PACEMAKER GENERATOR CHANGE (N/A) as a surgical intervention .  The patient's history has been reviewed, patient examined, no change in status, stable for surgery.  I have reviewed the patient's chart and labs.  Questions were answered to the patient's satisfaction.     [name]

## 2013-05-02 NOTE — Op Note (Signed)
[name], [name] NO.:  [id number]  MEDICAL RECORD NO.:  [id number]  LOCATION:  [facility]                         FACILITY:  [facility]  PHYSICIAN:  Doylene Canning. Ladona Ridgel, MD    DATE OF BIRTH:  [birth year]  DATE OF PROCEDURE:  05/02/2013 DATE OF DISCHARGE:  05/02/2013                              OPERATIVE REPORT   PROCEDURE PERFORMED:  Removal of previously implanted dual-chamber pacemaker, which had reached elective replacement and insertion of a new dual-chamber device.  INDICATION:  Symptomatic bradycardia secondary to sinus node dysfunction.  INSTRUCTION:  The patient is a very pleasant 79 year old man who has a longstanding history of sinus bradycardia and sinus node dysfunction. He is status post permanent pacemaker insertion.  He has reached elective replacement and now presents for removal of his old device and insertion of a new one.  DESCRIPTION OF PROCEDURE:  After informed consent was obtained, the patient was taken to the diagnostic EP lab in a fasting state.  After usual preparation and draping, intravenous fentanyl and midazolam was given for sedation.  A 30 mL of lidocaine was infiltrated into the left infraclavicular region.  A 5-cm incision was carried out over this region.  Electrocautery was utilized to dissect down to the fascial plane.  The pacemaker pocket was entered with electrocautery, and the generator was removed with gentle traction.  The leads were evaluated and found to be working normally.  His underlying rhythm was atrial fib/flutter.  The new Medtronic Sensia dual-chamber pacemaker was connected to the old atrial and ventricular leads and placed back in the subcutaneous pocket.  The pocket was irrigated with antibiotic irrigation.  The incision was closed with 2-0 and 3-0 Vicryl.  Benzoin and Steri-Strips were painted on the skin, pressure dressing was applied, and the patient was returned to his room in  satisfactory condition.  COMPLICATIONS:  There were no immediate procedure complications.  RESULTS:  This demonstrate successful removal of an old dual-chamber pacemaker, which reached elective replacement and insertion of a new dual-chamber device without immediate procedure complications.     Doylene Canning. Ladona Ridgel, MD     GWT/MEDQ  D:  05/02/2013  T:  05/02/2013  Job:  [id number]

## 2013-05-06 ENCOUNTER — Telehealth: Payer: Self-pay | Admitting: Internal Medicine

## 2013-05-06 NOTE — Telephone Encounter (Signed)
Left message on machine to inform that wound check is set for 7/2 at 330pm per Belenda Cruise.

## 2013-05-06 NOTE — Telephone Encounter (Signed)
Spoke with patient's daughter. He had a pacemaker change out on 6/23 and did not get a post hosp wound check appointment. Advised will ask Belenda Cruise and call her back.

## 2013-05-06 NOTE — Telephone Encounter (Signed)
New problem    Calling to see if pt will be needing a f/u appt

## 2013-05-09 ENCOUNTER — Telehealth: Payer: Self-pay | Admitting: Internal Medicine

## 2013-05-09 DIAGNOSIS — L600 Ingrowing nail: Secondary | ICD-10-CM

## 2013-05-09 NOTE — Telephone Encounter (Signed)
Ok - refer done 

## 2013-05-09 NOTE — Telephone Encounter (Signed)
Notified pt with md response.../lmb 

## 2013-05-09 NOTE — Telephone Encounter (Signed)
Pt's daughter called req referral for podiatrist. Pt's daughter is concern and wondering if we can get the one that can take care of pt's nails due to diabetes. Please advise.

## 2013-05-11 ENCOUNTER — Encounter: Payer: Self-pay | Admitting: Internal Medicine

## 2013-05-11 ENCOUNTER — Ambulatory Visit (INDEPENDENT_AMBULATORY_CARE_PROVIDER_SITE_OTHER): Payer: Medicare Other | Admitting: *Deleted

## 2013-05-11 DIAGNOSIS — I4891 Unspecified atrial fibrillation: Secondary | ICD-10-CM | POA: Diagnosis not present

## 2013-05-11 DIAGNOSIS — Z950 Presence of cardiac pacemaker: Secondary | ICD-10-CM | POA: Diagnosis not present

## 2013-05-11 LAB — PACEMAKER DEVICE OBSERVATION
AL THRESHOLD: 0.75 V
ATRIAL PACING PM: 40
BAMS-0001: 140 {beats}/min
RV LEAD IMPEDENCE PM: 627 Ohm
RV LEAD THRESHOLD: 1 V

## 2013-05-11 NOTE — Progress Notes (Signed)
Pt seen in device clinic for follow up of recently replaced pacemaker.  Wound well healed.  No redness, swelling, or edema.  Steri-strips removed today.   Device interrogated and found to be functioning normally.  No changes made today. See PaceArt for full details.  Pt denies chest pain, shortness of breath, palpitations, or dizziness.  Pt to follow up with Dr. Ladona Ridgel in 3 months.   Jim Philemon 05/11/2013 3:41 PM

## 2013-05-12 DIAGNOSIS — C61 Malignant neoplasm of prostate: Secondary | ICD-10-CM | POA: Diagnosis not present

## 2013-05-16 DIAGNOSIS — H6120 Impacted cerumen, unspecified ear: Secondary | ICD-10-CM | POA: Diagnosis not present

## 2013-05-17 ENCOUNTER — Ambulatory Visit (INDEPENDENT_AMBULATORY_CARE_PROVIDER_SITE_OTHER): Payer: Medicare Other | Admitting: Internal Medicine

## 2013-05-17 ENCOUNTER — Encounter: Payer: Self-pay | Admitting: Internal Medicine

## 2013-05-17 VITALS — BP 102/60 | HR 78 | Temp 97.1°F | Wt 182.0 lb

## 2013-05-17 DIAGNOSIS — M5137 Other intervertebral disc degeneration, lumbosacral region: Secondary | ICD-10-CM

## 2013-05-17 DIAGNOSIS — K219 Gastro-esophageal reflux disease without esophagitis: Secondary | ICD-10-CM | POA: Diagnosis not present

## 2013-05-17 DIAGNOSIS — M545 Low back pain, unspecified: Secondary | ICD-10-CM

## 2013-05-17 DIAGNOSIS — M51379 Other intervertebral disc degeneration, lumbosacral region without mention of lumbar back pain or lower extremity pain: Secondary | ICD-10-CM

## 2013-05-17 DIAGNOSIS — Z950 Presence of cardiac pacemaker: Secondary | ICD-10-CM | POA: Diagnosis not present

## 2013-05-17 DIAGNOSIS — M5136 Other intervertebral disc degeneration, lumbar region: Secondary | ICD-10-CM

## 2013-05-17 MED ORDER — TIZANIDINE HCL 4 MG PO TABS: 4.0000 mg | ORAL_TABLET | Freq: Four times a day (QID) | ORAL | Status: DC | PRN

## 2013-05-17 MED ORDER — RANITIDINE HCL 150 MG PO TABS: 300.0000 mg | ORAL_TABLET | Freq: Every day | ORAL | Status: DC

## 2013-05-17 MED ORDER — PREDNISONE (PAK) 10 MG PO TABS: 10.0000 mg | ORAL_TABLET | ORAL | Status: DC

## 2013-05-17 NOTE — Assessment & Plan Note (Signed)
S/p generator replacement 04/2013 - doing well

## 2013-05-17 NOTE — Assessment & Plan Note (Signed)
Changed PPI to H2B 11/2012 due to change in bowels  BM normalized but nocturnal GERD symptoms uncontrolled 2-3x/week Increase H2B to max dose Pt will call if unimproved in next 30d, sooner if worse

## 2013-05-17 NOTE — Patient Instructions (Signed)
It was good to see you today. Pred taper over next 6 days and muscle relaxer as needed for back pain/spasm - Your prescription(s) have been submitted to your pharmacy. Please take as directed and contact our office if you believe you are having problem(s) with the medication(s). Increase Zantac to 300mg  at bedtime follow up in 1-2 weeks if pain unimproved, sooner if worse  Back Exercises Back exercises help treat and prevent back injuries. The goal of back exercises is to increase the strength of your abdominal and back muscles and the flexibility of your back. These exercises should be started when you no longer have back pain. Back exercises include:  Pelvic Tilt. Lie on your back with your knees bent. Tilt your pelvis until the lower part of your back is against the floor. Hold this position 5 to 10 sec and repeat 5 to 10 times.  Knee to Chest. Pull first 1 knee up against your chest and hold for 20 to 30 seconds, repeat this with the other knee, and then both knees. This may be done with the other leg straight or bent, whichever feels better.  Sit-Ups or Curl-Ups. Bend your knees 90 degrees. Start with tilting your pelvis, and do a partial, slow sit-up, lifting your trunk only 30 to 45 degrees off the floor. Take at least 2 to 3 seconds for each sit-up. Do not do sit-ups with your knees out straight. If partial sit-ups are difficult, simply do the above but with only tightening your abdominal muscles and holding it as directed.  Hip-Lift. Lie on your back with your knees flexed 90 degrees. Push down with your feet and shoulders as you raise your hips a couple inches off the floor; hold for 10 seconds, repeat 5 to 10 times.  Back arches. Lie on your stomach, propping yourself up on bent elbows. Slowly press on your hands, causing an arch in your low back. Repeat 3 to 5 times. Any initial stiffness and discomfort should lessen with repetition over time.  Shoulder-Lifts. Lie face down with arms  beside your body. Keep hips and torso pressed to floor as you slowly lift your head and shoulders off the floor. Do not overdo your exercises, especially in the beginning. Exercises may cause you some mild back discomfort which lasts for a few minutes; however, if the pain is more severe, or lasts for more than 15 minutes, do not continue exercises until you see your caregiver. Improvement with exercise therapy for back problems is slow.  See your caregivers for assistance with developing a proper back exercise program. Document Released: 12/04/2004 Document Revised: 01/19/2012 Document Reviewed: 08/28/2011 [facility] Patient Information 2014 [place], [place].

## 2013-05-17 NOTE — Progress Notes (Signed)
Subjective:    Patient ID: [name], male    DOB: 1917/10/23, 80 y.o.   MRN: [id number]  HPI  complains of low back pain Located to L of spine, above buttock No radiation Different than prior compression fx pain Denies precipitating overuse, fall or injury Denies rash, bruise or leg weakness Pain ongoing 2 weeks, unchanged since onset Pain worse with movement and turning onto L side when lying in bed - pain improved with inactivity/rest  Past Medical History  Diagnosis Date  . Paroxysmal atrial fibrillation   . CAD (coronary artery disease)     CABG 08/1973  . CHF (congestive heart failure)     ICM  . Pacemaker 09/2003  . Hypertension   . Hyperlipidemia   . Unsteady gait     due to B ankle DJD and instability  . Memory loss   . Chronic renal insufficiency   . Barrett's esophagus   . Diverticulosis of colon   . GERD (gastroesophageal reflux disease)   . Adenocarcinoma of prostate 10/2005    lupron injections q51mo  . Atrial fibrillation     chronic anticoag  . Arthritis     ?rheumatoid, never dx or on DMARDs  . Osteoporosis     Review of Systems  Constitutional: Negative for fever and unexpected weight change.  Gastrointestinal: Negative for nausea and vomiting.  Genitourinary: Negative for hematuria, decreased urine volume, scrotal swelling, difficulty urinating and testicular pain.  Musculoskeletal: Positive for back pain.       Objective:   Physical Exam BP 102/60  Pulse 78  Temp(Src) 97.1 F (36.2 C) (Oral)  Wt 182 lb (82.555 kg)  BMI 28.73 kg/m2  SpO2 97% Wt Readings from Last 3 Encounters:  05/17/13 182 lb (82.555 kg)  05/02/13 178 lb (80.74 kg)  05/02/13 178 lb (80.74 kg)   Constitutional: he appears well-developed and well-nourished. No distress. Using RW - Dtr at side Neck: Normal range of motion. Neck supple. No JVD present. No thyromegaly present.  Cardiovascular: Normal rate, regular rhythm and normal heart sounds.  No murmur heard. No  BLE edema. Pulmonary/Chest: Effort normal and breath sounds normal. No respiratory distress. he has no wheezes.  Abdominal: Soft. Bowel sounds are normal. he exhibits no distension. There is no tenderness. no masses Musculoskeletal: Back: full range of motion of thoracic and lumbar spine. Non tender to palpation. Positive ipsilateral straight leg raise. DTR's are symmetrically intact. Sensation intact in all dermatomes of the lower extremities. Full and symmetric strength to manual muscle testing. patient uses RW and ambulates with antalgic gait. Skin: Skin is warm and dry. No rash noted. No erythema.  Psychiatric: he has a normal mood and affect. behavior is normal. Judgment and thought content normal.   Lab Results  Component Value Date   WBC 6.9 04/28/2013   HGB 13.6 04/28/2013   HCT 40.2 04/28/2013   PLT 194.0 04/28/2013   GLUCOSE 103* 04/28/2013   CHOL 192 02/03/2013   TRIG 212.0* 02/03/2013   HDL 37.50* 02/03/2013   LDLDIRECT 112.5 02/03/2013   LDLCALC 107* 03/04/2010   ALT 23 02/03/2013   AST 24 02/03/2013   NA 139 04/28/2013   K 4.2 04/28/2013   CL 105 04/28/2013   CREATININE 1.1 04/28/2013   BUN 23 04/28/2013   CO2 26 04/28/2013   TSH 3.95 11/11/2012   INR 1.95* 05/02/2013       Assessment & Plan:   Lumbago Lumbar DDD  Suspect acute DDD flare - no hx/exam  nerve impingement/weakness or neuro defict tx with pred taper for inflammation and muscle relaxer Gentle back exercises Pt and family to call if unimproved in 2 weeks to consider other imaging if needed (no MRI due to PPM), sooner if symptoms worse

## 2013-05-24 ENCOUNTER — Ambulatory Visit (INDEPENDENT_AMBULATORY_CARE_PROVIDER_SITE_OTHER): Payer: Medicare Other | Admitting: General Practice

## 2013-05-24 DIAGNOSIS — C61 Malignant neoplasm of prostate: Secondary | ICD-10-CM | POA: Diagnosis not present

## 2013-05-24 DIAGNOSIS — I4891 Unspecified atrial fibrillation: Secondary | ICD-10-CM

## 2013-05-24 LAB — POCT INR: INR: 2.7

## 2013-06-04 ENCOUNTER — Other Ambulatory Visit: Payer: Self-pay | Admitting: Internal Medicine

## 2013-06-10 DIAGNOSIS — B351 Tinea unguium: Secondary | ICD-10-CM | POA: Diagnosis not present

## 2013-06-10 DIAGNOSIS — M79609 Pain in unspecified limb: Secondary | ICD-10-CM | POA: Diagnosis not present

## 2013-07-05 ENCOUNTER — Ambulatory Visit (INDEPENDENT_AMBULATORY_CARE_PROVIDER_SITE_OTHER): Payer: Medicare Other | Admitting: General Practice

## 2013-07-05 DIAGNOSIS — I4891 Unspecified atrial fibrillation: Secondary | ICD-10-CM | POA: Diagnosis not present

## 2013-07-06 ENCOUNTER — Encounter: Payer: Self-pay | Admitting: Internal Medicine

## 2013-07-06 ENCOUNTER — Ambulatory Visit (INDEPENDENT_AMBULATORY_CARE_PROVIDER_SITE_OTHER): Payer: Medicare Other | Admitting: Internal Medicine

## 2013-07-06 VITALS — BP 120/70 | HR 89 | Temp 97.3°F | Wt 187.8 lb

## 2013-07-06 DIAGNOSIS — I10 Essential (primary) hypertension: Secondary | ICD-10-CM

## 2013-07-06 DIAGNOSIS — I509 Heart failure, unspecified: Secondary | ICD-10-CM | POA: Diagnosis not present

## 2013-07-06 DIAGNOSIS — H02839 Dermatochalasis of unspecified eye, unspecified eyelid: Secondary | ICD-10-CM | POA: Diagnosis not present

## 2013-07-06 DIAGNOSIS — Z961 Presence of intraocular lens: Secondary | ICD-10-CM | POA: Diagnosis not present

## 2013-07-06 DIAGNOSIS — Z23 Encounter for immunization: Secondary | ICD-10-CM | POA: Diagnosis not present

## 2013-07-06 DIAGNOSIS — H40019 Open angle with borderline findings, low risk, unspecified eye: Secondary | ICD-10-CM | POA: Diagnosis not present

## 2013-07-06 DIAGNOSIS — I4891 Unspecified atrial fibrillation: Secondary | ICD-10-CM

## 2013-07-06 MED ORDER — VITAMIN C 500 MG PO TABS: 500.0000 mg | ORAL_TABLET | Freq: Every day | ORAL | Status: DC

## 2013-07-06 MED ORDER — ASPIRIN EC 325 MG PO TBEC: 325.0000 mg | DELAYED_RELEASE_TABLET | Freq: Every day | ORAL | Status: DC

## 2013-07-06 MED ORDER — FUROSEMIDE 40 MG PO TABS: 40.0000 mg | ORAL_TABLET | Freq: Every day | ORAL | Status: DC

## 2013-07-06 NOTE — Patient Instructions (Signed)
It was good to see you today. We have reviewed your prior records including labs and tests today Medications reviewed and updated, no changes recommended at this time.

## 2013-07-06 NOTE — Progress Notes (Signed)
  Subjective:    Patient ID: [name], male    DOB: 04-Jul-1917, 79 y.o.   MRN: [id number]  HPI  Here for follow up - reviewed concerns  Past Medical History  Diagnosis Date  . Paroxysmal atrial fibrillation   . CAD (coronary artery disease)     CABG 08/1973  . CHF (congestive heart failure)     ICM  . Pacemaker 09/2003  . Hypertension   . Hyperlipidemia   . Unsteady gait     due to B ankle DJD and instability  . Memory loss   . Chronic renal insufficiency   . Barrett's esophagus   . Diverticulosis of colon   . GERD (gastroesophageal reflux disease)   . Adenocarcinoma of prostate 10/2005    lupron injections q30mo  . Atrial fibrillation     chronic anticoag  . Arthritis     ?rheumatoid, never dx or on DMARDs  . Osteoporosis     Review of Systems  Constitutional: Negative for fever and unexpected weight change.  Respiratory: Negative for cough and shortness of breath.   Cardiovascular: Negative for chest pain and leg swelling.  Gastrointestinal: Negative for nausea and vomiting.  Genitourinary: Negative for difficulty urinating.       Objective:   Physical Exam BP 120/70  Pulse 89  Temp(Src) 97.3 F (36.3 C) (Oral)  Wt 187 lb 12.8 oz (85.186 kg)  BMI 29.65 kg/m2  SpO2 96% Wt Readings from Last 3 Encounters:  07/06/13 187 lb 12.8 oz (85.186 kg)  05/17/13 182 lb (82.555 kg)  05/02/13 178 lb (80.74 kg)   Constitutional: he appears well-developed and well-nourished. No distress. Using RW - Neck: Normal range of motion. Neck supple. No JVD present. No thyromegaly present.  Cardiovascular: Normal rate, regular rhythm and normal heart sounds.  No murmur heard. No BLE edema. Pulmonary/Chest: Effort normal and breath sounds normal. No respiratory distress. he has no wheezes.  Skin: Skin is warm and dry. No rash noted. No erythema.  Psychiatric: he has a normal mood and affect. behavior is normal. Judgment and thought content normal.   Lab Results  Component  Value Date   WBC 6.9 04/28/2013   HGB 13.6 04/28/2013   HCT 40.2 04/28/2013   PLT 194.0 04/28/2013   GLUCOSE 103* 04/28/2013   CHOL 192 02/03/2013   TRIG 212.0* 02/03/2013   HDL 37.50* 02/03/2013   LDLDIRECT 112.5 02/03/2013   LDLCALC 107* 03/04/2010   ALT 23 02/03/2013   AST 24 02/03/2013   NA 139 04/28/2013   K 4.2 04/28/2013   CL 105 04/28/2013   CREATININE 1.1 04/28/2013   BUN 23 04/28/2013   CO2 26 04/28/2013   TSH 3.95 11/11/2012   INR 2.4 07/05/2013       Assessment & Plan:   See problem list. Medications and labs reviewed today. Medication reconciliation at length today

## 2013-07-06 NOTE — Assessment & Plan Note (Signed)
Chronic anticoag - rate controlled and asymptomatic  The current medical regimen is effective;  continue present plan and medications.  

## 2013-07-06 NOTE — Assessment & Plan Note (Signed)
BP Readings from Last 3 Encounters:  07/06/13 120/70  05/17/13 102/60  05/02/13 103/62   The current medical regimen is effective;  continue present plan and medications.

## 2013-07-26 ENCOUNTER — Other Ambulatory Visit: Payer: Self-pay | Admitting: Internal Medicine

## 2013-07-27 DIAGNOSIS — H6120 Impacted cerumen, unspecified ear: Secondary | ICD-10-CM | POA: Diagnosis not present

## 2013-08-04 ENCOUNTER — Ambulatory Visit (INDEPENDENT_AMBULATORY_CARE_PROVIDER_SITE_OTHER): Payer: Medicare Other | Admitting: Internal Medicine

## 2013-08-04 ENCOUNTER — Encounter: Payer: Self-pay | Admitting: Internal Medicine

## 2013-08-04 VITALS — BP 100/62 | HR 78 | Temp 97.8°F | Wt 183.8 lb

## 2013-08-04 DIAGNOSIS — M19079 Primary osteoarthritis, unspecified ankle and foot: Secondary | ICD-10-CM | POA: Diagnosis not present

## 2013-08-04 DIAGNOSIS — K219 Gastro-esophageal reflux disease without esophagitis: Secondary | ICD-10-CM | POA: Diagnosis not present

## 2013-08-04 DIAGNOSIS — I4891 Unspecified atrial fibrillation: Secondary | ICD-10-CM

## 2013-08-04 DIAGNOSIS — I5042 Chronic combined systolic (congestive) and diastolic (congestive) heart failure: Secondary | ICD-10-CM | POA: Diagnosis not present

## 2013-08-04 DIAGNOSIS — I509 Heart failure, unspecified: Secondary | ICD-10-CM

## 2013-08-04 DIAGNOSIS — M19071 Primary osteoarthritis, right ankle and foot: Secondary | ICD-10-CM

## 2013-08-04 MED ORDER — ACETAMINOPHEN 500 MG PO TABS: 500.0000 mg | ORAL_TABLET | Freq: Four times a day (QID) | ORAL | Status: DC | PRN

## 2013-08-04 NOTE — Assessment & Plan Note (Signed)
Chronic ischemic CM without exacerbation symptoms stable, euvolemic on exam Follows with cardiology annually, no indication for a followup echocardiogram at this time given asymptomatic dz and advanced age The current medical regimen is effective;  continue present plan and medications.  

## 2013-08-04 NOTE — Patient Instructions (Signed)
It was good to see you today. We have reviewed your prior records including labs and tests today Medications reviewed and updated, try take Tylenol each AM for arthritis symptoms No other changes recommended at this time. Please schedule followup in 6 months, call sooner if problems.

## 2013-08-04 NOTE — Progress Notes (Signed)
  Subjective:    Patient ID: [name], male    DOB: Dec 14, 1916, 79 y.o.   MRN: [id number]  HPI  Here for follow up - reviewed concerns and chronic medical issues  Past Medical History  Diagnosis Date  . Paroxysmal atrial fibrillation   . CAD (coronary artery disease)     CABG 08/1973  . CHF (congestive heart failure)     ICM  . Pacemaker 09/2003  . Hypertension   . Hyperlipidemia   . Unsteady gait     due to B ankle DJD and instability  . Memory loss   . Chronic renal insufficiency   . Barrett's esophagus   . Diverticulosis of colon   . GERD (gastroesophageal reflux disease)   . Adenocarcinoma of prostate 10/2005    lupron injections q14mo  . Atrial fibrillation     chronic anticoag  . Arthritis     ?rheumatoid, never dx or on DMARDs  . Osteoporosis     Review of Systems  Constitutional: Negative for fever and unexpected weight change.  Respiratory: Negative for cough and shortness of breath.   Cardiovascular: Negative for chest pain and leg swelling.  Gastrointestinal: Negative for nausea and vomiting.  Genitourinary: Negative for difficulty urinating.       Objective:   Physical Exam BP 100/62  Pulse 78  Temp(Src) 97.8 F (36.6 C) (Oral)  Wt 183 lb 12.8 oz (83.371 kg)  BMI 29.02 kg/m2  SpO2 95% Wt Readings from Last 3 Encounters:  08/04/13 183 lb 12.8 oz (83.371 kg)  07/06/13 187 lb 12.8 oz (85.186 kg)  05/17/13 182 lb (82.555 kg)   Constitutional: he appears well-developed and well-nourished. No distress. Using RW - Dtr at side Neck: Normal range of motion. Neck supple. No JVD present. No thyromegaly present.  Cardiovascular: Normal rate, regular rhythm and normal heart sounds.  No murmur heard. No BLE edema. Pulmonary/Chest: Effort normal and breath sounds normal. No respiratory distress. he has no wheezes.  Skin: Skin is warm and dry. No rash noted. No erythema.  Musculoskeletal: chronic arthritic changes right ankle, but supported with brace and  support hose Psychiatric: he has a normal mood and affect. behavior is normal. Judgment and thought content normal.   Lab Results  Component Value Date   WBC 6.9 04/28/2013   HGB 13.6 04/28/2013   HCT 40.2 04/28/2013   PLT 194.0 04/28/2013   GLUCOSE 103* 04/28/2013   CHOL 192 02/03/2013   TRIG 212.0* 02/03/2013   HDL 37.50* 02/03/2013   LDLDIRECT 112.5 02/03/2013   LDLCALC 107* 03/04/2010   ALT 23 02/03/2013   AST 24 02/03/2013   NA 139 04/28/2013   K 4.2 04/28/2013   CL 105 04/28/2013   CREATININE 1.1 04/28/2013   BUN 23 04/28/2013   CO2 26 04/28/2013   TSH 3.95 11/11/2012   INR 2.4 07/05/2013       Assessment & Plan:   See problem list. Medications and labs reviewed today.  Time spent with pt/family today 25 minutes, greater than 50% time spent counseling patient on arthritis symptoms, reflux, heart failure and medication review. Also review of prior records

## 2013-08-04 NOTE — Assessment & Plan Note (Signed)
Chronic anticoag - rate controlled and asymptomatic  The current medical regimen is effective;  continue present plan and medications.  

## 2013-08-04 NOTE — Assessment & Plan Note (Signed)
Chronic symptoms, also diffuse DJD changes No acute effusions or injury Recommend scheduled Tylenol in addition to ongoing medications Patient to call if symptoms worse or unimproved

## 2013-08-04 NOTE — Assessment & Plan Note (Signed)
Changed PPI to H2B 11/2012 due to change in bowels  BM normalized but nocturnal GERD symptoms 1-2/week Declines increase H2B to max dose, prefers prn TUMS (which resolve symptoms) Pt will call if unimproved in next 30d, sooner if worse

## 2013-08-16 ENCOUNTER — Ambulatory Visit (INDEPENDENT_AMBULATORY_CARE_PROVIDER_SITE_OTHER): Payer: Medicare Other | Admitting: General Practice

## 2013-08-16 DIAGNOSIS — I4891 Unspecified atrial fibrillation: Secondary | ICD-10-CM | POA: Diagnosis not present

## 2013-08-29 ENCOUNTER — Other Ambulatory Visit: Payer: Self-pay | Admitting: Internal Medicine

## 2013-09-13 ENCOUNTER — Encounter (INDEPENDENT_AMBULATORY_CARE_PROVIDER_SITE_OTHER): Payer: Medicare Other

## 2013-09-13 DIAGNOSIS — I4891 Unspecified atrial fibrillation: Secondary | ICD-10-CM | POA: Diagnosis not present

## 2013-09-14 ENCOUNTER — Ambulatory Visit: Payer: Self-pay | Admitting: Podiatrist

## 2013-09-20 DIAGNOSIS — H6120 Impacted cerumen, unspecified ear: Secondary | ICD-10-CM | POA: Diagnosis not present

## 2013-09-23 ENCOUNTER — Ambulatory Visit (INDEPENDENT_AMBULATORY_CARE_PROVIDER_SITE_OTHER): Payer: Medicare Other | Admitting: General Practice

## 2013-09-23 DIAGNOSIS — I4891 Unspecified atrial fibrillation: Secondary | ICD-10-CM | POA: Diagnosis not present

## 2013-09-23 NOTE — Progress Notes (Signed)
Pre-visit discussion using our clinic review tool. No additional management support is needed unless otherwise documented below in the visit note.  

## 2013-09-27 ENCOUNTER — Ambulatory Visit (INDEPENDENT_AMBULATORY_CARE_PROVIDER_SITE_OTHER): Payer: Medicare Other | Admitting: Internal Medicine

## 2013-09-27 ENCOUNTER — Encounter: Payer: Self-pay | Admitting: Internal Medicine

## 2013-09-27 VITALS — BP 108/68 | HR 61 | Temp 96.0°F | Wt 176.0 lb

## 2013-09-27 DIAGNOSIS — J398 Other specified diseases of upper respiratory tract: Secondary | ICD-10-CM

## 2013-09-27 DIAGNOSIS — J399 Disease of upper respiratory tract, unspecified: Secondary | ICD-10-CM

## 2013-09-27 MED ORDER — AZITHROMYCIN 250 MG PO TABS: ORAL_TABLET | ORAL | Status: DC

## 2013-09-27 NOTE — Patient Instructions (Signed)
Upper respiratory infection - lungs are clear. Given age and symptoms will treat with a short course of antibiotics -   Plan  azithromycin (Z-pak),   robitussin DM 1 tsp every 4 hours as needed,   tylenol as needed for any signs of fever,   Hydrate.  Call if your symptoms do not clear

## 2013-09-27 NOTE — Progress Notes (Signed)
Subjective:    Patient ID: [name], male    DOB: 08-30-1917, 79 y.o.   MRN: [id number]  HPI [name] presents for a week long h/o a dry cough that awakens him from sleep - sputum is thick yellowish green. No fever, feels good, still gets out for coffee. NO increased SOB.   Past Medical History  Diagnosis Date  . Paroxysmal atrial fibrillation   . CAD (coronary artery disease)     CABG 08/1973  . CHF (congestive heart failure)     ICM  . Pacemaker 09/2003  . Hypertension   . Hyperlipidemia   . Unsteady gait     due to B ankle DJD and instability  . Memory loss   . Chronic renal insufficiency   . Barrett's esophagus   . Diverticulosis of colon   . GERD (gastroesophageal reflux disease)   . Adenocarcinoma of prostate 10/2005    lupron injections q59mo  . Atrial fibrillation     chronic anticoag  . Arthritis     ?rheumatoid, never dx or on DMARDs  . Osteoporosis    Past Surgical History  Procedure Laterality Date  . Pacemaker placement  09/2003  . Hernia repair      age 79 ( not sure if it was right or left side)  . Coronary artery bypass graft  08/1973    3 heart heart arteries were 80 percent clogged. the lower valve of my heart is not functioning  . Doppler echocardiography  2004, 2008   History reviewed. No pertinent family history. History   Social History  . Marital Status: Married    Spouse Name: N/A    Number of Children: N/A  . Years of Education: N/A   Occupational History  . Not on file.   Social History Main Topics  . Smoking status: Former Smoker    Quit date: 08/05/1986  . Smokeless tobacco: Not on file  . Alcohol Use: Yes     Comment: samll glass of wine  . Drug Use: Not on file  . Sexual Activity: Not on file   Other Topics Concern  . Not on file   Social History Narrative   Married  - lives with spouse and supportive daugher   Former Smoker -  quit tobacco 25 years ago     Alcohol use-yes (small glass of wine)                   Current Outpatient Prescriptions on File Prior to Visit  Medication Sig Dispense Refill  . acetaminophen (TYLENOL) 500 MG tablet Take 1 tablet (500 mg total) by mouth every 6 (six) hours as needed for pain or fever.  30 tablet  0  . alendronate (FOSAMAX) 70 MG tablet Take 70 mg by mouth every 7 (seven) days. Take with a full glass of water on an empty stomach.       Marland Kitchen aspirin EC 325 MG tablet Take 1 tablet (325 mg total) by mouth daily.  100 tablet  3  . benazepril (LOTENSIN) 5 MG tablet TAKE 1/2 TABLET BY MOUTH ONCE A DAY  45 tablet  1  . furosemide (LASIX) 40 MG tablet Take 1 tablet (40 mg total) by mouth daily.  30 tablet    . Misc Natural Products (LUTEIN 20 PO) Take by mouth daily.      . nitroGLYCERIN (NITROSTAT) 0.4 MG SL tablet Place 1 tablet (0.4 mg total) under the tongue every 5 (five) minutes as needed.  25 tablet  1  . potassium chloride (K-DUR,KLOR-CON) 10 MEQ tablet TAKE 1 TABLET BY MOUTH DAILY  30 tablet  5  . pravastatin (PRAVACHOL) 10 MG tablet TAKE ONE TABLET BY MOUTH EVERY EVENING  90 tablet  1  . ranitidine (ZANTAC) 150 MG tablet TAKE 1 TABLET (150 MG TOTAL) BY MOUTH AT BEDTIME.  30 tablet  5  . tiZANidine (ZANAFLEX) 4 MG tablet Take 1 tablet (4 mg total) by mouth every 6 (six) hours as needed (back spasm/pain).  30 tablet  0  . vitamin C (ASCORBIC ACID) 500 MG tablet Take 1 tablet (500 mg total) by mouth daily.      Marland Kitchen warfarin (COUMADIN) 1 MG tablet TAKE 1 TABLET (1 MG TOTAL) BY MOUTH AS DIRECTED. TAKE AS DIRECTED BY COUMADIN CLINIC.  45 tablet  2  . [DISCONTINUED] potassium chloride (KLOR-CON) 10 MEQ CR tablet Take 1 tablet (10 mEq total) by mouth daily.  30 tablet  3   No current facility-administered medications on file prior to visit.      Review of Systems System review is negative for any constitutional, cardiac, pulmonary, GI or neuro symptoms or complaints other than as described in the HPI.     Objective:   Physical Exam Filed Vitals:   09/27/13 1651   BP: 108/68  Pulse: 61  Temp: 96 F (35.6 C)   Wt Readings from Last 3 Encounters:  09/27/13 176 lb (79.833 kg)  08/04/13 183 lb 12.8 oz (83.371 kg)  07/06/13 187 lb 12.8 oz (85.186 kg)   Gen'l - healthy looking 79 y/o in no distress HEENT- hearing aids in place Cor- RRR, II.VI systolic mm best RSB Pulm - no increased WOB, no rales, no wheezes.  Neuro - alert and oriented.       Assessment & Plan:  Upper respiratory infection - lungs are clear. Given age and symptoms will treat with a short course of antibiotics -   Plan  azithromycin (Z-pak),   robitussin DM 1 tsp every 4 hours as needed,   tylenol as needed for any signs of fever,   Hydrate.  Call if your symptoms do not clear

## 2013-09-27 NOTE — Progress Notes (Signed)
Pre visit review using our clinic review tool, if applicable. No additional management support is needed unless otherwise documented below in the visit note. 

## 2013-10-10 ENCOUNTER — Other Ambulatory Visit: Payer: Self-pay | Admitting: Internal Medicine

## 2013-10-19 ENCOUNTER — Encounter: Payer: Self-pay | Admitting: Podiatrist

## 2013-10-19 ENCOUNTER — Ambulatory Visit (INDEPENDENT_AMBULATORY_CARE_PROVIDER_SITE_OTHER): Payer: Medicare Other | Admitting: Podiatrist

## 2013-10-19 VITALS — BP 108/48 | HR 90 | Resp 16

## 2013-10-19 DIAGNOSIS — M79609 Pain in unspecified limb: Secondary | ICD-10-CM

## 2013-10-19 DIAGNOSIS — B351 Tinea unguium: Secondary | ICD-10-CM | POA: Diagnosis not present

## 2013-10-19 NOTE — Progress Notes (Signed)
HPI:  Patient presents today for follow up of foot and nail care. Denies any new complaints today.  Objective:  Patients chart is reviewed.  Neurovascular status unchanged.  Patients nails are thickened, discolored, distrophic, friable and brittle with yellow-brown discoloration. Patient subjectively relates they are painful with shoes and with ambulation of bilateral feet.  Assessment:  Symptomatic onychomycosis  Plan:  Discussed treatment options and alternatives.  The symptomatic toenails were debrided through manual an mechanical means without complication.  Return appointment recommended at routine intervals of 3 months    Fatim Vanderschaaf, DPM   

## 2013-10-21 ENCOUNTER — Ambulatory Visit (INDEPENDENT_AMBULATORY_CARE_PROVIDER_SITE_OTHER): Payer: Medicare Other | Admitting: General Practice

## 2013-10-21 DIAGNOSIS — I4891 Unspecified atrial fibrillation: Secondary | ICD-10-CM

## 2013-10-21 LAB — POCT INR: INR: 2.5

## 2013-10-21 NOTE — Progress Notes (Signed)
Pre-visit discussion using our clinic review tool. No additional management support is needed unless otherwise documented below in the visit note.  

## 2013-11-07 ENCOUNTER — Encounter: Payer: Self-pay | Admitting: *Deleted

## 2013-11-07 ENCOUNTER — Telehealth: Payer: Self-pay | Admitting: *Deleted

## 2013-11-07 NOTE — Telephone Encounter (Signed)
Pt sent letter to md requesting her to write a letter to [facility] for a wheel walker due to left dront wheel is broken. MD states ok to generate letter. Notified pt letter is ready for pick-up.../l,b

## 2013-11-09 ENCOUNTER — Other Ambulatory Visit: Payer: Self-pay | Admitting: Internal Medicine

## 2013-11-11 ENCOUNTER — Other Ambulatory Visit: Payer: Self-pay | Admitting: General Practice

## 2013-11-11 MED ORDER — WARFARIN SODIUM 1 MG PO TABS: ORAL_TABLET | ORAL | Status: DC

## 2013-11-16 ENCOUNTER — Ambulatory Visit (INDEPENDENT_AMBULATORY_CARE_PROVIDER_SITE_OTHER): Payer: Medicare Other | Admitting: General Practice

## 2013-11-16 DIAGNOSIS — I4891 Unspecified atrial fibrillation: Secondary | ICD-10-CM

## 2013-11-16 LAB — POCT INR: INR: 1.9

## 2013-11-16 NOTE — Progress Notes (Signed)
Pre-visit discussion using our clinic review tool. No additional management support is needed unless otherwise documented below in the visit note.  

## 2013-11-17 DIAGNOSIS — H6120 Impacted cerumen, unspecified ear: Secondary | ICD-10-CM | POA: Diagnosis not present

## 2013-11-24 ENCOUNTER — Other Ambulatory Visit: Payer: Self-pay | Admitting: Internal Medicine

## 2013-12-01 DIAGNOSIS — C61 Malignant neoplasm of prostate: Secondary | ICD-10-CM | POA: Diagnosis not present

## 2013-12-14 ENCOUNTER — Ambulatory Visit (INDEPENDENT_AMBULATORY_CARE_PROVIDER_SITE_OTHER): Payer: Medicare Other | Admitting: General Practice

## 2013-12-14 DIAGNOSIS — I4891 Unspecified atrial fibrillation: Secondary | ICD-10-CM | POA: Diagnosis not present

## 2013-12-14 DIAGNOSIS — Z7189 Other specified counseling: Secondary | ICD-10-CM | POA: Insufficient documentation

## 2013-12-14 DIAGNOSIS — Z5181 Encounter for therapeutic drug level monitoring: Secondary | ICD-10-CM | POA: Diagnosis not present

## 2013-12-14 LAB — POCT INR: INR: 1.7

## 2013-12-14 NOTE — Progress Notes (Signed)
Pre-visit discussion using our clinic review tool. No additional management support is needed unless otherwise documented below in the visit note.  

## 2013-12-20 ENCOUNTER — Ambulatory Visit (INDEPENDENT_AMBULATORY_CARE_PROVIDER_SITE_OTHER): Payer: Medicare Other | Admitting: Internal Medicine

## 2013-12-20 ENCOUNTER — Encounter: Payer: Self-pay | Admitting: Internal Medicine

## 2013-12-20 VITALS — BP 110/60 | HR 85 | Temp 97.7°F | Wt 180.8 lb

## 2013-12-20 DIAGNOSIS — J069 Acute upper respiratory infection, unspecified: Secondary | ICD-10-CM | POA: Diagnosis not present

## 2013-12-20 DIAGNOSIS — I509 Heart failure, unspecified: Secondary | ICD-10-CM

## 2013-12-20 DIAGNOSIS — I4891 Unspecified atrial fibrillation: Secondary | ICD-10-CM

## 2013-12-20 DIAGNOSIS — I5042 Chronic combined systolic (congestive) and diastolic (congestive) heart failure: Secondary | ICD-10-CM

## 2013-12-20 DIAGNOSIS — B9789 Other viral agents as the cause of diseases classified elsewhere: Principal | ICD-10-CM

## 2013-12-20 MED ORDER — BENZONATATE 100 MG PO CAPS: 100.0000 mg | ORAL_CAPSULE | Freq: Three times a day (TID) | ORAL | Status: DC | PRN

## 2013-12-20 MED ORDER — DOXYCYCLINE HYCLATE 100 MG PO TABS: 100.0000 mg | ORAL_TABLET | Freq: Two times a day (BID) | ORAL | Status: DC

## 2013-12-20 NOTE — Patient Instructions (Addendum)
It was good to see you today.  Doxycyline antibiotics 2x/day x 1 week and prescription cough "perles"-  All antibiotics can interact with warfarin and raised the levels of your INR. Please call your Coumadin clinic if any bleeding, bruising or other problems arise while taking antibiotics  Your prescription(s) have been submitted to your pharmacy. Please take as directed and contact our office if you believe you are having problem(s) with the medication(s).  use tylenol for aches, pain and fever symptoms as discussed; Okay to continue Delsym for cough as needed  Hydrate, rest and call if worse or unimproved  Upper Respiratory Infection, Adult An upper respiratory infection (URI) is also sometimes known as the common cold. The upper respiratory tract includes the nose, sinuses, throat, trachea, and bronchi. Bronchi are the airways leading to the lungs. Most people improve within 1 week, but symptoms can last up to 2 weeks. A residual cough may last even longer.  CAUSES Many different viruses can infect the tissues lining the upper respiratory tract. The tissues become irritated and inflamed and often become very moist. Mucus production is also common. A cold is contagious. You can easily spread the virus to others by oral contact. This includes kissing, sharing a glass, coughing, or sneezing. Touching your mouth or nose and then touching a surface, which is then touched by another person, can also spread the virus. SYMPTOMS  Symptoms typically develop 1 to 3 days after you come in contact with a cold virus. Symptoms vary from person to person. They may include:  Runny nose.  Sneezing.  Nasal congestion.  Sinus irritation.  Sore throat.  Loss of voice (laryngitis).  Cough.  Fatigue.  Muscle aches.  Loss of appetite.  Headache.  Low-grade fever. DIAGNOSIS  You might diagnose your own cold based on familiar symptoms, since most people get a cold 2 to 3 times a year. Your  caregiver can confirm this based on your exam. Most importantly, your caregiver can check that your symptoms are not due to another disease such as strep throat, sinusitis, pneumonia, asthma, or epiglottitis. Blood tests, throat tests, and X-rays are not necessary to diagnose a common cold, but they may sometimes be helpful in excluding other more serious diseases. Your caregiver will decide if any further tests are required. RISKS AND COMPLICATIONS  You may be at risk for a more severe case of the common cold if you smoke cigarettes, have chronic heart disease (such as heart failure) or lung disease (such as asthma), or if you have a weakened immune system. The very young and very old are also at risk for more serious infections. Bacterial sinusitis, middle ear infections, and bacterial pneumonia can complicate the common cold. The common cold can worsen asthma and chronic obstructive pulmonary disease (COPD). Sometimes, these complications can require emergency medical care and may be life-threatening. PREVENTION  The best way to protect against getting a cold is to practice good hygiene. Avoid oral or hand contact with people with cold symptoms. Wash your hands often if contact occurs. There is no clear evidence that vitamin C, vitamin E, echinacea, or exercise reduces the chance of developing a cold. However, it is always recommended to get plenty of rest and practice good nutrition. TREATMENT  Treatment is directed at relieving symptoms. There is no cure. Antibiotics are not effective, because the infection is caused by a virus, not by bacteria. Treatment may include:  Increased fluid intake. Sports drinks offer valuable electrolytes, sugars, and fluids.  Breathing  heated mist or steam (vaporizer or shower).  Eating chicken soup or other clear broths, and maintaining good nutrition.  Getting plenty of rest.  Using gargles or lozenges for comfort.  Controlling fevers with ibuprofen or  acetaminophen as directed by your caregiver.  Increasing usage of your inhaler if you have asthma. Zinc gel and zinc lozenges, taken in the first 24 hours of the common cold, can shorten the duration and lessen the severity of symptoms. Pain medicines may help with fever, muscle aches, and throat pain. A variety of non-prescription medicines are available to treat congestion and runny nose. Your caregiver can make recommendations and may suggest nasal or lung inhalers for other symptoms.  HOME CARE INSTRUCTIONS   Only take over-the-counter or prescription medicines for pain, discomfort, or fever as directed by your caregiver.  Use a warm mist humidifier or inhale steam from a shower to increase air moisture. This may keep secretions moist and make it easier to breathe.  Drink enough water and fluids to keep your urine clear or pale yellow.  Rest as needed.  Return to work when your temperature has returned to normal or as your caregiver advises. You may need to stay home longer to avoid infecting others. You can also use a face mask and careful hand washing to prevent spread of the virus. SEEK MEDICAL CARE IF:   After the first few days, you feel you are getting worse rather than better.  You need your caregiver's advice about medicines to control symptoms.  You develop chills, worsening shortness of breath, or brown or red sputum. These may be signs of pneumonia.  You develop yellow or brown nasal discharge or pain in the face, especially when you bend forward. These may be signs of sinusitis.  You develop a fever, swollen neck glands, pain with swallowing, or white areas in the back of your throat. These may be signs of strep throat. SEEK IMMEDIATE MEDICAL CARE IF:   You have a fever.  You develop severe or persistent headache, ear pain, sinus pain, or chest pain.  You develop wheezing, a prolonged cough, cough up blood, or have a change in your usual mucus (if you have chronic lung  disease).  You develop sore muscles or a stiff neck. Document Released: 04/22/2001 Document Revised: 01/19/2012 Document Reviewed: 02/28/2011 [facility] Patient Information 2014 [place], [place].

## 2013-12-20 NOTE — Progress Notes (Signed)
Pre-visit discussion using our clinic review tool. No additional management support is needed unless otherwise documented below in the visit note.  

## 2013-12-20 NOTE — Assessment & Plan Note (Signed)
Chronic ischemic CM without exacerbation symptoms stable, euvolemic on exam Follows with cardiology annually, no indication for a followup echocardiogram at this time given asymptomatic dz and advanced age The current medical regimen is effective;  continue present plan and medications.

## 2013-12-20 NOTE — Progress Notes (Signed)
Subjective:    Patient ID: [name], male    DOB: 1917/06/20, 80 y.o.   MRN: [id number]  Cough This is a new problem. The current episode started in the past 7 days. The problem has been gradually worsening. The problem occurs every few minutes. The cough is productive of sputum. Associated symptoms include chills, nasal congestion, postnasal drip and rhinorrhea. Pertinent negatives include no chest pain, fever, headaches, myalgias, rash, sore throat, shortness of breath, weight loss or wheezing. Nothing aggravates the symptoms. The treatment provided mild relief.     Past Medical History  Diagnosis Date  . Paroxysmal atrial fibrillation   . CAD (coronary artery disease)     CABG 08/1973  . CHF (congestive heart failure)     ICM  . Pacemaker 09/2003  . Hypertension   . Hyperlipidemia   . Unsteady gait     due to B ankle DJD and instability  . Memory loss   . Chronic renal insufficiency   . Barrett's esophagus   . Diverticulosis of colon   . GERD (gastroesophageal reflux disease)   . Adenocarcinoma of prostate 10/2005    lupron injections q9mo  . Atrial fibrillation     chronic anticoag  . Arthritis     ?rheumatoid, never dx or on DMARDs  . Osteoporosis     Review of Systems  Constitutional: Positive for chills. Negative for fever and weight loss.  HENT: Positive for postnasal drip and rhinorrhea. Negative for sore throat.   Respiratory: Positive for cough. Negative for shortness of breath and wheezing.   Cardiovascular: Negative for chest pain.  Musculoskeletal: Negative for myalgias.  Skin: Negative for rash.  Neurological: Negative for headaches.       Objective:   Physical Exam  BP 110/60  Pulse 85  Temp(Src) 97.7 F (36.5 C) (Oral)  Wt 180 lb 12.8 oz (82.01 kg)  SpO2 95% Wt Readings from Last 3 Encounters:  12/20/13 180 lb 12.8 oz (82.01 kg)  09/27/13 176 lb (79.833 kg)  08/04/13 183 lb 12.8 oz (83.371 kg)    Constitutional: he appears  well-developed and well-nourished. No distress. Dtr at side HENT: NCAT, TMs clear B, OP mild erythema, +PND but no exudate Neck: Normal range of motion. Neck supple. No LAD or JVD present. No thyromegaly present.  Cardiovascular: Normal rate, irregular rhythm and normal heart sounds.  No murmur heard. No BLE edema. Pulmonary/Chest: Effort normal and breath sounds normal. No respiratory distress. he has no wheezes.  Psychiatric: he has a normal mood and affect. His behavior is normal. Judgment and thought content normal.   Lab Results  Component Value Date   WBC 6.9 04/28/2013   HGB 13.6 04/28/2013   HCT 40.2 04/28/2013   PLT 194.0 04/28/2013   GLUCOSE 103* 04/28/2013   CHOL 192 02/03/2013   TRIG 212.0* 02/03/2013   HDL 37.50* 02/03/2013   LDLDIRECT 112.5 02/03/2013   LDLCALC 107* 03/04/2010   ALT 23 02/03/2013   AST 24 02/03/2013   NA 139 04/28/2013   K 4.2 04/28/2013   CL 105 04/28/2013   CREATININE 1.1 04/28/2013   BUN 23 04/28/2013   CO2 26 04/28/2013   TSH 3.95 11/11/2012   INR 1.7 12/14/2013    No results found.     Assessment & Plan:   Dry cough, afebrile but concerning for infectious etiology given sick contacts (wife in hospital with PNA); high risk complications given advance age and other co morbidities  Empiric doxycycline antibiotics -  we reviewed potential risk/benefit of antibiotics co-administration with warfarin reviewed and feel the potential risk of untreated infection is outweighed by potential benefit of antibiotic administration. Patient will follow with anticoagulation clinic as ongoing and call if problems  Cough suppressant with Delsym over-the-counter as ongoing, add Tessalon 3 times a day when necessary  No evidence at this time for pneumonia, the patient and family agree to call symptoms worse or unimproved for further evaluation treatment as needed  Problem List Items Addressed This Visit   ATRIAL FIBRILLATION, PAROXYSMAL - Primary     Chronic anticoag - rate  controlled and asymptomatic  The current medical regimen is effective;  continue present plan and medications.     Chronic combined systolic and diastolic congestive heart failure     Chronic ischemic CM without exacerbation symptoms stable, euvolemic on exam Follows with cardiology annually, no indication for a followup echocardiogram at this time given asymptomatic dz and advanced age The current medical regimen is effective;  continue present plan and medications.

## 2013-12-20 NOTE — Assessment & Plan Note (Signed)
Chronic anticoag - rate controlled and asymptomatic  The current medical regimen is effective;  continue present plan and medications.

## 2013-12-30 ENCOUNTER — Other Ambulatory Visit: Payer: Self-pay | Admitting: Internal Medicine

## 2014-01-11 ENCOUNTER — Ambulatory Visit (INDEPENDENT_AMBULATORY_CARE_PROVIDER_SITE_OTHER): Payer: Medicare Other | Admitting: Family Medicine

## 2014-01-11 DIAGNOSIS — Z5181 Encounter for therapeutic drug level monitoring: Secondary | ICD-10-CM

## 2014-01-11 DIAGNOSIS — I4891 Unspecified atrial fibrillation: Secondary | ICD-10-CM | POA: Diagnosis not present

## 2014-01-11 LAB — POCT INR: INR: 1.8

## 2014-01-16 ENCOUNTER — Telehealth: Payer: Self-pay | Admitting: Internal Medicine

## 2014-01-16 DIAGNOSIS — R627 Adult failure to thrive: Secondary | ICD-10-CM

## 2014-01-16 NOTE — Telephone Encounter (Signed)
Wife ad dtr in for wife's office visit today. Requests home health referral for Mr. Bissonnette (spouse) Order done

## 2014-01-18 ENCOUNTER — Ambulatory Visit: Payer: Medicare Other | Admitting: Podiatrist

## 2014-01-18 ENCOUNTER — Encounter: Payer: Self-pay | Admitting: Podiatrist

## 2014-01-18 ENCOUNTER — Ambulatory Visit (INDEPENDENT_AMBULATORY_CARE_PROVIDER_SITE_OTHER): Payer: Medicare Other | Admitting: Podiatrist

## 2014-01-18 VITALS — BP 140/74 | HR 84 | Resp 16

## 2014-01-18 DIAGNOSIS — B351 Tinea unguium: Secondary | ICD-10-CM | POA: Diagnosis not present

## 2014-01-18 DIAGNOSIS — M79609 Pain in unspecified limb: Secondary | ICD-10-CM | POA: Diagnosis not present

## 2014-01-18 NOTE — Progress Notes (Signed)
HPI:  Patient presents today for follow up of foot and nail care. Denies any new complaints today.  Objective:  Patients chart is reviewed.  Vascular status reveals pedal pulses noted at 2 out of 4 dp and pt bilateral .  Neurological sensation is Normal to Semmes Weinstein monofilament bilateral.  Patients nails are thickened, discolored, distrophic, friable and brittle with yellow-brown discoloration. Patient subjectively relates they are painful with shoes and with ambulation of bilateral feet.  Assessment:  Symptomatic onychomycosis  Plan:  Discussed treatment options and alternatives.  The symptomatic toenails were debrided through manual an mechanical means without complication.  Return appointment recommended at routine intervals of 3 months    Akshar Starnes, DPM   

## 2014-01-21 ENCOUNTER — Other Ambulatory Visit: Payer: Self-pay | Admitting: Internal Medicine

## 2014-01-21 DIAGNOSIS — Z5189 Encounter for other specified aftercare: Secondary | ICD-10-CM | POA: Diagnosis not present

## 2014-01-21 DIAGNOSIS — I5043 Acute on chronic combined systolic (congestive) and diastolic (congestive) heart failure: Secondary | ICD-10-CM | POA: Diagnosis not present

## 2014-01-21 DIAGNOSIS — Z7901 Long term (current) use of anticoagulants: Secondary | ICD-10-CM | POA: Diagnosis not present

## 2014-01-21 DIAGNOSIS — I509 Heart failure, unspecified: Secondary | ICD-10-CM | POA: Diagnosis not present

## 2014-01-21 DIAGNOSIS — I4891 Unspecified atrial fibrillation: Secondary | ICD-10-CM | POA: Diagnosis not present

## 2014-01-21 DIAGNOSIS — Z950 Presence of cardiac pacemaker: Secondary | ICD-10-CM | POA: Diagnosis not present

## 2014-01-21 DIAGNOSIS — R627 Adult failure to thrive: Secondary | ICD-10-CM | POA: Diagnosis not present

## 2014-01-23 ENCOUNTER — Other Ambulatory Visit: Payer: Self-pay | Admitting: General Practice

## 2014-01-23 MED ORDER — WARFARIN SODIUM 1 MG PO TABS: ORAL_TABLET | ORAL | Status: DC

## 2014-01-24 DIAGNOSIS — I509 Heart failure, unspecified: Secondary | ICD-10-CM | POA: Diagnosis not present

## 2014-01-24 DIAGNOSIS — Z7901 Long term (current) use of anticoagulants: Secondary | ICD-10-CM | POA: Diagnosis not present

## 2014-01-24 DIAGNOSIS — R627 Adult failure to thrive: Secondary | ICD-10-CM | POA: Diagnosis not present

## 2014-01-24 DIAGNOSIS — Z5189 Encounter for other specified aftercare: Secondary | ICD-10-CM | POA: Diagnosis not present

## 2014-01-24 DIAGNOSIS — I4891 Unspecified atrial fibrillation: Secondary | ICD-10-CM | POA: Diagnosis not present

## 2014-01-24 DIAGNOSIS — I5043 Acute on chronic combined systolic (congestive) and diastolic (congestive) heart failure: Secondary | ICD-10-CM | POA: Diagnosis not present

## 2014-01-25 DIAGNOSIS — R627 Adult failure to thrive: Secondary | ICD-10-CM | POA: Diagnosis not present

## 2014-01-25 DIAGNOSIS — Z7901 Long term (current) use of anticoagulants: Secondary | ICD-10-CM | POA: Diagnosis not present

## 2014-01-25 DIAGNOSIS — I5043 Acute on chronic combined systolic (congestive) and diastolic (congestive) heart failure: Secondary | ICD-10-CM | POA: Diagnosis not present

## 2014-01-25 DIAGNOSIS — I4891 Unspecified atrial fibrillation: Secondary | ICD-10-CM | POA: Diagnosis not present

## 2014-01-25 DIAGNOSIS — I509 Heart failure, unspecified: Secondary | ICD-10-CM | POA: Diagnosis not present

## 2014-01-25 DIAGNOSIS — Z5189 Encounter for other specified aftercare: Secondary | ICD-10-CM | POA: Diagnosis not present

## 2014-01-27 DIAGNOSIS — I5043 Acute on chronic combined systolic (congestive) and diastolic (congestive) heart failure: Secondary | ICD-10-CM | POA: Diagnosis not present

## 2014-01-27 DIAGNOSIS — I509 Heart failure, unspecified: Secondary | ICD-10-CM | POA: Diagnosis not present

## 2014-01-27 DIAGNOSIS — R627 Adult failure to thrive: Secondary | ICD-10-CM | POA: Diagnosis not present

## 2014-01-27 DIAGNOSIS — Z7901 Long term (current) use of anticoagulants: Secondary | ICD-10-CM | POA: Diagnosis not present

## 2014-01-27 DIAGNOSIS — Z5189 Encounter for other specified aftercare: Secondary | ICD-10-CM | POA: Diagnosis not present

## 2014-01-27 DIAGNOSIS — I4891 Unspecified atrial fibrillation: Secondary | ICD-10-CM | POA: Diagnosis not present

## 2014-01-30 DIAGNOSIS — I4891 Unspecified atrial fibrillation: Secondary | ICD-10-CM

## 2014-01-30 DIAGNOSIS — R627 Adult failure to thrive: Secondary | ICD-10-CM | POA: Diagnosis not present

## 2014-01-30 DIAGNOSIS — Z7901 Long term (current) use of anticoagulants: Secondary | ICD-10-CM | POA: Diagnosis not present

## 2014-01-30 DIAGNOSIS — I509 Heart failure, unspecified: Secondary | ICD-10-CM

## 2014-01-30 DIAGNOSIS — Z5189 Encounter for other specified aftercare: Secondary | ICD-10-CM

## 2014-01-30 DIAGNOSIS — I5043 Acute on chronic combined systolic (congestive) and diastolic (congestive) heart failure: Secondary | ICD-10-CM

## 2014-01-31 DIAGNOSIS — I5043 Acute on chronic combined systolic (congestive) and diastolic (congestive) heart failure: Secondary | ICD-10-CM | POA: Diagnosis not present

## 2014-01-31 DIAGNOSIS — I509 Heart failure, unspecified: Secondary | ICD-10-CM | POA: Diagnosis not present

## 2014-01-31 DIAGNOSIS — R627 Adult failure to thrive: Secondary | ICD-10-CM | POA: Diagnosis not present

## 2014-01-31 DIAGNOSIS — I4891 Unspecified atrial fibrillation: Secondary | ICD-10-CM | POA: Diagnosis not present

## 2014-01-31 DIAGNOSIS — Z5189 Encounter for other specified aftercare: Secondary | ICD-10-CM | POA: Diagnosis not present

## 2014-01-31 DIAGNOSIS — Z7901 Long term (current) use of anticoagulants: Secondary | ICD-10-CM | POA: Diagnosis not present

## 2014-02-01 ENCOUNTER — Ambulatory Visit (INDEPENDENT_AMBULATORY_CARE_PROVIDER_SITE_OTHER): Payer: Medicare Other | Admitting: General Practice

## 2014-02-01 DIAGNOSIS — Z5181 Encounter for therapeutic drug level monitoring: Secondary | ICD-10-CM | POA: Diagnosis not present

## 2014-02-01 DIAGNOSIS — Z5189 Encounter for other specified aftercare: Secondary | ICD-10-CM | POA: Diagnosis not present

## 2014-02-01 DIAGNOSIS — Z7901 Long term (current) use of anticoagulants: Secondary | ICD-10-CM | POA: Diagnosis not present

## 2014-02-01 DIAGNOSIS — I4891 Unspecified atrial fibrillation: Secondary | ICD-10-CM | POA: Diagnosis not present

## 2014-02-01 DIAGNOSIS — R627 Adult failure to thrive: Secondary | ICD-10-CM | POA: Diagnosis not present

## 2014-02-01 DIAGNOSIS — I5043 Acute on chronic combined systolic (congestive) and diastolic (congestive) heart failure: Secondary | ICD-10-CM | POA: Diagnosis not present

## 2014-02-01 DIAGNOSIS — I509 Heart failure, unspecified: Secondary | ICD-10-CM | POA: Diagnosis not present

## 2014-02-01 LAB — POCT INR: INR: 1.9

## 2014-02-01 NOTE — Progress Notes (Signed)
Pre visit review using our clinic review tool, if applicable. No additional management support is needed unless otherwise documented below in the visit note. 

## 2014-02-02 ENCOUNTER — Encounter: Payer: Self-pay | Admitting: Internal Medicine

## 2014-02-02 ENCOUNTER — Ambulatory Visit (INDEPENDENT_AMBULATORY_CARE_PROVIDER_SITE_OTHER): Payer: Medicare Other | Admitting: Internal Medicine

## 2014-02-02 VITALS — BP 102/50 | HR 73 | Temp 97.5°F | Wt 182.1 lb

## 2014-02-02 DIAGNOSIS — I4891 Unspecified atrial fibrillation: Secondary | ICD-10-CM | POA: Diagnosis not present

## 2014-02-02 DIAGNOSIS — E785 Hyperlipidemia, unspecified: Secondary | ICD-10-CM | POA: Diagnosis not present

## 2014-02-02 DIAGNOSIS — I509 Heart failure, unspecified: Secondary | ICD-10-CM

## 2014-02-02 DIAGNOSIS — I10 Essential (primary) hypertension: Secondary | ICD-10-CM | POA: Diagnosis not present

## 2014-02-02 DIAGNOSIS — Z5189 Encounter for other specified aftercare: Secondary | ICD-10-CM | POA: Diagnosis not present

## 2014-02-02 DIAGNOSIS — I5042 Chronic combined systolic (congestive) and diastolic (congestive) heart failure: Secondary | ICD-10-CM

## 2014-02-02 DIAGNOSIS — Z7901 Long term (current) use of anticoagulants: Secondary | ICD-10-CM | POA: Diagnosis not present

## 2014-02-02 DIAGNOSIS — I5043 Acute on chronic combined systolic (congestive) and diastolic (congestive) heart failure: Secondary | ICD-10-CM | POA: Diagnosis not present

## 2014-02-02 DIAGNOSIS — R627 Adult failure to thrive: Secondary | ICD-10-CM | POA: Diagnosis not present

## 2014-02-02 NOTE — Progress Notes (Signed)
Subjective:    Patient ID: [name], male    DOB: 1917/02/11, 80 y.o.   MRN: [id number]  HPI  Patient here for follow up Reviewed chronic medical issues and interval medical events  Past Medical History  Diagnosis Date  . Paroxysmal atrial fibrillation   . CAD (coronary artery disease)     CABG 08/1973  . CHF (congestive heart failure)     ICM  . Pacemaker 09/2003  . Hypertension   . Hyperlipidemia   . Unsteady gait     due to B ankle DJD and instability  . Memory loss   . Chronic renal insufficiency   . Barrett's esophagus   . Diverticulosis of colon   . GERD (gastroesophageal reflux disease)   . Adenocarcinoma of prostate 10/2005    lupron injections q36mo  . Atrial fibrillation     chronic anticoag  . Arthritis     ?rheumatoid, never dx or on DMARDs  . Osteoporosis     Review of Systems  Constitutional: Negative for fever, fatigue and unexpected weight change.  Respiratory: Negative for cough and shortness of breath.   Cardiovascular: Negative for chest pain and leg swelling (chronic w/o change).       Objective:   Physical Exam  BP 102/50  Pulse 73  Temp(Src) 97.5 F (36.4 C) (Oral)  Wt 182 lb 1.9 oz (82.609 kg)  SpO2 94% Wt Readings from Last 3 Encounters:  02/02/14 182 lb 1.9 oz (82.609 kg)  12/20/13 180 lb 12.8 oz (82.01 kg)  09/27/13 176 lb (79.833 kg)    Constitutional: he is overweight, but appears well-developed and well-nourished. No distress. Dtr Magda Paganini at side Neck: Normal range of motion. Neck supple. No JVD present. No thyromegaly present.  Cardiovascular: Normal rate, irregular rhythm and normal heart sounds.  2/6 syst murmur heard. chronic soft 1+ distal BLE edema, wearing compression hose Pulmonary/Chest: Effort normal and breath sounds normal. No respiratory distress. he has no wheezes.  Psychiatric: he has a normal mood and affect. His behavior is normal. Judgment and thought content normal.   Lab Results  Component Value  Date   WBC 6.9 04/28/2013   HGB 13.6 04/28/2013   HCT 40.2 04/28/2013   PLT 194.0 04/28/2013   GLUCOSE 103* 04/28/2013   CHOL 192 02/03/2013   TRIG 212.0* 02/03/2013   HDL 37.50* 02/03/2013   LDLDIRECT 112.5 02/03/2013   LDLCALC 107* 03/04/2010   ALT 23 02/03/2013   AST 24 02/03/2013   NA 139 04/28/2013   K 4.2 04/28/2013   CL 105 04/28/2013   CREATININE 1.1 04/28/2013   BUN 23 04/28/2013   CO2 26 04/28/2013   TSH 3.95 11/11/2012   INR 1.9 02/01/2014    No results found.     Assessment & Plan:   Problem List Items Addressed This Visit   ATRIAL FIBRILLATION, PAROXYSMAL     Chronic anticoag - rate controlled and asymptomatic  Follows with anticoag clinic The current medical regimen is effective;  continue present plan and medications.    Chronic combined systolic and diastolic congestive heart failure     Chronic ischemic CM without exacerbation symptoms stable, euvolemic on exam Follows with cardiology annually, no indication for a followup echocardiogram at this time given asymptomatic dz and advanced age The current medical regimen is effective;  continue present plan and medications.     HYPERLIPIDEMIA - Primary     On statin, stable dose for years - secondary tx for CAD Check annual  labs and adjust prn    Relevant Orders      Lipid panel   HYPERTENSION      BP Readings from Last 3 Encounters:  02/02/14 102/50  01/18/14 140/74  12/20/13 110/60   The current medical regimen is effective;  continue present plan and medications.     Relevant Orders      Lipid panel

## 2014-02-02 NOTE — Progress Notes (Signed)
Pre visit review using our clinic review tool, if applicable. No additional management support is needed unless otherwise documented below in the visit note. 

## 2014-02-02 NOTE — Assessment & Plan Note (Signed)
On statin, stable dose for years - secondary tx for CAD Check annual labs and adjust prn

## 2014-02-02 NOTE — Patient Instructions (Signed)
It was good to see you today.  We have reviewed your prior records including labs and tests today  Test(s) ordered today. Your results will be released to [place] (or called to you) after review, usually within 72hours after test completion. If any changes need to be made, you will be notified at that same time.  Medications reviewed and updated, no changes recommended at this time.  Please schedule followup in 6 months for annual exam, call sooner if problems.

## 2014-02-02 NOTE — Assessment & Plan Note (Signed)
BP Readings from Last 3 Encounters:  02/02/14 102/50  01/18/14 140/74  12/20/13 110/60   The current medical regimen is effective;  continue present plan and medications.

## 2014-02-02 NOTE — Assessment & Plan Note (Signed)
Chronic anticoag - rate controlled and asymptomatic  Follows with anticoag clinic The current medical regimen is effective;  continue present plan and medications.

## 2014-02-02 NOTE — Assessment & Plan Note (Signed)
Chronic ischemic CM without exacerbation symptoms stable, euvolemic on exam Follows with cardiology annually, no indication for a followup echocardiogram at this time given asymptomatic dz and advanced age The current medical regimen is effective;  continue present plan and medications.
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
## 2014-02-03 ENCOUNTER — Telehealth: Payer: Self-pay | Admitting: Internal Medicine

## 2014-02-03 NOTE — Telephone Encounter (Signed)
Relevant patient education assigned to patient using Emmi. ° °

## 2014-02-06 ENCOUNTER — Other Ambulatory Visit (INDEPENDENT_AMBULATORY_CARE_PROVIDER_SITE_OTHER): Payer: Medicare Other

## 2014-02-06 DIAGNOSIS — Z5189 Encounter for other specified aftercare: Secondary | ICD-10-CM | POA: Diagnosis not present

## 2014-02-06 DIAGNOSIS — R627 Adult failure to thrive: Secondary | ICD-10-CM | POA: Diagnosis not present

## 2014-02-06 DIAGNOSIS — I10 Essential (primary) hypertension: Secondary | ICD-10-CM | POA: Diagnosis not present

## 2014-02-06 DIAGNOSIS — Z7901 Long term (current) use of anticoagulants: Secondary | ICD-10-CM | POA: Diagnosis not present

## 2014-02-06 DIAGNOSIS — I5043 Acute on chronic combined systolic (congestive) and diastolic (congestive) heart failure: Secondary | ICD-10-CM | POA: Diagnosis not present

## 2014-02-06 DIAGNOSIS — I509 Heart failure, unspecified: Secondary | ICD-10-CM | POA: Diagnosis not present

## 2014-02-06 DIAGNOSIS — E785 Hyperlipidemia, unspecified: Secondary | ICD-10-CM

## 2014-02-06 DIAGNOSIS — I4891 Unspecified atrial fibrillation: Secondary | ICD-10-CM | POA: Diagnosis not present

## 2014-02-06 LAB — LIPID PANEL
CHOLESTEROL: 199 mg/dL (ref 0–200)
HDL: 45.9 mg/dL (ref >39.00)
LDL Cholesterol: 126 mg/dL — ABNORMAL HIGH (ref 0–99)
Total CHOL/HDL Ratio: 4
Triglycerides: 135 mg/dL (ref 0.0–149.0)
VLDL: 27 mg/dL (ref 0.0–40.0)

## 2014-02-08 DIAGNOSIS — I509 Heart failure, unspecified: Secondary | ICD-10-CM | POA: Diagnosis not present

## 2014-02-08 DIAGNOSIS — I4891 Unspecified atrial fibrillation: Secondary | ICD-10-CM | POA: Diagnosis not present

## 2014-02-08 DIAGNOSIS — R627 Adult failure to thrive: Secondary | ICD-10-CM | POA: Diagnosis not present

## 2014-02-08 DIAGNOSIS — Z5189 Encounter for other specified aftercare: Secondary | ICD-10-CM | POA: Diagnosis not present

## 2014-02-08 DIAGNOSIS — Z7901 Long term (current) use of anticoagulants: Secondary | ICD-10-CM | POA: Diagnosis not present

## 2014-02-08 DIAGNOSIS — I5043 Acute on chronic combined systolic (congestive) and diastolic (congestive) heart failure: Secondary | ICD-10-CM | POA: Diagnosis not present

## 2014-02-09 DIAGNOSIS — I5043 Acute on chronic combined systolic (congestive) and diastolic (congestive) heart failure: Secondary | ICD-10-CM | POA: Diagnosis not present

## 2014-02-09 DIAGNOSIS — I509 Heart failure, unspecified: Secondary | ICD-10-CM | POA: Diagnosis not present

## 2014-02-09 DIAGNOSIS — Z7901 Long term (current) use of anticoagulants: Secondary | ICD-10-CM | POA: Diagnosis not present

## 2014-02-09 DIAGNOSIS — Z5189 Encounter for other specified aftercare: Secondary | ICD-10-CM | POA: Diagnosis not present

## 2014-02-09 DIAGNOSIS — R627 Adult failure to thrive: Secondary | ICD-10-CM | POA: Diagnosis not present

## 2014-02-09 DIAGNOSIS — I4891 Unspecified atrial fibrillation: Secondary | ICD-10-CM | POA: Diagnosis not present

## 2014-02-13 DIAGNOSIS — R627 Adult failure to thrive: Secondary | ICD-10-CM | POA: Diagnosis not present

## 2014-02-13 DIAGNOSIS — Z5189 Encounter for other specified aftercare: Secondary | ICD-10-CM | POA: Diagnosis not present

## 2014-02-13 DIAGNOSIS — Z7901 Long term (current) use of anticoagulants: Secondary | ICD-10-CM | POA: Diagnosis not present

## 2014-02-13 DIAGNOSIS — I4891 Unspecified atrial fibrillation: Secondary | ICD-10-CM | POA: Diagnosis not present

## 2014-02-13 DIAGNOSIS — I5043 Acute on chronic combined systolic (congestive) and diastolic (congestive) heart failure: Secondary | ICD-10-CM | POA: Diagnosis not present

## 2014-02-13 DIAGNOSIS — I509 Heart failure, unspecified: Secondary | ICD-10-CM | POA: Diagnosis not present

## 2014-02-15 DIAGNOSIS — Z7901 Long term (current) use of anticoagulants: Secondary | ICD-10-CM | POA: Diagnosis not present

## 2014-02-15 DIAGNOSIS — I4891 Unspecified atrial fibrillation: Secondary | ICD-10-CM | POA: Diagnosis not present

## 2014-02-15 DIAGNOSIS — R627 Adult failure to thrive: Secondary | ICD-10-CM | POA: Diagnosis not present

## 2014-02-15 DIAGNOSIS — I5043 Acute on chronic combined systolic (congestive) and diastolic (congestive) heart failure: Secondary | ICD-10-CM | POA: Diagnosis not present

## 2014-02-15 DIAGNOSIS — I509 Heart failure, unspecified: Secondary | ICD-10-CM | POA: Diagnosis not present

## 2014-02-15 DIAGNOSIS — Z5189 Encounter for other specified aftercare: Secondary | ICD-10-CM | POA: Diagnosis not present

## 2014-02-17 DIAGNOSIS — Z7901 Long term (current) use of anticoagulants: Secondary | ICD-10-CM | POA: Diagnosis not present

## 2014-02-17 DIAGNOSIS — I5043 Acute on chronic combined systolic (congestive) and diastolic (congestive) heart failure: Secondary | ICD-10-CM | POA: Diagnosis not present

## 2014-02-17 DIAGNOSIS — Z5189 Encounter for other specified aftercare: Secondary | ICD-10-CM | POA: Diagnosis not present

## 2014-02-17 DIAGNOSIS — I4891 Unspecified atrial fibrillation: Secondary | ICD-10-CM | POA: Diagnosis not present

## 2014-02-17 DIAGNOSIS — R627 Adult failure to thrive: Secondary | ICD-10-CM | POA: Diagnosis not present

## 2014-02-17 DIAGNOSIS — I509 Heart failure, unspecified: Secondary | ICD-10-CM | POA: Diagnosis not present

## 2014-02-24 ENCOUNTER — Other Ambulatory Visit: Payer: Self-pay | Admitting: Internal Medicine

## 2014-02-24 ENCOUNTER — Other Ambulatory Visit: Payer: Self-pay | Admitting: General Practice

## 2014-02-24 MED ORDER — WARFARIN SODIUM 1 MG PO TABS: ORAL_TABLET | ORAL | Status: DC

## 2014-03-01 ENCOUNTER — Ambulatory Visit (INDEPENDENT_AMBULATORY_CARE_PROVIDER_SITE_OTHER): Payer: Medicare Other | Admitting: General Practice

## 2014-03-01 DIAGNOSIS — Z5181 Encounter for therapeutic drug level monitoring: Secondary | ICD-10-CM

## 2014-03-01 DIAGNOSIS — I4891 Unspecified atrial fibrillation: Secondary | ICD-10-CM | POA: Diagnosis not present

## 2014-03-01 LAB — POCT INR: INR: 2

## 2014-03-01 NOTE — Progress Notes (Signed)
Pre visit review using our clinic review tool, if applicable. No additional management support is needed unless otherwise documented below in the visit note. 

## 2014-03-07 DIAGNOSIS — H6120 Impacted cerumen, unspecified ear: Secondary | ICD-10-CM | POA: Diagnosis not present

## 2014-03-16 ENCOUNTER — Encounter (HOSPITAL_COMMUNITY): Payer: Self-pay | Admitting: Emergency Medicine

## 2014-03-16 ENCOUNTER — Emergency Department (HOSPITAL_COMMUNITY)
Admission: EM | Admit: 2014-03-16 | Discharge: 2014-03-16 | Disposition: A | Discharge status: Home / Self Care | Payer: Medicare Other | Attending: Emergency Medicine | Admitting: Emergency Medicine

## 2014-03-16 ENCOUNTER — Emergency Department (HOSPITAL_COMMUNITY): Payer: Medicare Other

## 2014-03-16 DIAGNOSIS — S065X0A Traumatic subdural hemorrhage without loss of consciousness, initial encounter: Secondary | ICD-10-CM | POA: Diagnosis not present

## 2014-03-16 DIAGNOSIS — S065XAA Traumatic subdural hemorrhage with loss of consciousness status unknown, initial encounter: Secondary | ICD-10-CM | POA: Diagnosis not present

## 2014-03-16 DIAGNOSIS — E785 Hyperlipidemia, unspecified: Secondary | ICD-10-CM | POA: Insufficient documentation

## 2014-03-16 DIAGNOSIS — W19XXXA Unspecified fall, initial encounter: Secondary | ICD-10-CM

## 2014-03-16 DIAGNOSIS — K219 Gastro-esophageal reflux disease without esophagitis: Secondary | ICD-10-CM | POA: Diagnosis not present

## 2014-03-16 DIAGNOSIS — S066XAA Traumatic subarachnoid hemorrhage with loss of consciousness status unknown, initial encounter: Secondary | ICD-10-CM | POA: Diagnosis not present

## 2014-03-16 DIAGNOSIS — S0101XA Laceration without foreign body of scalp, initial encounter: Secondary | ICD-10-CM

## 2014-03-16 DIAGNOSIS — IMO0002 Reserved for concepts with insufficient information to code with codable children: Secondary | ICD-10-CM | POA: Diagnosis not present

## 2014-03-16 DIAGNOSIS — Z7901 Long term (current) use of anticoagulants: Secondary | ICD-10-CM | POA: Insufficient documentation

## 2014-03-16 DIAGNOSIS — Z23 Encounter for immunization: Secondary | ICD-10-CM | POA: Insufficient documentation

## 2014-03-16 DIAGNOSIS — Z951 Presence of aortocoronary bypass graft: Secondary | ICD-10-CM | POA: Diagnosis not present

## 2014-03-16 DIAGNOSIS — I509 Heart failure, unspecified: Secondary | ICD-10-CM | POA: Diagnosis not present

## 2014-03-16 DIAGNOSIS — M129 Arthropathy, unspecified: Secondary | ICD-10-CM | POA: Insufficient documentation

## 2014-03-16 DIAGNOSIS — Z79899 Other long term (current) drug therapy: Secondary | ICD-10-CM | POA: Insufficient documentation

## 2014-03-16 DIAGNOSIS — M533 Sacrococcygeal disorders, not elsewhere classified: Secondary | ICD-10-CM | POA: Diagnosis not present

## 2014-03-16 DIAGNOSIS — S0100XA Unspecified open wound of scalp, initial encounter: Secondary | ICD-10-CM | POA: Insufficient documentation

## 2014-03-16 DIAGNOSIS — I4891 Unspecified atrial fibrillation: Secondary | ICD-10-CM | POA: Diagnosis not present

## 2014-03-16 DIAGNOSIS — Z7982 Long term (current) use of aspirin: Secondary | ICD-10-CM | POA: Insufficient documentation

## 2014-03-16 DIAGNOSIS — S0190XA Unspecified open wound of unspecified part of head, initial encounter: Secondary | ICD-10-CM | POA: Diagnosis not present

## 2014-03-16 DIAGNOSIS — I2510 Atherosclerotic heart disease of native coronary artery without angina pectoris: Secondary | ICD-10-CM | POA: Diagnosis not present

## 2014-03-16 DIAGNOSIS — T1490XA Injury, unspecified, initial encounter: Secondary | ICD-10-CM | POA: Diagnosis not present

## 2014-03-16 DIAGNOSIS — Y9389 Activity, other specified: Secondary | ICD-10-CM | POA: Insufficient documentation

## 2014-03-16 DIAGNOSIS — Z950 Presence of cardiac pacemaker: Secondary | ICD-10-CM | POA: Insufficient documentation

## 2014-03-16 DIAGNOSIS — I129 Hypertensive chronic kidney disease with stage 1 through stage 4 chronic kidney disease, or unspecified chronic kidney disease: Secondary | ICD-10-CM | POA: Diagnosis not present

## 2014-03-16 DIAGNOSIS — W1809XA Striking against other object with subsequent fall, initial encounter: Secondary | ICD-10-CM | POA: Insufficient documentation

## 2014-03-16 DIAGNOSIS — M545 Low back pain, unspecified: Secondary | ICD-10-CM | POA: Diagnosis not present

## 2014-03-16 DIAGNOSIS — Z87891 Personal history of nicotine dependence: Secondary | ICD-10-CM | POA: Insufficient documentation

## 2014-03-16 DIAGNOSIS — I10 Essential (primary) hypertension: Secondary | ICD-10-CM | POA: Diagnosis not present

## 2014-03-16 DIAGNOSIS — S066X0A Traumatic subarachnoid hemorrhage without loss of consciousness, initial encounter: Secondary | ICD-10-CM | POA: Insufficient documentation

## 2014-03-16 DIAGNOSIS — Y929 Unspecified place or not applicable: Secondary | ICD-10-CM | POA: Insufficient documentation

## 2014-03-16 DIAGNOSIS — S066X9A Traumatic subarachnoid hemorrhage with loss of consciousness of unspecified duration, initial encounter: Secondary | ICD-10-CM

## 2014-03-16 DIAGNOSIS — Z8546 Personal history of malignant neoplasm of prostate: Secondary | ICD-10-CM | POA: Diagnosis not present

## 2014-03-16 DIAGNOSIS — N189 Chronic kidney disease, unspecified: Secondary | ICD-10-CM | POA: Diagnosis not present

## 2014-03-16 DIAGNOSIS — I6200 Nontraumatic subdural hemorrhage, unspecified: Secondary | ICD-10-CM

## 2014-03-16 LAB — BASIC METABOLIC PANEL
BUN: 23 mg/dL (ref 6–23)
CALCIUM: 9.2 mg/dL (ref 8.4–10.5)
CO2: 22 mEq/L (ref 19–32)
Chloride: 106 mEq/L (ref 96–112)
Creatinine, Ser: 1.03 mg/dL (ref 0.50–1.35)
GFR, EST AFRICAN AMERICAN: 70 mL/min — AB (ref >90)
GFR, EST NON AFRICAN AMERICAN: 60 mL/min — AB (ref >90)
GLUCOSE: 110 mg/dL — AB (ref 70–99)
POTASSIUM: 4.8 meq/L (ref 3.7–5.3)
Sodium: 142 mEq/L (ref 137–147)

## 2014-03-16 LAB — CBC WITH DIFFERENTIAL/PLATELET
BASOS ABS: 0 10*3/uL (ref 0.0–0.1)
BASOS PCT: 0 % (ref 0–1)
EOS PCT: 1 % (ref 0–5)
Eosinophils Absolute: 0.1 10*3/uL (ref 0.0–0.7)
HCT: 40.3 % (ref 39.0–52.0)
Hemoglobin: 13.4 g/dL (ref 13.0–17.0)
Lymphocytes Relative: 12 % (ref 12–46)
Lymphs Abs: 1.4 10*3/uL (ref 0.7–4.0)
MCH: 29.7 pg (ref 26.0–34.0)
MCHC: 33.3 g/dL (ref 30.0–36.0)
MCV: 89.4 fL (ref 78.0–100.0)
Monocytes Absolute: 1.2 10*3/uL — ABNORMAL HIGH (ref 0.1–1.0)
Monocytes Relative: 10 % (ref 3–12)
NEUTROS PCT: 77 % (ref 43–77)
Neutro Abs: 9 10*3/uL — ABNORMAL HIGH (ref 1.7–7.7)
PLATELETS: 198 10*3/uL (ref 150–400)
RBC: 4.51 MIL/uL (ref 4.22–5.81)
RDW: 16.3 % — ABNORMAL HIGH (ref 11.5–15.5)
WBC: 11.7 10*3/uL — ABNORMAL HIGH (ref 4.0–10.5)

## 2014-03-16 LAB — PROTIME-INR
INR: 2.27 — AB (ref 0.00–1.49)
PROTHROMBIN TIME: 24.3 s — AB (ref 11.6–15.2)

## 2014-03-16 MED ORDER — TETANUS-DIPHTH-ACELL PERTUSSIS 5-2.5-18.5 LF-MCG/0.5 IM SUSP
0.5000 mL | Freq: Once | INTRAMUSCULAR | Status: AC
  Administered 2014-03-16: 0.5 mL via INTRAMUSCULAR
  Filled 2014-03-16: qty 0.5

## 2014-03-16 NOTE — Discharge Instructions (Signed)
Patient status post fall discussed with Dr. Ronnald Ramp from neurosurgery. Amount of blood on the brain is small and does not require admission. However we do recommend that she talk to your regular Dr. and/or cardiologist about stopping the Coumadin because that is a significant risk for future falls. Staple removal in 7-10 days. Keep the wound on the back of the head dry for 24 hours and then bath regularly. It is okay to apply Polysporin or Neosporin type ointment to the wound.  Return for any new or worse symptoms.

## 2014-03-16 NOTE — ED Provider Notes (Signed)
CSN: [id number]     Arrival date & time 03/16/14  1603 History   First MD Initiated Contact with Patient 03/16/14 1628     Chief Complaint  Patient presents with  . Fall     (Consider location/radiation/quality/duration/timing/severity/associated sxs/prior Treatment) Patient is a 80 y.o. male presenting with fall. The history is provided by the patient and a relative.  Fall Associated symptoms include headaches. Pertinent negatives include no chest pain, no abdominal pain and no shortness of breath.   patient is on Coumadin for her atrial fibrillation. Patient also has a pacemaker. Patient did not have a syncopal episode but doubt fell getting out of his scooter chair fell onto his buttocks complaining of pain to low back and tailbone area and then rolled back and hit his head on the concrete. Resulting in bleeding from the back of his head. No loss of consciousness. No other complaints. No chest pain no shortness of breath or belly pain no arm pain no leg pain.  Past Medical History  Diagnosis Date  . Paroxysmal atrial fibrillation   . CAD (coronary artery disease)     CABG 08/1973  . CHF (congestive heart failure)     ICM  . Pacemaker 09/2003  . Hypertension   . Hyperlipidemia   . Unsteady gait     due to B ankle DJD and instability  . Memory loss   . Chronic renal insufficiency   . Barrett's esophagus   . Diverticulosis of colon   . GERD (gastroesophageal reflux disease)   . Adenocarcinoma of prostate 10/2005    lupron injections q44mo  . Atrial fibrillation     chronic anticoag  . Arthritis     ?rheumatoid, never dx or on DMARDs  . Osteoporosis    Past Surgical History  Procedure Laterality Date  . Pacemaker placement  09/2003  . Hernia repair      age 77 ( not sure if it was right or left side)  . Coronary artery bypass graft  08/1973    3 heart heart arteries were 80 percent clogged. the lower valve of my heart is not functioning  . Doppler echocardiography  2004,  2008   History reviewed. No pertinent family history. History  Substance Use Topics  . Smoking status: Former Smoker    Quit date: 08/05/1986  . Smokeless tobacco: Not on file  . Alcohol Use: Yes     Comment: samll glass of wine    Review of Systems  Constitutional: Negative for fever.  HENT: Negative for congestion.   Eyes: Negative for visual disturbance.  Respiratory: Negative for shortness of breath.   Cardiovascular: Negative for chest pain.  Gastrointestinal: Negative for nausea, vomiting and abdominal pain.  Genitourinary: Negative for dysuria.  Musculoskeletal: Positive for back pain. Negative for neck pain.  Skin: Positive for wound. Negative for rash.  Neurological: Positive for headaches. Negative for syncope, weakness and numbness.  Psychiatric/Behavioral: Negative for confusion.      Allergies  Review of patient's allergies indicates no known allergies.  Home Medications   Prior to Admission medications   Medication Sig Start Date End Date Taking? Authorizing Provider  aspirin EC 325 MG tablet Take 1 tablet (325 mg total) by mouth daily. 07/06/13  Yes Rowe Clack, MD  benazepril (LOTENSIN) 5 MG tablet Take 2.5 mg by mouth daily.   Yes Historical Provider, MD  furosemide (LASIX) 40 MG tablet Take 1 tablet (40 mg total) by mouth daily. 07/06/13  Yes Jannifer Rodney  Asa Lente, MD  Misc Natural Products (LUTEIN 20 PO) Take 1 tablet by mouth daily.    Yes Historical Provider, MD  nitroGLYCERIN (NITROSTAT) 0.4 MG SL tablet Place 1 tablet (0.4 mg total) under the tongue every 5 (five) minutes as needed. 04/26/13  Yes Rowe Clack, MD  potassium chloride (K-DUR,KLOR-CON) 10 MEQ tablet Take 10 mEq by mouth 2 (two) times daily.   Yes Historical Provider, MD  pravastatin (PRAVACHOL) 10 MG tablet Take 10 mg by mouth daily.   Yes Historical Provider, MD  ranitidine (ZANTAC) 150 MG tablet Take 150 mg by mouth at bedtime.   Yes Historical Provider, MD  vitamin C (ASCORBIC  ACID) 500 MG tablet Take 1 tablet (500 mg total) by mouth daily. 07/06/13  Yes Rowe Clack, MD  warfarin (COUMADIN) 1 MG tablet Take as directed by anticoagulation clinic 02/24/14  Yes Rowe Clack, MD   BP 126/76  Pulse 82  Temp(Src) 98.1 F (36.7 C) (Oral)  Resp 18  SpO2 95% Physical Exam  Nursing note and vitals reviewed. Constitutional: He is oriented to person, place, and time. He appears well-developed and well-nourished. No distress.  HENT:  Head: Normocephalic.  4 cm laceration to the back of the head no active bleeding. No palpable step off on the skull.  Eyes: Conjunctivae and EOM are normal. Pupils are equal, round, and reactive to light.  Neck: Normal range of motion. Neck supple.  Neck nontender  Cardiovascular: Normal rate, regular rhythm and normal heart sounds.   No murmur heard. Pulmonary/Chest: Effort normal and breath sounds normal. No respiratory distress.  Abdominal: Soft. Bowel sounds are normal. There is no tenderness.  Musculoskeletal: Normal range of motion. He exhibits no tenderness.  Neurological: He is alert and oriented to person, place, and time. No cranial nerve deficit. He exhibits normal muscle tone. Coordination normal.  Skin: Skin is warm. No rash noted.    ED Course  Procedures (including critical care time) Labs Review Labs Reviewed  CBC WITH DIFFERENTIAL - Abnormal; Notable for the following:    WBC 11.7 (*)    RDW 16.3 (*)    Neutro Abs 9.0 (*)    Monocytes Absolute 1.2 (*)    All other components within normal limits  BASIC METABOLIC PANEL - Abnormal; Notable for the following:    Glucose, Bld 110 (*)    GFR calc non Af Amer 60 (*)    GFR calc Af Amer 70 (*)    All other components within normal limits  PROTIME-INR - Abnormal; Notable for the following:    Prothrombin Time 24.3 (*)    INR 2.27 (*)    All other components within normal limits   Results for orders placed during the hospital encounter of 03/16/14  CBC  WITH DIFFERENTIAL      Result Value Ref Range   WBC 11.7 (*) 4.0 - 10.5 K/uL   RBC 4.51  4.22 - 5.81 MIL/uL   Hemoglobin 13.4  13.0 - 17.0 g/dL   HCT 40.3  39.0 - 52.0 %   MCV 89.4  78.0 - 100.0 fL   MCH 29.7  26.0 - 34.0 pg   MCHC 33.3  30.0 - 36.0 g/dL   RDW 16.3 (*) 11.5 - 15.5 %   Platelets 198  150 - 400 K/uL   Neutrophils Relative % 77  43 - 77 %   Neutro Abs 9.0 (*) 1.7 - 7.7 K/uL   Lymphocytes Relative 12  12 - 46 %  Lymphs Abs 1.4  0.7 - 4.0 K/uL   Monocytes Relative 10  3 - 12 %   Monocytes Absolute 1.2 (*) 0.1 - 1.0 K/uL   Eosinophils Relative 1  0 - 5 %   Eosinophils Absolute 0.1  0.0 - 0.7 K/uL   Basophils Relative 0  0 - 1 %   Basophils Absolute 0.0  0.0 - 0.1 K/uL  BASIC METABOLIC PANEL      Result Value Ref Range   Sodium 142  137 - 147 mEq/L   Potassium 4.8  3.7 - 5.3 mEq/L   Chloride 106  96 - 112 mEq/L   CO2 22  19 - 32 mEq/L   Glucose, Bld 110 (*) 70 - 99 mg/dL   BUN 23  6 - 23 mg/dL   Creatinine, Ser 1.03  0.50 - 1.35 mg/dL   Calcium 9.2  8.4 - 10.5 mg/dL   GFR calc non Af Amer 60 (*) >90 mL/min   GFR calc Af Amer 70 (*) >90 mL/min  PROTIME-INR      Result Value Ref Range   Prothrombin Time 24.3 (*) 11.6 - 15.2 seconds   INR 2.27 (*) 0.00 - 1.49    Imaging Review Dg Lumbar Spine Complete  03/16/2014   CLINICAL DATA:  Fall today, low back pain.  EXAM: LUMBAR SPINE - COMPLETE 4+ VIEW  COMPARISON:  DG SACRUM/COCCYX dated 03/16/2014; CT ABDOMEN W/O CM dated 01/05/2009  FINDINGS: Lumbar vertebral bodies appear intact and aligned with maintenance of lumbar lordosis. Severe L4-5 degenerative disc disease, mild to moderate remaining lumbar levels is unchanged. Moderate lower lumbar facet arthropathy. Bone mineral density is decreased without destructive bony lesions. No pars interarticularis defects. Moderate vascular calcifications. Infrarenal abdominal aortic aneurysm, 4.6 cm in AP dimension as measured by calcific density, this measured 3.5 cm on prior CT.  Phleboliths in the pelvis.  Chronic moderate-to-severe T12 compression fracture.  IMPRESSION: No acute lumbar spine fracture deformity or malalignment.  Similar lumbar spondylosis.  Possible interval growth of infrarenal abdominal aortic aneurysm, now measuring 4.6 cm by plain radiograph.   Electronically Signed   By: Elon Alas   On: 03/16/2014 19:30   Dg Sacrum/coccyx  03/16/2014   CLINICAL DATA:  Fall, tail pain.  EXAM: SACRUM AND COCCYX - 2+ VIEW  COMPARISON:  None.  FINDINGS: There is no evidence of fracture or other focal bone lesions. Decreased bone mineral density. Surgical clips project in left inguinal region. Vascular calcifications. Phleboliths in the pelvis.  IMPRESSION: No acute fracture deformity or malalignment. Osteopenia, which decreases sensitivity for acute nondisplaced fractures.   Electronically Signed   By: Elon Alas   On: 03/16/2014 19:32   Ct Head Wo Contrast  03/16/2014   CLINICAL DATA:  Status post fall, laceration to the occipital region  EXAM: CT HEAD WITHOUT CONTRAST  TECHNIQUE: Contiguous axial images were obtained from the base of the skull through the vertex without intravenous contrast.  COMPARISON:  CT HEAD W/O CM dated 07/28/2008  FINDINGS: There is no evidence of mass effect or midline shift. There is no evidence of a space-occupying lesion. There is no evidence of a cortical-based area of acute infarction. There is a small amount of hyperdense material along the interhemispheric falx likely representing a small amount subdural hemorrhage. There is a small amount hyperdense material in the medial right frontal sulci likely representing a trace amount of subarachnoid hemorrhage. 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 There is generalized cerebral atrophy. There  is periventricular white matter low attenuation likely secondary to microangiopathy.  The ventricles and sulci are appropriate for the patient's age. The basal cisterns are patent.   Visualized portions of the orbits are unremarkable. The visualized portions of the paranasal sinuses and mastoid air cells are unremarkable. There are intracranial vascular atherosclerotic changes.  The osseous structures are unremarkable.  IMPRESSION: 1. Small volume of subdural hemorrhage along the interhemispheric falx. Small volume right frontal subarachnoid hemorrhage. Critical Value/emergent results were called by telephone at the time of interpretation on 03/16/2014 at 6:32 PM to Dr. Fredia Sorrow , who verbally acknowledged these results.   Electronically Signed   By: Kathreen Devoid   On: 03/16/2014 18:32     EKG Interpretation None      LACERATION REPAIR Performed by: Mervin Kung Authorized by: Mervin Kung Consent: Verbal consent obtained. Risks and benefits: risks, benefits and alternatives were discussed Consent given by: patient Patient identity confirmed: provided demographic data Prepped and Draped in normal sterile fashion Wound explored  Laceration Location: Back of the head scalp  Laceration Length: 4 m  No Foreign Bodies seen or palpated  Anesthesia: local infiltration  No local anesthesia.     Irrigation method: syringe Amount of cleaning: standard  Skin closure: Stainless staples   Number of sutures: 4   Technique: Simple interrupted   Patient tolerance: Patient tolerated the procedure well with no immediate complications.     MDM   Final diagnoses:  Fall  Scalp laceration  Subdural bleeding  Subarachnoid hemorrhage following injury    Patient status post fall resulting in a 4 cm scalp laceration. CT of the head showed a small amount of subdural and subarachnoid blood. Had head CT reviewed by Dr. Ronnald Ramp from neurosurgery. He provided the okay for the patient to go home. He was aware that he is on Coumadin. Patient is therapeutic on Coumadin. We will have the patient be reassessed by primary care doctor and/or cardiologist for  consideration of stopping the Coumadin at some time in the future to prevent future risk since the patient is a fall risk. X-rays of the lumbar back in the sacral coccyx area were negative. Patient tolerated having a staple repair to the scalp wound fine. Patient's mentating fine.    Mervin Kung, MD 03/16/14 2129

## 2014-03-16 NOTE — Progress Notes (Signed)
Patient ID: [name], male   DOB: 1917/09/23, 80 y.o.   MRN: [id number] Asked to review CT head after fall. Pt neurologically normal per EDP. CT shows minimal blood along falx. Despite his coumadin, the risk of clinically significant enlargement is quite low according to latest studies, as repeat imaging only changed management decisions 4% of the time. Potentially could develop delayed contusions but risk is low.. Could be observed by hospitalists. My biggest concern is the risk of other clinically significant bleeding complications secondary to coumadin use  in a 80 yo with Afib vs the risk of stroke in this same pt.

## 2014-03-16 NOTE — ED Notes (Addendum)
Pt reports to the ED for eval of laceration to the occipital region of his head following a fall. Pt was weeding the yard when he fell over backwards and hit his head on the concrete. Pt is taking Coumadin. Bleeding controlled at this time. Pt denies any LOC, neck pain, numbness, or tingling. He does report some lower back pain and soreness. Pt denies any pelvic pain. Pt did not walk following the episode. No neuro deficits noted. Pt able to move all extremities. Pt reports movement makes the back pain worse. Pt also has chronic arthritis pain. Pt A&Ox4, resp e/u, and skin warm and dry.

## 2014-03-17 ENCOUNTER — Telehealth (HOSPITAL_BASED_OUTPATIENT_CLINIC_OR_DEPARTMENT_OTHER): Payer: Self-pay

## 2014-03-17 NOTE — Telephone Encounter (Signed)
Call from Radiology w/incidental finding on xray regarding infrarenal AAA growth.  Dr Stevie Kern notified and after review of the chart would like pts PCP Dr Gwendolyn Grant notified.  Results faxed to her attn @ [phone].

## 2014-03-23 ENCOUNTER — Encounter: Payer: Self-pay | Admitting: Internal Medicine

## 2014-03-23 ENCOUNTER — Other Ambulatory Visit: Payer: Self-pay | Admitting: Internal Medicine

## 2014-03-23 ENCOUNTER — Ambulatory Visit (INDEPENDENT_AMBULATORY_CARE_PROVIDER_SITE_OTHER): Payer: Medicare Other | Admitting: Internal Medicine

## 2014-03-23 VITALS — BP 110/58 | HR 79 | Temp 97.0°F | Wt 182.2 lb

## 2014-03-23 DIAGNOSIS — I6200 Nontraumatic subdural hemorrhage, unspecified: Secondary | ICD-10-CM | POA: Diagnosis not present

## 2014-03-23 DIAGNOSIS — Z4802 Encounter for removal of sutures: Secondary | ICD-10-CM | POA: Diagnosis not present

## 2014-03-23 DIAGNOSIS — S065X9A Traumatic subdural hemorrhage with loss of consciousness of unspecified duration, initial encounter: Secondary | ICD-10-CM

## 2014-03-23 DIAGNOSIS — S065XAA Traumatic subdural hemorrhage with loss of consciousness status unknown, initial encounter: Secondary | ICD-10-CM

## 2014-03-23 NOTE — Progress Notes (Signed)
Pre visit review using our clinic review tool, if applicable. No additional management support is needed unless otherwise documented below in the visit note. 

## 2014-03-23 NOTE — Patient Instructions (Signed)
   Simply cleaned the scabbed area with soap and water  Use a topical over-the-counter cream on the area of the  dermatitis and blow dry with hair dryer; perform this twice a day

## 2014-03-23 NOTE — Progress Notes (Signed)
   Subjective:    Patient ID: JOHNELL BAS, male    DOB: 1917/08/15, 80 y.o.   MRN: [id number]  HPI    He was attempting to weed a garden area 03/16/14 when he lost his balance and struck his head on the sidewalk. He was seen in emergency room and 4 metal sutures placeed to control bleeding.  CT scan did reveal very small subdural hematoma and subarachnoid hemorrhage.  The neurosurgeon did not feel that further intervention was necessary; but he did recommend that discussion be held with the cardiologist as to the risk:benefits of continuing warfarin.  Neither prior to the fall nor since has he had any cardiac or neurologic prodrome-type symptoms. He has a pacemaker      Review of Systems   Specifically denied were any change in heart rhythm or rate prior to the event. There was no associated chest pain or shortness of breath .  Specifically denied also were headache or limb weakness, tingling, or numbness. No seizure activity noted.   He's had a minor erythematous rash at the waist in the inguinal area. His wife is a similar issue which has responded to a topical over-the-counter preparation.     Objective:   Physical Exam   He is alert and oriented; he appears much younger than his stated age  Pupils are small; EOMI  He is wearing hearing aids bilaterally.  He has no lymphadenopathy about the head, neck or axilla.  Heart rhythm is regular; heart sounds are somewhat distant.  He has no carotid bruits  There is a dense eschar over the posterior crown; no evidence of cellulitis clinically. The 4 sutures were removed without difficulty.  Very faint erythema at the waistline anteriorly. No maceration or vesicle formation.        Assessment & Plan:   head trauma with subdural hematoma and subarachnoid hemorrhage. No evidence of neurologic deficit at this time #2 suture removal without complication  Plan: He will discuss the pros and cons of continuing warfarin when  he is seen by his new cardiologist

## 2014-03-29 ENCOUNTER — Ambulatory Visit (INDEPENDENT_AMBULATORY_CARE_PROVIDER_SITE_OTHER): Payer: Medicare Other | Admitting: General Practice

## 2014-03-29 DIAGNOSIS — Z5181 Encounter for therapeutic drug level monitoring: Secondary | ICD-10-CM

## 2014-03-29 DIAGNOSIS — I4891 Unspecified atrial fibrillation: Secondary | ICD-10-CM

## 2014-03-29 LAB — POCT INR: INR: 1.9

## 2014-03-29 NOTE — Progress Notes (Signed)
Pre visit review using our clinic review tool, if applicable. No additional management support is needed unless otherwise documented below in the visit note. 

## 2014-04-13 ENCOUNTER — Encounter: Payer: Self-pay | Admitting: Cardiovascular Disease

## 2014-04-13 ENCOUNTER — Ambulatory Visit (INDEPENDENT_AMBULATORY_CARE_PROVIDER_SITE_OTHER): Payer: Medicare Other | Admitting: Cardiovascular Disease

## 2014-04-13 VITALS — BP 129/68 | HR 78 | Ht 66.5 in | Wt 184.2 lb

## 2014-04-13 DIAGNOSIS — I2510 Atherosclerotic heart disease of native coronary artery without angina pectoris: Secondary | ICD-10-CM

## 2014-04-13 DIAGNOSIS — I509 Heart failure, unspecified: Secondary | ICD-10-CM

## 2014-04-13 DIAGNOSIS — I5042 Chronic combined systolic (congestive) and diastolic (congestive) heart failure: Secondary | ICD-10-CM

## 2014-04-13 DIAGNOSIS — I4891 Unspecified atrial fibrillation: Secondary | ICD-10-CM | POA: Diagnosis not present

## 2014-04-13 DIAGNOSIS — I10 Essential (primary) hypertension: Secondary | ICD-10-CM

## 2014-04-13 NOTE — Patient Instructions (Signed)
Your physician recommends that you schedule a follow-up appointment in:   [place] has recommended you make the following change in your medication: STOP   COUMADIN

## 2014-04-13 NOTE — Assessment & Plan Note (Signed)
Well controlled.  Continue current medications and low sodium Dash type diet.    

## 2014-04-13 NOTE — Assessment & Plan Note (Signed)
Distant CABG ? 1974  No angina  Observe

## 2014-04-13 NOTE — Assessment & Plan Note (Signed)
Euvolemic no clinical CHF  Continue current meds

## 2014-04-13 NOTE — Progress Notes (Signed)
Patient ID: [name], male   DOB: 11-14-1916, 80 y.o.   MRN: [id number] [name] is a 80 year old man with symptomatic bradcardia s/p PPM implant, CAD, PAF and HTN who sees Dr Lovena Le and use to see TW . His PPM battery reached ERI in April 2014. And was replaced.  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  Has multiple illustrated books/ stories regarding his time in [employer] school.     ROS: Denies fever, malais, weight loss, blurry vision, decreased visual acuity, cough, sputum, SOB, hemoptysis, pleuritic pain, palpitaitons, heartburn, abdominal pain, melena, lower extremity edema, claudication, or rash.  All other systems reviewed and negative  General: Affect appropriate Elderly kyphotic male  HEENT: normal  Scalp laceration healed and sutures removed  Neck supple with no adenopathy JVP normal no bruits no thyromegaly Lungs clear with no wheezing and good diaphragmatic motion Heart:  S1/S2 SEM  murmur, no rub, gallop or click PMI normal Abdomen: benighn, BS positve, no tenderness, no AAA no bruit.  No HSM or HJR Distal pulses intact with no bruits No edema Neuro non-focal Skin warm and dry Braces on ankles    Current Outpatient Prescriptions  Medication Sig Dispense Refill  . aspirin EC 325 MG tablet Take 1 tablet (325 mg total) by mouth daily.  100 tablet  3  . benazepril (LOTENSIN) 5 MG tablet Take 2.5 mg by mouth daily.      . furosemide (LASIX) 40 MG tablet Take 1 tablet (40 mg total) by mouth daily.  30 tablet    . Misc Natural Products (LUTEIN 20 PO) Take 1 tablet by mouth daily.       . nitroGLYCERIN (NITROSTAT) 0.4 MG SL tablet Place 1 tablet (0.4 mg total) under the tongue every 5 (five) minutes as needed.  25 tablet   1  . potassium chloride (K-DUR) 10 MEQ tablet TAKE 1 TABLET BY MOUTH DAILY  30 tablet  2  . pravastatin (PRAVACHOL) 10 MG tablet Take 10 mg by mouth daily.      . ranitidine (ZANTAC) 150 MG tablet Take 150 mg by mouth at bedtime.      . vitamin C (ASCORBIC ACID) 500 MG tablet Take 1 tablet (500 mg total) by mouth daily.      Marland Kitchen warfarin (COUMADIN) 1 MG tablet Take as directed by anticoagulation clinic  45 tablet  3   No current facility-administered medications for this visit.    Allergies  Review of patient's allergies indicates no known allergies.  Electrocardiogram:  SR rate 77 lateral T wave changes   Assessment and Plan

## 2014-04-13 NOTE — Assessment & Plan Note (Signed)
I presume he is on coumadin for PAF  He has had a subdural with subarachnoid extension.  He is very unstable on his feet with walker and cane.  He needs braces on his ankles  Discussed with patient and daughter and they agree it is time to stop coumadin.  He is in NSR today And pacer clinic can check pacer to see % of time he is in afib but I think it should be stopped regardless

## 2014-04-21 ENCOUNTER — Encounter: Payer: Self-pay | Admitting: Internal Medicine

## 2014-04-21 ENCOUNTER — Ambulatory Visit (INDEPENDENT_AMBULATORY_CARE_PROVIDER_SITE_OTHER): Payer: Medicare Other | Admitting: Podiatrist

## 2014-04-21 ENCOUNTER — Encounter: Payer: Self-pay | Admitting: Podiatrist

## 2014-04-21 VITALS — BP 118/69 | HR 79 | Resp 18

## 2014-04-21 DIAGNOSIS — M79609 Pain in unspecified limb: Secondary | ICD-10-CM

## 2014-04-21 DIAGNOSIS — B351 Tinea unguium: Secondary | ICD-10-CM | POA: Diagnosis not present

## 2014-04-21 NOTE — Progress Notes (Signed)
Just a trim of my nails  HPI:  Patient presents today for follow up of foot and nail care. Denies any new complaints today.  Objective:  Patients chart is reviewed.  Vascular status reveals pedal pulses noted at 2 out of 4 dp and pt bilateral .  Neurological sensation is Normal to [employer] monofilament bilateral.  Patients nails are thickened, discolored, distrophic, friable and brittle with yellow-brown discoloration. Patient subjectively relates they are painful with shoes and with ambulation of bilateral feet.  Assessment:  Symptomatic onychomycosis  Plan:  Discussed treatment options and alternatives.  The symptomatic toenails were debrided through manual an mechanical means without complication.  Return appointment recommended at routine intervals of 3 months    Trudie Buckler, DPM

## 2014-04-21 NOTE — Patient Instructions (Signed)
HAPPY 72TH ANNIVERSARY!!  ENJOY YOUR SPECIAL DAY!!

## 2014-05-08 ENCOUNTER — Encounter: Payer: Self-pay | Admitting: Internal Medicine

## 2014-05-08 ENCOUNTER — Ambulatory Visit (INDEPENDENT_AMBULATORY_CARE_PROVIDER_SITE_OTHER): Payer: Medicare Other | Admitting: *Deleted

## 2014-05-08 DIAGNOSIS — I4891 Unspecified atrial fibrillation: Secondary | ICD-10-CM

## 2014-05-08 DIAGNOSIS — I4819 Other persistent atrial fibrillation: Secondary | ICD-10-CM

## 2014-05-08 LAB — MDC_IDC_ENUM_SESS_TYPE_INCLINIC
Battery Voltage: 2.79 V
Brady Statistic AP VP Percent: 7 %
Brady Statistic AP VS Percent: 7 %
Brady Statistic AS VP Percent: 3 %
Date Time Interrogation Session: 20150629160424
Lead Channel Impedance Value: 606 Ohm
Lead Channel Pacing Threshold Amplitude: 1 V
Lead Channel Pacing Threshold Amplitude: 1 V
Lead Channel Pacing Threshold Pulse Width: 0.4 ms
Lead Channel Pacing Threshold Pulse Width: 0.4 ms
Lead Channel Sensing Intrinsic Amplitude: 11.2 mV
Lead Channel Sensing Intrinsic Amplitude: 2 mV
Lead Channel Setting Pacing Amplitude: 2.5 V
MDC IDC MSMT BATTERY IMPEDANCE: 100 Ohm
MDC IDC MSMT BATTERY REMAINING LONGEVITY: 140 mo
MDC IDC MSMT LEADCHNL RA IMPEDANCE VALUE: 454 Ohm
MDC IDC SET LEADCHNL RA PACING AMPLITUDE: 2 V
MDC IDC SET LEADCHNL RV PACING PULSEWIDTH: 0.4 ms
MDC IDC SET LEADCHNL RV SENSING SENSITIVITY: 4 mV
MDC IDC STAT BRADY AS VS PERCENT: 83 %

## 2014-05-08 NOTE — Progress Notes (Signed)
Pacemaker check in clinic. Normal device function. Thresholds, sensing, impedances consistent with previous measurements. Device programmed to maximize longevity. 18.4% mode switched, - coumadin due to fall risk.  11 high ventricular rates noted 2-6 seconds. Device programmed at appropriate safety margins. Histogram distribution appropriate for patient activity level. Device programmed to optimize intrinsic conduction. Estimated longevity 11.5 years. Patient enrolled in remote follow-up/TTM's with [employer]. Plan to follow every 3 months remotely and see annually in office. Patient education completed.  [facility] 08/09/14.

## 2014-05-11 DIAGNOSIS — H6120 Impacted cerumen, unspecified ear: Secondary | ICD-10-CM | POA: Diagnosis not present

## 2014-05-17 ENCOUNTER — Other Ambulatory Visit: Payer: Self-pay | Admitting: Internal Medicine

## 2014-05-18 DIAGNOSIS — M949 Disorder of cartilage, unspecified: Secondary | ICD-10-CM | POA: Diagnosis not present

## 2014-05-18 DIAGNOSIS — C61 Malignant neoplasm of prostate: Secondary | ICD-10-CM | POA: Diagnosis not present

## 2014-05-18 DIAGNOSIS — M899 Disorder of bone, unspecified: Secondary | ICD-10-CM | POA: Diagnosis not present

## 2014-05-26 ENCOUNTER — Ambulatory Visit (INDEPENDENT_AMBULATORY_CARE_PROVIDER_SITE_OTHER): Payer: Medicare Other | Admitting: General Practice

## 2014-06-05 DIAGNOSIS — C61 Malignant neoplasm of prostate: Secondary | ICD-10-CM | POA: Diagnosis not present

## 2014-07-05 ENCOUNTER — Ambulatory Visit (INDEPENDENT_AMBULATORY_CARE_PROVIDER_SITE_OTHER): Payer: Medicare Other | Admitting: Internal Medicine

## 2014-07-05 ENCOUNTER — Encounter: Payer: Self-pay | Admitting: Internal Medicine

## 2014-07-05 VITALS — BP 122/80 | HR 80 | Temp 97.8°F | Wt 180.2 lb

## 2014-07-05 DIAGNOSIS — S40812A Abrasion of left upper arm, initial encounter: Secondary | ICD-10-CM

## 2014-07-05 DIAGNOSIS — IMO0002 Reserved for concepts with insufficient information to code with codable children: Secondary | ICD-10-CM | POA: Diagnosis not present

## 2014-07-05 DIAGNOSIS — I2510 Atherosclerotic heart disease of native coronary artery without angina pectoris: Secondary | ICD-10-CM

## 2014-07-05 NOTE — Progress Notes (Signed)
Pre visit review using our clinic review tool, if applicable. No additional management support is needed unless otherwise documented below in the visit note. 

## 2014-07-05 NOTE — Patient Instructions (Signed)
Dip gauze in  sterile saline and applied to the wound twice a day. Cover the wound with Telfa , non stick dressing  without any antibiotic ointment. The saline can be purchased at the drugstore or you can make your own .Boil cup of salt in a gallon of water. Store mixture  in a clean container.Report Warning  signs as discussed (red streaks, pus, fever, increasing pain). 

## 2014-07-05 NOTE — Progress Notes (Signed)
   Subjective:    Patient ID: [name], male    DOB: [birth year], 80 y.o.   MRN: [id number]  HPI   On 07/01/14 he was working in a narrow area, specifically doing laundry with his body flexed at the waist. He lost his balance and slowly fell over landing on his buttocks. He sustained an abrasion to the left upper extremity laterally. He has been applying triple antibiotic ointment to the abrasion.  There was no cardiac or neurologic prodrome prior to the fall.      Review of Systems  Denied were any change in heart rhythm or rate prior to the event. There was no associated chest pain or shortness of breath .  Also specifically denied prior to the episode were headache, limb weakness, tingling, or numbness. No seizure activity noted.   He has no fever, chills, sweats, or wound purulence.    Objective:   Physical Exam   Positive or pertinent findings: He appears much younger than stated age. He is emplying a rolling walker. He has a grade 2 systolic murmur at the base. He has marked DIP osteoarthritic changes He is wearing braces over his ankles. He is also wearing support hose. Only the dorsalis pedis pulses can be palpated because of these. He has an abrasion over the left upper extremity with some dehiscence of dermis;no evidence of cellulitis or purulence.        Assessment & Plan:  #1 fall which was most likely related to cervical degenerative disc disease and head position. No cardiac or neurologic prodrome suggestive. Preventive measures discussed  #2 abrasion left upper extremity  Plan: See orders and recommendations

## 2014-07-28 ENCOUNTER — Ambulatory Visit (INDEPENDENT_AMBULATORY_CARE_PROVIDER_SITE_OTHER): Payer: Medicare Other | Admitting: Podiatrist

## 2014-07-28 DIAGNOSIS — M79609 Pain in unspecified limb: Secondary | ICD-10-CM | POA: Diagnosis not present

## 2014-07-28 DIAGNOSIS — M79676 Pain in unspecified toe(s): Principal | ICD-10-CM

## 2014-07-28 DIAGNOSIS — B351 Tinea unguium: Secondary | ICD-10-CM | POA: Diagnosis not present

## 2014-07-28 NOTE — Progress Notes (Signed)
HPI: Patient presents today for follow up of foot and nail care. Denies any new complaints today.  Objective: Patients chart is reviewed. Vascular status reveals pedal pulses noted at 2 out of 4 dp and pt bilateral . Neurological sensation is Normal to Semmes Weinstein monofilament bilateral. Patients nails are thickened, discolored, distrophic, friable and brittle with yellow-brown discoloration. Patient subjectively relates they are painful with shoes and with ambulation of bilateral feet.  Assessment: Symptomatic onychomycosis  Plan: Discussed treatment options and alternatives. The symptomatic toenails were debrided through manual an mechanical means without complication. Return appointment recommended at routine intervals of 3 months   

## 2014-08-03 ENCOUNTER — Encounter: Payer: Self-pay | Admitting: Cardiovascular Disease

## 2014-08-03 ENCOUNTER — Ambulatory Visit (INDEPENDENT_AMBULATORY_CARE_PROVIDER_SITE_OTHER): Payer: Medicare Other | Admitting: Cardiovascular Disease

## 2014-08-03 VITALS — BP 110/62 | HR 81 | Ht 66.0 in | Wt 178.0 lb

## 2014-08-03 DIAGNOSIS — I4891 Unspecified atrial fibrillation: Secondary | ICD-10-CM

## 2014-08-03 DIAGNOSIS — Z23 Encounter for immunization: Secondary | ICD-10-CM | POA: Diagnosis not present

## 2014-08-03 DIAGNOSIS — Z950 Presence of cardiac pacemaker: Secondary | ICD-10-CM

## 2014-08-03 DIAGNOSIS — I10 Essential (primary) hypertension: Secondary | ICD-10-CM | POA: Diagnosis not present

## 2014-08-03 DIAGNOSIS — E785 Hyperlipidemia, unspecified: Secondary | ICD-10-CM | POA: Diagnosis not present

## 2014-08-03 DIAGNOSIS — I480 Paroxysmal atrial fibrillation: Secondary | ICD-10-CM

## 2014-08-03 DIAGNOSIS — I2510 Atherosclerotic heart disease of native coronary artery without angina pectoris: Secondary | ICD-10-CM | POA: Diagnosis not present

## 2014-08-03 MED ORDER — NITROGLYCERIN 0.4 MG SL SUBL: 0.4000 mg | SUBLINGUAL_TABLET | SUBLINGUAL | Status: DC | PRN

## 2014-08-03 NOTE — Assessment & Plan Note (Signed)
Maint NSR not a candidate for anticoagulation given age and frequent falls

## 2014-08-03 NOTE — Patient Instructions (Addendum)
Your physician wants you to follow-up in:   [place] will receive a reminder letter in the mail two months in advance. If you don't receive a letter, please call our office to schedule the follow-up appointment. Your physician recommends that you continue on your current medications as directed. Please refer to the Current Medication list given to you today.

## 2014-08-03 NOTE — Assessment & Plan Note (Signed)
Cholesterol is at goal.  Continue current dose of statin and diet Rx.  No myalgias or side effects.  F/U  LFT's in 6 months. Lab Results  Component Value Date   LDLCALC 126* 02/06/2014

## 2014-08-03 NOTE — Progress Notes (Signed)
Patient ID: [name], male   DOB: 12/06/1916, 80 y.o.   MRN: [id number] [name] is a 80 year old man with symptomatic bradcardia s/p PPM implant, CAD, PAF and HTN who sees Dr Lovena Le and use to see TW . His PPM battery reached ERI in April 2014. And was replaced. 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 Has multiple illustrated books/ stories regarding his time in [employer] school.   I saw his wife today as a new patient.  They have been married for 70 year s      ROS: Denies fever, malais, weight loss, blurry vision, decreased visual acuity, cough, sputum, SOB, hemoptysis, pleuritic pain, palpitaitons, heartburn, abdominal pain, melena, lower extremity edema, claudication, or rash.  All other systems reviewed and negative  General: Affect appropriate Healthy:  appears stated age 21: hard of hearing  Neck supple with no adenopathy JVP normal no bruits no thyromegaly Lungs clear with no wheezing and good diaphragmatic motion Heart:  S1/S2 no murmur, no rub, gallop or click  Pacer under left clavicle  PMI normal Abdomen: benighn, BS positve, no tenderness, no AAA no bruit.  No HSM or HJR Distal pulses intact with no bruits No edema Neuro non-focal Skin warm and dry No muscular weakness   Current Outpatient Prescriptions  Medication Sig Dispense Refill  . alendronate (FOSAMAX) 70 MG tablet Take 70 mg by mouth once a week. Take with a full glass of water on an empty stomach.      Marland Kitchen aspirin EC 325 MG tablet Take 81 mg by mouth daily.      . benazepril (LOTENSIN) 5 MG tablet Take 2.5 mg by mouth daily.      . furosemide (LASIX) 40 MG tablet Take 20 mg by mouth daily.      . metoprolol tartrate (LOPRESSOR) 25 MG tablet Take 25 mg by  mouth 2 (two) times daily.      . Misc Natural Products (LUTEIN 20 PO) Take 1 tablet by mouth daily.       . nitroGLYCERIN (NITROSTAT) 0.4 MG SL tablet Place 1 tablet (0.4 mg total) under the tongue every 5 (five) minutes as needed.  25 tablet  1  . potassium chloride (K-DUR) 10 MEQ tablet TAKE 1 TABLET BY MOUTH DAILY  30 tablet  2  . pravastatin (PRAVACHOL) 10 MG tablet TAKE 1 TABLET (10 MG TOTAL) BY MOUTH EVERY EVENING.  90 tablet  1  . ranitidine (ZANTAC) 150 MG tablet Take 150 mg by mouth at bedtime.      . vitamin C (ASCORBIC ACID) 500 MG tablet Take 1 tablet (500 mg total) by mouth daily.       No current facility-administered medications for this visit.    Allergies  Review of patient's allergies indicates no known allergies.  Electrocardiogram:  SR rate 77 nonspecific ST changes   Assessment and Plan

## 2014-08-03 NOTE — Assessment & Plan Note (Signed)
Stable with no angina and good activity level.  Continue medical Rx New nitro called into [facility]

## 2014-08-03 NOTE — Assessment & Plan Note (Signed)
Normal function reviewed last PACEART check  Having transtelephonic check next month.  F/U Dr Lovena Le

## 2014-08-03 NOTE — Assessment & Plan Note (Signed)
Well controlled.  Continue current medications and low sodium Dash type diet.    

## 2014-08-09 ENCOUNTER — Telehealth: Payer: Self-pay | Admitting: Internal Medicine

## 2014-08-09 ENCOUNTER — Encounter: Payer: Self-pay | Admitting: Internal Medicine

## 2014-08-09 ENCOUNTER — Ambulatory Visit (INDEPENDENT_AMBULATORY_CARE_PROVIDER_SITE_OTHER): Payer: Medicare Other | Admitting: *Deleted

## 2014-08-09 DIAGNOSIS — I480 Paroxysmal atrial fibrillation: Secondary | ICD-10-CM

## 2014-08-09 NOTE — Telephone Encounter (Signed)
Daughter was given verbal instructions on how to send remote transmission. She voiced understanding.

## 2014-08-09 NOTE — Telephone Encounter (Signed)
New Message  Pt called states that he is attatched to the device to send a signal... Requests a call back to discuss how to send

## 2014-08-10 ENCOUNTER — Telehealth: Payer: Self-pay | Admitting: Cardiology

## 2014-08-10 DIAGNOSIS — I480 Paroxysmal atrial fibrillation: Secondary | ICD-10-CM

## 2014-08-10 NOTE — Progress Notes (Signed)
Remote pacemaker transmission.   

## 2014-08-10 NOTE — Telephone Encounter (Signed)
Confirmed remote transmission with pt daughter.  

## 2014-08-11 LAB — MDC_IDC_ENUM_SESS_TYPE_REMOTE
Battery Remaining Longevity: 143 mo
Battery Voltage: 2.79 V
Brady Statistic AP VS Percent: 8 %
Date Time Interrogation Session: 20151001161917
Lead Channel Impedance Value: 618 Ohm
Lead Channel Pacing Threshold Amplitude: 0.875 V
Lead Channel Pacing Threshold Amplitude: 1.375 V
Lead Channel Pacing Threshold Pulse Width: 0.4 ms
Lead Channel Pacing Threshold Pulse Width: 0.4 ms
Lead Channel Sensing Intrinsic Amplitude: 5.6 mV
Lead Channel Setting Pacing Amplitude: 2 V
Lead Channel Setting Pacing Amplitude: 2.75 V
Lead Channel Setting Pacing Pulse Width: 0.4 ms
Lead Channel Setting Sensing Sensitivity: 4 mV
MDC IDC MSMT BATTERY IMPEDANCE: 124 Ohm
MDC IDC MSMT LEADCHNL RA IMPEDANCE VALUE: 459 Ohm
MDC IDC MSMT LEADCHNL RA SENSING INTR AMPL: 1.4 mV
MDC IDC STAT BRADY AP VP PERCENT: 6 %
MDC IDC STAT BRADY AS VP PERCENT: 6 %
MDC IDC STAT BRADY AS VS PERCENT: 80 %

## 2014-08-25 ENCOUNTER — Encounter: Payer: Self-pay | Admitting: Cardiology

## 2014-08-31 DIAGNOSIS — H40013 Open angle with borderline findings, low risk, bilateral: Secondary | ICD-10-CM | POA: Diagnosis not present

## 2014-08-31 DIAGNOSIS — Z961 Presence of intraocular lens: Secondary | ICD-10-CM | POA: Diagnosis not present

## 2014-08-31 DIAGNOSIS — H02831 Dermatochalasis of right upper eyelid: Secondary | ICD-10-CM | POA: Diagnosis not present

## 2014-08-31 DIAGNOSIS — H02834 Dermatochalasis of left upper eyelid: Secondary | ICD-10-CM | POA: Diagnosis not present

## 2014-08-31 DIAGNOSIS — H3531 Nonexudative age-related macular degeneration: Secondary | ICD-10-CM | POA: Diagnosis not present

## 2014-09-05 DIAGNOSIS — H6123 Impacted cerumen, bilateral: Secondary | ICD-10-CM | POA: Diagnosis not present

## 2014-09-10 ENCOUNTER — Emergency Department (HOSPITAL_COMMUNITY): Payer: Medicare Other

## 2014-09-10 ENCOUNTER — Encounter (HOSPITAL_COMMUNITY): Payer: Self-pay | Admitting: Emergency Medicine

## 2014-09-10 ENCOUNTER — Emergency Department (HOSPITAL_COMMUNITY)
Admission: EM | Admit: 2014-09-10 | Discharge: 2014-09-11 | Disposition: A | Discharge status: Home / Self Care | Payer: Medicare Other | Attending: Emergency Medicine | Admitting: Emergency Medicine

## 2014-09-10 DIAGNOSIS — I509 Heart failure, unspecified: Secondary | ICD-10-CM | POA: Diagnosis not present

## 2014-09-10 DIAGNOSIS — W1839XA Other fall on same level, initial encounter: Secondary | ICD-10-CM | POA: Diagnosis not present

## 2014-09-10 DIAGNOSIS — S3992XA Unspecified injury of lower back, initial encounter: Secondary | ICD-10-CM | POA: Diagnosis not present

## 2014-09-10 DIAGNOSIS — M1990 Unspecified osteoarthritis, unspecified site: Secondary | ICD-10-CM | POA: Diagnosis not present

## 2014-09-10 DIAGNOSIS — Z7982 Long term (current) use of aspirin: Secondary | ICD-10-CM | POA: Insufficient documentation

## 2014-09-10 DIAGNOSIS — Y929 Unspecified place or not applicable: Secondary | ICD-10-CM | POA: Diagnosis not present

## 2014-09-10 DIAGNOSIS — Z7983 Long term (current) use of bisphosphonates: Secondary | ICD-10-CM | POA: Insufficient documentation

## 2014-09-10 DIAGNOSIS — I2510 Atherosclerotic heart disease of native coronary artery without angina pectoris: Secondary | ICD-10-CM | POA: Insufficient documentation

## 2014-09-10 DIAGNOSIS — M545 Low back pain, unspecified: Secondary | ICD-10-CM

## 2014-09-10 DIAGNOSIS — Y939 Activity, unspecified: Secondary | ICD-10-CM | POA: Insufficient documentation

## 2014-09-10 DIAGNOSIS — S3210XA Unspecified fracture of sacrum, initial encounter for closed fracture: Secondary | ICD-10-CM | POA: Diagnosis not present

## 2014-09-10 DIAGNOSIS — K219 Gastro-esophageal reflux disease without esophagitis: Secondary | ICD-10-CM | POA: Diagnosis not present

## 2014-09-10 DIAGNOSIS — I10 Essential (primary) hypertension: Secondary | ICD-10-CM | POA: Diagnosis not present

## 2014-09-10 DIAGNOSIS — Z951 Presence of aortocoronary bypass graft: Secondary | ICD-10-CM | POA: Diagnosis not present

## 2014-09-10 DIAGNOSIS — Z79899 Other long term (current) drug therapy: Secondary | ICD-10-CM | POA: Insufficient documentation

## 2014-09-10 DIAGNOSIS — W19XXXA Unspecified fall, initial encounter: Secondary | ICD-10-CM

## 2014-09-10 DIAGNOSIS — R52 Pain, unspecified: Secondary | ICD-10-CM | POA: Diagnosis not present

## 2014-09-10 DIAGNOSIS — E785 Hyperlipidemia, unspecified: Secondary | ICD-10-CM | POA: Insufficient documentation

## 2014-09-10 DIAGNOSIS — I110 Hypertensive heart disease with heart failure: Secondary | ICD-10-CM | POA: Diagnosis not present

## 2014-09-10 DIAGNOSIS — Z950 Presence of cardiac pacemaker: Secondary | ICD-10-CM | POA: Insufficient documentation

## 2014-09-10 DIAGNOSIS — K2270 Barrett's esophagus without dysplasia: Secondary | ICD-10-CM | POA: Diagnosis not present

## 2014-09-10 MED ORDER — HYDROCODONE-ACETAMINOPHEN 5-325 MG PO TABS
2.0000 | ORAL_TABLET | Freq: Once | ORAL | Status: AC
  Administered 2014-09-10: 2 via ORAL
  Filled 2014-09-10: qty 2

## 2014-09-10 NOTE — ED Provider Notes (Signed)
CSN: [id number]     Arrival date & time 09/10/14  1843 History   First MD Initiated Contact with Patient 09/10/14 1847     Chief Complaint  Patient presents with  . Fall     (Consider location/radiation/quality/duration/timing/severity/associated sxs/prior Treatment) Patient is a 80 y.o. male presenting with fall. The history is provided by the patient. No language interpreter was used.  Fall This is a new problem. The current episode started more than 2 days ago. The problem occurs constantly. The problem has been gradually worsening. Pertinent negatives include no chest pain, no abdominal pain, no headaches and no shortness of breath. Exacerbated by: sitting upright. Relieved by: laying down. Treatments tried: tylenol. The treatment provided mild relief.    Past Medical History  Diagnosis Date  . Paroxysmal atrial fibrillation   . CAD (coronary artery disease)     CABG 08/1973  . CHF (congestive heart failure)     ICM  . Pacemaker 09/2003  . Hypertension   . Hyperlipidemia   . Unsteady gait     due to B ankle DJD and instability  . Memory loss   . Chronic renal insufficiency   . Barrett's esophagus   . Diverticulosis of colon   . GERD (gastroesophageal reflux disease)   . Adenocarcinoma of prostate 10/2005    lupron injections q5mo  . Atrial fibrillation     chronic anticoag  . Arthritis     ?rheumatoid, never dx or on DMARDs  . Osteoporosis    Past Surgical History  Procedure Laterality Date  . Pacemaker placement  09/2003  . Hernia repair      age 36 ( not sure if it was right or left side)  . Coronary artery bypass graft  08/1973    3 heart heart arteries were 80 percent clogged. the lower valve of my heart is not functioning  . Doppler echocardiography  2004, 2008   History reviewed. No pertinent family history. History  Substance Use Topics  . Smoking status: Former Smoker    Quit date: 08/05/1986  . Smokeless tobacco: Not on file  . Alcohol Use: Yes   Comment: samll glass of wine    Review of Systems  Constitutional: Negative for fever, activity change, appetite change and fatigue.  HENT: Negative for congestion, facial swelling, rhinorrhea and trouble swallowing.   Eyes: Negative for photophobia and pain.  Respiratory: Negative for cough, chest tightness and shortness of breath.   Cardiovascular: Negative for chest pain and leg swelling.  Gastrointestinal: Negative for nausea, vomiting, abdominal pain, diarrhea and constipation.  Endocrine: Negative for polydipsia and polyuria.  Genitourinary: Negative for dysuria, urgency, decreased urine volume and difficulty urinating.  Musculoskeletal: Positive for back pain. Negative for gait problem.  Skin: Negative for color change, rash and wound.  Allergic/Immunologic: Negative for immunocompromised state.  Neurological: Negative for dizziness, facial asymmetry, speech difficulty, weakness, numbness and headaches.  Psychiatric/Behavioral: Negative for confusion, decreased concentration and agitation.      Allergies  Review of patient's allergies indicates no known allergies.  Home Medications   Prior to Admission medications   Medication Sig Start Date End Date Taking? Authorizing Provider  acetaminophen (TYLENOL) 325 MG tablet Take 650 mg by mouth every 6 (six) hours as needed for mild pain.   Yes Historical Provider, MD  alendronate (FOSAMAX) 70 MG tablet Take 70 mg by mouth once a week. Take with a full glass of water on an empty stomach. On Mondays   Yes Historical Provider, MD  aspirin EC 325 MG tablet Take 81 mg by mouth at bedtime.  07/06/13  Yes Rowe Clack, MD  benazepril (LOTENSIN) 5 MG tablet Take 2.5 mg by mouth daily.   Yes Historical Provider, MD  furosemide (LASIX) 40 MG tablet Take 20 mg by mouth daily. 07/06/13  Yes Rowe Clack, MD  metoprolol tartrate (LOPRESSOR) 25 MG tablet Take 25 mg by mouth 2 (two) times daily.   Yes Historical Provider, MD  Misc  Natural Products (LUTEIN 20 PO) Take 1 tablet by mouth daily.    Yes Historical Provider, MD  potassium chloride (K-DUR) 10 MEQ tablet TAKE 1 TABLET BY MOUTH DAILY 03/23/14  Yes Rowe Clack, MD  pravastatin (PRAVACHOL) 10 MG tablet TAKE 1 TABLET (10 MG TOTAL) BY MOUTH EVERY EVENING. 05/17/14  Yes Rowe Clack, MD  ranitidine (ZANTAC) 150 MG tablet Take 150 mg by mouth at bedtime.   Yes Historical Provider, MD  vitamin C (ASCORBIC ACID) 500 MG tablet Take 1 tablet (500 mg total) by mouth daily. 07/06/13  Yes Rowe Clack, MD  nitroGLYCERIN (NITROSTAT) 0.4 MG SL tablet Place 1 tablet (0.4 mg total) under the tongue every 5 (five) minutes as needed. 08/03/14   Josue Hector, MD   BP 116/57 mmHg  Pulse 74  Temp(Src) 98.2 F (36.8 C) (Oral)  Resp 18  SpO2 95% Physical Exam  Constitutional: He is oriented to person, place, and time. He appears well-developed and well-nourished. No distress.  HENT:  Head: Normocephalic and atraumatic.  Mouth/Throat: No oropharyngeal exudate.  Eyes: Pupils are equal, round, and reactive to light.  Neck: Normal range of motion. Neck supple.  Cardiovascular: Normal rate, regular rhythm and normal heart sounds.  Exam reveals no gallop and no friction rub.   No murmur heard. Pulmonary/Chest: Effort normal and breath sounds normal. No respiratory distress. He has no wheezes. He has no rales.  Abdominal: Soft. Bowel sounds are normal. He exhibits no distension and no mass. There is no tenderness. There is no rebound and no guarding.  Musculoskeletal: Normal range of motion. He exhibits no edema or tenderness.       Back:  Neurological: He is alert and oriented to person, place, and time. No cranial nerve deficit or sensory deficit. Abnormal muscle tone: see comments. GCS eye subscore is 4. GCS verbal subscore is 5. GCS motor subscore is 6.  4/5 strength BP with hip flex/ex, but nml strength with flex/ex at ankles.   Skin: Skin is warm and dry.    Psychiatric: He has a normal mood and affect.    ED Course  Procedures (including critical care time) Labs Review Labs Reviewed - No data to display  Imaging Review No results found.   EKG Interpretation None      MDM   Final diagnoses:  Low back pain  Fall from standing    Pt is a 80 y.o. male with Pmhx as above who presents with low back pain since a fall 6 days ago. Pain is gradual onset worsening. He is having significant pain while sitting up and is better while laying down. He denies any numbness, weakness, bowel or bladder incontinence, fever, saddle an esthesias. He is tenderness to palpation of lumbar spine without overlying skin changes. He has 4 out of 5 strength bilateral lower extremity is which I believe is due to pain. Sensation is normal. Flexion and extension strength at ankles is normal. Reflexes nml. +ttp low lumbar spine. CT lumbar ordered through  pelvis and showed BL minimally disp sacral ala fractures. Pt given PO norco, was able to tolerate PO and ambulate. He will be d/c'd home w/ norco for pain, will send SW referral for face-to-face SW f/u with plan for PT.         Ernestina Patches, MD 09/11/14 [phone]

## 2014-09-10 NOTE — ED Notes (Signed)
Pt from home. Normally walks w/ a cane. Fell last Monday and did not have pain x 3 days but now has lower back pain. Pain w/ movement. Alert and oriented.

## 2014-09-11 ENCOUNTER — Ambulatory Visit: Payer: Medicare Other | Admitting: Family

## 2014-09-11 MED ORDER — HYDROCODONE-ACETAMINOPHEN 5-325 MG PO TABS: 1.0000 | ORAL_TABLET | Freq: Four times a day (QID) | ORAL | Status: DC | PRN

## 2014-09-11 NOTE — Discharge Instructions (Signed)
Stable Pelvic Fracture °You have one or more fractures (this means there is a break in the bones) of the pelvis. The pelvis is the ring of bones that make up your hipbones. These are the bones you sit on and the lower part of the spine. It is like a boney ring where your legs attach and which supports your upper body. You have an undisplaced fracture. This means the bones are in good position. The pelvic fracture you have is a simple (uncomplicated) fracture. °DIAGNOSIS  °X-rays usually diagnose these fractures. °TREATMENT  °The goal of treating pelvic fractures is to get the bones to heal in a good position. The patient should return to normal activities as soon as possible. Such fractures are often treated with normal bed rest and conservative measures.  °HOME CARE INSTRUCTIONS  °· You should be on bed rest for as long as directed by your caregiver. Change positions of your legs every 1-2 hours to maintain good blood flow. You may sit as long as is tolerable. Following this, you may do usual activities, but avoid strenuous activities for as long as directed by your caregiver. °· Only take over-the-counter or prescription medicines for pain, discomfort, or fever as directed by your caregiver. °· Bed rest may also be used for discomfort. °· Resume your activities when you are able. Use a cane or crutch on the injured side to reduce pain while walking, as needed. °· If you develop increased pain or discomfort not relieved with medications, contact your caregiver. °· Warning: Do not drive a car or operate a motor vehicle until your caregiver specifically tells you it is safe to do so. °SEEK IMMEDIATE MEDICAL CARE IF:  °· You feel light-headed or faint, develop chest pain or shortness of breath. °· An unexplained oral temperature above 102° F (38.9° C) develops. °· You develop blood in the urine or in the stools. °· There is difficulty urinating, and/or having a bowel movement, or pain with these efforts. °· There is a  difficulty or increased pain with walking. °· There is swelling in one or both legs that is not normal. °Document Released: 01/05/2002 Document Revised: 03/13/2014 Document Reviewed: 06/09/2008 °ExitCare® Patient Information ©2015 [employer]. This information is not intended to replace advice given to you by your health care provider. Make sure you discuss any questions you have with your health care provider. ° °

## 2014-09-13 ENCOUNTER — Other Ambulatory Visit: Payer: Self-pay

## 2014-09-13 ENCOUNTER — Telehealth: Payer: Self-pay | Admitting: Internal Medicine

## 2014-09-13 ENCOUNTER — Encounter: Payer: Self-pay | Admitting: Cardiology

## 2014-09-13 MED ORDER — HYDROCODONE-ACETAMINOPHEN 5-325 MG PO TABS: 1.0000 | ORAL_TABLET | Freq: Four times a day (QID) | ORAL | Status: DC | PRN

## 2014-09-13 NOTE — Telephone Encounter (Signed)
Patient fell and went to [facility] on Sun. Sep 10, 2014.  They prescribed Hydrocodone-Acetamenaphen 5-325mg  with no refills.  Daughter said he has 7 tablets left and he is in great pain and needs the prescription.  Daughter also said he cannot get out of bed to get to the doctor.

## 2014-09-13 NOTE — Progress Notes (Signed)
  CARE MANAGEMENT ED NOTE 09/13/2014  Patient:  [name], [name]   Account Number:  [id number]  Date Initiated:  09/13/2014  Documentation initiated by:  Jackelyn Poling  Subjective/Objective Assessment:   80 yr old medicare [place] pt seen and d/c home after c/o pain -from home. Normally walks w/ a cane. Fell last Monday and did not have pain x 3 days but now has lower back pain. Pain w/ movement. Alert and oriented.                        Subjective/Objective Assessment Detail:   pcp Larey Brick agreed to [facility] for pt Reports she lives with her parents, her father fell one week ago in his bedroom Pt did ok until Sunday and she brought him to ED after he continued to have pain.  Pcp called when pt pain medication provided by EDP was low.  An appointment has been made to see pcp at 1030 on 09/14/14 but Magda Paganini states she does not feel she can continue to care for pt Reports her brother who lives in [place] will be in town but will not be much assistance Reports they can not afford private duty nursing services on all 3 of their retired incomes.    Magda Paganini and pt agreed to home health services using [facility] because both parents  have used the services previously and were "very satisfied"  Magda Paganini voiced appreciation of services offered cell # [phone]    Pt has a rolator in home     Action/Plan:   ED Cm consulted by ED [place], Lattie Haw after a call from Daughter Magda Paganini [place] [phone] CM spoke with daughter at 62 Cm encouraged Magda Paganini to continue to allow home health PT/OT with additional SW come out to the home to assist with evaluation &   Action/Plan Detail:   FL2 forms pcp will need to sign for placement if needed CM called in referral at 1650 to [facility] ([facility]) faxed referral to [facility] [phone] with confirmation received at [facility]   Anticipated DC Date:  09/11/2014     Status Recommendation to Physician:   Result of Recommendation:     Other ED [place]  Other  Outpatient Services - Pt will follow up  PCP issues   [facility]  HOME HEALTH   Choice offered to / List presented to:  C-4 Adult Children     HH arranged  HH-2 PT  HH-3 OT  [place].    Status of service:  Completed, signed off  ED Comments:   ED Comments Detail:

## 2014-09-13 NOTE — Telephone Encounter (Signed)
Rx printed and awaiting rx to be signed.   Daughter requested that an appointment be made with next available MD and day/time slot.   Appt made.

## 2014-09-13 NOTE — Telephone Encounter (Signed)
Patient's daughter has called back again and said no one has called her on this matter.  She said her father is getting worse by the hour.  She thinks he needs to go into a rehabilitation facility.  She would appreciate a telephone call back to her on this matter please.

## 2014-09-14 ENCOUNTER — Ambulatory Visit (INDEPENDENT_AMBULATORY_CARE_PROVIDER_SITE_OTHER): Payer: Self-pay | Admitting: Internal Medicine

## 2014-09-14 DIAGNOSIS — I5042 Chronic combined systolic (congestive) and diastolic (congestive) heart failure: Secondary | ICD-10-CM

## 2014-09-14 DIAGNOSIS — M8448XA Pathological fracture, other site, initial encounter for fracture: Secondary | ICD-10-CM

## 2014-09-14 MED ORDER — FENTANYL 25 MCG/HR TD PT72: 25.0000 ug | MEDICATED_PATCH | TRANSDERMAL | Status: DC

## 2014-09-14 MED ORDER — HYDROCODONE-ACETAMINOPHEN 10-325 MG PO TABS: 1.0000 | ORAL_TABLET | Freq: Three times a day (TID) | ORAL | Status: DC | PRN

## 2014-09-14 MED ORDER — FUROSEMIDE 40 MG PO TABS: 20.0000 mg | ORAL_TABLET | Freq: Every day | ORAL | Status: DC

## 2014-09-14 NOTE — Patient Instructions (Addendum)
It was good to see you today.  We have reviewed your prior records including labs and tests today  Medications reviewed and updated Duragesic patch every 72h and Norco as needed for breakthrough pain - No other changes recommended at this time. Refill on medication(s) as discussed today.  we'll make referral to home health and to palliative care to help with difficult situation! I will help however I can!  Stable Pelvic Fracture You have one or more fractures (this means there is a break in the bones) of the pelvis. The pelvis is the ring of bones that make up your hipbones. These are the bones you sit on and the lower part of the spine. It is like a boney ring where your legs attach and which supports your upper body. You have an undisplaced fracture. This means the bones are in good position. The pelvic fracture you have is a simple (uncomplicated) fracture. DIAGNOSIS  X-rays usually diagnose these fractures. TREATMENT  The goal of treating pelvic fractures is to get the bones to heal in a good position. The patient should return to normal activities as soon as possible. Such fractures are often treated with normal bed rest and conservative measures.  HOME CARE INSTRUCTIONS   You should be on bed rest for as long as directed by your caregiver. Change positions of your legs every 1-2 hours to maintain good blood flow. You may sit as long as is tolerable. Following this, you may do usual activities, but avoid strenuous activities for as long as directed by your caregiver.  Only take over-the-counter or prescription medicines for pain, discomfort, or fever as directed by your caregiver.  Bed rest may also be used for discomfort.  Resume your activities when you are able. Use a cane or crutch on the injured side to reduce pain while walking, as needed.  If you develop increased pain or discomfort not relieved with medications, contact your caregiver.  Warning: Do not drive a car or operate  a motor vehicle until your caregiver specifically tells you it is safe to do so. SEEK IMMEDIATE MEDICAL CARE IF:   You feel light-headed or faint, develop chest pain or shortness of breath.  An unexplained oral temperature above 102 F (38.9 C) develops.  You develop blood in the urine or in the stools.  There is difficulty urinating, and/or having a bowel movement, or pain with these efforts.  There is a difficulty or increased pain with walking.  There is swelling in one or both legs that is not normal. Document Released: 01/05/2002 Document Revised: 03/13/2014 Document Reviewed: 06/09/2008 [facility] Patient Information 2015 [employer] [place]. This information is not intended to replace advice given to you by your health care provider. Make sure you discuss any questions you have with your health care provider.

## 2014-09-14 NOTE — Progress Notes (Signed)
1436 Returned call to dtr, Magda Paganini to f/u with her. Reported had not heard from [facility] staff for 09/14/14 visit.  CM offered to call [facility] to f/u on services Cm spoke with Verdene Lennert at [facility] [phone] to find out pt is scheduled to be seen by PT on 09/15/14 and possibly by OT and SW Informed they generally call between 7-9 am to patient's homes.  Magda Paganini reported her brother from [place] came in 09/14/14 to assist pt to bathroom but pt requested to return to bed half way to the bathroom.  No bedside commode. Magda Paganini voiced concern about not being able to get pt to bedside commode by herself (medical disability, strength) Cm called Dr Asa Lente office at 1444 because Magda Paganini stated she would go there to get a bedpan if there was one,  no available bedpans per cheryl.  Cm called [facility] near zip [phone] and none in stock x 2 CM called [facility] at [address], [place], [place] [address] [phone] and spoke with Jenny Reichmann at 1500 who confirms there is one bedpan available for $11.49 CM updated Magda Paganini of this at 1513. Magda Paganini states she would go get bed pan for pt

## 2014-09-14 NOTE — Progress Notes (Signed)
Received a call from Dtr, Magda Paganini She has not heard form [facility] staff and wanted assist with coordinating schedule for today CM called Kristen at [facility] to ask who was scheduled to arrive today and to inform that person that pt has a 1030 pcp appt and may not return home until about noon.  Magda Paganini updated and will keep her cell close by in anticipation of HHPT staff call to meet them at home.

## 2014-09-14 NOTE — Progress Notes (Signed)
Subjective:    Patient ID: [name], male    DOB: June 28, 1917, 80 y.o.   MRN: [id number]  HPI  Family here without patient  - (no [employer] account planner) Reviewed chronic medical issues and interval medical events with patient  Past Medical History  Diagnosis Date  . Paroxysmal atrial fibrillation   . CAD (coronary artery disease)     CABG 08/1973  . CHF (congestive heart failure)     ICM  . Pacemaker 09/2003  . Hypertension   . Hyperlipidemia   . Unsteady gait     due to B ankle DJD and instability  . Memory loss   . Chronic renal insufficiency   . Barrett's esophagus   . Diverticulosis of colon   . GERD (gastroesophageal reflux disease)   . Adenocarcinoma of prostate 10/2005    lupron injections q9mo  . Atrial fibrillation     chronic anticoag  . Arthritis     ?rheumatoid, never dx or on DMARDs  . Osteoporosis     Review of Systems  Respiratory: Negative for cough and shortness of breath.   Cardiovascular: Negative for chest pain and leg swelling.  Musculoskeletal: Positive for back pain and gait problem. Negative for joint swelling.       Objective:   Physical Exam  There were no vitals taken for this visit. Wt Readings from Last 3 Encounters:  09/10/14 175 lb (79.379 kg)  08/03/14 178 lb (80.74 kg)  07/05/14 180 lb 4 oz (81.761 kg)   Pt not examined   Lab Results  Component Value Date   WBC 11.7* 03/16/2014   HGB 13.4 03/16/2014   HCT 40.3 03/16/2014   PLT 198 03/16/2014   GLUCOSE 110* 03/16/2014   CHOL 199 02/06/2014   TRIG 135.0 02/06/2014   HDL 45.90 02/06/2014   LDLDIRECT 112.5 02/03/2013   LDLCALC 126* 02/06/2014   ALT 23 02/03/2013   AST 24 02/03/2013   NA 142 03/16/2014   K 4.8 03/16/2014   CL 106 03/16/2014   CREATININE 1.03 03/16/2014   BUN 23 03/16/2014   CO2 22 03/16/2014   TSH 3.95 11/11/2012   INR 1.9 03/29/2014    Ct Lumbar Spine Wo Contrast  09/10/2014   CLINICAL DATA:  Fall and complains of severe low back pain.  EXAM:  CT LUMBAR SPINE WITHOUT CONTRAST  TECHNIQUE: Multidetector CT imaging of the lumbar spine was performed without intravenous contrast administration. Multiplanar CT image reconstructions were also generated.  COMPARISON:  03/16/2014 and 01/05/2009  FINDINGS: The abdominal aorta measures at least 3.6 cm but incompletely imaged. Degenerative facet disease in the lower lumbar spine. There is no evidence for an acute fracture in the lumbar spine. There is an old compression fracture at T12. Vacuum disc phenomena at L1-L2. There are fractures involving the sacral ala on both sides. Fractures are mildly displaced. There may be a small amount of presacral edema suggesting acute sacral fractures. Sacral fractures are minimally displaced on the sagittal reformats, sequence 7, image 27.  IMPRESSION: Bilateral sacral fractures.  Fractures are minimally displaced.  Multilevel degenerative changes in lumbar spine. No acute bone abnormality in the lumbar spine.  Old compression deformity at T12.  Abdominal aortic aneurysm measuring at least 3.6 cm.   Electronically Signed   By: Markus Daft M.D.   On: 09/10/2014 21:26       Assessment & Plan:   Support offered to family -  Strategy to arrange [facility] and SNF vs Pall care at  home Refer for same and pt/family to choose best option

## 2014-09-14 NOTE — Assessment & Plan Note (Signed)
Chronic ischemic CM without exacerbation symptoms stable but report Follows with cardiology annually, no indication for a followup echocardiogram at this time given asymptomatic dz and advanced age The current medical regimen is effective;  continue present plan and medications.

## 2014-09-15 ENCOUNTER — Telehealth: Payer: Self-pay

## 2014-09-15 ENCOUNTER — Other Ambulatory Visit: Payer: Self-pay | Admitting: Internal Medicine

## 2014-09-15 DIAGNOSIS — M545 Low back pain: Secondary | ICD-10-CM | POA: Diagnosis not present

## 2014-09-15 DIAGNOSIS — I129 Hypertensive chronic kidney disease with stage 1 through stage 4 chronic kidney disease, or unspecified chronic kidney disease: Secondary | ICD-10-CM | POA: Diagnosis not present

## 2014-09-15 DIAGNOSIS — C61 Malignant neoplasm of prostate: Secondary | ICD-10-CM | POA: Diagnosis not present

## 2014-09-15 DIAGNOSIS — M1990 Unspecified osteoarthritis, unspecified site: Secondary | ICD-10-CM | POA: Diagnosis not present

## 2014-09-15 DIAGNOSIS — I482 Chronic atrial fibrillation: Secondary | ICD-10-CM | POA: Diagnosis not present

## 2014-09-15 DIAGNOSIS — I2510 Atherosclerotic heart disease of native coronary artery without angina pectoris: Secondary | ICD-10-CM | POA: Diagnosis not present

## 2014-09-15 DIAGNOSIS — I509 Heart failure, unspecified: Secondary | ICD-10-CM | POA: Diagnosis not present

## 2014-09-15 DIAGNOSIS — N189 Chronic kidney disease, unspecified: Secondary | ICD-10-CM | POA: Diagnosis not present

## 2014-09-15 DIAGNOSIS — Z9181 History of falling: Secondary | ICD-10-CM | POA: Diagnosis not present

## 2014-09-15 NOTE — Telephone Encounter (Signed)
Spoke to [place] and stated that she had spoke to daughter during phone consult.   Daughter stated that her goals are not Hospice goals. [facility]) stated that there was not a terminal. Pain is the only thing that is out of control and daughter wants him to back to the way he was before he feel. With that information Jeani Hawking suggested that ER may need to reevaluated the pain that pt is having.   Pt is no longer admitted with [facility] because the pain was out of control and pt is not able to get out of bed. That will need to be re instated after the pain is under control.   [phone] Dr. Konrad Dolores with Hospice.

## 2014-09-15 NOTE — Telephone Encounter (Signed)
Course noted Thanks for update on this difficult situation

## 2014-09-15 NOTE — Telephone Encounter (Signed)
Pts home health nurse [facility] called with concerns about pt. Broke sacrum in 2 places was doing better but now declining since Wednesday. Is unable to bare any weight on pelvis area and has been bed ridden for a few days. Wants advice on whether to call ambulance to transport him to the hospital. Please advise.

## 2014-09-15 NOTE — Telephone Encounter (Signed)
Spoke to pt daughter Magda Paganini. I informed her that it would be her decision to take pt to hospital.   Informed daughter that Hospice has been contacted and let me know if she didn't hear from them by 3pm today to let me know.

## 2014-09-15 NOTE — Telephone Encounter (Signed)
Pallative Care consult.

## 2014-09-16 ENCOUNTER — Emergency Department (HOSPITAL_COMMUNITY): Payer: Medicare Other

## 2014-09-16 ENCOUNTER — Emergency Department (HOSPITAL_COMMUNITY)
Admission: EM | Admit: 2014-09-16 | Discharge: 2014-09-16 | Disposition: A | Discharge status: Home / Self Care | Payer: Medicare Other | Attending: Emergency Medicine | Admitting: Emergency Medicine

## 2014-09-16 ENCOUNTER — Encounter (HOSPITAL_COMMUNITY): Payer: Self-pay | Admitting: Emergency Medicine

## 2014-09-16 DIAGNOSIS — S3790XA Unspecified injury of unspecified urinary and pelvic organ, initial encounter: Secondary | ICD-10-CM | POA: Diagnosis not present

## 2014-09-16 DIAGNOSIS — I10 Essential (primary) hypertension: Secondary | ICD-10-CM | POA: Diagnosis not present

## 2014-09-16 DIAGNOSIS — N189 Chronic kidney disease, unspecified: Secondary | ICD-10-CM | POA: Diagnosis not present

## 2014-09-16 DIAGNOSIS — Z951 Presence of aortocoronary bypass graft: Secondary | ICD-10-CM | POA: Insufficient documentation

## 2014-09-16 DIAGNOSIS — Z7982 Long term (current) use of aspirin: Secondary | ICD-10-CM | POA: Insufficient documentation

## 2014-09-16 DIAGNOSIS — Z79899 Other long term (current) drug therapy: Secondary | ICD-10-CM | POA: Insufficient documentation

## 2014-09-16 DIAGNOSIS — S3992XA Unspecified injury of lower back, initial encounter: Secondary | ICD-10-CM | POA: Diagnosis not present

## 2014-09-16 DIAGNOSIS — Z8639 Personal history of other endocrine, nutritional and metabolic disease: Secondary | ICD-10-CM | POA: Diagnosis not present

## 2014-09-16 DIAGNOSIS — M25552 Pain in left hip: Secondary | ICD-10-CM | POA: Diagnosis not present

## 2014-09-16 DIAGNOSIS — Z950 Presence of cardiac pacemaker: Secondary | ICD-10-CM | POA: Diagnosis not present

## 2014-09-16 DIAGNOSIS — M545 Low back pain, unspecified: Secondary | ICD-10-CM

## 2014-09-16 DIAGNOSIS — M25551 Pain in right hip: Secondary | ICD-10-CM | POA: Diagnosis not present

## 2014-09-16 DIAGNOSIS — Z87891 Personal history of nicotine dependence: Secondary | ICD-10-CM | POA: Insufficient documentation

## 2014-09-16 DIAGNOSIS — M1990 Unspecified osteoarthritis, unspecified site: Secondary | ICD-10-CM | POA: Insufficient documentation

## 2014-09-16 DIAGNOSIS — M549 Dorsalgia, unspecified: Secondary | ICD-10-CM | POA: Diagnosis not present

## 2014-09-16 DIAGNOSIS — K219 Gastro-esophageal reflux disease without esophagitis: Secondary | ICD-10-CM | POA: Diagnosis not present

## 2014-09-16 DIAGNOSIS — Z8546 Personal history of malignant neoplasm of prostate: Secondary | ICD-10-CM | POA: Insufficient documentation

## 2014-09-16 DIAGNOSIS — R52 Pain, unspecified: Secondary | ICD-10-CM

## 2014-09-16 DIAGNOSIS — I2510 Atherosclerotic heart disease of native coronary artery without angina pectoris: Secondary | ICD-10-CM | POA: Insufficient documentation

## 2014-09-16 DIAGNOSIS — M62830 Muscle spasm of back: Secondary | ICD-10-CM

## 2014-09-16 DIAGNOSIS — I129 Hypertensive chronic kidney disease with stage 1 through stage 4 chronic kidney disease, or unspecified chronic kidney disease: Secondary | ICD-10-CM | POA: Insufficient documentation

## 2014-09-16 DIAGNOSIS — M25559 Pain in unspecified hip: Secondary | ICD-10-CM | POA: Diagnosis not present

## 2014-09-16 DIAGNOSIS — I509 Heart failure, unspecified: Secondary | ICD-10-CM | POA: Insufficient documentation

## 2014-09-16 MED ORDER — PREDNISONE 20 MG PO TABS: 40.0000 mg | ORAL_TABLET | Freq: Every day | ORAL | Status: DC

## 2014-09-16 MED ORDER — PREDNISONE 20 MG PO TABS
40.0000 mg | ORAL_TABLET | Freq: Once | ORAL | Status: AC
  Administered 2014-09-16: 40 mg via ORAL
  Filled 2014-09-16: qty 2

## 2014-09-16 MED ORDER — CYCLOBENZAPRINE HCL 10 MG PO TABS: 10.0000 mg | ORAL_TABLET | Freq: Two times a day (BID) | ORAL | Status: DC | PRN

## 2014-09-16 MED ORDER — CYCLOBENZAPRINE HCL 10 MG PO TABS
10.0000 mg | ORAL_TABLET | ORAL | Status: AC
  Administered 2014-09-16: 10 mg via ORAL
  Filled 2014-09-16: qty 1

## 2014-09-16 MED ORDER — OXYCODONE-ACETAMINOPHEN 5-325 MG PO TABS
1.0000 | ORAL_TABLET | ORAL | Status: AC
  Administered 2014-09-16: 1 via ORAL
  Filled 2014-09-16: qty 1

## 2014-09-16 NOTE — ED Notes (Signed)
[facility] contacted for transport home.  

## 2014-09-16 NOTE — Discharge Instructions (Signed)
Please follow the directions provided.  Be sure to follow-up with Dr. Asa Lente as needed regarding today's visit.  Take the prednisone daily and the flexeril as needed for pain.  Call his home health resources on Monday morning to get them established.  If his pain persists, don't hesitate to return.  SEEK IMMEDIATE MEDICAL CARE IF:  You have pain that radiates from your back into your legs.  You develop new bowel or bladder control problems.  You have unusual weakness or numbness in your arms or legs.  You develop nausea or vomiting.  You develop abdominal pain.  You feel faint.

## 2014-09-16 NOTE — ED Notes (Signed)
Patient transported to X-ray 

## 2014-09-16 NOTE — ED Notes (Addendum)
80 yo- [facility] with bilateral hip pain from Fx Sacrum. Recently here 2 weeks ago for fall had sacrum fx. Family wants pt here to evaluate pain med control. Pt currently on Norco 10-325 and Fentyl patch 50 mcg. Pain 0 lying but 8/10 standing. + pulses bilateral. A/O x3. HX PPM and Hypotension.

## 2014-09-16 NOTE — ED Notes (Addendum)
NP at bedside.

## 2014-09-16 NOTE — ED Provider Notes (Signed)
CSN: [id number]     Arrival date & time 09/16/14  1324 History   First MD Initiated Contact with Patient 09/16/14 1327     Chief Complaint  Patient presents with  . Hip Pain    (Consider location/radiation/quality/duration/timing/severity/associated sxs/prior Treatment) HPI [name] is a 80 yo male presenting with continued pain since being diagnosed with stable sacral fracture.  Family reports pain when changing position from lying down to sitting up to standing.  His family followed up with his primary MD who increased pain meds and instructed to come to the ED if pain persists till today.  He denies any new injury, saddle parastheisa, incontinence or bowel or bladder or numbness or tingling in lower extremities.   Past Medical History  Diagnosis Date  . Paroxysmal atrial fibrillation   . CAD (coronary artery disease)     CABG 08/1973  . CHF (congestive heart failure)     ICM  . Pacemaker 09/2003  . Hypertension   . Hyperlipidemia   . Unsteady gait     due to B ankle DJD and instability  . Memory loss   . Chronic renal insufficiency   . Barrett's esophagus   . Diverticulosis of colon   . GERD (gastroesophageal reflux disease)   . Adenocarcinoma of prostate 10/2005    lupron injections q46mo  . Atrial fibrillation     chronic anticoag  . Arthritis     ?rheumatoid, never dx or on DMARDs  . Osteoporosis    Past Surgical History  Procedure Laterality Date  . Pacemaker placement  09/2003  . Hernia repair      age 48 ( not sure if it was right or left side)  . Coronary artery bypass graft  08/1973    3 heart heart arteries were 80 percent clogged. the lower valve of my heart is not functioning  . Doppler echocardiography  2004, 2008   No family history on file. History  Substance Use Topics  . Smoking status: Former Smoker    Quit date: 08/05/1986  . Smokeless tobacco: Not on file  . Alcohol Use: Yes     Comment: samll glass of wine    Review of Systems    Constitutional: Negative for fever and chills.  HENT: Negative for sore throat.   Eyes: Negative for visual disturbance.  Respiratory: Negative for cough and shortness of breath.   Cardiovascular: Negative for chest pain and leg swelling.  Gastrointestinal: Negative for nausea, vomiting and diarrhea.  Genitourinary: Negative for dysuria.  Musculoskeletal: Positive for back pain, arthralgias and gait problem. Negative for myalgias.  Skin: Negative for rash.  Neurological: Negative for weakness, numbness and headaches.    Allergies  Review of patient's allergies indicates no known allergies.  Home Medications   Prior to Admission medications   Medication Sig Start Date End Date Taking? Authorizing Provider  acetaminophen (TYLENOL) 325 MG tablet Take 650 mg by mouth every 6 (six) hours as needed for mild pain.    Historical Provider, MD  aspirin EC 325 MG tablet Take 81 mg by mouth at bedtime.  07/06/13   Rowe Clack, MD  benazepril (LOTENSIN) 5 MG tablet Take 2.5 mg by mouth daily.    Historical Provider, MD  fentaNYL (DURAGESIC) 25 MCG/HR patch Place 1 patch (25 mcg total) onto the skin every 3 (three) days. 09/14/14   Rowe Clack, MD  furosemide (LASIX) 40 MG tablet Take 0.5 tablets (20 mg total) by mouth daily. 09/14/14  Rowe Clack, MD  HYDROcodone-acetaminophen (NORCO) 10-325 MG per tablet Take 1 tablet by mouth every 8 (eight) hours as needed. 09/14/14   Rowe Clack, MD  Misc Natural Products (LUTEIN 20 PO) Take 1 tablet by mouth daily.     Historical Provider, MD  nitroGLYCERIN (NITROSTAT) 0.4 MG SL tablet Place 1 tablet (0.4 mg total) under the tongue every 5 (five) minutes as needed. 08/03/14   Josue Hector, MD  potassium chloride (K-DUR) 10 MEQ tablet TAKE 1 TABLET BY MOUTH DAILY 03/23/14   Rowe Clack, MD  pravastatin (PRAVACHOL) 10 MG tablet TAKE 1 TABLET (10 MG TOTAL) BY MOUTH EVERY EVENING. 05/17/14   Rowe Clack, MD  ranitidine (ZANTAC)  150 MG tablet Take 150 mg by mouth at bedtime.    Historical Provider, MD  vitamin C (ASCORBIC ACID) 500 MG tablet Take 1 tablet (500 mg total) by mouth daily. 07/06/13   Rowe Clack, MD   BP 100/69 mmHg  Pulse 71  Temp(Src) 97.8 F (36.6 C) (Oral)  Resp 15  SpO2 97% Physical Exam  Constitutional: He is oriented to person, place, and time. He appears well-developed and well-nourished. No distress.  HENT:  Head: Normocephalic and atraumatic.  Mouth/Throat: Oropharynx is clear and moist. No oropharyngeal exudate.  Eyes: Conjunctivae are normal.  Neck: Neck supple. No thyromegaly present.  Cardiovascular: Normal rate, regular rhythm and intact distal pulses.   Pulmonary/Chest: Effort normal and breath sounds normal. No respiratory distress. He has no wheezes. He has no rales. He exhibits no tenderness.  Abdominal: Soft. There is no tenderness.  Musculoskeletal: He exhibits tenderness.       Lumbar back: He exhibits tenderness. He exhibits normal range of motion and no bony tenderness.       Back:  Lymphadenopathy:    He has no cervical adenopathy.  Neurological: He is alert and oriented to person, place, and time. No cranial nerve deficit. Coordination normal.  Skin: Skin is warm and dry. No rash noted. He is not diaphoretic.  Psychiatric: He has a normal mood and affect.  Nursing note and vitals reviewed.   ED Course  Procedures (including critical care time) Labs Review Labs Reviewed - No data to display  Imaging Review Dg Pelvis 1-2 Views  09/16/2014   CLINICAL DATA:  80 year old male with bilateral hip pain. Recently diagnosed with sacral fracture the after falling 2 weeks ago.  EXAM: PELVIS - 1-2 VIEW  COMPARISON:  Recent CT scan of the lumbar spine 09/10/2014 ; pelvic radiographs 03/16/2014  FINDINGS: Limited pelvic radiograph secondary 2 profound osteopenia. The nondisplaced right sacral fracture is not visible by conventional radiography. No definite displaced  fracture or malalignment identified. Degenerative disc disease in the visualized lower lumbar spine. The bowel gas pattern is unremarkable.  IMPRESSION: The recently identified nondisplaced right sacral fracture is not visible by conventional radiography. Evaluation for fracture is limited by significant osteopenia.  No new displaced fracture or malalignment identified.  Lower lumbar degenerative disc disease.   Electronically Signed   By: Jacqulynn Cadet M.D.   On: 09/16/2014 15:56     EKG Interpretation   Date/Time:  Saturday September 16 2014 13:28:15 EST Ventricular Rate:  86 PR Interval:  206 QRS Duration: 117 QT Interval:  373 QTC Calculation: 446 R Axis:   -6 Text Interpretation:  Sinus arrhythmia Nonspecific intraventricular  conduction delay Inferior infarct, age indeterminate Consider anterior  infarct Lateral leads are also involved Confirmed by Beau Fanny  MD, Nathaneil Canary  (  [id number]) on 09/16/2014 1:31:56 PM      MDM   Final diagnoses:  Pain  Back muscle spasm  Bilateral low back pain without sciatica   80 yo male, well appearing, alert and oriented with continued pain after diagnosis of stable sacral fracture. He is cared for at home by his adult children who report significant pain with ADLs that require position change. Exam shows no bony tenderness over spine but reproducible pain and  tenderness to palpation over bilat SI joints.  Pelvic xray negative for fracture or dislocation.  Pain managed in the ED.  Discussed with pt and the family, in light of no acute fracture, will treat pain with anti-inflammatory and muscle relaxant.  Pt alert and no acute distress and vital signs stable. Discharge instructions include contacting home health resources to implement recommendations, following up with ortho if pain improved but persists and return to ED if pain unchanged or symptoms worsen.  Pt and family aware of plan and return precautions provided.     Filed Vitals:   09/16/14 1345  09/16/14 1400 09/16/14 1430 09/16/14 1725  BP: 100/54 102/60 110/60 107/64  Pulse: 72 71 70 69  Temp:    97.5 F (36.4 C)  TempSrc:    Oral  Resp: 14 17 14 16   SpO2: 93% 95% 96% 95%   Meds given in ED:  Medications  cyclobenzaprine (FLEXERIL) tablet 10 mg (10 mg Oral Given 09/16/14 1505)  oxyCODONE-acetaminophen (PERCOCET/ROXICET) 5-325 MG per tablet 1 tablet (1 tablet Oral Given 09/16/14 1505)  predniSONE (DELTASONE) tablet 40 mg (40 mg Oral Given 09/16/14 1648)    Discharge Medication List as of 09/16/2014  4:55 PM    START taking these medications   Details  cyclobenzaprine (FLEXERIL) 10 MG tablet Take 1 tablet (10 mg total) by mouth 2 (two) times daily as needed for muscle spasms., Starting 09/16/2014, Until Discontinued, Print    predniSONE (DELTASONE) 20 MG tablet Take 2 tablets (40 mg total) by mouth daily., Starting 09/16/2014, Until Discontinued, Print           Britt Bottom, NP 09/16/14 2046  Veryl Speak, MD 09/17/14 [phone]

## 2014-09-18 DIAGNOSIS — I482 Chronic atrial fibrillation: Secondary | ICD-10-CM | POA: Diagnosis not present

## 2014-09-18 DIAGNOSIS — M545 Low back pain: Secondary | ICD-10-CM | POA: Diagnosis not present

## 2014-09-18 DIAGNOSIS — I509 Heart failure, unspecified: Secondary | ICD-10-CM | POA: Diagnosis not present

## 2014-09-18 DIAGNOSIS — I2510 Atherosclerotic heart disease of native coronary artery without angina pectoris: Secondary | ICD-10-CM | POA: Diagnosis not present

## 2014-09-18 DIAGNOSIS — M1990 Unspecified osteoarthritis, unspecified site: Secondary | ICD-10-CM | POA: Diagnosis not present

## 2014-09-18 DIAGNOSIS — C61 Malignant neoplasm of prostate: Secondary | ICD-10-CM | POA: Diagnosis not present

## 2014-09-19 DIAGNOSIS — I482 Chronic atrial fibrillation: Secondary | ICD-10-CM | POA: Diagnosis not present

## 2014-09-19 DIAGNOSIS — C61 Malignant neoplasm of prostate: Secondary | ICD-10-CM | POA: Diagnosis not present

## 2014-09-19 DIAGNOSIS — I2510 Atherosclerotic heart disease of native coronary artery without angina pectoris: Secondary | ICD-10-CM | POA: Diagnosis not present

## 2014-09-19 DIAGNOSIS — I509 Heart failure, unspecified: Secondary | ICD-10-CM | POA: Diagnosis not present

## 2014-09-19 DIAGNOSIS — M545 Low back pain: Secondary | ICD-10-CM | POA: Diagnosis not present

## 2014-09-19 DIAGNOSIS — M1990 Unspecified osteoarthritis, unspecified site: Secondary | ICD-10-CM | POA: Diagnosis not present

## 2014-09-20 DIAGNOSIS — M1990 Unspecified osteoarthritis, unspecified site: Secondary | ICD-10-CM | POA: Diagnosis not present

## 2014-09-20 DIAGNOSIS — I482 Chronic atrial fibrillation: Secondary | ICD-10-CM | POA: Diagnosis not present

## 2014-09-20 DIAGNOSIS — C61 Malignant neoplasm of prostate: Secondary | ICD-10-CM | POA: Diagnosis not present

## 2014-09-20 DIAGNOSIS — I2510 Atherosclerotic heart disease of native coronary artery without angina pectoris: Secondary | ICD-10-CM | POA: Diagnosis not present

## 2014-09-20 DIAGNOSIS — M545 Low back pain: Secondary | ICD-10-CM | POA: Diagnosis not present

## 2014-09-20 DIAGNOSIS — I509 Heart failure, unspecified: Secondary | ICD-10-CM | POA: Diagnosis not present

## 2014-09-21 ENCOUNTER — Telehealth: Payer: Self-pay | Admitting: Internal Medicine

## 2014-09-21 ENCOUNTER — Ambulatory Visit: Payer: Medicare Other | Admitting: Family

## 2014-09-21 NOTE — Telephone Encounter (Signed)
Called pt daughter.   States: not able to most bowels. Pt is taking stool softeners and drinking a lot of fluids.   Meds: hydrocodoine 10-325mg  TID, Fentanyl 62mcg (changed every 3 days) and cyclobenzaprine 10 mg BID  Stool softeners are given every time he takes his medicine.  Good sense stool softeners is being used (gel capsule) and Miralax.    Pt has had a good appetite. Eating 3 times a day and eating well.   Suggested to do only miralax and with meds until a bowel movement has happened and increase fluid intake to 64 oz per day.

## 2014-09-21 NOTE — Telephone Encounter (Signed)
[facility] called in said that pt is having problems going to the bathrooms due to meds.  She was asking if a nurse could call pt and give advice on what may help him

## 2014-09-22 DIAGNOSIS — I2510 Atherosclerotic heart disease of native coronary artery without angina pectoris: Secondary | ICD-10-CM | POA: Diagnosis not present

## 2014-09-22 DIAGNOSIS — I509 Heart failure, unspecified: Secondary | ICD-10-CM | POA: Diagnosis not present

## 2014-09-22 DIAGNOSIS — C61 Malignant neoplasm of prostate: Secondary | ICD-10-CM | POA: Diagnosis not present

## 2014-09-22 DIAGNOSIS — M1990 Unspecified osteoarthritis, unspecified site: Secondary | ICD-10-CM | POA: Diagnosis not present

## 2014-09-22 DIAGNOSIS — I482 Chronic atrial fibrillation: Secondary | ICD-10-CM | POA: Diagnosis not present

## 2014-09-22 DIAGNOSIS — M545 Low back pain: Secondary | ICD-10-CM | POA: Diagnosis not present

## 2014-09-25 ENCOUNTER — Telehealth: Payer: Self-pay

## 2014-09-25 ENCOUNTER — Other Ambulatory Visit: Payer: Self-pay

## 2014-09-25 ENCOUNTER — Telehealth: Payer: Self-pay | Admitting: Internal Medicine

## 2014-09-25 DIAGNOSIS — I509 Heart failure, unspecified: Secondary | ICD-10-CM | POA: Diagnosis not present

## 2014-09-25 DIAGNOSIS — M1990 Unspecified osteoarthritis, unspecified site: Secondary | ICD-10-CM | POA: Diagnosis not present

## 2014-09-25 DIAGNOSIS — I482 Chronic atrial fibrillation: Secondary | ICD-10-CM | POA: Diagnosis not present

## 2014-09-25 DIAGNOSIS — I2510 Atherosclerotic heart disease of native coronary artery without angina pectoris: Secondary | ICD-10-CM | POA: Diagnosis not present

## 2014-09-25 DIAGNOSIS — C61 Malignant neoplasm of prostate: Secondary | ICD-10-CM | POA: Diagnosis not present

## 2014-09-25 DIAGNOSIS — M545 Low back pain: Secondary | ICD-10-CM | POA: Diagnosis not present

## 2014-09-25 MED ORDER — CYCLOBENZAPRINE HCL 10 MG PO TABS: 10.0000 mg | ORAL_TABLET | Freq: Two times a day (BID) | ORAL | Status: DC | PRN

## 2014-09-25 MED ORDER — HYDROCODONE-ACETAMINOPHEN 10-325 MG PO TABS: 1.0000 | ORAL_TABLET | Freq: Three times a day (TID) | ORAL | Status: DC | PRN

## 2014-09-25 NOTE — Telephone Encounter (Signed)
It would appear from last office visit that he has fentanyl patch, Norco for breakthrough pain. Given the current regimen would be reluctant to refill Flexeril however if it is helping him quite significantly can refill and advise not concurrent usage with Norco.

## 2014-09-25 NOTE — Telephone Encounter (Signed)
Agree, done

## 2014-09-25 NOTE — Telephone Encounter (Signed)
Spoke to pt daughter and she extends a Thank you for the Flexeril and is also asking if pt could have another rx for Norco. Pt will be out Wednesday afternoon.

## 2014-09-25 NOTE — Telephone Encounter (Signed)
Magda Paganini, daughter called to update. Pt now has cramps in back of thighs, very painful when attempting to walk. Currently on Flexeril but almost out, does he need something different or more of that?

## 2014-09-26 DIAGNOSIS — I2510 Atherosclerotic heart disease of native coronary artery without angina pectoris: Secondary | ICD-10-CM | POA: Diagnosis not present

## 2014-09-26 DIAGNOSIS — I482 Chronic atrial fibrillation: Secondary | ICD-10-CM | POA: Diagnosis not present

## 2014-09-26 DIAGNOSIS — M1990 Unspecified osteoarthritis, unspecified site: Secondary | ICD-10-CM | POA: Diagnosis not present

## 2014-09-26 DIAGNOSIS — I509 Heart failure, unspecified: Secondary | ICD-10-CM | POA: Diagnosis not present

## 2014-09-26 DIAGNOSIS — C61 Malignant neoplasm of prostate: Secondary | ICD-10-CM | POA: Diagnosis not present

## 2014-09-26 DIAGNOSIS — M545 Low back pain: Secondary | ICD-10-CM | POA: Diagnosis not present

## 2014-09-27 ENCOUNTER — Telehealth: Payer: Self-pay

## 2014-09-27 NOTE — Telephone Encounter (Signed)
[name] from [facility] called and noticed an increase resp rate for pt. Resp went from 18 - 20. There is a decrease in urine output. [name] is requesting Skilled Nursing to be called out to the pt home. [phone]

## 2014-09-28 DIAGNOSIS — I2510 Atherosclerotic heart disease of native coronary artery without angina pectoris: Secondary | ICD-10-CM | POA: Diagnosis not present

## 2014-09-28 DIAGNOSIS — M545 Low back pain: Secondary | ICD-10-CM | POA: Diagnosis not present

## 2014-09-28 DIAGNOSIS — M1990 Unspecified osteoarthritis, unspecified site: Secondary | ICD-10-CM | POA: Diagnosis not present

## 2014-09-28 DIAGNOSIS — I482 Chronic atrial fibrillation: Secondary | ICD-10-CM | POA: Diagnosis not present

## 2014-09-28 DIAGNOSIS — I509 Heart failure, unspecified: Secondary | ICD-10-CM | POA: Diagnosis not present

## 2014-09-28 DIAGNOSIS — C61 Malignant neoplasm of prostate: Secondary | ICD-10-CM | POA: Diagnosis not present

## 2014-09-28 NOTE — Telephone Encounter (Signed)
LVM for [facility] to call me back.

## 2014-09-28 NOTE — Telephone Encounter (Signed)
Verbal ok as requested

## 2014-09-28 NOTE — Telephone Encounter (Signed)
[name] care called to see  if pt can take stop hydrocodone and start acetomenophen (see med list) every 6 hours 500 mg [phone]

## 2014-09-29 DIAGNOSIS — I2510 Atherosclerotic heart disease of native coronary artery without angina pectoris: Secondary | ICD-10-CM | POA: Diagnosis not present

## 2014-09-29 DIAGNOSIS — M1990 Unspecified osteoarthritis, unspecified site: Secondary | ICD-10-CM | POA: Diagnosis not present

## 2014-09-29 DIAGNOSIS — C61 Malignant neoplasm of prostate: Secondary | ICD-10-CM | POA: Diagnosis not present

## 2014-09-29 DIAGNOSIS — I509 Heart failure, unspecified: Secondary | ICD-10-CM | POA: Diagnosis not present

## 2014-09-29 DIAGNOSIS — M545 Low back pain: Secondary | ICD-10-CM | POA: Diagnosis not present

## 2014-09-29 DIAGNOSIS — I482 Chronic atrial fibrillation: Secondary | ICD-10-CM | POA: Diagnosis not present

## 2014-09-29 NOTE — Telephone Encounter (Signed)
[facility] called back. Gave MD verbal okay for Skilled Nursing assessment.

## 2014-10-02 DIAGNOSIS — C61 Malignant neoplasm of prostate: Secondary | ICD-10-CM | POA: Diagnosis not present

## 2014-10-02 DIAGNOSIS — M1990 Unspecified osteoarthritis, unspecified site: Secondary | ICD-10-CM | POA: Diagnosis not present

## 2014-10-02 DIAGNOSIS — M545 Low back pain: Secondary | ICD-10-CM | POA: Diagnosis not present

## 2014-10-02 DIAGNOSIS — I2510 Atherosclerotic heart disease of native coronary artery without angina pectoris: Secondary | ICD-10-CM | POA: Diagnosis not present

## 2014-10-02 DIAGNOSIS — I509 Heart failure, unspecified: Secondary | ICD-10-CM | POA: Diagnosis not present

## 2014-10-02 DIAGNOSIS — I482 Chronic atrial fibrillation: Secondary | ICD-10-CM | POA: Diagnosis not present

## 2014-10-03 DIAGNOSIS — I509 Heart failure, unspecified: Secondary | ICD-10-CM | POA: Diagnosis not present

## 2014-10-03 DIAGNOSIS — C61 Malignant neoplasm of prostate: Secondary | ICD-10-CM | POA: Diagnosis not present

## 2014-10-03 DIAGNOSIS — M1990 Unspecified osteoarthritis, unspecified site: Secondary | ICD-10-CM | POA: Diagnosis not present

## 2014-10-03 DIAGNOSIS — M545 Low back pain: Secondary | ICD-10-CM | POA: Diagnosis not present

## 2014-10-03 DIAGNOSIS — I482 Chronic atrial fibrillation: Secondary | ICD-10-CM | POA: Diagnosis not present

## 2014-10-03 DIAGNOSIS — I2510 Atherosclerotic heart disease of native coronary artery without angina pectoris: Secondary | ICD-10-CM | POA: Diagnosis not present

## 2014-10-04 DIAGNOSIS — I509 Heart failure, unspecified: Secondary | ICD-10-CM | POA: Diagnosis not present

## 2014-10-04 DIAGNOSIS — M545 Low back pain: Secondary | ICD-10-CM | POA: Diagnosis not present

## 2014-10-04 DIAGNOSIS — I2510 Atherosclerotic heart disease of native coronary artery without angina pectoris: Secondary | ICD-10-CM | POA: Diagnosis not present

## 2014-10-04 DIAGNOSIS — C61 Malignant neoplasm of prostate: Secondary | ICD-10-CM | POA: Diagnosis not present

## 2014-10-04 DIAGNOSIS — M1990 Unspecified osteoarthritis, unspecified site: Secondary | ICD-10-CM | POA: Diagnosis not present

## 2014-10-04 DIAGNOSIS — I482 Chronic atrial fibrillation: Secondary | ICD-10-CM | POA: Diagnosis not present

## 2014-10-06 DIAGNOSIS — I482 Chronic atrial fibrillation: Secondary | ICD-10-CM | POA: Diagnosis not present

## 2014-10-06 DIAGNOSIS — I2510 Atherosclerotic heart disease of native coronary artery without angina pectoris: Secondary | ICD-10-CM | POA: Diagnosis not present

## 2014-10-06 DIAGNOSIS — M545 Low back pain: Secondary | ICD-10-CM | POA: Diagnosis not present

## 2014-10-06 DIAGNOSIS — M1990 Unspecified osteoarthritis, unspecified site: Secondary | ICD-10-CM | POA: Diagnosis not present

## 2014-10-06 DIAGNOSIS — I509 Heart failure, unspecified: Secondary | ICD-10-CM | POA: Diagnosis not present

## 2014-10-06 DIAGNOSIS — C61 Malignant neoplasm of prostate: Secondary | ICD-10-CM | POA: Diagnosis not present

## 2014-10-09 DIAGNOSIS — I482 Chronic atrial fibrillation: Secondary | ICD-10-CM | POA: Diagnosis not present

## 2014-10-09 DIAGNOSIS — C61 Malignant neoplasm of prostate: Secondary | ICD-10-CM | POA: Diagnosis not present

## 2014-10-09 DIAGNOSIS — M545 Low back pain: Secondary | ICD-10-CM | POA: Diagnosis not present

## 2014-10-09 DIAGNOSIS — I2510 Atherosclerotic heart disease of native coronary artery without angina pectoris: Secondary | ICD-10-CM | POA: Diagnosis not present

## 2014-10-09 DIAGNOSIS — I509 Heart failure, unspecified: Secondary | ICD-10-CM | POA: Diagnosis not present

## 2014-10-09 DIAGNOSIS — M1990 Unspecified osteoarthritis, unspecified site: Secondary | ICD-10-CM | POA: Diagnosis not present

## 2014-10-10 DIAGNOSIS — I482 Chronic atrial fibrillation: Secondary | ICD-10-CM | POA: Diagnosis not present

## 2014-10-10 DIAGNOSIS — C61 Malignant neoplasm of prostate: Secondary | ICD-10-CM | POA: Diagnosis not present

## 2014-10-10 DIAGNOSIS — I509 Heart failure, unspecified: Secondary | ICD-10-CM | POA: Diagnosis not present

## 2014-10-10 DIAGNOSIS — M1990 Unspecified osteoarthritis, unspecified site: Secondary | ICD-10-CM | POA: Diagnosis not present

## 2014-10-10 DIAGNOSIS — M545 Low back pain: Secondary | ICD-10-CM | POA: Diagnosis not present

## 2014-10-10 DIAGNOSIS — I2510 Atherosclerotic heart disease of native coronary artery without angina pectoris: Secondary | ICD-10-CM | POA: Diagnosis not present

## 2014-10-11 ENCOUNTER — Telehealth: Payer: Self-pay | Admitting: Internal Medicine

## 2014-10-11 NOTE — Telephone Encounter (Incomplete)
Home health nurse called in and is requ     [facility] best number [phone]

## 2014-10-12 DIAGNOSIS — M545 Low back pain: Secondary | ICD-10-CM | POA: Diagnosis not present

## 2014-10-12 DIAGNOSIS — I482 Chronic atrial fibrillation: Secondary | ICD-10-CM | POA: Diagnosis not present

## 2014-10-12 DIAGNOSIS — I509 Heart failure, unspecified: Secondary | ICD-10-CM | POA: Diagnosis not present

## 2014-10-12 DIAGNOSIS — I2510 Atherosclerotic heart disease of native coronary artery without angina pectoris: Secondary | ICD-10-CM | POA: Diagnosis not present

## 2014-10-12 DIAGNOSIS — C61 Malignant neoplasm of prostate: Secondary | ICD-10-CM | POA: Diagnosis not present

## 2014-10-12 DIAGNOSIS — M1990 Unspecified osteoarthritis, unspecified site: Secondary | ICD-10-CM | POA: Diagnosis not present

## 2014-10-13 DIAGNOSIS — M545 Low back pain: Secondary | ICD-10-CM | POA: Diagnosis not present

## 2014-10-13 DIAGNOSIS — M1990 Unspecified osteoarthritis, unspecified site: Secondary | ICD-10-CM | POA: Diagnosis not present

## 2014-10-13 DIAGNOSIS — I482 Chronic atrial fibrillation: Secondary | ICD-10-CM | POA: Diagnosis not present

## 2014-10-13 DIAGNOSIS — C61 Malignant neoplasm of prostate: Secondary | ICD-10-CM | POA: Diagnosis not present

## 2014-10-13 DIAGNOSIS — I2510 Atherosclerotic heart disease of native coronary artery without angina pectoris: Secondary | ICD-10-CM | POA: Diagnosis not present

## 2014-10-13 DIAGNOSIS — I509 Heart failure, unspecified: Secondary | ICD-10-CM | POA: Diagnosis not present

## 2014-10-17 ENCOUNTER — Ambulatory Visit: Payer: Medicare Other | Admitting: Internal Medicine

## 2014-10-18 DIAGNOSIS — I509 Heart failure, unspecified: Secondary | ICD-10-CM | POA: Diagnosis not present

## 2014-10-18 DIAGNOSIS — M1990 Unspecified osteoarthritis, unspecified site: Secondary | ICD-10-CM | POA: Diagnosis not present

## 2014-10-18 DIAGNOSIS — C61 Malignant neoplasm of prostate: Secondary | ICD-10-CM | POA: Diagnosis not present

## 2014-10-18 DIAGNOSIS — I2510 Atherosclerotic heart disease of native coronary artery without angina pectoris: Secondary | ICD-10-CM | POA: Diagnosis not present

## 2014-10-18 DIAGNOSIS — I482 Chronic atrial fibrillation: Secondary | ICD-10-CM | POA: Diagnosis not present

## 2014-10-18 DIAGNOSIS — M545 Low back pain: Secondary | ICD-10-CM | POA: Diagnosis not present

## 2014-10-19 ENCOUNTER — Encounter (HOSPITAL_COMMUNITY): Payer: Self-pay | Admitting: Internal Medicine

## 2014-10-20 DIAGNOSIS — M1990 Unspecified osteoarthritis, unspecified site: Secondary | ICD-10-CM | POA: Diagnosis not present

## 2014-10-20 DIAGNOSIS — I509 Heart failure, unspecified: Secondary | ICD-10-CM | POA: Diagnosis not present

## 2014-10-20 DIAGNOSIS — C61 Malignant neoplasm of prostate: Secondary | ICD-10-CM | POA: Diagnosis not present

## 2014-10-20 DIAGNOSIS — I2510 Atherosclerotic heart disease of native coronary artery without angina pectoris: Secondary | ICD-10-CM | POA: Diagnosis not present

## 2014-10-20 DIAGNOSIS — I482 Chronic atrial fibrillation: Secondary | ICD-10-CM | POA: Diagnosis not present

## 2014-10-20 DIAGNOSIS — M545 Low back pain: Secondary | ICD-10-CM | POA: Diagnosis not present

## 2014-10-24 ENCOUNTER — Ambulatory Visit (INDEPENDENT_AMBULATORY_CARE_PROVIDER_SITE_OTHER): Payer: Medicare Other | Admitting: Internal Medicine

## 2014-10-24 ENCOUNTER — Other Ambulatory Visit (INDEPENDENT_AMBULATORY_CARE_PROVIDER_SITE_OTHER): Payer: Medicare Other

## 2014-10-24 ENCOUNTER — Encounter: Payer: Self-pay | Admitting: Internal Medicine

## 2014-10-24 VITALS — BP 118/68 | HR 86 | Temp 98.1°F | Resp 18 | Ht 66.5 in | Wt 161.6 lb

## 2014-10-24 DIAGNOSIS — R269 Unspecified abnormalities of gait and mobility: Secondary | ICD-10-CM | POA: Diagnosis not present

## 2014-10-24 DIAGNOSIS — M545 Low back pain, unspecified: Secondary | ICD-10-CM

## 2014-10-24 DIAGNOSIS — I2510 Atherosclerotic heart disease of native coronary artery without angina pectoris: Secondary | ICD-10-CM | POA: Diagnosis not present

## 2014-10-24 LAB — BASIC METABOLIC PANEL
BUN: 25 mg/dL — ABNORMAL HIGH (ref 6–23)
CO2: 24 meq/L (ref 19–32)
CREATININE: 1.1 mg/dL (ref 0.4–1.5)
Calcium: 9.2 mg/dL (ref 8.4–10.5)
Chloride: 99 mEq/L (ref 96–112)
GFR: 66.05 mL/min (ref >60.00)
GLUCOSE: 126 mg/dL — AB (ref 70–99)
Potassium: 4.4 mEq/L (ref 3.5–5.1)
Sodium: 132 mEq/L — ABNORMAL LOW (ref 135–145)

## 2014-10-24 LAB — CBC
HCT: 44.2 % (ref 39.0–52.0)
Hemoglobin: 14.5 g/dL (ref 13.0–17.0)
MCHC: 32.8 g/dL (ref 30.0–36.0)
MCV: 88.8 fl (ref 78.0–100.0)
PLATELETS: 312 10*3/uL (ref 150.0–400.0)
RBC: 4.98 Mil/uL (ref 4.22–5.81)
RDW: 15.6 % — AB (ref 11.5–15.5)
WBC: 11.3 10*3/uL — ABNORMAL HIGH (ref 4.0–10.5)

## 2014-10-24 LAB — MAGNESIUM: Magnesium: 2.4 mg/dL (ref 1.5–2.5)

## 2014-10-24 MED ORDER — MIRTAZAPINE 7.5 MG PO TABS: 7.5000 mg | ORAL_TABLET | Freq: Every day | ORAL | Status: DC

## 2014-10-24 NOTE — Progress Notes (Signed)
Pre visit review using our clinic review tool, if applicable. No additional management support is needed unless otherwise documented below in the visit note. 

## 2014-10-24 NOTE — Progress Notes (Signed)
   Subjective:    Patient ID: [name], male    DOB: [birth year], 80 y.o.   MRN: [id number]  HPI The patient is a 80 year old man who comes in today for a ER follow-up for back pain. He was given a short course of prednisone as well as Flexeril for the back pain. He has also had recent pelvic fracture in the last month. He is getting around better at home however his daughter is still concerned that his appetite is not very good. He is finally starting to gain a little bit of weight back. He is doing his exercises in the morning however she thinks he should be doing them twice a day. He denies any new complaints and is off all narcotics.  Review of Systems  Constitutional: Positive for activity change, appetite change and fatigue. Negative for fever and chills.  Respiratory: Negative for cough, chest tightness and shortness of breath.   Cardiovascular: Negative for chest pain, palpitations and leg swelling.  Musculoskeletal: Positive for gait problem. Negative for back pain and arthralgias.  Neurological: Positive for weakness. Negative for dizziness, syncope, light-headedness, numbness and headaches.      Objective:   Physical Exam  Constitutional: He appears well-developed and well-nourished.  HENT:  Head: Normocephalic and atraumatic.  Eyes: EOM are normal.  Neck: Normal range of motion.  Cardiovascular: Normal rate.   Pulmonary/Chest: Effort normal and breath sounds normal. No respiratory distress.  Abdominal: Soft. Bowel sounds are normal.  Neurological: He is alert. Coordination abnormal.  Slow gait uses walker.  Skin: Skin is warm and dry.   Filed Vitals:   10/24/14 1352  BP: 118/68  Pulse: 86  Temp: 98.1 F (36.7 C)  TempSrc: Oral  Resp: 18  Height: 5' 6.5" (1.689 m)  Weight: 161 lb 9.6 oz (73.301 kg)  SpO2: 95%      Assessment & Plan:

## 2014-10-24 NOTE — Patient Instructions (Signed)
We had sent in a medication that may help with appetite. It is called mirtazapine, take 1 pill about 30 minutes before bedtime every day. It may make you a little bit sleepy or drowsy.  We are going to check your blood work today and will call you back with the results.  Continue to take the boost drinks daily and keep working on doing exercises and being more and more active as you are able.

## 2014-10-25 NOTE — Assessment & Plan Note (Signed)
Check BMP, blood pressure well controlled at this time.

## 2014-10-25 NOTE — Assessment & Plan Note (Signed)
Patient using walker, doing okay. Stamina not good, he continues his exercises at home. Given lack of appetite, will start Remeron at nighttime.

## 2014-10-27 ENCOUNTER — Ambulatory Visit (INDEPENDENT_AMBULATORY_CARE_PROVIDER_SITE_OTHER): Payer: Medicare Other | Admitting: Podiatrist

## 2014-10-27 ENCOUNTER — Encounter: Payer: Self-pay | Admitting: Podiatrist

## 2014-10-27 DIAGNOSIS — B351 Tinea unguium: Secondary | ICD-10-CM | POA: Diagnosis not present

## 2014-10-27 DIAGNOSIS — M79676 Pain in unspecified toe(s): Secondary | ICD-10-CM

## 2014-10-27 NOTE — Progress Notes (Signed)
HPI: Patient presents today for follow up of foot and nail care. Denies any new complaints today.  Objective: Patients chart is reviewed. Vascular status reveals pedal pulses noted at 2 out of 4 dp and pt bilateral . Neurological sensation is Normal to Semmes Weinstein monofilament bilateral. Patients nails are thickened, discolored, distrophic, friable and brittle with yellow-brown discoloration. Patient subjectively relates they are painful with shoes and with ambulation of bilateral feet.  Assessment: Symptomatic onychomycosis  Plan: Discussed treatment options and alternatives. The symptomatic toenails were debrided through manual an mechanical means without complication. Return appointment recommended at routine intervals of 3 months   

## 2014-11-04 ENCOUNTER — Inpatient Hospital Stay (HOSPITAL_COMMUNITY)
Admission: EM | Admit: 2014-11-04 | Discharge: 2014-11-06 | DRG: 069 | Disposition: A | Discharge status: Home Health | Payer: Medicare Other | Attending: Family Medicine | Admitting: Family Medicine

## 2014-11-04 ENCOUNTER — Emergency Department (HOSPITAL_COMMUNITY): Payer: Medicare Other

## 2014-11-04 ENCOUNTER — Encounter (HOSPITAL_COMMUNITY): Payer: Self-pay | Admitting: Emergency Medicine

## 2014-11-04 DIAGNOSIS — Z951 Presence of aortocoronary bypass graft: Secondary | ICD-10-CM

## 2014-11-04 DIAGNOSIS — R740 Nonspecific elevation of levels of transaminase and lactic acid dehydrogenase [LDH]: Secondary | ICD-10-CM | POA: Diagnosis present

## 2014-11-04 DIAGNOSIS — Z7982 Long term (current) use of aspirin: Secondary | ICD-10-CM

## 2014-11-04 DIAGNOSIS — I129 Hypertensive chronic kidney disease with stage 1 through stage 4 chronic kidney disease, or unspecified chronic kidney disease: Secondary | ICD-10-CM | POA: Diagnosis present

## 2014-11-04 DIAGNOSIS — I959 Hypotension, unspecified: Secondary | ICD-10-CM | POA: Diagnosis present

## 2014-11-04 DIAGNOSIS — Z950 Presence of cardiac pacemaker: Secondary | ICD-10-CM

## 2014-11-04 DIAGNOSIS — R7989 Other specified abnormal findings of blood chemistry: Secondary | ICD-10-CM

## 2014-11-04 DIAGNOSIS — Z87891 Personal history of nicotine dependence: Secondary | ICD-10-CM | POA: Diagnosis not present

## 2014-11-04 DIAGNOSIS — E785 Hyperlipidemia, unspecified: Secondary | ICD-10-CM | POA: Diagnosis present

## 2014-11-04 DIAGNOSIS — M1990 Unspecified osteoarthritis, unspecified site: Secondary | ICD-10-CM | POA: Diagnosis present

## 2014-11-04 DIAGNOSIS — I2510 Atherosclerotic heart disease of native coronary artery without angina pectoris: Secondary | ICD-10-CM | POA: Diagnosis present

## 2014-11-04 DIAGNOSIS — M25511 Pain in right shoulder: Secondary | ICD-10-CM | POA: Diagnosis present

## 2014-11-04 DIAGNOSIS — N189 Chronic kidney disease, unspecified: Secondary | ICD-10-CM | POA: Diagnosis present

## 2014-11-04 DIAGNOSIS — I639 Cerebral infarction, unspecified: Secondary | ICD-10-CM | POA: Insufficient documentation

## 2014-11-04 DIAGNOSIS — I6523 Occlusion and stenosis of bilateral carotid arteries: Secondary | ICD-10-CM | POA: Diagnosis not present

## 2014-11-04 DIAGNOSIS — K219 Gastro-esophageal reflux disease without esophagitis: Secondary | ICD-10-CM | POA: Diagnosis present

## 2014-11-04 DIAGNOSIS — R221 Localized swelling, mass and lump, neck: Secondary | ICD-10-CM | POA: Diagnosis not present

## 2014-11-04 DIAGNOSIS — R42 Dizziness and giddiness: Secondary | ICD-10-CM | POA: Diagnosis not present

## 2014-11-04 DIAGNOSIS — R296 Repeated falls: Secondary | ICD-10-CM | POA: Diagnosis present

## 2014-11-04 DIAGNOSIS — R29898 Other symptoms and signs involving the musculoskeletal system: Secondary | ICD-10-CM | POA: Insufficient documentation

## 2014-11-04 DIAGNOSIS — I10 Essential (primary) hypertension: Secondary | ICD-10-CM | POA: Diagnosis not present

## 2014-11-04 DIAGNOSIS — G459 Transient cerebral ischemic attack, unspecified: Principal | ICD-10-CM | POA: Diagnosis present

## 2014-11-04 DIAGNOSIS — Z8546 Personal history of malignant neoplasm of prostate: Secondary | ICD-10-CM | POA: Diagnosis not present

## 2014-11-04 DIAGNOSIS — R748 Abnormal levels of other serum enzymes: Secondary | ICD-10-CM | POA: Diagnosis present

## 2014-11-04 DIAGNOSIS — M79601 Pain in right arm: Secondary | ICD-10-CM

## 2014-11-04 DIAGNOSIS — Z79899 Other long term (current) drug therapy: Secondary | ICD-10-CM | POA: Diagnosis not present

## 2014-11-04 DIAGNOSIS — M810 Age-related osteoporosis without current pathological fracture: Secondary | ICD-10-CM | POA: Diagnosis present

## 2014-11-04 DIAGNOSIS — R778 Other specified abnormalities of plasma proteins: Secondary | ICD-10-CM

## 2014-11-04 DIAGNOSIS — I369 Nonrheumatic tricuspid valve disorder, unspecified: Secondary | ICD-10-CM | POA: Diagnosis not present

## 2014-11-04 DIAGNOSIS — Z8249 Family history of ischemic heart disease and other diseases of the circulatory system: Secondary | ICD-10-CM

## 2014-11-04 DIAGNOSIS — I4891 Unspecified atrial fibrillation: Secondary | ICD-10-CM | POA: Diagnosis present

## 2014-11-04 DIAGNOSIS — I5042 Chronic combined systolic (congestive) and diastolic (congestive) heart failure: Secondary | ICD-10-CM | POA: Diagnosis not present

## 2014-11-04 DIAGNOSIS — R7401 Elevation of levels of liver transaminase levels: Secondary | ICD-10-CM

## 2014-11-04 DIAGNOSIS — I480 Paroxysmal atrial fibrillation: Secondary | ICD-10-CM | POA: Diagnosis present

## 2014-11-04 DIAGNOSIS — I63111 Cerebral infarction due to embolism of right vertebral artery: Secondary | ICD-10-CM

## 2014-11-04 DIAGNOSIS — M19011 Primary osteoarthritis, right shoulder: Secondary | ICD-10-CM | POA: Diagnosis not present

## 2014-11-04 LAB — HEPATIC FUNCTION PANEL
ALBUMIN: 3.4 g/dL — AB (ref 3.5–5.2)
ALT: 112 U/L — AB (ref 0–53)
AST: 91 U/L — AB (ref 0–37)
Alkaline Phosphatase: 299 U/L — ABNORMAL HIGH (ref 39–117)
BILIRUBIN DIRECT: 0.2 mg/dL (ref 0.0–0.3)
Indirect Bilirubin: 0.4 mg/dL (ref 0.3–0.9)
TOTAL PROTEIN: 6.1 g/dL (ref 6.0–8.3)
Total Bilirubin: 0.6 mg/dL (ref 0.3–1.2)

## 2014-11-04 LAB — RAPID URINE DRUG SCREEN, HOSP PERFORMED
AMPHETAMINES: NOT DETECTED
BENZODIAZEPINES: NOT DETECTED
Barbiturates: NOT DETECTED
Cocaine: NOT DETECTED
Opiates: NOT DETECTED
Tetrahydrocannabinol: NOT DETECTED

## 2014-11-04 LAB — PROTIME-INR
INR: 1.02 (ref 0.00–1.49)
Prothrombin Time: 13.5 seconds (ref 11.6–15.2)

## 2014-11-04 LAB — I-STAT CHEM 8, ED
BUN: 18 mg/dL (ref 6–23)
CREATININE: 1 mg/dL (ref 0.50–1.35)
Calcium, Ion: 1.15 mmol/L (ref 1.13–1.30)
Chloride: 104 mEq/L (ref 96–112)
Glucose, Bld: 87 mg/dL (ref 70–99)
HCT: 36 % — ABNORMAL LOW (ref 39.0–52.0)
HEMOGLOBIN: 12.2 g/dL — AB (ref 13.0–17.0)
POTASSIUM: 4.5 mmol/L (ref 3.5–5.1)
SODIUM: 138 mmol/L (ref 135–145)
TCO2: 25 mmol/L (ref 0–100)

## 2014-11-04 LAB — CBC WITH DIFFERENTIAL/PLATELET
Basophils Absolute: 0.1 10*3/uL (ref 0.0–0.1)
Basophils Relative: 1 % (ref 0–1)
Eosinophils Absolute: 0.5 10*3/uL (ref 0.0–0.7)
Eosinophils Relative: 6 % — ABNORMAL HIGH (ref 0–5)
HCT: 40.2 % (ref 39.0–52.0)
HEMOGLOBIN: 12.9 g/dL — AB (ref 13.0–17.0)
LYMPHS ABS: 1.9 10*3/uL (ref 0.7–4.0)
Lymphocytes Relative: 24 % (ref 12–46)
MCH: 29.9 pg (ref 26.0–34.0)
MCHC: 32.1 g/dL (ref 30.0–36.0)
MCV: 93.1 fL (ref 78.0–100.0)
MONOS PCT: 9 % (ref 3–12)
Monocytes Absolute: 0.7 10*3/uL (ref 0.1–1.0)
NEUTROS ABS: 4.6 10*3/uL (ref 1.7–7.7)
Neutrophils Relative %: 60 % (ref 43–77)
PLATELETS: 264 10*3/uL (ref 150–400)
RBC: 4.32 MIL/uL (ref 4.22–5.81)
RDW: 16.3 % — ABNORMAL HIGH (ref 11.5–15.5)
WBC: 7.8 10*3/uL (ref 4.0–10.5)

## 2014-11-04 LAB — URINALYSIS, ROUTINE W REFLEX MICROSCOPIC
Bilirubin Urine: NEGATIVE
GLUCOSE, UA: NEGATIVE mg/dL
Hgb urine dipstick: NEGATIVE
KETONES UR: NEGATIVE mg/dL
Leukocytes, UA: NEGATIVE
NITRITE: NEGATIVE
Protein, ur: NEGATIVE mg/dL
SPECIFIC GRAVITY, URINE: 1.015 (ref 1.005–1.030)
Urobilinogen, UA: 0.2 mg/dL (ref 0.0–1.0)
pH: 5.5 (ref 5.0–8.0)

## 2014-11-04 LAB — BASIC METABOLIC PANEL
ANION GAP: 6 (ref 5–15)
BUN: 17 mg/dL (ref 6–23)
CHLORIDE: 107 meq/L (ref 96–112)
CO2: 27 mmol/L (ref 19–32)
Calcium: 9.2 mg/dL (ref 8.4–10.5)
Creatinine, Ser: 1.05 mg/dL (ref 0.50–1.35)
GFR calc Af Amer: 67 mL/min — ABNORMAL LOW (ref >90)
GFR, EST NON AFRICAN AMERICAN: 58 mL/min — AB (ref >90)
Glucose, Bld: 145 mg/dL — ABNORMAL HIGH (ref 70–99)
Potassium: 4.2 mmol/L (ref 3.5–5.1)
SODIUM: 140 mmol/L (ref 135–145)

## 2014-11-04 LAB — APTT: aPTT: 32 seconds (ref 24–37)

## 2014-11-04 LAB — I-STAT TROPONIN, ED: Troponin i, poc: 0.16 ng/mL (ref 0.00–0.08)

## 2014-11-04 LAB — TROPONIN I: Troponin I: 0.22 ng/mL — ABNORMAL HIGH (ref <0.031)

## 2014-11-04 LAB — ETHANOL

## 2014-11-04 MED ORDER — CLOPIDOGREL BISULFATE 75 MG PO TABS
75.0000 mg | ORAL_TABLET | Freq: Every day | ORAL | Status: DC
  Administered 2014-11-04 – 2014-11-06 (×3): 75 mg via ORAL
  Filled 2014-11-04 (×3): qty 1

## 2014-11-04 MED ORDER — MIRTAZAPINE 15 MG PO TABS
7.5000 mg | ORAL_TABLET | Freq: Every day | ORAL | Status: DC
  Administered 2014-11-05: 7.5 mg via ORAL
  Filled 2014-11-04 (×4): qty 0.5

## 2014-11-04 MED ORDER — ENSURE COMPLETE PO LIQD
237.0000 mL | Freq: Two times a day (BID) | ORAL | Status: DC
Start: 2014-11-05 — End: 2014-11-06
  Administered 2014-11-05 – 2014-11-06 (×4): 237 mL via ORAL

## 2014-11-04 MED ORDER — HEPARIN SODIUM (PORCINE) 5000 UNIT/ML IJ SOLN
5000.0000 [IU] | Freq: Three times a day (TID) | INTRAMUSCULAR | Status: DC
  Administered 2014-11-04 – 2014-11-06 (×7): 5000 [IU] via SUBCUTANEOUS
  Filled 2014-11-04 (×7): qty 1

## 2014-11-04 MED ORDER — MAGNESIUM 500 MG PO CAPS: 1.0000 | ORAL_CAPSULE | Freq: Every day | ORAL | Status: DC

## 2014-11-04 MED ORDER — MAGNESIUM OXIDE 400 (241.3 MG) MG PO TABS
400.0000 mg | ORAL_TABLET | Freq: Every day | ORAL | Status: DC
  Administered 2014-11-04 – 2014-11-06 (×3): 400 mg via ORAL
  Filled 2014-11-04 (×3): qty 1

## 2014-11-04 MED ORDER — SODIUM CHLORIDE 0.9 % IV SOLN
INTRAVENOUS | Status: AC
  Administered 2014-11-04: 17:00:00 via INTRAVENOUS

## 2014-11-04 MED ORDER — PRAVASTATIN SODIUM 10 MG PO TABS
10.0000 mg | ORAL_TABLET | Freq: Every day | ORAL | Status: DC
  Administered 2014-11-04 – 2014-11-05 (×2): 10 mg via ORAL
  Filled 2014-11-04 (×3): qty 1

## 2014-11-04 MED ORDER — FAMOTIDINE 20 MG PO TABS
20.0000 mg | ORAL_TABLET | Freq: Every day | ORAL | Status: DC
  Administered 2014-11-04 – 2014-11-05 (×2): 20 mg via ORAL
  Filled 2014-11-04 (×2): qty 1

## 2014-11-04 MED ORDER — STROKE: EARLY STAGES OF RECOVERY BOOK
Freq: Once | Status: AC
  Administered 2014-11-05: 18:00:00
  Filled 2014-11-04: qty 1

## 2014-11-04 MED ORDER — SENNOSIDES-DOCUSATE SODIUM 8.6-50 MG PO TABS
1.0000 | ORAL_TABLET | Freq: Every evening | ORAL | Status: DC | PRN
  Filled 2014-11-04: qty 1

## 2014-11-04 MED ORDER — FAMOTIDINE 20 MG PO TABS: 10.0000 mg | ORAL_TABLET | Freq: Every day | ORAL | Status: DC

## 2014-11-04 NOTE — H&P (Signed)
[facility]          History and Physical    PCP:   Gwendolyn Grant, MD   Chief Complaint:  Right arm pain and numbness, dizziness  HPI: Patient is a 80 year old gentleman with multiple medical problems including history of A. fib and not on chronic anticoagulation, coronary artery disease, chronic combined CHF, status post permanent pacer, hypertension, hyperlipidemia who recently suffered a fall and had a sacral fracture. Patient's wife and daughter are present and daughters gives most of the history. Daughter states that patient has been recovering well from his fall, he woke up this morning and proceeded to do his exercises in bed which include lifting his legs repeatedly which he had no trouble doing. He later went to the bathroom to complete his ablutions and when he came downstairs was a little unsteady on his feet and told the daughter that he was a bit dizzy. They proceeded to get into the car and come to [place] for a Christmas lunch with patient's son. On the way here patient started noticed that he was clenching and unclenching his right fist. When questioned patient said that his right arm had been hurting him and was numb and tingling. He was brought to the hospital for evaluation. Onset of symptoms was around 9:00 this morning. His symptoms have improved although still not resolved. Initial CT scan of the head does not show any acute findings, unfortunately we are unable to perform an MRI given his pacer. We have been asked to admit him for further evaluation and management.  Allergies:  No Known Allergies    Past Medical History  Diagnosis Date  . Paroxysmal atrial fibrillation   . CAD (coronary artery disease)     CABG 08/1973  . CHF (congestive heart failure)     ICM  . Pacemaker 09/2003  . Hypertension   . Hyperlipidemia   . Unsteady gait     due to B ankle DJD and instability  . Memory loss   . Chronic renal insufficiency   . Barrett's  esophagus   . Diverticulosis of colon   . GERD (gastroesophageal reflux disease)   . Adenocarcinoma of prostate 10/2005    lupron injections q69mo . Atrial fibrillation     chronic anticoag  . Arthritis     ?rheumatoid, never dx or on DMARDs  . Osteoporosis     Past Surgical History  Procedure Laterality Date  . Pacemaker placement  09/2003  . Hernia repair      age 80( not sure if it was right or left side)  . Coronary artery bypass graft  08/1973    3 heart heart arteries were 80 percent clogged. the lower valve of my heart is not functioning  . Doppler echocardiography  2004, 2008  . Pacemaker generator change N/A 05/02/2013    Procedure: PACEMAKER GENERATOR CHANGE;  Surgeon: GEvans Lance MD;  [facility];  Service: Cardiovascular;  Laterality: N/A;    Prior to Admission medications   Medication Sig Start Date End Date Taking? Authorizing Provider  acetaminophen (TYLENOL) 325 MG tablet Take 650 mg by mouth every 6 (six) hours as needed for mild pain.   Yes Historical Provider, MD  aspirin EC 325 MG tablet Take 81 mg by mouth at bedtime.  07/06/13  Yes VRowe Clack MD  benazepril (LOTENSIN) 5 MG tablet Take 2.5 mg by mouth  daily.   Yes Historical Provider, MD  furosemide (LASIX) 40 MG tablet Take 20 mg by mouth daily as needed for fluid or edema.   Yes Historical Provider, MD  Magnesium 500 MG CAPS Take 1 capsule by mouth daily. Take one by mouth daily   Yes Historical Provider, MD  mirtazapine (REMERON) 7.5 MG tablet Take 1 tablet (7.5 mg total) by mouth at bedtime. 10/24/14  Yes Olga Millers, MD  Misc Natural Products (LUTEIN 20 PO) Take 1 tablet by mouth daily.    Yes Historical Provider, MD  nitroGLYCERIN (NITROSTAT) 0.4 MG SL tablet Place 1 tablet (0.4 mg total) under the tongue every 5 (five) minutes as needed. 08/03/14  Yes Josue Hector, MD  potassium chloride SA (K-DUR,KLOR-CON) 20 MEQ tablet Take 20 mEq by mouth daily as needed (take with lasix).    Yes Historical Provider, MD  pravastatin (PRAVACHOL) 10 MG tablet TAKE 1 TABLET (10 MG TOTAL) BY MOUTH EVERY EVENING. 09/18/14  Yes Rowe Clack, MD  ranitidine (ZANTAC) 150 MG tablet Take 150 mg by mouth at bedtime.   Yes Historical Provider, MD  vitamin C (ASCORBIC ACID) 500 MG tablet Take 1 tablet (500 mg total) by mouth daily. 07/06/13  Yes Rowe Clack, MD  benazepril (LOTENSIN) 5 MG tablet TAKE 1/2 TABLET BY MOUTH ONCE A DAY Patient not taking: Reported on 11/04/2014 09/18/14   Rowe Clack, MD  cyclobenzaprine (FLEXERIL) 10 MG tablet Take 1 tablet (10 mg total) by mouth 2 (two) times daily as needed for muscle spasms. Patient not taking: Reported on 10/24/2014 09/25/14   Olga Millers, MD    Social History:  reports that he quit smoking about 28 years ago. He does not have any smokeless tobacco history on file. He reports that he drinks alcohol. His drug history is not on file.  No family history on file.  Review of Systems:  Constitutional: Denies fever, chills, diaphoresis, appetite change and fatigue.  HEENT: Denies photophobia, eye pain, redness, hearing loss, ear pain, congestion, sore throat, rhinorrhea, sneezing, mouth sores, trouble swallowing, neck pain, neck stiffness and tinnitus.   Respiratory: Denies SOB, DOE, cough, chest tightness,  and wheezing.   Cardiovascular: Denies chest pain, palpitations and leg swelling.  Gastrointestinal: Denies nausea, vomiting, abdominal pain, diarrhea, constipation, blood in stool and abdominal distention.  Genitourinary: Denies dysuria, urgency, frequency, hematuria, flank pain and difficulty urinating.  Endocrine: Denies: hot or cold intolerance, sweats, changes in hair or nails, polyuria, polydipsia. Musculoskeletal: Denies myalgias, back pain, joint swelling, arthralgias and gait problem.  Skin: Denies pallor, rash and wound.  Neurological: Denies  seizures, syncope, light-headedness, and headaches.    Hematological: Denies adenopathy. Easy bruising, personal or family bleeding history  Psychiatric/Behavioral: Denies suicidal ideation, mood changes, confusion, nervousness, sleep disturbance and agitation   Physical Exam: Blood pressure 109/65, pulse 70, temperature 97.6 F (36.4 C), temperature source Oral, resp. rate 17, height 5' 6.25" (1.683 m), weight 72.122 kg (159 lb), SpO2 94 %. General: Alert, awake, oriented 3, no distress. HEENT: Normocephalic, atraumatic, pupils equal round and reactive to light, extraocular movements intact, lenses, moist mucous membranes. Neck: Supple, no JVD, no lymphadenopathy, no bruits, no goiter. Cardiovascular: Regular rate and rhythm, no murmurs, rubs or gallops. Lungs: Clear to auscultation bilaterally. Abdomen: Soft, nontender, nondistended, positive bowel sounds Extremities: No clubbing, cyanosis or edema, positive pulses, wears bilateral ankle braces. Neurologic: Mental status intact cranial nerves intact, right greater than left upper and lower extremity weakness.  Labs  on Admission:  Results for orders placed or performed during the hospital encounter of 11/04/14 (from the past 48 hour(s))  Basic metabolic panel     Status: Abnormal   Collection Time: 11/04/14  9:58 AM  Result Value Ref Range   Sodium 140 135 - 145 mmol/L    Comment: Please note change in reference range.   Potassium 4.2 3.5 - 5.1 mmol/L    Comment: Please note change in reference range.   Chloride 107 96 - 112 mEq/L   CO2 27 19 - 32 mmol/L   Glucose, Bld 145 (H) 70 - 99 mg/dL   BUN 17 6 - 23 mg/dL   Creatinine, Ser 1.05 0.50 - 1.35 mg/dL   Calcium 9.2 8.4 - 10.5 mg/dL   GFR calc non Af Amer 58 (L) >90 mL/min   GFR calc Af Amer 67 (L) >90 mL/min    Comment: (NOTE) The eGFR has been calculated using the CKD EPI equation. This calculation has not been validated in all clinical situations. eGFR's persistently <90 mL/min signify possible Chronic Kidney Disease.     Anion gap 6 5 - 15  CBC with Differential     Status: Abnormal   Collection Time: 11/04/14  9:58 AM  Result Value Ref Range   WBC 7.8 4.0 - 10.5 K/uL   RBC 4.32 4.22 - 5.81 MIL/uL   Hemoglobin 12.9 (L) 13.0 - 17.0 g/dL   HCT 40.2 39.0 - 52.0 %   MCV 93.1 78.0 - 100.0 fL   MCH 29.9 26.0 - 34.0 pg   MCHC 32.1 30.0 - 36.0 g/dL   RDW 16.3 (H) 11.5 - 15.5 %   Platelets 264 150 - 400 K/uL   Neutrophils Relative % 60 43 - 77 %   Neutro Abs 4.6 1.7 - 7.7 K/uL   Lymphocytes Relative 24 12 - 46 %   Lymphs Abs 1.9 0.7 - 4.0 K/uL   Monocytes Relative 9 3 - 12 %   Monocytes Absolute 0.7 0.1 - 1.0 K/uL   Eosinophils Relative 6 (H) 0 - 5 %   Eosinophils Absolute 0.5 0.0 - 0.7 K/uL   Basophils Relative 1 0 - 1 %   Basophils Absolute 0.1 0.0 - 0.1 K/uL  Hepatic function panel     Status: Abnormal   Collection Time: 11/04/14  9:58 AM  Result Value Ref Range   Total Protein 6.1 6.0 - 8.3 g/dL   Albumin 3.4 (L) 3.5 - 5.2 g/dL   AST 91 (H) 0 - 37 U/L   ALT 112 (H) 0 - 53 U/L   Alkaline Phosphatase 299 (H) 39 - 117 U/L   Total Bilirubin 0.6 0.3 - 1.2 mg/dL   Bilirubin, Direct 0.2 0.0 - 0.3 mg/dL   Indirect Bilirubin 0.4 0.3 - 0.9 mg/dL  Ethanol     Status: None   Collection Time: 11/04/14 10:58 AM  Result Value Ref Range   Alcohol, Ethyl (B) <5 0 - 9 mg/dL    Comment:        LOWEST DETECTABLE LIMIT FOR SERUM ALCOHOL IS 11 mg/dL FOR MEDICAL PURPOSES ONLY   Protime-INR     Status: None   Collection Time: 11/04/14 10:58 AM  Result Value Ref Range   Prothrombin Time 13.5 11.6 - 15.2 seconds   INR 1.02 0.00 - 1.49  APTT     Status: None   Collection Time: 11/04/14 10:58 AM  Result Value Ref Range   aPTT 32 24 - 37 seconds  Urine Drug Screen     Status: None   Collection Time: 11/04/14 12:15 PM  Result Value Ref Range   Opiates NONE DETECTED NONE DETECTED   Cocaine NONE DETECTED NONE DETECTED   Benzodiazepines NONE DETECTED NONE DETECTED   Amphetamines NONE DETECTED NONE DETECTED    Tetrahydrocannabinol NONE DETECTED NONE DETECTED   Barbiturates NONE DETECTED NONE DETECTED    Comment:        DRUG SCREEN FOR MEDICAL PURPOSES ONLY.  IF CONFIRMATION IS NEEDED FOR ANY PURPOSE, NOTIFY LAB WITHIN 5 DAYS.        LOWEST DETECTABLE LIMITS FOR URINE DRUG SCREEN Drug Class       Cutoff (ng/mL) Amphetamine      1000 Barbiturate      200 Benzodiazepine   622 Tricyclics       297 Opiates          300 Cocaine          300 THC              50   Urinalysis, Routine w reflex microscopic     Status: None   Collection Time: 11/04/14 12:15 PM  Result Value Ref Range   Color, Urine YELLOW YELLOW   APPearance CLEAR CLEAR   Specific Gravity, Urine 1.015 1.005 - 1.030   pH 5.5 5.0 - 8.0   Glucose, UA NEGATIVE NEGATIVE mg/dL   Hgb urine dipstick NEGATIVE NEGATIVE   Bilirubin Urine NEGATIVE NEGATIVE   Ketones, ur NEGATIVE NEGATIVE mg/dL   Protein, ur NEGATIVE NEGATIVE mg/dL   Urobilinogen, UA 0.2 0.0 - 1.0 mg/dL   Nitrite NEGATIVE NEGATIVE   Leukocytes, UA NEGATIVE NEGATIVE    Comment: MICROSCOPIC NOT DONE ON URINES WITH NEGATIVE PROTEIN, BLOOD, LEUKOCYTES, NITRITE, OR GLUCOSE <1000 mg/dL.  I-Stat Troponin, ED (not at [facility])     Status: Abnormal   Collection Time: 11/04/14 12:22 PM  Result Value Ref Range   Troponin i, poc 0.16 (HH) 0.00 - 0.08 ng/mL   Comment NOTIFIED PHYSICIAN    Comment 3            Comment: Due to the release kinetics of cTnI, a negative result within the first hours of the onset of symptoms does not rule out myocardial infarction with certainty. If myocardial infarction is still suspected, repeat the test at appropriate intervals.   I-Stat Chem 8, ED     Status: Abnormal   Collection Time: 11/04/14 12:23 PM  Result Value Ref Range   Sodium 138 135 - 145 mmol/L   Potassium 4.5 3.5 - 5.1 mmol/L   Chloride 104 96 - 112 mEq/L   BUN 18 6 - 23 mg/dL   Creatinine, Ser 1.00 0.50 - 1.35 mg/dL   Glucose, Bld 87 70 - 99 mg/dL   Calcium, Ion 1.15 1.13 -  1.30 mmol/L   TCO2 25 0 - 100 mmol/L   Hemoglobin 12.2 (L) 13.0 - 17.0 g/dL   HCT 36.0 (L) 39.0 - 52.0 %  Troponin I     Status: Abnormal   Collection Time: 11/04/14  1:25 PM  Result Value Ref Range   Troponin I 0.22 (H) <0.031 ng/mL    Comment:        PERSISTENTLY INCREASED TROPONIN VALUES IN THE RANGE OF 0.04-0.49 ng/mL CAN BE SEEN IN:       -UNSTABLE ANGINA       -CONGESTIVE HEART FAILURE       -MYOCARDITIS       -CHEST TRAUMA       -  ARRYHTHMIAS       -LATE PRESENTING MYOCARDIAL INFARCTION       -COPD   CLINICAL FOLLOW-UP RECOMMENDED. Please note change in reference range.     Radiological Exams on Admission: Ct Head Wo Contrast  11/04/2014   CLINICAL DATA:  Right arm pain and dizziness.  EXAM: CT HEAD WITHOUT CONTRAST  TECHNIQUE: Contiguous axial images were obtained from the base of the skull through the vertex without contrast.  COMPARISON:  03/16/2014  FINDINGS: Stable encephalomalacia in the left temporal lobe. Mild atrophy is unchanged. Stable low-density throughout the subcortical and periventricular white matter. No evidence for acute hemorrhage, mass lesion, midline shift, hydrocephalus or large new infarct. Stable changes compatible with previous left mastoid surgery. The visualized paranasal sinuses are clear. No acute bone abnormality.  IMPRESSION: No acute intracranial abnormality.  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  Stable white matter changes are most likely related to chronic small vessel ischemic disease.   Electronically Signed   By: Markus Daft M.D.   On: 11/04/2014 12:29    Assessment/Plan Principal Problem:   CVA (cerebral infarction) Active Problems:   Essential hypertension   ATRIAL FIBRILLATION, PAROXYSMAL   Chronic combined systolic and diastolic congestive heart failure   PACEMAKER, PERMANENT   Right arm weakness    Probable Acute CVA  -This is the most likely diagnosis for his symptoms. -Initial CT scan of the head without  findings; unfortunately unable to perform an MRI given his pacemaker. -Upgrade aspirin to Plavix for secondary stroke prevention. -Check 2-D echo, carotid Dopplers, lipid profile for further risk stratification. -Admit to telemetry.  - request neurology consultation.  Chronic combined CHF -Compensated.  Hypertension  -He is currently hypotensive, will hold blood pressure medications especially given possibility of acute CVA.  DVT prophylaxis -Subcutaneous heparin.  CODE STATUS -Full code    Time Spent on Admission: 80 minutes  HERNANDEZ ACOSTA,ESTELA [facility] Pager: [phone] 11/04/2014, 3:40 PM

## 2014-11-04 NOTE — ED Notes (Signed)
MD at bedside. 

## 2014-11-04 NOTE — ED Provider Notes (Addendum)
CSN: [id number]     Arrival date & time 11/04/14  0350 History  This chart was scribed for Richarda Blade, MD by Stephania Fragmin, ED Scribe. This patient was seen in room APA18/APA18 and the patient's care was started at 10:23 AM.    Chief Complaint  Patient presents with  . Weakness   The history is provided by the patient and a relative.     HPI Comments: [name] is a 80 y.o. male who presents to the Emergency Department complaining of right arm pain and dizziness. Per daughter, patient was last normal at breakfast about 2 hours ago, but patient shortly after complained of dizziness when ambulating and right arm pain. Movement exacerbates his arm pain somewhat, although patient also states his arm pain has since resolved. He also states that the dizziness has improved. Daughter states that patient has a pacemaker placed a year ago by Dr. Lovena Le at [facility]. Per daughter, patient fractured his sacrum after falling onto his buttocks about 9 weeks ago, and has had to rehabilitate his leg strength since. He was given pain medication that caused hallucinations. Patient is able to ambulate with a walker. He wears bilateral ankle braces, since patient lacks cartilage in his ankles.  No other recent illnesses.  Symptom onset this morning, 8:30 AM.There are no other known modifying factors. Per daughter, patient's cardiologist is Dr. Johnsie Cancel.     Past Medical History  Diagnosis Date  . Paroxysmal atrial fibrillation   . CAD (coronary artery disease)     CABG 08/1973  . CHF (congestive heart failure)     ICM  . Pacemaker 09/2003  . Hypertension   . Hyperlipidemia   . Unsteady gait     due to B ankle DJD and instability  . Memory loss   . Chronic renal insufficiency   . Barrett's esophagus   . Diverticulosis of colon   . GERD (gastroesophageal reflux disease)   . Adenocarcinoma of prostate 10/2005    lupron injections q83mo  . Atrial fibrillation     chronic anticoag  .  Arthritis     ?rheumatoid, never dx or on DMARDs  . Osteoporosis    Past Surgical History  Procedure Laterality Date  . Pacemaker placement  09/2003  . Hernia repair      age 57 ( not sure if it was right or left side)  . Coronary artery bypass graft  08/1973    3 heart heart arteries were 80 percent clogged. the lower valve of my heart is not functioning  . Doppler echocardiography  2004, 2008  . Pacemaker generator change N/A 05/02/2013    Procedure: PACEMAKER GENERATOR CHANGE;  Surgeon: Evans Lance, MD;  Location: [facility] CATH LAB;  Service: Cardiovascular;  Laterality: N/A;   No family history on file. History  Substance Use Topics  . Smoking status: Former Smoker    Quit date: 08/05/1986  . Smokeless tobacco: Not on file  . Alcohol Use: Yes     Comment: samll glass of wine    Review of Systems  Musculoskeletal: Positive for myalgias.  Neurological: Positive for dizziness.  All other systems reviewed and are negative.     Allergies  Review of patient's allergies indicates no known allergies.  Home Medications   Prior to Admission medications   Medication Sig Start Date End Date Taking? Authorizing Provider  acetaminophen (TYLENOL) 325 MG tablet Take 650 mg by mouth every 6 (six) hours as needed for mild pain.  Yes Historical Provider, MD  aspirin EC 325 MG tablet Take 81 mg by mouth at bedtime.  07/06/13  Yes Rowe Clack, MD  benazepril (LOTENSIN) 5 MG tablet Take 2.5 mg by mouth daily.   Yes Historical Provider, MD  furosemide (LASIX) 40 MG tablet Take 20 mg by mouth daily as needed for fluid or edema.   Yes Historical Provider, MD  Magnesium 500 MG CAPS Take 1 capsule by mouth daily. Take one by mouth daily   Yes Historical Provider, MD  mirtazapine (REMERON) 7.5 MG tablet Take 1 tablet (7.5 mg total) by mouth at bedtime. 10/24/14  Yes Olga Millers, MD  Misc Natural Products (LUTEIN 20 PO) Take 1 tablet by mouth daily.    Yes Historical Provider, MD   nitroGLYCERIN (NITROSTAT) 0.4 MG SL tablet Place 1 tablet (0.4 mg total) under the tongue every 5 (five) minutes as needed. 08/03/14  Yes Josue Hector, MD  potassium chloride SA (K-DUR,KLOR-CON) 20 MEQ tablet Take 20 mEq by mouth daily as needed (take with lasix).   Yes Historical Provider, MD  pravastatin (PRAVACHOL) 10 MG tablet TAKE 1 TABLET (10 MG TOTAL) BY MOUTH EVERY EVENING. 09/18/14  Yes Rowe Clack, MD  ranitidine (ZANTAC) 150 MG tablet Take 150 mg by mouth at bedtime.   Yes Historical Provider, MD  vitamin C (ASCORBIC ACID) 500 MG tablet Take 1 tablet (500 mg total) by mouth daily. 07/06/13  Yes Rowe Clack, MD  benazepril (LOTENSIN) 5 MG tablet TAKE 1/2 TABLET BY MOUTH ONCE A DAY Patient not taking: Reported on 11/04/2014 09/18/14   Rowe Clack, MD  cyclobenzaprine (FLEXERIL) 10 MG tablet Take 1 tablet (10 mg total) by mouth 2 (two) times daily as needed for muscle spasms. Patient not taking: Reported on 10/24/2014 09/25/14   Olga Millers, MD   BP 109/64 mmHg  Pulse 66  Temp(Src) 97.6 F (36.4 C) (Oral)  Resp 21  Ht 5' 6.25" (1.683 m)  Wt 159 lb (72.122 kg)  BMI 25.46 kg/m2  SpO2 98% Physical Exam  Constitutional: He is oriented to person, place, and time. He appears well-developed. No distress (he is able to sit on the stretcher, by his own power.).  Frail, elderly.  HENT:  Head: Normocephalic and atraumatic.  Right Ear: External ear normal.  Left Ear: External ear normal.  Eyes: Conjunctivae and EOM are normal. Pupils are equal, round, and reactive to light.  Neck: Normal range of motion and phonation normal. Neck supple.  Cardiovascular: Normal rate, regular rhythm and normal heart sounds.   Pulmonary/Chest: Effort normal and breath sounds normal. He exhibits no bony tenderness.  Abdominal: Soft. There is no tenderness.  Musculoskeletal: Normal range of motion. He exhibits edema (1+, bilateral).  Neurological: He is alert and oriented to  person, place, and time. No cranial nerve deficit or sensory deficit. Coordination normal.  No dysarthria and aphasia or nystagmus.  Normal upper extremity strength.  Able to left.  Both legs off stretcher about 12 inches and hold them.  Normal finger-to-nose, bilaterally.  Heel to shin testing deferred because of chronic bracing on ankles.  Skin: Skin is warm, dry and intact.  Psychiatric: He has a normal mood and affect. His behavior is normal. Judgment and thought content normal.  Nursing note and vitals reviewed.   ED Course  Procedures (including critical care time)  10:20- initial evaluation, and consideration for thrombolysis.  Patient's symptoms have improved, and almost normalized, since onset.  Thrombolysis,  therefore will not be given.   DIAGNOSTIC STUDIES: Oxygen Saturation is 98% on room air, normal by my interpretation.    COORDINATION OF CARE: 10:36 AM - Discussed treatment plan with pt at bedside which includes a possible MRI and pt agreed to plan.   Medications - No data to display   Medications - No data to display  Patient Vitals for the past 24 hrs:  BP Temp Temp src Pulse Resp SpO2 Height Weight  11/04/14 1230 109/64 mmHg - - 66 21 98 % - -  11/04/14 1202 - 97.6 F (36.4 C) - - - - - -  11/04/14 1200 (!) 86/58 mmHg - - 66 14 97 % - -  11/04/14 1130 (!) 88/59 mmHg - - 69 18 95 % - -  11/04/14 1100 90/61 mmHg - - - 20 - - -  11/04/14 1030 94/63 mmHg - - 74 18 97 % - -  11/04/14 1000 108/69 mmHg - - 71 18 98 % - -  11/04/14 0958 121/84 mmHg 97.6 F (36.4 C) Oral 74 16 98 % 5' 6.25" (1.683 m) 159 lb (72.122 kg)    1:43 PM Reevaluation with update and discussion. After initial assessment and treatment, an updated evaluation reveals no change in clinical status.  No recurrence of dizziness, or progressive symptoms in right arm.  Patient continues to be comfortable, and denies chest pain.  Findings discussed with patient and family members, all questions answered.  Kamile Fassler L    1:48 PM-Consult complete with Dr. Jerilee Hoh. Patient case explained and discussed. She  agrees to admit patient for further evaluation and treatment. Call ended at [facility] - Abnormal; Notable for the following:    Glucose, Bld 145 (*)    GFR calc non Af Amer 58 (*)    GFR calc Af Amer 67 (*)    All other components within normal limits  CBC WITH DIFFERENTIAL - Abnormal; Notable for the following:    Hemoglobin 12.9 (*)    RDW 16.3 (*)    Eosinophils Relative 6 (*)    All other components within normal limits  HEPATIC FUNCTION PANEL - Abnormal; Notable for the following:    Albumin 3.4 (*)    AST 91 (*)    ALT 112 (*)    Alkaline Phosphatase 299 (*)    All other components within normal limits  I-STAT CHEM 8, ED - Abnormal; Notable for the following:    Hemoglobin 12.2 (*)    HCT 36.0 (*)    All other components within normal limits  I-STAT TROPOININ, ED - Abnormal; Notable for the following:    Troponin i, poc 0.16 (*)    All other components within normal limits  ETHANOL  URINE RAPID DRUG SCREEN (HOSP PERFORMED)  URINALYSIS, ROUTINE W REFLEX MICROSCOPIC  PROTIME-INR  APTT  TROPONIN I  I-STAT TROPOININ, ED    Imaging Review Ct Head Wo Contrast  11/04/2014   CLINICAL DATA:  Right arm pain and dizziness.  EXAM: CT HEAD WITHOUT CONTRAST  TECHNIQUE: Contiguous axial images were obtained from the base of the skull through the vertex without contrast.  COMPARISON:  03/16/2014  FINDINGS: Stable encephalomalacia in the left temporal lobe. Mild atrophy is unchanged. Stable low-density throughout the subcortical and periventricular white matter. No evidence for acute hemorrhage, mass lesion, midline shift, hydrocephalus or large new infarct. Stable changes compatible with previous left mastoid surgery. The visualized paranasal sinuses are clear. No  acute bone abnormality.  IMPRESSION: No acute intracranial abnormality.   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  Stable white matter changes are most likely related to chronic small vessel ischemic disease.   Electronically Signed   By: Markus Daft M.D.   On: 11/04/2014 12:29     EKG Interpretation   Date/Time:  Saturday November 04 2014 12:09:05 EST Ventricular Rate:  67 PR Interval:  206 QRS Duration: 117 QT Interval:  431 QTC Calculation: 455 R Axis:   -3 Text Interpretation:  Sinus tachycardia Ventricular tachycardia,  unsustained Nonspecific intraventricular conduction delay Inferior  infarct, age indeterminate Lateral leads are also involved Since last  tracing of earlier today No significant change was found Confirmed by  Eulis Foster  MD, Deidrea Gaetz [phone]) on 11/04/2014 1:32:39 PM      MDM   Final diagnoses:  Dizziness  Neck swelling  Transient cerebral ischemia, unspecified transient cerebral ischemia type   Note: DX of 'Neck Swelling" is wrong. It was entered in error when a CT was ordered on this patient, instead of the correct patient. After canceling the CT, the erroneous DX cannot be deleted.   Dizziness with nonspecific right arm discomfort.  Most likely unifying diagnosis, is TIA.  Initial screening with CT head negative for stroke.  I cannot determine if the patient has a MRI compatible pacemaker.  Nonspecific troponin elevation in patient without chest pain.  I doubt ACS, PE or aortic dissection.  He will require further evaluation, and testing.  This needs to be done in a hospital setting.  Nursing Notes Reviewed/ Care Coordinated, and agree without changes. Applicable Imaging Reviewed.  Interpretation of Laboratory Data incorporated into ED treatment  Plan: Admit  I personally performed the services described in this documentation, which was scribed in my presence. The recorded information has been reviewed and is accurate.       Richarda Blade, MD 11/04/14 [place], MD 11/05/14 [phone]

## 2014-11-04 NOTE — ED Notes (Signed)
Patient arrives POV with c/o right arm pain and dizziness that started today at 0900. Patient states the pain in the right arm started just prior to dizziness. Patient arrives alert/oriented x 4. Speech clear. Grips equal bilaterally. Patient is more unsteady on feet than normal. Denies headache, denies chest pain or shortness of breath.

## 2014-11-05 ENCOUNTER — Inpatient Hospital Stay (HOSPITAL_COMMUNITY): Payer: Medicare Other

## 2014-11-05 DIAGNOSIS — I369 Nonrheumatic tricuspid valve disorder, unspecified: Secondary | ICD-10-CM

## 2014-11-05 DIAGNOSIS — M79601 Pain in right arm: Secondary | ICD-10-CM

## 2014-11-05 DIAGNOSIS — G459 Transient cerebral ischemic attack, unspecified: Principal | ICD-10-CM

## 2014-11-05 DIAGNOSIS — R740 Nonspecific elevation of levels of transaminase and lactic acid dehydrogenase [LDH]: Secondary | ICD-10-CM

## 2014-11-05 DIAGNOSIS — R748 Abnormal levels of other serum enzymes: Secondary | ICD-10-CM

## 2014-11-05 DIAGNOSIS — R7989 Other specified abnormal findings of blood chemistry: Secondary | ICD-10-CM

## 2014-11-05 DIAGNOSIS — R778 Other specified abnormalities of plasma proteins: Secondary | ICD-10-CM

## 2014-11-05 DIAGNOSIS — R7401 Elevation of levels of liver transaminase levels: Secondary | ICD-10-CM

## 2014-11-05 LAB — HEMOGLOBIN A1C
HEMOGLOBIN A1C: 6 % — AB (ref <5.7)
Mean Plasma Glucose: 126 mg/dL — ABNORMAL HIGH (ref <117)

## 2014-11-05 LAB — TROPONIN I: TROPONIN I: 0.14 ng/mL — AB (ref <0.031)

## 2014-11-05 LAB — LIPID PANEL
CHOL/HDL RATIO: 4.5 ratio
Cholesterol: 175 mg/dL (ref 0–200)
HDL: 39 mg/dL — ABNORMAL LOW (ref >39)
LDL Cholesterol: 117 mg/dL — ABNORMAL HIGH (ref 0–99)
Triglycerides: 97 mg/dL (ref <150)
VLDL: 19 mg/dL (ref 0–40)

## 2014-11-05 LAB — HEPATIC FUNCTION PANEL
ALBUMIN: 3.1 g/dL — AB (ref 3.5–5.2)
ALT: 82 U/L — ABNORMAL HIGH (ref 0–53)
AST: 62 U/L — AB (ref 0–37)
Alkaline Phosphatase: 270 U/L — ABNORMAL HIGH (ref 39–117)
BILIRUBIN TOTAL: 0.7 mg/dL (ref 0.3–1.2)
Bilirubin, Direct: 0.2 mg/dL (ref 0.0–0.3)
Indirect Bilirubin: 0.5 mg/dL (ref 0.3–0.9)
TOTAL PROTEIN: 5.2 g/dL — AB (ref 6.0–8.3)

## 2014-11-05 MED ORDER — ASPIRIN 81 MG PO CHEW
81.0000 mg | CHEWABLE_TABLET | Freq: Every day | ORAL | Status: DC
  Administered 2014-11-05 – 2014-11-06 (×2): 81 mg via ORAL
  Filled 2014-11-05 (×2): qty 1

## 2014-11-05 NOTE — Progress Notes (Signed)
PROGRESS NOTE  [name] DZH:[id number] DOB: 09/06/1917 DOA: 11/04/2014 PCP: Gwendolyn Grant, MD Cardiologist Dr. Johnsie Cancel  Summary: 80 year old man with multiple medical problems including atrial fibrillation not on anticoagulation who presented with history of gait unsteadiness, dizziness and right arm pain with numbness and tingling. Initial evaluation included CT of the head which was unremarkable and mildly elevated troponin of unclear significance. Admitted for possible TIA versus CVA, Plavix added. Right arm pain, dizziness, concern for TIA. Nonspecific troponin elevation.  Assessment/Plan: 1. Lightheadedness with right arm pain present on admission. Resolved. Orthostatics were negative today. Orthostatics not checked on admission. Significance unclear. 2. Possible TIA/CVA. No focal deficits noted. Aspirin was changed to Plavix. CT head negative. Carotid ultrasound unremarkable, neurology consultation pending for 12/28. No MRI secondary to pacemaker. 3. Positive troponin with history of right arm pain. No chest pain. Asymptomatic at this point. Minimal troponin elevation of unclear significance. Normal systolic function. Wall motion abnormality noted. Chronicity unclear. Consider demand ischemia, ACS doubted. Given history of frequent falls as well as subdural hematoma earlier this year, no anticoagulation. Continue Plavix, add aspirin. 4. Elevated alkaline phosphatase, AST, ALT of unclear significance, improved today. No abdominal symptoms. 5. Atrial fibrillation not on anticoagulation secondary to gait instability, frequent falls, advanced age. 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 6. History of coronary artery disease, status post pacemaker 7. History of pelvic fracture recently 8. Bilateral ankle braces secondary to lack of cartilage in the ankles   Overall improved. He does apparently have some memory loss and his history is not entirely reliable but he is  currently pain-free and has no complaints. Exam is nonfocal. Consider TIA but there is no evidence to suggest CVA at this point. Orthostatics were not checked on admission but orthostasis would be in the differential.   Neuro consult pending  Right arm pain resolved. EKG abnormal but chronic in nature. He is pain-free at this point. Given his history of traumatic bleeding and gait instability, hold off on anticoagulation. Add aspirin. Start low-dose beta blocker if BP can tolerate (cannot currently). Will ask cardiology opinion on echocardiogram findings 12/28.  Recheck CMP in the morning.  Avoid pain medications which have caused hallucinations in the past   Code Status: full code DVT prophylaxis: heparin SQ Family Communication: none Disposition Plan: home  Murray Hodgkins, MD  [facility]  Pager [phone] If 7PM-7AM, please contact night-coverage at [email or web address], password [facility] 11/05/2014, 10:33 AM  LOS: 1 day   Consultants:  Physical therapy: No follow-up needed. Noted to have strength and coordination within normal limits.  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  Antibiotics:    HPI/Subjective: Seen by physical therapy, no follow-up recommended.  No chest pain, no arm pain, no numbness, no dizziness today. No SOB.  Had lightheadedness yesterday.  Objective: Filed Vitals:   11/05/14 0000 11/05/14 0211 11/05/14 0428 11/05/14 1000  BP: 108/59 107/58 99/53 97/52   Pulse: 61 63 59 60  Temp: 97.6 F (36.4 C) 97.5 F (36.4 C) 98 F (36.7 C) 97.8 F (36.6 C)  TempSrc: Oral Oral Oral Oral  Resp: 18 18 16 18   Height:      Weight:   72.573 kg (159 lb 15.9 oz)   SpO2: 98% 97% 98% 96%  Intake/Output Summary (Last 24 hours) at 11/05/14 1033 Last data filed at 11/05/14 0900  Gross per 24 hour  Intake  238.5 ml  Output    300 ml  Net  -61.5 ml     Filed Weights   11/04/14 0958 11/04/14 1547 11/05/14 0428  Weight: 72.122 kg (159 lb) 72.802 kg (160 lb 8 oz) 72.573 kg (159 lb 15.9 oz)    Exam:     Afebrile, vital signs are stable. No hypoxia.  General: Appears calm, comfortable.  Psych: Alert. Speech fluent and clear.  Eyes: Pupils irises and lids appear unremarkable.  CV: RRR no r/g. 2/6 holosystolic murmur RUSB  Respiratory: Clear to auscultation bilaterally. No wheezes, rales or rhonchi. Normal respiratory effort.  Musculoskeletal: as below  Neuro: CN II-XII intact, normal tone/strength all extremities  Data Reviewed:  Alkaline phosphatase, AST and ALT all have improved. Total bilirubin is normal.  Pertinent data:  LDL 117  Troponin trend 0.16 >> 0.22 >> 0.14, no repeat today  Alkaline phosphatase, AST, ALT elevated on admission  CT head no acute abnormality. 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 Chronic small vessel ischemic disease suspected  EKG abnormal, sinus rhythm with inferolateral T-wave inversion, nonspecific intraventricular conduction delay; compared to previous EKG 09/16/2014 T-wave inversion and conduction delay are old. Most recent EKG 1552 12/26 normal sinus rhythm, resolution of T-wave inversion question spurious  Pending data:    Scheduled Meds: .  stroke: mapping our early stages of recovery book   Does not apply Once  . clopidogrel  75 mg Oral Daily  . famotidine  20 mg Oral QHS  . feeding supplement (ENSURE COMPLETE)  237 mL Oral BID BM  . heparin  5,000 Units Subcutaneous 3 times per day  . magnesium oxide  400 mg Oral Daily  . mirtazapine  7.5 mg Oral QHS  . pravastatin  10 mg Oral q1800   Continuous Infusions:   Principal Problem:   TIA (transient ischemic attack) Active Problems:   Essential hypertension    ATRIAL FIBRILLATION, PAROXYSMAL   PACEMAKER, PERMANENT   Right arm pain   Elevated troponin   Elevated alkaline phosphatase level   Transaminitis   Time spent 25 minutes

## 2014-11-05 NOTE — Evaluation (Signed)
Physical Therapy Evaluation Patient Details Name: [name] MRN: [id number] DOB: [birth year] Today's Date: 11/05/2014   History of Present Illness  Patient is a 80 year old gentleman with multiple medical problems including history of A. fib and not on chronic anticoagulation, coronary artery disease, chronic combined CHF, status post permanent pacer, hypertension, hyperlipidemia who recently suffered a fall and had a sacral fracture. Patient's wife and daughter are present and daughters gives most of the history. Daughter states that patient has been recovering well from his fall, he woke up this morning and proceeded to do his exercises in bed which include lifting his legs repeatedly which he had no trouble doing. He later went to the bathroom to complete his ablutions and when he came downstairs was a little unsteady on his feet and told the daughter that he was a bit dizzy. They proceeded to get into the car and come to [place] for a Christmas lunch with patient's son. On the way here patient started noticed that he was clenching and unclenching his right fist. When questioned patient said that his right arm had been hurting him and was numb and tingling. He was brought to the hospital for evaluation. Onset of symptoms was around 9:00 this morning. His symptoms have improved although still not resolved. Initial CT scan of the head does not show any acute findings, unfortunately we are unable to perform an MRI given his pacer. We have been asked to admit him for further evaluation and management.  Clinical Impression   Pt was seen for evaluation this morning.  He is a delightful gentleman, [name] who reports that he has no significant symptoms from yesterday's events except for mild numbness in the right hand.  He has no visual or speech deficits and is eating normally.  His strength and coordination in the right extremeties are WNL and he was able to sit at EOB and don his ankle braces  independently.  His balance was not formally tested but his gait was stable with a walker.  He states that he does not walk much at home because walking causes great pain in his ankles.  He has a full array of DME at his disposal at home and appears to be doing as well as possible.  He is at functional baseline and should not need any further PT.    Follow Up Recommendations No PT follow up    Equipment Recommendations  None recommended by PT    Recommendations for Other Services   none    Precautions / Restrictions Precautions Precautions: Fall Required Braces or Orthoses: Other Brace/Splint Other Brace/Splint: bilateral ankle braces which are to be worn for all weight bearing secondary to severe OA of both ankles Restrictions Weight Bearing Restrictions: No      Mobility  Bed Mobility Overal bed mobility: Independent                Transfers Overall transfer level: Independent                  Ambulation/Gait Ambulation/Gait assistance: Modified independent (Device/Increase time) Ambulation Distance (Feet): 80 Feet Assistive device: Rolling walker (2 wheeled) Gait Pattern/deviations: Trunk flexed;Decreased dorsiflexion - left;Decreased dorsiflexion - right Gait velocity: appropriate for him   General Gait Details: pt has stable gait pattern, limited ankle motion during gait cycle  Stairs            Wheelchair Mobility    Modified Rankin (Stroke Patients Only)  Balance Overall balance assessment: No apparent balance deficits (not formally assessed)                                           Pertinent Vitals/Pain Pain Assessment: No/denies pain    Home Living Family/patient expects to be discharged to:: Private residence Living Arrangements: Spouse/significant other;Children Available Help at Discharge: Family;Available 24 hours/day Type of Home: House Home Access: Ramped entrance     Home Layout: Two level Home  Equipment: Walker - 2 wheels;Walker - 4 wheels;Cane - quad;Bedside commode;Electric scooter;Shower seat      Prior Function Level of Independence: Independent with assistive device(s)               Hand Dominance   Dominant Hand:  (ambidextrous)    Extremity/Trunk Assessment   Upper Extremity Assessment: Overall WFL for tasks assessed           Lower Extremity Assessment: Overall WFL for tasks assessed      Cervical / Trunk Assessment: Kyphotic  Communication   Communication: HOH (has bilateral hearing aids)  Cognition Arousal/Alertness: Awake/alert Behavior During Therapy: WFL for tasks assessed/performed Overall Cognitive Status: Within Functional Limits for tasks assessed                      General Comments      Exercises        Assessment/Plan    PT Assessment Patent does not need any further PT services  PT Diagnosis     PT Problem List    PT Treatment Interventions     PT Goals (Current goals can be found in the Care Plan section) Acute Rehab PT Goals PT Goal Formulation: All assessment and education complete, DC therapy    Frequency     Barriers to discharge  none      Co-evaluation               End of Session Equipment Utilized During Treatment: Gait belt (bilateral ankle braces) Activity Tolerance: Patient tolerated treatment well Patient left: in bed;with call bell/phone within reach;with bed alarm set Nurse Communication: Mobility status    Functional Assessment Tool Used: clinical judgement Functional Limitation: Mobility: Walking and moving around Mobility: Walking and Moving Around Current Status (U2725): At least 1 percent but less than 20 percent impaired, limited or restricted Mobility: Walking and Moving Around Goal Status [phone]): At least 1 percent but less than 20 percent impaired, limited or restricted Mobility: Walking and Moving Around Discharge Status [phone]): At least 1 percent but less than 20  percent impaired, limited or restricted    Time: 0930-1004 PT Time Calculation (min) (ACUTE ONLY): 34 min   Charges:   PT Evaluation $Initial PT Evaluation Tier I: 1 Procedure     PT G Codes:   PT G-Codes **NOT FOR INPATIENT CLASS** Functional Assessment Tool Used: clinical judgement Functional Limitation: Mobility: Walking and moving around Mobility: Walking and Moving Around Current Status (Q5956): At least 1 percent but less than 20 percent impaired, limited or restricted Mobility: Walking and Moving Around Goal Status [phone]): At least 1 percent but less than 20 percent impaired, limited or restricted Mobility: Walking and Moving Around Discharge Status [phone]): At least 1 percent but less than 20 percent impaired, limited or restricted    Sable Feil 11/05/2014, 10:31 AM

## 2014-11-05 NOTE — Progress Notes (Signed)
  Echocardiogram 2D Echocardiogram has been performed.  [name] 11/05/2014, 9:17 AM

## 2014-11-05 NOTE — Progress Notes (Signed)
Pt family concerned with delay in care due to not knowing if pacemaker is MRI compatible.  Attempted to call [employer] to verify if compatible, unable to get info due to weekend hrs.  Tech is available starting Monday, 7 am- 6 pm (central time).  Please call and check for MRI compatibility.  Then please place information in notes within pt history, next to pacemaker.     [employer] [phone] Monday 7 am - 6 pm (central)  Pt pacemaker info:  [employer], [id number] [place], Serial # [id number] H.

## 2014-11-06 ENCOUNTER — Encounter (HOSPITAL_COMMUNITY): Payer: Self-pay | Admitting: Adult Health

## 2014-11-06 ENCOUNTER — Inpatient Hospital Stay (HOSPITAL_COMMUNITY): Payer: Medicare Other

## 2014-11-06 DIAGNOSIS — M79601 Pain in right arm: Secondary | ICD-10-CM

## 2014-11-06 DIAGNOSIS — R7989 Other specified abnormal findings of blood chemistry: Secondary | ICD-10-CM

## 2014-11-06 DIAGNOSIS — R748 Abnormal levels of other serum enzymes: Secondary | ICD-10-CM

## 2014-11-06 DIAGNOSIS — R42 Dizziness and giddiness: Secondary | ICD-10-CM

## 2014-11-06 DIAGNOSIS — I480 Paroxysmal atrial fibrillation: Secondary | ICD-10-CM

## 2014-11-06 LAB — COMPREHENSIVE METABOLIC PANEL
ALT: 73 U/L — ABNORMAL HIGH (ref 0–53)
ANION GAP: 6 (ref 5–15)
AST: 53 U/L — ABNORMAL HIGH (ref 0–37)
Albumin: 3 g/dL — ABNORMAL LOW (ref 3.5–5.2)
Alkaline Phosphatase: 265 U/L — ABNORMAL HIGH (ref 39–117)
BILIRUBIN TOTAL: 0.7 mg/dL (ref 0.3–1.2)
BUN: 18 mg/dL (ref 6–23)
CHLORIDE: 107 meq/L (ref 96–112)
CO2: 27 mmol/L (ref 19–32)
Calcium: 8.7 mg/dL (ref 8.4–10.5)
Creatinine, Ser: 0.92 mg/dL (ref 0.50–1.35)
GFR, EST AFRICAN AMERICAN: 81 mL/min — AB (ref >90)
GFR, EST NON AFRICAN AMERICAN: 69 mL/min — AB (ref >90)
GLUCOSE: 85 mg/dL (ref 70–99)
Potassium: 3.9 mmol/L (ref 3.5–5.1)
SODIUM: 140 mmol/L (ref 135–145)
Total Protein: 5.2 g/dL — ABNORMAL LOW (ref 6.0–8.3)

## 2014-11-06 MED ORDER — CLOPIDOGREL BISULFATE 75 MG PO TABS: 75.0000 mg | ORAL_TABLET | Freq: Every day | ORAL | Status: DC

## 2014-11-06 NOTE — Care Management Utilization Note (Signed)
UR complete 

## 2014-11-06 NOTE — Evaluation (Signed)
Occupational Therapy Evaluation Patient Details Name: FONTAINE HEHL MRN: [id number] DOB: [birth year] Today's Date: 11/06/2014    History of Present Illness Patient is a 80 year old gentleman with multiple medical problems including history of A. fib and not on chronic anticoagulation, coronary artery disease, chronic combined CHF, status post permanent pacer, hypertension, hyperlipidemia who recently suffered a fall and had a sacral fracture. Patient's wife and daughter are present and daughters gives most of the history. Daughter states that patient has been recovering well from his fall, he woke up this morning and proceeded to do his exercises in bed which include lifting his legs repeatedly which he had no trouble doing. He later went to the bathroom to complete his ablutions and when he came downstairs was a little unsteady on his feet and told the daughter that he was a bit dizzy. They proceeded to get into the car and come to [place] for a Christmas lunch with patient's son. On the way here patient started noticed that he was clenching and unclenching his right fist. When questioned patient said that his right arm had been hurting him and was numb and tingling. He was brought to the hospital for evaluation. Onset of symptoms was around 9:00 this morning. His symptoms have improved although still not resolved. Initial CT scan of the head does not show any acute findings, unfortunately we are unable to perform an MRI given his pacer. We have been asked to admit him for further evaluation and management.   Clinical Impression   Pt is presenting to acute OT with above situation.  He has WFL strength and ROM in BUE.  Pt was receiving some assist for IADL needs, and set-up assist for ADL needs at home, and reports no concerns about returning to this level of independence upon d/c.  Pt does not need any further OT services at this time, as he is presenting at baseline functioning.  No OT follow up at  this time.      Follow Up Recommendations  No OT follow up    Equipment Recommendations  None recommended by OT    Recommendations for Other Services       Precautions / Restrictions Precautions Precautions: Fall Required Braces or Orthoses: Other Brace/Splint Other Brace/Splint: bilateral ankle braces which are to be worn for all weight bearing secondary to severe OA of both ankles Restrictions Weight Bearing Restrictions: No      Mobility Bed Mobility                  Transfers                      Balance                                            ADL Overall ADL's : At baseline                                             Vision                     Perception     Praxis      Pertinent Vitals/Pain Pain Assessment: No/denies pain     Hand Dominance Left (writes with left  hand, eats with right)   Extremity/Trunk Assessment Upper Extremity Assessment Upper Extremity Assessment: Overall WFL for tasks assessed   Lower Extremity Assessment Lower Extremity Assessment: Defer to PT evaluation       Communication Communication Communication: HOH   Cognition Arousal/Alertness: Awake/alert Behavior During Therapy: WFL for tasks assessed/performed Overall Cognitive Status: Within Functional Limits for tasks assessed                     General Comments       Exercises       Shoulder Instructions      Home Living Family/patient expects to be discharged to:: Private residence Living Arrangements: Spouse/significant other;Children Available Help at Discharge: Family;Available 24 hours/day Type of Home: House Home Access: Ramped entrance     Home Layout: Two level Alternate Level Stairs-Number of Steps: pt has a stair elevator chair to go between floors             Home Equipment: Walker - 2 wheels;Walker - 4 wheels;Cane - quad;Bedside commode;Electric scooter;Shower seat           Prior Functioning/Environment Level of Independence: Independent with assistive device(s);Needs assistance    ADL's / Homemaking Assistance Needed: Daughter completes all needed IADL tasks - meal prep, laundry, grocery shopping, household chores; provides set-up assist for dressing.        OT Diagnosis:     OT Problem List:     OT Treatment/Interventions:      OT Goals(Current goals can be found in the care plan section) Acute Rehab OT Goals Patient Stated Goal: No OT goals needed OT Goal Formulation: With patient  OT Frequency:     Barriers to D/C:            Co-evaluation              End of Session    Activity Tolerance: Patient tolerated treatment well Patient left: in bed;with call bell/phone within reach;with bed alarm set   Time: 5102-5852 OT Time Calculation (min): 20 min Charges:  OT General Charges $OT Visit: 1 Procedure OT Evaluation $Initial OT Evaluation Tier I: 1 Procedure G-Codes:     Bea Graff Doug Bucklin, MS, OTR/L [place] [phone] 11/06/2014, 9:39 AM

## 2014-11-06 NOTE — Progress Notes (Signed)
INITIAL NUTRITION ASSESSMENT  INTERVENTION: Agree with Ensure Complete po BID, each supplement provides 350 kcal and 13 grams of protein   NUTRITION DIAGNOSIS: Inadequate oral intake related to pelvic fx?? evidenced by decreased appetite and weight loss 9% following acute injury in October.  Goal: Pt to meet >/= 90% of their estimated nutrition needs    Monitor:  Percent po intake (meals and supplements), labs and weights  Reason for Assessment: Malnutrition Screen   80 y.o. male  Admitting Dx: TIA (transient ischemic attack)  ASSESSMENT: Pt presents with dizziness and right arm pain. Possible TID/CVA.  Pt and his spouse are is resting and daughter (their primary caregiver) stepped out to provide hx. He is weighed daily at home (every morning). Hx of CHF. At home he reluctantly follows a no added salt diet and daughter says this is difficult for pt to adhere to. He has experienced weight loss following fall (with pelvic fx) in October. His appetite had been poor for 4-6 weeks but had recently been improving. He ate 100% of breakfast this morning and had an egg salad sandwich from [employer] for lunch. Daughter is aware of increased protein needs and has been providing pt oral nutrition supplement daily. Nutrition focused exam deferred at this time (pt is asleep and daughter doesn't want him awakened).   Height: Ht Readings from Last 1 Encounters:  11/04/14 5\' 6"  (1.676 m)    Weight: Wt Readings from Last 1 Encounters:  11/05/14 159 lb 15.9 oz (72.573 kg)    Ideal Body Weight: 142#   % Ideal Body Weight: 113%  Wt Readings from Last 10 Encounters:  11/05/14 159 lb 15.9 oz (72.573 kg)  10/24/14 161 lb 9.6 oz (73.301 kg)  09/10/14 175 lb (79.379 kg)  08/03/14 178 lb (80.74 kg)  07/05/14 180 lb 4 oz (81.761 kg)  04/13/14 184 lb 3.2 oz (83.553 kg)  03/23/14 182 lb 3.2 oz (82.645 kg)  02/02/14 182 lb 1.9 oz (82.609 kg)  12/20/13 180 lb 12.8 oz (82.01 kg)  09/27/13 176 lb  (79.833 kg)    Usual Body Weight: 175-180#  % Usual Body Weight: 91%  BMI:  Body mass index is 25.84 kg/(m^2). overweight  Estimated Nutritional Needs: Kcal: 1800-2100 Protein: 85-90 gr Fluid: 1.8-2.1 liters daily  Skin: intact  Diet Order: Diet Heart  EDUCATION NEEDS: -No education needs identified at this time   Intake/Output Summary (Last 24 hours) at 11/06/14 1432 Last data filed at 11/06/14 0900  Gross per 24 hour  Intake    360 ml  Output      0 ml  Net    360 ml    Last BM: 12/27  Labs:   Recent Labs Lab 11/04/14 0958 11/04/14 1223 11/06/14 0637  NA 140 138 140  K 4.2 4.5 3.9  CL 107 104 107  CO2 27  --  27  BUN 17 18 18   CREATININE 1.05 1.00 0.92  CALCIUM 9.2  --  8.7  GLUCOSE 145* 87 85    CBG (last 3)  No results for input(s): GLUCAP in the last 72 hours.  Scheduled Meds: . aspirin  81 mg Oral Daily  . clopidogrel  75 mg Oral Daily  . famotidine  20 mg Oral QHS  . feeding supplement (ENSURE COMPLETE)  237 mL Oral BID BM  . heparin  5,000 Units Subcutaneous 3 times per day  . magnesium oxide  400 mg Oral Daily  . mirtazapine  7.5 mg Oral QHS  Continuous Infusions:   Past Medical History  Diagnosis Date  . Paroxysmal atrial fibrillation   . CAD (coronary artery disease)     CABG 08/1973  . CHF (congestive heart failure)     ICM  . Pacemaker 09/2003    RESULTS:  This demonstrates successful implantation of a [employer] dual  . Hypertension   . Hyperlipidemia   . Unsteady gait     due to B ankle DJD and instability  . Memory loss   . Chronic renal insufficiency   . Barrett's esophagus   . Diverticulosis of colon   . GERD (gastroesophageal reflux disease)   . Adenocarcinoma of prostate 10/2005    lupron injections q64mo  . Atrial fibrillation     chronic anticoag  . Arthritis     ?rheumatoid, never dx or on DMARDs  . Osteoporosis     Past Surgical History  Procedure Laterality Date  . Pacemaker placement  09/2003  .  Hernia repair      age 59 ( not sure if it was right or left side)  . Coronary artery bypass graft  08/1973    3 heart heart arteries were 80 percent clogged. the lower valve of my heart is not functioning  . Doppler echocardiography  2004, 2008  . Pacemaker generator change N/A 05/02/2013    Procedure: PACEMAKER GENERATOR CHANGE;  Surgeon: Evans Lance, MD;  Location[facility] CATH LAB;  Service: Cardiovascular;  Laterality: N/A;    Colman Cater MS,RD,CSG,LDN Office: [phone] Pager: [phone]

## 2014-11-06 NOTE — Progress Notes (Signed)
PROGRESS NOTE  [name]:[id number] DOB: 07-26-1917 DOA: 11/04/2014 PCP: Gwendolyn Grant, MD Cardiologist Dr. Johnsie Cancel  Summary: 80 year old man with multiple medical problems including atrial fibrillation not on anticoagulation who presented with history of gait unsteadiness, dizziness and right arm pain with numbness and tingling. Initial evaluation included CT of the head which was unremarkable and mildly elevated troponin of unclear significance. Admitted for possible TIA versus CVA, Plavix added.  Assessment/Plan: 1. Lightheadedness with right arm pain present on admission. Resolved. Orthostatics were negative after admission. Significance unclear. May be chronic. Today he reports he fell sometime back injuring his right arm. Shoulder x-ray was negative. He has no pain now. 2. Possible TIA. No focal deficits noted. Evaluated by physical and occupational therapy with no deficits. Aspirin was changed to Plavix. CT head negative. Carotid ultrasound unremarkable, neurology unavailable this week. Echocardiogram as below.Marland Kitchen No MRI secondary to pacemaker. Hold statin as below. 3. Positive troponin with history of right arm pain. Felt to be trivial per cardiology. Normal systolic function. Wall motion abnormality noted. Given history of frequent falls as well as subdural hematoma earlier this year, no anticoagulation. No further evaluation suggested by cardiology. Cannot tolerate beta blocker secondary to low normal blood pressure. 4. Elevated alkaline phosphatase, AST, ALT of unclear significance, improved today. No abdominal symptoms. Recommend outpatient follow-up. Given lack of symptoms, no imaging indicated at this time. Hold statin at this time. 5. Atrial fibrillation not on anticoagulation secondary to gait instability, frequent falls, advanced age. 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 6. History of coronary artery disease, status post pacemaker 7. History of pelvic  fracture recently 8. Bilateral ankle braces secondary to lack of cartilage in the ankles   Overall doing well. No neurologic deficits. No chest pain or shortness of breath. Evaluated by cardiology with medical management recommended.  Pacemaker to be interrogated, as can be done as an outpatient, discussed with Dr. Harrington Challenger she will follow this up.  Recommend using a wheelchair and urinal to avoid falling.  Right arm complaints, x-ray negative, pain resolved. Probably muscular in nature. Stop statin per cardiology. Stop lisinopril.  Follow-up LFTs as an outpatient.  Updated son Acey Woodfield by telephone, reviewed clinical course, happy to take patient home today.  Murray Hodgkins, MD  [facility]  Pager [phone] If 7PM-7AM, please contact night-coverage at [email or web address], password [facility] 11/06/2014, 3:21 PM  LOS: 2 days   Consultants:  Physical therapy: No follow-up needed. Noted to have strength and coordination within normal limits.  Occupational therapy: Felt to have normal strength and range of motion movement bilateral upper extremities. No further services required.  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  Antibiotics:    HPI/Subjective: Evaluated by cardiology. Medical management recommended.  Feels good, no complaints "I want to go home"  Sleeping: well Eating/GI: well, no n/v. No abdominal pain. Breathing: well Pain: none, no right arm pain Neuro: no weakness or numbness Desires: home  Objective: Filed Vitals:   11/05/14 2019 11/06/14 0201 11/06/14 0646 11/06/14 0942  BP: 96/53 115/70 116/58 98/63  Pulse: 60 78 67 62  Temp: 98.2 F (36.8 C) 98.2 F (36.8 C)  97.4 F (36.3 C) 97.6 F (36.4 C)  TempSrc: Oral Oral Oral   Resp: 18 18 18  18  Height:      Weight:      SpO2: 93% 100% 97% 99%    Intake/Output Summary (Last 24 hours) at 11/06/14 1521 Last data filed at 11/06/14 0900  Gross per 24 hour  Intake    360 ml  Output      0 ml  Net    360 ml     Filed Weights   11/04/14 0958 11/04/14 1547 11/05/14 0428  Weight: 72.122 kg (159 lb) 72.802 kg (160 lb 8 oz) 72.573 kg (159 lb 15.9 oz)    Exam:     Afebrile, vital signs are stable. No hypoxia.  Gen. Appears calm, comfortable.  Alert. Speech fluent and clear.  Cardiovascular regular rate and rhythm. No murmur, rub or gallop. No lower extremity edema.  Respiratory clear to auscultation bilaterally. No wheezes, rales or rhonchi. Normal respiratory effort.  Abdomen soft, nontender, nondistended.  Musculoskeletal. Excellent tone and strength all extremities. No focal deficits noted.  Cranial nerves appear intact. No pronator drift. No pass pointing upper extremities.  Data Reviewed:  Right shoulder x-ray with evidence of degenerative changes, osteopenia. No acute findings.   Basic metabolic panel unremarkable.  Elevated alkaline phosphatase, AST, ALT trending downwards.  Pertinent data:  LDL 117  Troponin trend 0.16 >> 0.22 >> 0.14  Alkaline phosphatase, AST, ALT elevated on admission, Trending downwards   CT head no acute abnormality. 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 Chronic small vessel ischemic disease suspected  EKG abnormal, sinus rhythm with inferolateral T-wave inversion, nonspecific intraventricular conduction delay; compared to previous EKG 09/16/2014 T-wave inversion and conduction delay are old. Most recent EKG 1552 12/26 normal sinus rhythm, resolution of T-wave inversion question spurious  Pending data:    Scheduled Meds: . aspirin  81 mg Oral Daily  . clopidogrel  75 mg Oral Daily  . famotidine  20 mg Oral QHS  . feeding supplement (ENSURE  COMPLETE)  237 mL Oral BID BM  . heparin  5,000 Units Subcutaneous 3 times per day  . magnesium oxide  400 mg Oral Daily  . mirtazapine  7.5 mg Oral QHS   Continuous Infusions:   Principal Problem:   TIA (transient ischemic attack) Active Problems:   Essential hypertension   ATRIAL FIBRILLATION, PAROXYSMAL   PACEMAKER, PERMANENT   Right arm pain   Elevated troponin   Elevated alkaline phosphatase level   Transaminitis

## 2014-11-06 NOTE — Consult Note (Signed)
CARDIOLOGY CONSULT NOTE   Patient ID: [name] MRN: [id number] DOB/AGE: Apr 01, 1917 80 y.o.  Admit Date: 11/04/2014 Referring Physician: PTH-Goodrich MD Primary Physician: Gwendolyn Grant, MD Consulting Cardiologist: Dorris Carnes MD Primary Cardiologist: Jenkins Rouge MD Reason for Consultation: Positive Troponin with known CAD   Clinical Summary [name] is a 80 y.o.male with known history of atrial fib on coumadin, CAD S/P 6 vessel CABG, combined CHF with ICM, (no echo report on review of past medical records as far back as 2004), has recent fall with sacral fracture, admitted with dizziness, right arm pain with clenching and unclenching right fist, numbness and tingling. Admitted with possible TIA.   In ER, BP 121/84, HR 74 bpm, Troponin positive 0.22, with subsequent troponin 0.16, elevated AST and ALT. CT of brain Minor carotid atherosclerosis. No hemodynamically significant ICAstenosis. Degree of narrowing less than 50% bilaterally.CT head, no acute intracranial abnormality. EKG with T-wave inversion infero/lateral with intraventricular conduction delay. We are asked for cardiac recommendations for elevated troponin with known CAD.   After speaking with the patient, he states his right arm has bothered him for "a long time, since falling out of his wheelchair."  He apparently was reaching for clothes from his dresser and fell out of the chair on the right side. He has had soreness in the shoulder and arm since then. He states he is always "wobbly" but also has had waves of dizziness at rest.  No Known Allergies  Medications Scheduled Medications: . aspirin  81 mg Oral Daily  . clopidogrel  75 mg Oral Daily  . famotidine  20 mg Oral QHS  . feeding supplement (ENSURE COMPLETE)  237 mL Oral BID BM  . heparin  5,000 Units Subcutaneous 3 times per day  . magnesium oxide  400 mg Oral Daily  . mirtazapine  7.5 mg Oral QHS  . pravastatin  10 mg Oral q1800    Infusions:     PRN Medications: senna-docusate   Past Medical History  Diagnosis Date  . Paroxysmal atrial fibrillation   . CAD (coronary artery disease)     CABG 08/1973  . CHF (congestive heart failure)     ICM  . Pacemaker 09/2003    RESULTS:  This demonstrates successful implantation of a Medtronic dual  . Hypertension   . Hyperlipidemia   . Unsteady gait     due to B ankle DJD and instability  . Memory loss   . Chronic renal insufficiency   . Barrett's esophagus   . Diverticulosis of colon   . GERD (gastroesophageal reflux disease)   . Adenocarcinoma of prostate 10/2005    lupron injections q40mo  . Atrial fibrillation     chronic anticoag  . Arthritis     ?rheumatoid, never dx or on DMARDs  . Osteoporosis     Past Surgical History  Procedure Laterality Date  . Pacemaker placement  09/2003  . Hernia repair      age 17 ( not sure if it was right or left side)  . Coronary artery bypass graft  08/1973    3 heart heart arteries were 80 percent clogged. the lower valve of my heart is not functioning  . Doppler echocardiography  2004, 2008  . Pacemaker generator change N/A 05/02/2013    Procedure: PACEMAKER GENERATOR CHANGE;  Surgeon: Evans Lance, MD;  Location: [facility] CATH LAB;  Service: Cardiovascular;  Laterality: N/A;    Family History  Problem Relation Age of Onset  .  Cancer Mother   . Heart attack Father     Social History [name] reports that he quit smoking about 28 years ago. He does not have any smokeless tobacco history on file. [name] reports that he drinks alcohol.  Review of Systems Complete review of systems are found to be negative unless outlined in H&P above.  Physical Examination Blood pressure 98/63, pulse 62, temperature 97.6 F (36.4 C), temperature source Oral, resp. rate 18, height 5\' 6"  (1.676 m), weight 159 lb 15.9 oz (72.573 kg), SpO2 99 %.  Intake/Output Summary (Last 24 hours) at 11/06/14 1103 Last data filed at 11/06/14 0900  Gross per  24 hour  Intake    480 ml  Output      0 ml  Net    480 ml    Telemetry: NSR with pacing  GEN: Awake and alert. No acute distress  HEENT: Conjunctiva and lids normal, oropharynx clear with moist mucosa. Neck: Supple, no elevated JVP or carotid bruits, no thyromegaly. Lungs: Inspiratory and Expiratory rhonchi, no wheezes. Cardiac: Regular rate and rhythm, 1/6 systolic murmur. no S3  no pericardial rub. Abdomen: Soft, nontender, no hepatomegaly, bowel sounds present, no guarding or rebound. Extremities: No pitting edema, distal pulses 2+.Ankles are not edematous, but are enlarged.  Skin: Warm and dry. Musculoskeletal: No kyphosis. Neuropsychiatric: Alert and oriented x3, affect grossly appropriate.  Prior Cardiac Testing/Procedures 1. Cardiac Cath 10/02/2003 Severe three vessel coronary artery disease. Occluded saphenous vein graft to the right coronary artery. The patient will be managed medically due to the late presentation. He does still have some high degree heart block and will be watched in telemetry. If this does not recover we may need to consider pacemaker placement. He will be managed with aggressive secondary risk reduction  2. PPM Implant:10/09/2003  This demonstrates successful implantation of a Medtronic dual chamber pacemaker in a patient with intermittent complete heart block and underlying sinus bradycardia.  3. PPM 05/02/2013 Removal of previously implanted dual-chamber pacemaker, which had reached elective replacement and insertion of a new dual-chamber device.Medtronic Maynard dual-chamber pacemaker   PPM Check 05/08/2014 Pacemaker check in clinic. Normal device function. Thresholds, sensing, impedances consistent with previous measurements. Device programmed to maximize longevity. 18.4% mode switched, - coumadin due to fall risk. 11 high ventricular rates noted 2-6  seconds. Device programmed at appropriate safety margins. Histogram distribution  appropriate for patient activity level. Device programmed to optimize intrinsic conduction. Estimated longevity 11.5 years. Patient enrolled in remote follow-up/TTM's with  [employer]. Plan to follow every 3 months remotely and see annually in office.   Lab Results  Basic Metabolic Panel:  Recent Labs Lab 11/04/14 0958 11/04/14 1223 11/06/14 0637  NA 140 138 140  K 4.2 4.5 3.9  CL 107 104 107  CO2 27  --  27  GLUCOSE 145* 87 85  BUN 17 18 18   CREATININE 1.05 1.00 0.92  CALCIUM 9.2  --  8.7    Liver Function Tests:  Recent Labs Lab 11/04/14 0958 11/05/14 1051 11/06/14 0637  AST 91* 62* 53*  ALT 112* 82* 73*  ALKPHOS 299* 270* 265*  BILITOT 0.6 0.7 0.7  PROT 6.1 5.2* 5.2*  ALBUMIN 3.4* 3.1* 3.0*    CBC:  Recent Labs Lab 11/04/14 0958 11/04/14 1223  WBC 7.8  --   NEUTROABS 4.6  --   HGB 12.9* 12.2*  HCT 40.2 36.0*  MCV 93.1  --   PLT 264  --     Cardiac Enzymes:  Recent Labs Lab 11/04/14 1325 11/05/14 1051  TROPONINI 0.22* 0.14*    Radiology: Ct Head Wo Contrast  11/04/2014   CLINICAL DATA:  Right arm pain and dizziness.  EXAM: CT HEAD WITHOUT CONTRAST  TECHNIQUE: Contiguous axial images were obtained from the base of the skull through the vertex without contrast.  COMPARISON:  03/16/2014  FINDINGS: Stable encephalomalacia in the left temporal lobe. Mild atrophy is unchanged. Stable low-density throughout the subcortical and periventricular white matter. No evidence for acute hemorrhage, mass lesion, midline shift, hydrocephalus or large new infarct. Stable changes compatible with previous left mastoid surgery. The visualized paranasal sinuses are clear. No acute bone abnormality.  IMPRESSION: No acute intracranial abnormality.  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  Stable white matter changes are most likely related to chronic small vessel ischemic disease.   Electronically Signed   By: Markus Daft M.D.   On: 11/04/2014 12:29   US Carotid  Bilateral  11/05/2014   CLINICAL DATA:  Stroke symptoms, right-sided weakness, paresthesias, hyperlipidemia.  EXAM: BILATERAL CAROTID DUPLEX ULTRASOUND  TECHNIQUE: Pearline Cables scale imaging, color Doppler and duplex ultrasound were performed of bilateral carotid and vertebral arteries in the neck.  COMPARISON:  None.  FINDINGS: Criteria: Quantification of carotid stenosis is based on velocity parameters that correlate the residual internal carotid diameter with NASCET-based stenosis levels, using the diameter of the distal internal carotid lumen as the denominator for stenosis measurement.  The following velocity measurements were obtained:  RIGHT  ICA:  51/14 cm/sec  CCA:  46/9 cm/sec  SYSTOLIC ICA/CCA RATIO:  0.7  DIASTOLIC ICA/CCA RATIO:  2.3  ECA:  82 cm/sec  LEFT  ICA:  67/14 cm/sec  CCA:  62/9 cm/sec  SYSTOLIC ICA/CCA RATIO:  1.1  DIASTOLIC ICA/CCA RATIO:  2.5  ECA:  96 cm/sec  RIGHT CAROTID ARTERY: Minor echogenic shadowing plaque formation. No hemodynamically significant right ICA stenosis, velocity elevation, or turbulent flow. Degree of narrowing less than 50%.  RIGHT VERTEBRAL ARTERY:  Antegrade  LEFT CAROTID ARTERY: Similar scattered minor echogenic plaque formation. No hemodynamically significant left ICA stenosis, velocity elevation, or turbulent flow.  LEFT VERTEBRAL ARTERY:  Antegrade  IMPRESSION: Minor carotid atherosclerosis. No hemodynamically significant ICA stenosis. Degree of narrowing less than 50% bilaterally.   Electronically Signed   By: Daryll Brod M.D.   On: 11/05/2014 11:31     ECG: NSR with PVC vs pacing.    Impression and Recommendations  1. Positive Troponin: Patient with known CAD. Trivial elevation.  Trending downward. EKG does not show ACS. Agree with the echo for medical management.   2. Dizziness: Will have PPM checked to evaluate for rapid Afib in the setting of known PAF. No coumadin in the setting of frequent falls. Orthostatics were negative yesterday but baseline BP  around 100/  He is at risk for falls. I have talked to him about using wheelchair, using urinal to avoid falling.  Agree with holding lisinopril which he had been on  3.  R arm complaints.  Resolved  [name] now on Plavix   ? TIA   ? Neuromuscular    3. HL On statin. May be having some myalgia pain vs residual pain from the fall. Stop statin fro now. Denies chest pain or DOE.   4. Deconditioning: PT/OT evaluating.   Signed: Phill Myron. Lawrence NP [facility]  11/06/2014, 11:03 AM    [name] seen and examined  I have amended note above by Arnold Long to relfect my findings.  Will continue to follow.   Dorris Carnes

## 2014-11-06 NOTE — Progress Notes (Signed)
Patient was schedule for MRI,however due to patient having an implanted pacemaker,Medtronic was contacted this morning for compatibility ,I was informed that it was not compatibility,this report came from Vanetta Shawl a Oceanographer at [employer]. Will notify attending MD.

## 2014-11-06 NOTE — Progress Notes (Signed)
Patient discharged with instructions given on medications,and follow up visits,patient and son,verbalized understanding. No c/o pain or discomfort noted. Prescriptions sent to Pharmacy of choice documented .Accompanied by staff to an awaiting vehicle.

## 2014-11-06 NOTE — Care Management Note (Signed)
    Page 1 of 1   11/06/2014     1:24:03 PM CARE MANAGEMENT NOTE 11/06/2014  Patient:  [name], [name]   Account Number:  [id number]  Date Initiated:  11/06/2014  Documentation initiated by:  Jolene Provost  Subjective/Objective Assessment:   Pt is from home, lives with wife and daughter. Pt previsouly independent with ADL's. Pt has used [facility] following a fall in October. Pt request using [facility] again. Pt has cane, rollator, 3 in 1 and stairlift at home.     Action/Plan:   Romualdo Bolk of [facility] made aware of referral. Vaughan Basta will obtain pt information from the chart. Pt plans to discharge home with [facility] RN/PT/Aid today or tomorrow. Pt has no further CM needs at this time.   Anticipated DC Date:  11/06/2014   Anticipated DC Plan:  [place]  CM consult      [facility] Choice  HOME HEALTH   Choice offered to / List presented to:  C-1 Patient        [place] arranged  HH-1 RN  HH-2 PT  HH-4 NURSE'S AIDE      Status of service:  Completed, signed off Medicare Important Message given?   (If response is "NO", the following Medicare IM given date fields will be blank) Date Medicare IM given:   Medicare IM given by:   Date Additional Medicare IM given:   Additional Medicare IM given by:    Discharge Disposition:  [place]  Per UR Regulation:    If discussed at Long Length of Stay Meetings, dates discussed:    Comments:  11/06/2014 [place], RN, MSN, [facility]

## 2014-11-06 NOTE — Discharge Summary (Signed)
Physician Discharge Summary  BRENEN BEIGEL UKG:[id number] DOB: 1917/04/29 DOA: 11/04/2014  PCP: Gwendolyn Grant, MD  Admit date: 11/04/2014 Discharge date: 11/06/2014  Recommendations for Outpatient Follow-up:  1. Possible TIA, see discussion below 2. Elevated alkaline phosphatase, AST, ALT of unclear significance, completely asymptomatic. Trending down. Statin on hold. 3. Cardiology will arrange for pacemaker interrogation as an outpatient   Follow-up Information    Follow up with Jory Sims, NP On 11/20/2014.   Specialty:  Nurse Practitioner   Why:  at 2:30 pm   Contact information:   [place] [address] [phone]       Follow up with Gwendolyn Grant, MD.   Specialty:  Internal Medicine   Contact information:   [address] [place] [address] [phone]      Discharge Diagnoses:  1. Lightheadedness 2. Possible TIA 3. Right arm pain, likely muscular 4. Positive troponin 5. Elevated alkaline phosphatase, AST, ALT of unclear significance 6. Atrial fibrillation 7. Status post pacemaker  Discharge Condition: Improved Disposition: Home  Diet recommendation: Heart healthy  Filed Weights   11/04/14 0958 11/04/14 1547 11/05/14 0428  Weight: 72.122 kg (159 lb) 72.802 kg (160 lb 8 oz) 72.573 kg (159 lb 15.9 oz)    History of present illness:  80 year old man with multiple medical problems including atrial fibrillation not on anticoagulation who presented with history of gait unsteadiness, dizziness and right arm pain with numbness and tingling. Initial evaluation included CT of the head which was unremarkable and mildly elevated troponin of unclear significance. Admitted for possible TIA versus CVA, Plavix added.  Hospital Course:   1. Lightheadedness with right arm pain present on admission. Resolved. Orthostatics were negative after admission. Significance unclear. May be chronic. Today he reports he fell  sometime back injuring his right arm. Shoulder x-ray was negative. He has no pain now. 2. Possible TIA. No focal deficits noted. Evaluated by physical and occupational therapy with no deficits. Aspirin was changed to Plavix. CT head negative. Carotid ultrasound unremarkable, neurology unavailable this week. Echocardiogram as below.Marland Kitchen No MRI secondary to pacemaker. Hold statin as below. 3. Positive troponin with history of right arm pain. Felt to be trivial per cardiology. Normal systolic function. Wall motion abnormality noted. Given history of frequent falls as well as subdural hematoma earlier this year, no anticoagulation. No further evaluation suggested by cardiology. Cannot tolerate beta blocker secondary to low normal blood pressure. Hold ACE inhibitor for same reason. 4. Elevated alkaline phosphatase, AST, ALT of unclear significance, improved today. No abdominal symptoms. Recommend outpatient follow-up. Given lack of symptoms, no imaging indicated at this time. Hold statin at this time. 5. Atrial fibrillation not on anticoagulation secondary to gait instability, frequent falls, advanced age. 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 6. History of coronary artery disease, status post pacemaker 7. History of pelvic fracture recently 8. Bilateral ankle braces secondary to lack of cartilage in the ankles   Pacemaker to be interrogated, as can be done as an outpatient, discussed with Dr. Harrington Challenger she will follow this up.  Recommend using a wheelchair and urinal to avoid falling.  Low normal blood pressure, asymptomatic. Ambulated to the bathroom without difficulty. No signs or symptoms of acute illness. Plan discharge home. Stop antihypertensives and diuretic.  Consultants:  Physical therapy: No follow-up needed. Noted to have strength and coordination within normal limits.  Occupational therapy: Felt to have normal strength and range of motion movement bilateral upper extremities.  No  further services required.  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  Discharge Instructions  Discharge Instructions    Diet - low sodium heart healthy    Complete by:  As directed      Discharge instructions    Complete by:  As directed   Call physician or seek immediate medical attention for fall, pain, weakness or worsening of condition. Recommend supervision when walking.     Increase activity slowly    Complete by:  As directed           Current Discharge Medication List    START taking these medications   Details  clopidogrel (PLAVIX) 75 MG tablet Take 1 tablet (75 mg total) by mouth daily. Qty: 30 tablet, Refills: 0      CONTINUE these medications which have NOT CHANGED   Details  acetaminophen (TYLENOL) 325 MG tablet Take 650 mg by mouth every 6 (six) hours as needed for mild pain.    Magnesium 500 MG CAPS Take 1 capsule by mouth daily. Take one by mouth daily    mirtazapine (REMERON) 7.5 MG tablet Take 1 tablet (7.5 mg total) by mouth at bedtime. Qty: 30 tablet, Refills: 3    Misc Natural Products (LUTEIN 20 PO) Take 1 tablet by mouth daily.     nitroGLYCERIN (NITROSTAT) 0.4 MG SL tablet Place 1 tablet (0.4 mg total) under the tongue every 5 (five) minutes as needed. Qty: 25 tablet, Refills: 1    ranitidine (ZANTAC) 150 MG tablet Take 150 mg by mouth at bedtime.    vitamin C (ASCORBIC ACID) 500 MG tablet Take 1 tablet (500 mg total) by mouth daily.      STOP taking these medications     aspirin EC 325 MG tablet      benazepril (LOTENSIN) 5 MG tablet      furosemide (LASIX) 40 MG tablet      potassium chloride SA (K-DUR,KLOR-CON) 20 MEQ tablet      pravastatin  (PRAVACHOL) 10 MG tablet      benazepril (LOTENSIN) 5 MG tablet      cyclobenzaprine (FLEXERIL) 10 MG tablet        No Known Allergies  The results of significant diagnostics from this hospitalization (including imaging, microbiology, ancillary and laboratory) are listed below for reference.    Significant Diagnostic Studies: Dg Shoulder Right  11/06/2014   CLINICAL DATA:  Right shoulder pain for several days. No known injury.  EXAM: RIGHT SHOULDER - 2+ VIEW  COMPARISON:  None.  FINDINGS: There is no evidence of fracture or dislocation.  Mild degenerative spurring is seen involving the acromioclavicular and glenohumeral joints. Generalized osteopenia noted. No other focal bone lesions identified.  IMPRESSION: No acute findings.  Glenohumeral and acromioclavicular degenerative changes.  Osteopenia.   Electronically Signed   By: Earle Gell M.D.   On: 11/06/2014 14:59   Ct Head Wo Contrast  11/04/2014   CLINICAL DATA:  Right arm pain and dizziness.  EXAM: CT HEAD WITHOUT CONTRAST  TECHNIQUE: Contiguous axial images were obtained from the base of the skull through the vertex without contrast.  COMPARISON:  03/16/2014  FINDINGS: Stable encephalomalacia in the left temporal lobe. Mild atrophy is unchanged. Stable low-density throughout the subcortical and periventricular white  matter. No evidence for acute hemorrhage, mass lesion, midline shift, hydrocephalus or large new infarct. Stable changes compatible with previous left mastoid surgery. The visualized paranasal sinuses are clear. No acute bone abnormality.  IMPRESSION: No acute intracranial abnormality.  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  Stable white matter changes are most likely related to chronic small vessel ischemic disease.   Electronically Signed   By: Markus Daft M.D.   On: 11/04/2014 12:29   US Carotid Bilateral  11/05/2014   CLINICAL DATA:  Stroke symptoms, right-sided weakness, paresthesias, hyperlipidemia.   EXAM: BILATERAL CAROTID DUPLEX ULTRASOUND  TECHNIQUE: Pearline Cables scale imaging, color Doppler and duplex ultrasound were performed of bilateral carotid and vertebral arteries in the neck.  COMPARISON:  None.  FINDINGS: Criteria: Quantification of carotid stenosis is based on velocity parameters that correlate the residual internal carotid diameter with NASCET-based stenosis levels, using the diameter of the distal internal carotid lumen as the denominator for stenosis measurement.  The following velocity measurements were obtained:  RIGHT  ICA:  51/14 cm/sec  CCA:  48/2 cm/sec  SYSTOLIC ICA/CCA RATIO:  0.7  DIASTOLIC ICA/CCA RATIO:  2.3  ECA:  82 cm/sec  LEFT  ICA:  67/14 cm/sec  CCA:  50/0 cm/sec  SYSTOLIC ICA/CCA RATIO:  1.1  DIASTOLIC ICA/CCA RATIO:  2.5  ECA:  96 cm/sec  RIGHT CAROTID ARTERY: Minor echogenic shadowing plaque formation. No hemodynamically significant right ICA stenosis, velocity elevation, or turbulent flow. Degree of narrowing less than 50%.  RIGHT VERTEBRAL ARTERY:  Antegrade  LEFT CAROTID ARTERY: Similar scattered minor echogenic plaque formation. No hemodynamically significant left ICA stenosis, velocity elevation, or turbulent flow.  LEFT VERTEBRAL ARTERY:  Antegrade  IMPRESSION: Minor carotid atherosclerosis. No hemodynamically significant ICA stenosis. Degree of narrowing less than 50% bilaterally.   Electronically Signed   By: Daryll Brod M.D.   On: 11/05/2014 11:31    Labs: Basic Metabolic Panel:  Recent Labs Lab 11/04/14 0958 11/04/14 1223 11/06/14 0637  NA 140 138 140  K 4.2 4.5 3.9  CL 107 104 107  CO2 27  --  27  GLUCOSE 145* 87 85  BUN 17 18 18   CREATININE 1.05 1.00 0.92  CALCIUM 9.2  --  8.7   Liver Function Tests:  Recent Labs Lab 11/04/14 0958 11/05/14 1051 11/06/14 0637  AST 91* 62* 53*  ALT 112* 82* 73*  ALKPHOS 299* 270* 265*  BILITOT 0.6 0.7 0.7  PROT 6.1 5.2* 5.2*  ALBUMIN 3.4* 3.1* 3.0*   CBC:  Recent Labs Lab 11/04/14 0958 11/04/14 1223    WBC 7.8  --   NEUTROABS 4.6  --   HGB 12.9* 12.2*  HCT 40.2 36.0*  MCV 93.1  --   PLT 264  --    Cardiac Enzymes:  Recent Labs Lab 11/04/14 1325 11/05/14 1051  TROPONINI 0.22* 0.14*    Principal Problem:   TIA (transient ischemic attack) Active Problems:   Essential hypertension   ATRIAL FIBRILLATION, PAROXYSMAL   PACEMAKER, PERMANENT   Right arm pain   Elevated troponin   Elevated alkaline phosphatase level   Transaminitis   Time coordinating discharge: 45 minutes  Signed:  Murray Hodgkins, MD [facility] 11/06/2014, 4:38 PM

## 2014-11-07 ENCOUNTER — Telehealth: Payer: Self-pay | Admitting: Internal Medicine

## 2014-11-07 NOTE — Telephone Encounter (Signed)
Pt request to transfer from Dr. Asa Lente to Dr. Doug Sou. Please advise.

## 2014-11-07 NOTE — Telephone Encounter (Signed)
Fine with me

## 2014-11-08 NOTE — Telephone Encounter (Signed)
[place] with me - thanks! Changed in care team to new PCP

## 2014-11-08 NOTE — Telephone Encounter (Signed)
Pt daughter aware

## 2014-11-10 DIAGNOSIS — I480 Paroxysmal atrial fibrillation: Secondary | ICD-10-CM | POA: Diagnosis not present

## 2014-11-10 DIAGNOSIS — H9190 Unspecified hearing loss, unspecified ear: Secondary | ICD-10-CM | POA: Diagnosis not present

## 2014-11-10 DIAGNOSIS — Z8673 Personal history of transient ischemic attack (TIA), and cerebral infarction without residual deficits: Secondary | ICD-10-CM | POA: Diagnosis not present

## 2014-11-10 DIAGNOSIS — M79621 Pain in right upper arm: Secondary | ICD-10-CM | POA: Diagnosis not present

## 2014-11-10 DIAGNOSIS — Z950 Presence of cardiac pacemaker: Secondary | ICD-10-CM | POA: Diagnosis not present

## 2014-11-11 ENCOUNTER — Other Ambulatory Visit: Payer: Self-pay | Admitting: Internal Medicine

## 2014-11-14 DIAGNOSIS — Z8673 Personal history of transient ischemic attack (TIA), and cerebral infarction without residual deficits: Secondary | ICD-10-CM | POA: Diagnosis not present

## 2014-11-14 DIAGNOSIS — Z950 Presence of cardiac pacemaker: Secondary | ICD-10-CM | POA: Diagnosis not present

## 2014-11-14 DIAGNOSIS — I480 Paroxysmal atrial fibrillation: Secondary | ICD-10-CM | POA: Diagnosis not present

## 2014-11-14 DIAGNOSIS — H9190 Unspecified hearing loss, unspecified ear: Secondary | ICD-10-CM | POA: Diagnosis not present

## 2014-11-14 DIAGNOSIS — M79621 Pain in right upper arm: Secondary | ICD-10-CM | POA: Diagnosis not present

## 2014-11-16 ENCOUNTER — Ambulatory Visit (INDEPENDENT_AMBULATORY_CARE_PROVIDER_SITE_OTHER): Payer: Medicare Other | Admitting: Internal Medicine

## 2014-11-16 ENCOUNTER — Encounter: Payer: Self-pay | Admitting: Internal Medicine

## 2014-11-16 VITALS — BP 110/66 | HR 78 | Ht 66.0 in | Wt 164.6 lb

## 2014-11-16 DIAGNOSIS — I10 Essential (primary) hypertension: Secondary | ICD-10-CM

## 2014-11-16 DIAGNOSIS — I4891 Unspecified atrial fibrillation: Secondary | ICD-10-CM | POA: Diagnosis not present

## 2014-11-16 DIAGNOSIS — I480 Paroxysmal atrial fibrillation: Secondary | ICD-10-CM | POA: Diagnosis not present

## 2014-11-16 DIAGNOSIS — Z950 Presence of cardiac pacemaker: Secondary | ICD-10-CM | POA: Diagnosis not present

## 2014-11-16 LAB — MDC_IDC_ENUM_SESS_TYPE_INCLINIC
Battery Impedance: 148 Ohm
Battery Remaining Longevity: 127 mo
Battery Voltage: 2.79 V
Brady Statistic AP VP Percent: 6 %
Brady Statistic AS VP Percent: 5 %
Date Time Interrogation Session: 20160107155638
Lead Channel Impedance Value: 694 Ohm
Lead Channel Pacing Threshold Amplitude: 1 V
Lead Channel Sensing Intrinsic Amplitude: 11.2 mV
Lead Channel Sensing Intrinsic Amplitude: 2 mV
Lead Channel Setting Pacing Amplitude: 2.5 V
Lead Channel Setting Pacing Pulse Width: 0.4 ms
MDC IDC MSMT LEADCHNL RA IMPEDANCE VALUE: 460 Ohm
MDC IDC MSMT LEADCHNL RA PACING THRESHOLD PULSEWIDTH: 0.4 ms
MDC IDC MSMT LEADCHNL RV PACING THRESHOLD AMPLITUDE: 1.25 V
MDC IDC MSMT LEADCHNL RV PACING THRESHOLD PULSEWIDTH: 0.4 ms
MDC IDC SET LEADCHNL RA PACING AMPLITUDE: 2 V
MDC IDC SET LEADCHNL RV SENSING SENSITIVITY: 5.6 mV
MDC IDC STAT BRADY AP VS PERCENT: 7 %
MDC IDC STAT BRADY AS VS PERCENT: 82 %

## 2014-11-16 NOTE — Patient Instructions (Signed)
Your physician wants you to follow-up in: 12 months with Dr Knox Saliva will receive a reminder letter in the mail two months in advance. If you don't receive a letter, please call our office to schedule the follow-up appointment.  Remote monitoring is used to monitor your Pacemaker of ICD from home. This monitoring reduces the number of office visits required to check your device to one time per year. It allows [place] to keep an eye on the functioning of your device to ensure it is working properly. You are scheduled for a device check from home on 02/15/15. You may send your transmission at any time that day. If you have a wireless device, the transmission will be sent automatically. After your physician reviews your transmission, you will receive a postcard with your next transmission date.

## 2014-11-16 NOTE — Assessment & Plan Note (Signed)
His blood pressure is well controlled. He'll continue his current medications.

## 2014-11-16 NOTE — Assessment & Plan Note (Signed)
His Medtronic dual-chamber pacemaker is working normally. We'll plan to recheck in several months.

## 2014-11-16 NOTE — Progress Notes (Deleted)
HPI [name] returns today for followup. He is a very pleasant 81 year old man with a history of symptomatic bradycardia, status post permanent pacemaker insertion. He also is a history of hypertension, coronary disease, and prostate cancer. In the interim, he has done well. He underwent pacemaker generator change out several months ago. 4 months ago, he fractured his sacrum. It is take him several months to improve from this. One month ago, he had a TIA. The patient has been unable to take systemic anticoagulation because of frequent falls. He was placed on Plavix. He denies chest pain or shortness of breath. No syncope. No Known Allergies   Current Outpatient Prescriptions  Medication Sig Dispense Refill  . alendronate (FOSAMAX) 70 MG tablet Take 70 mg by mouth every 7 (seven) days. Take with a full glass of water on an empty stomach.     Marland Kitchen aspirin 81 MG tablet Take 81 mg by mouth daily.    . benazepril (LOTENSIN) 5 MG tablet TAKE 1/2 TABLET BY MOUTH ONCE A DAY 45 tablet 1  . furosemide (LASIX) 20 MG tablet Take 20 mg by mouth daily.     . metoprolol tartrate (LOPRESSOR) 25 MG tablet TAKE 1 TABLET (25 MG TOTAL) BY MOUTH 2 (TWO) TIMES DAILY. 60 tablet 4  . nitroGLYCERIN (NITROSTAT) 0.4 MG SL tablet Place 0.4 mg under the tongue every 5 (five) minutes as needed.     . potassium chloride (K-DUR,KLOR-CON) 10 MEQ tablet TAKE 1 TABLET (10 MEQ TOTAL) BY MOUTH DAILY. 30 tablet 4  . pravastatin (PRAVACHOL) 10 MG tablet TAKE ONE TABLET BY MOUTH EVERY EVENING 90 tablet 1  . ranitidine (ZANTAC) 150 MG tablet Take 1 tablet (150 mg total) by mouth at bedtime. 30 tablet 5  . warfarin (COUMADIN) 1 MG tablet Take 1 tablet (1 mg total) by mouth as directed. Take as directed by coumadin clinic. 45 tablet 3  . [DISCONTINUED] potassium chloride (KLOR-CON) 10 MEQ CR tablet Take 1 tablet (10 mEq total) by mouth daily. 30 tablet 3      Past Medical History  Diagnosis Date  . Paroxysmal atrial fibrillation   . CAD (coronary artery disease)     CABG 08/1973  . CHF (congestive heart failure)     ICM  . Pacemaker 09/2003  . Hypertension   . Hyperlipidemia   . Unsteady gait     due to B ankle DJD and instability  . Memory loss   . Chronic renal insufficiency   . Barrett's esophagus   . Diverticulosis of colon   . GERD (gastroesophageal reflux disease)   . Adenocarcinoma of prostate 10/2005    lupron injections q52mo  . Atrial fibrillation     chronic anticoag  . Arthritis     ?rheumatoid, never dx or on DMARDs  . Osteoporosis     ROS:  All systems reviewed and negative except as noted in the HPI.   Past Surgical History  Procedure Date  . Pacemaker placement 09/2003  . Hernia repair     age 60 ( not sure if it was right or left side)  . Coronary artery bypass graft 08/1973    3 heart heart arteries were 80 percent clogged. the lower valve of my heart is not functioning  . Doppler echocardiography 2004, 2008     No family history on file.   History   Social History  . Marital Status: Married    Spouse Name: N/A    Number of Children: N/A  .  Years of Education: N/A   Occupational History  . Not on file.   Social History Main Topics  . Smoking status: Former Smoker    Quit date: 08/05/1986  . Smokeless tobacco: Not on file  . Alcohol Use: Yes     Comment: samll glass of wine  . Drug Use: Not on file  . Sexually Active: Not on file   Other Topics Concern  . Not on file   Social History Narrative   Married - lives with spouse and supportive daugherFormer Smoker - quit tobacco 25 years ago Alcohol use-yes (small glass of wine)      BP 119/65  Pulse 68  Wt 184 lb (83.462 kg)  Physical Exam:  Elderly appearing  NAD HEENT: Unremarkable Neck: No JVD, no thyromegally Lungs: Clear with no wheezes, rales, or rhonchi. HEART: Regular rate rhythm, no murmurs, no rubs, no clicks Abd: soft, positive bowel sounds, no organomegally, no rebound, no guarding Ext: 2 plus pulses, no edema, no cyanosis, no clubbing Skin: No rashes no nodules Neuro: CN II through XII intact, motor grossly intact  DEVICE  Normal device function. See PaceArt for details. He is approaching elective replacement   Assess/Plan:             PACEMAKER, PERMANENT - Evans Lance, MD at 11/11/2012 2:07 PM     Status: Written Related Problem: PACEMAKER, PERMANENT   Expand All Collapse All   : His permanent pacemaker is working normally. We'll plan to recheck in several months. He is approaching elective replacement.            ATRIAL FIBRILLATION, PAROXYSMAL - Evans Lance, MD at 11/11/2012 2:07 PM     Status: Written Related Problem: ATRIAL FIBRILLATION, PAROXYSMAL   Expand All Collapse All   He is maintaining sinus rhythm. He'll continue his current medical therapy.

## 2014-11-16 NOTE — Assessment & Plan Note (Signed)
The patient is maintaining sinus rhythm. He will continue his current medical therapy. He is no longer a candidate for systemic anticoagulation. He has a history of falls.

## 2014-11-16 NOTE — Progress Notes (Signed)
HPI [name] returns today for followup. He is a very pleasant 81 year old man with a history of symptomatic bradycardia, status post permanent pacemaker insertion. He also is a history of hypertension, coronary disease, and prostate cancer. In the interim, he has done well. He underwent pacemaker generator change out several months ago. 4 months ago, he fractured his sacrum. It is take him several months to improve from this. One month ago, he had a TIA. The patient has been unable to take systemic anticoagulation because of frequent falls. He was placed on Plavix. He denies chest pain or shortness of breath. No syncope. No Known Allergies   Current Outpatient Prescriptions  Medication Sig Dispense Refill  . acetaminophen (TYLENOL) 325 MG tablet Take 650 mg by mouth every 6 (six) hours as needed for mild pain.    Marland Kitchen clopidogrel (PLAVIX) 75 MG tablet Take 1 tablet (75 mg total) by mouth daily. 30 tablet 0  . furosemide (LASIX) 40 MG tablet Take 40 mg by mouth as needed for edema.    . Magnesium 500 MG CAPS Take 1 capsule by mouth daily. Take one by mouth daily    . mirtazapine (REMERON) 7.5 MG tablet Take 1 tablet (7.5 mg total) by mouth at bedtime. 30 tablet 3  . Misc Natural Products (LUTEIN 20 PO) Take 1 tablet by mouth daily.     . multivitamin-lutein (OCUVITE-LUTEIN) CAPS capsule Take 1 capsule by mouth daily. 20 MG dAILY    . nitroGLYCERIN (NITROSTAT) 0.4 MG SL tablet Place 1 tablet (0.4 mg total) under the tongue every 5 (five) minutes as needed. 25 tablet 1  . potassium chloride (K-DUR) 10 MEQ tablet Take 1 tablet (10 mEq total) by mouth daily. 30 tablet 5  . ranitidine (ZANTAC) 150 MG tablet Take 150 mg by mouth at bedtime.    . vitamin C (ASCORBIC ACID) 500 MG tablet Take 1 tablet (500 mg total) by mouth daily.     No current facility-administered medications for this visit.     Past Medical History  Diagnosis Date  . Paroxysmal atrial fibrillation   . CAD (coronary  artery disease)     CABG 08/1973  . CHF (congestive heart failure)     ICM  . Pacemaker 09/2003    RESULTS:  This demonstrates successful implantation of a Medtronic dual  . Hypertension   . Hyperlipidemia   . Unsteady gait     due to B ankle DJD and instability  . Memory loss   . Chronic renal insufficiency   . Barrett's esophagus   . Diverticulosis of colon   . GERD (gastroesophageal reflux disease)   . Adenocarcinoma of prostate 10/2005    lupron injections q65mo  . Atrial fibrillation     chronic anticoag  . Arthritis     ?rheumatoid, never dx or on DMARDs  . Osteoporosis     ROS:   All systems reviewed and negative except as noted in the HPI.   Past Surgical History  Procedure Laterality Date  . Pacemaker placement  09/2003  . Hernia repair      age 35 ( not sure if it was right or left side)  . Coronary artery bypass graft  08/1973    3 heart heart arteries were 80 percent clogged. the lower valve of my heart is not functioning  . Doppler echocardiography  2004, 2008  . Pacemaker generator change N/A 05/02/2013    Procedure: PACEMAKER GENERATOR CHANGE;  Surgeon: Champ Mungo  Lovena Le, MD;  Location: [facility] CATH LAB;  Service: Cardiovascular;  Laterality: N/A;     Family History  Problem Relation Age of Onset  . Cancer Mother   . Heart attack Father      History   Social History  . Marital Status: Married    Spouse Name: N/A    Number of Children: N/A  . Years of Education: N/A   Occupational History  . Not on file.   Social History Main Topics  . Smoking status: Former Smoker    Quit date: 08/05/1986  . Smokeless tobacco: Not on file  . Alcohol Use: Yes     Comment: samll glass of wine  . Drug Use: Not on file  . Sexual Activity: Not on file   Other Topics Concern  . Not on file   Social History Narrative   Married  - lives with spouse and supportive daugher   Former Smoker -  quit tobacco 25 years ago     Alcohol use-yes (small glass of wine)                      BP 110/66 mmHg  Pulse 78  Ht 5\' 6"  (1.676 m)  Wt 164 lb 9.6 oz (74.662 kg)  BMI 26.58 kg/m2  SpO2 98%  Physical Exam:  Well appearing elderly man, NAD HEENT: Unremarkable Neck:  No JVD, no thyromegally Lymphatics:  No adenopathy Back:  No CVA tenderness Lungs:  Clear, except for rare basilar rales. No wheezes or rhonchi. Well-healed pacemaker incision. HEART:  Regular rate rhythm, no murmurs, no rubs, no clicks Abd:  soft, positive bowel sounds, no organomegally, no rebound, no guarding Ext:  2 plus pulses, no edema, no cyanosis, no clubbing Skin:  No rashes no nodules Neuro:  CN II through XII intact, motor grossly intact   DEVICE  Normal device function.  See PaceArt for details.   Assess/Plan:

## 2014-11-17 ENCOUNTER — Ambulatory Visit (INDEPENDENT_AMBULATORY_CARE_PROVIDER_SITE_OTHER): Payer: Medicare Other | Admitting: Internal Medicine

## 2014-11-17 ENCOUNTER — Encounter: Payer: Self-pay | Admitting: Internal Medicine

## 2014-11-17 VITALS — BP 110/62 | HR 92 | Temp 97.4°F | Resp 14 | Ht 66.5 in | Wt 164.6 lb

## 2014-11-17 DIAGNOSIS — G459 Transient cerebral ischemic attack, unspecified: Secondary | ICD-10-CM

## 2014-11-17 DIAGNOSIS — R269 Unspecified abnormalities of gait and mobility: Secondary | ICD-10-CM | POA: Diagnosis not present

## 2014-11-17 NOTE — Patient Instructions (Signed)
You can stop taking the remeron. You do not need to taper off you can just stop. If you notice that you are not sleeping as well or having good appetite you can start taking it again.   Come back in about 3-4 months for a check in.   If you have any new problems or questions please feel free to call us back.  Keep working with your exercise.

## 2014-11-17 NOTE — Progress Notes (Signed)
   Subjective:    Patient ID: [name], male    DOB: [birth year], 81 y.o.   MRN: [id number]  HPI The patient is a 81 YO man coming in for hospital follow up. He is doing well and keeping up with his exercises. He was switched from ASA to plavix during the hospitalization. His daughter is with him and is pleased with how he is doing. He is now able to do most ADLs by himself and has stamina back again. No falls since leaving hospital. Wants to stop remeron since his appetite is back and he is doing better. Food tastes normal again and his diet is back. He is putting back on a little bit of the weight he lost in the hospital.   Review of Systems  Constitutional: Negative for fever and chills.  Respiratory: Negative for cough, chest tightness and shortness of breath.   Cardiovascular: Negative for chest pain, palpitations and leg swelling.  Musculoskeletal: Positive for gait problem. Negative for back pain and arthralgias.  Neurological: Positive for weakness. Negative for dizziness, syncope, light-headedness, numbness and headaches.      Objective:   Physical Exam  Constitutional: He appears well-developed and well-nourished.  HENT:  Head: Normocephalic and atraumatic.  Eyes: EOM are normal.  Neck: Normal range of motion.  Cardiovascular: Normal rate.   Pulmonary/Chest: Effort normal and breath sounds normal. No respiratory distress.  Abdominal: Soft. Bowel sounds are normal.  Neurological: He is alert. Coordination abnormal.  Slow gait uses walker.  Skin: Skin is warm and dry.   Filed Vitals:   11/17/14 1426  BP: 110/62  Pulse: 92  Temp: 97.4 F (36.3 C)  TempSrc: Oral  Resp: 14  Height: 5' 6.5" (1.689 m)  Weight: 164 lb 9.6 oz (74.662 kg)  SpO2: 97%      Assessment & Plan:

## 2014-11-17 NOTE — Progress Notes (Signed)
Pre visit review using our clinic review tool, if applicable. No additional management support is needed unless otherwise documented below in the visit note. 

## 2014-11-20 ENCOUNTER — Encounter: Payer: Medicare Other | Admitting: Adult Health

## 2014-11-20 NOTE — Assessment & Plan Note (Signed)
Patient has had no problem switching from aspirin to plavix and is doing well. No symptoms of TIA since leaving the hospital. [name] through medications with them and he is taking them appropriately. He is doing well at home and going out on small trips to [employer] more often. Okay compliance with his strengthening exercises. No problems with bleeding, headaches.

## 2014-11-20 NOTE — Assessment & Plan Note (Signed)
Doing strengthening exercises daily, he is using walker 100% of the time now for stability which has helped. No falls since leaving the hospital.

## 2014-11-23 DIAGNOSIS — M79621 Pain in right upper arm: Secondary | ICD-10-CM | POA: Diagnosis not present

## 2014-11-23 DIAGNOSIS — H9190 Unspecified hearing loss, unspecified ear: Secondary | ICD-10-CM

## 2014-11-23 DIAGNOSIS — I480 Paroxysmal atrial fibrillation: Secondary | ICD-10-CM | POA: Diagnosis not present

## 2014-11-23 DIAGNOSIS — Z950 Presence of cardiac pacemaker: Secondary | ICD-10-CM | POA: Diagnosis not present

## 2014-11-23 DIAGNOSIS — Z8673 Personal history of transient ischemic attack (TIA), and cerebral infarction without residual deficits: Secondary | ICD-10-CM | POA: Diagnosis not present

## 2014-11-24 ENCOUNTER — Telehealth: Payer: Self-pay | Admitting: Internal Medicine

## 2014-11-24 ENCOUNTER — Other Ambulatory Visit: Payer: Self-pay | Admitting: Geriatric Medicine

## 2014-11-24 DIAGNOSIS — I10 Essential (primary) hypertension: Secondary | ICD-10-CM

## 2014-11-24 MED ORDER — FUROSEMIDE 40 MG PO TABS: 40.0000 mg | ORAL_TABLET | ORAL | Status: DC | PRN

## 2014-11-24 NOTE — Telephone Encounter (Signed)
Sent to pharmacy 

## 2014-11-24 NOTE — Telephone Encounter (Signed)
Is requesting refill on lasix to be sent to [facility] at [place].

## 2014-11-27 ENCOUNTER — Other Ambulatory Visit: Payer: Self-pay | Admitting: Geriatric Medicine

## 2014-11-27 MED ORDER — CLOPIDOGREL BISULFATE 75 MG PO TABS: 75.0000 mg | ORAL_TABLET | Freq: Every day | ORAL | Status: DC

## 2014-11-29 DIAGNOSIS — Z950 Presence of cardiac pacemaker: Secondary | ICD-10-CM | POA: Diagnosis not present

## 2014-11-29 DIAGNOSIS — H9190 Unspecified hearing loss, unspecified ear: Secondary | ICD-10-CM | POA: Diagnosis not present

## 2014-11-29 DIAGNOSIS — M79621 Pain in right upper arm: Secondary | ICD-10-CM | POA: Diagnosis not present

## 2014-11-29 DIAGNOSIS — I480 Paroxysmal atrial fibrillation: Secondary | ICD-10-CM | POA: Diagnosis not present

## 2014-11-29 DIAGNOSIS — Z8673 Personal history of transient ischemic attack (TIA), and cerebral infarction without residual deficits: Secondary | ICD-10-CM | POA: Diagnosis not present

## 2014-11-30 ENCOUNTER — Ambulatory Visit (INDEPENDENT_AMBULATORY_CARE_PROVIDER_SITE_OTHER): Payer: Medicare Other | Admitting: Cardiovascular Disease

## 2014-11-30 ENCOUNTER — Encounter: Payer: Self-pay | Admitting: Cardiovascular Disease

## 2014-11-30 VITALS — BP 106/64 | HR 80 | Ht 66.0 in | Wt 166.6 lb

## 2014-11-30 DIAGNOSIS — K2270 Barrett's esophagus without dysplasia: Secondary | ICD-10-CM

## 2014-11-30 DIAGNOSIS — G450 Vertebro-basilar artery syndrome: Secondary | ICD-10-CM | POA: Diagnosis not present

## 2014-11-30 DIAGNOSIS — I10 Essential (primary) hypertension: Secondary | ICD-10-CM

## 2014-11-30 DIAGNOSIS — E785 Hyperlipidemia, unspecified: Secondary | ICD-10-CM | POA: Diagnosis not present

## 2014-11-30 DIAGNOSIS — I2510 Atherosclerotic heart disease of native coronary artery without angina pectoris: Secondary | ICD-10-CM

## 2014-11-30 NOTE — Assessment & Plan Note (Signed)
Well controlled.  Continue current medications and low sodium Dash type diet.    

## 2014-11-30 NOTE — Patient Instructions (Signed)
Your physician wants you to follow-up in:  6 MONTHS WITH DR NISHAN  You will receive a reminder letter in the mail two months in advance. If you don't receive a letter, please call our office to schedule the follow-up appointment. Your physician recommends that you continue on your current medications as directed. Please refer to the Current Medication list given to you today. 

## 2014-11-30 NOTE — Assessment & Plan Note (Signed)
Stable with no angina and good activity level.  Continue medical Rx  

## 2014-11-30 NOTE — Assessment & Plan Note (Signed)
Suggested primary arrange a swallowing study to r/o esophageal pathology or aspiration

## 2014-11-30 NOTE — Progress Notes (Signed)
Patient ID: [name], male   DOB: Nov 14, 1916, 81 y.o.   MRN: [id number] [name] is a 81 y.o.  man with symptomatic bradcardia s/p PPM implant, CAD, PAF and HTN who sees Dr Lovena Le and use to see TW . His PPM battery reached ERI in April 2014. And was replaced. He recently fell working in the garden and had a subdural He is very unsteady on his feet He has ankle braces for arthritis He using a walker and cane  He has had multiple falls the last 6 months  Admitted hospital 12/15 with ? TIA  Mild elevation in Troponin CT negative  Placed on Plavix.    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   I saw his wife today as a new patient.  They have been married for 70 year s  Some cough and difficulty swallowing food Not clear that this is aspiration or dysphagia     ROS: Denies fever, malais, weight loss, blurry vision, decreased visual acuity, cough, sputum, SOB, hemoptysis, pleuritic pain, palpitaitons, heartburn, abdominal pain, melena, lower extremity edema, claudication, or rash.  All other systems reviewed and negative  General: Affect appropriate Healthy:  appears stated age 81: hard of hearing  Neck supple with no adenopathy JVP normal no bruits no thyromegaly Lungs clear with no wheezing and good diaphragmatic motion Heart:  S1/S2 no murmur, no rub, gallop or click  Pacer under left clavicle  PMI normal Abdomen: benighn, BS positve, no tenderness, no AAA no bruit.  No HSM or HJR Distal pulses intact with no bruits No edema Neuro non-focal Skin warm and dry No muscular weakness   Current Outpatient Prescriptions  Medication Sig Dispense Refill  . acetaminophen (TYLENOL) 325 MG tablet Take 650 mg by mouth every 6 (six) hours as needed for mild pain.    Marland Kitchen clopidogrel (PLAVIX) 75 MG tablet Take 1 tablet (75 mg total) by mouth daily. 30 tablet 3   . furosemide (LASIX) 40 MG tablet Take 1 tablet (40 mg total) by mouth as needed for edema. 30 tablet 3  . Magnesium 500 MG CAPS Take 1 capsule by mouth daily. Take one by mouth daily    . mirtazapine (REMERON) 7.5 MG tablet Take 1 tablet (7.5 mg total) by mouth at bedtime. 30 tablet 3  . Misc Natural Products (LUTEIN 20 PO) Take 1 tablet by mouth daily.     . multivitamin-lutein (OCUVITE-LUTEIN) CAPS capsule Take 1 capsule by mouth daily. 20 MG dAILY    . nitroGLYCERIN (NITROSTAT) 0.4 MG SL tablet Place 1 tablet (0.4 mg total) under the tongue every 5 (five) minutes as needed. 25 tablet 1  . potassium chloride (K-DUR) 10 MEQ tablet Take 1 tablet (10 mEq total) by mouth daily. 30 tablet 5  . ranitidine (ZANTAC) 150 MG tablet Take 150 mg by mouth at bedtime.    . vitamin C (ASCORBIC ACID) 500 MG tablet Take 1 tablet (500 mg total) by mouth daily.     No current facility-administered medications for this visit.    Allergies  Review of patient's allergies indicates no known allergies.  Electrocardiogram:  SR rate 77 nonspecific ST changes  08/03/14    Assessment and Plan

## 2014-11-30 NOTE — Assessment & Plan Note (Signed)
Cholesterol is at goal.  Continue current dose of statin and diet Rx.  No myalgias or side effects.  F/U  LFT's in 6 months. Lab Results  Component Value Date   LDLCALC 117* 11/05/2014

## 2014-11-30 NOTE — Assessment & Plan Note (Signed)
Etiology not clear CT negative In NSR and not a coumadin candidate Continue Plavix

## 2014-12-11 DIAGNOSIS — Z8673 Personal history of transient ischemic attack (TIA), and cerebral infarction without residual deficits: Secondary | ICD-10-CM | POA: Diagnosis not present

## 2014-12-11 DIAGNOSIS — M79621 Pain in right upper arm: Secondary | ICD-10-CM | POA: Diagnosis not present

## 2014-12-11 DIAGNOSIS — I480 Paroxysmal atrial fibrillation: Secondary | ICD-10-CM | POA: Diagnosis not present

## 2014-12-11 DIAGNOSIS — Z950 Presence of cardiac pacemaker: Secondary | ICD-10-CM | POA: Diagnosis not present

## 2014-12-11 DIAGNOSIS — H9190 Unspecified hearing loss, unspecified ear: Secondary | ICD-10-CM | POA: Diagnosis not present

## 2014-12-12 DIAGNOSIS — Z0000 Encounter for general adult medical examination without abnormal findings: Secondary | ICD-10-CM | POA: Diagnosis not present

## 2014-12-12 DIAGNOSIS — C61 Malignant neoplasm of prostate: Secondary | ICD-10-CM | POA: Diagnosis not present

## 2014-12-26 ENCOUNTER — Other Ambulatory Visit: Payer: Self-pay

## 2014-12-26 MED ORDER — PRAVASTATIN SODIUM 10 MG PO TABS: 10.0000 mg | ORAL_TABLET | Freq: Every evening | ORAL | Status: DC

## 2014-12-28 DIAGNOSIS — Z8673 Personal history of transient ischemic attack (TIA), and cerebral infarction without residual deficits: Secondary | ICD-10-CM | POA: Diagnosis not present

## 2014-12-28 DIAGNOSIS — H9190 Unspecified hearing loss, unspecified ear: Secondary | ICD-10-CM | POA: Diagnosis not present

## 2014-12-28 DIAGNOSIS — Z950 Presence of cardiac pacemaker: Secondary | ICD-10-CM | POA: Diagnosis not present

## 2014-12-28 DIAGNOSIS — I480 Paroxysmal atrial fibrillation: Secondary | ICD-10-CM | POA: Diagnosis not present

## 2014-12-28 DIAGNOSIS — M79621 Pain in right upper arm: Secondary | ICD-10-CM | POA: Diagnosis not present

## 2015-01-04 ENCOUNTER — Emergency Department (HOSPITAL_COMMUNITY)
Admission: EM | Admit: 2015-01-04 | Discharge: 2015-01-04 | Disposition: A | Discharge status: Home / Self Care | Payer: Medicare Other | Attending: Emergency Medicine | Admitting: Emergency Medicine

## 2015-01-04 ENCOUNTER — Emergency Department (HOSPITAL_COMMUNITY): Payer: Medicare Other

## 2015-01-04 ENCOUNTER — Encounter (HOSPITAL_COMMUNITY): Payer: Self-pay | Admitting: Emergency Medicine

## 2015-01-04 DIAGNOSIS — R079 Chest pain, unspecified: Secondary | ICD-10-CM | POA: Diagnosis not present

## 2015-01-04 DIAGNOSIS — E785 Hyperlipidemia, unspecified: Secondary | ICD-10-CM | POA: Diagnosis not present

## 2015-01-04 DIAGNOSIS — Z79899 Other long term (current) drug therapy: Secondary | ICD-10-CM | POA: Diagnosis not present

## 2015-01-04 DIAGNOSIS — Z7902 Long term (current) use of antithrombotics/antiplatelets: Secondary | ICD-10-CM | POA: Diagnosis not present

## 2015-01-04 DIAGNOSIS — I2510 Atherosclerotic heart disease of native coronary artery without angina pectoris: Secondary | ICD-10-CM | POA: Insufficient documentation

## 2015-01-04 DIAGNOSIS — Z87891 Personal history of nicotine dependence: Secondary | ICD-10-CM | POA: Insufficient documentation

## 2015-01-04 DIAGNOSIS — I129 Hypertensive chronic kidney disease with stage 1 through stage 4 chronic kidney disease, or unspecified chronic kidney disease: Secondary | ICD-10-CM | POA: Diagnosis not present

## 2015-01-04 DIAGNOSIS — Z8546 Personal history of malignant neoplasm of prostate: Secondary | ICD-10-CM | POA: Diagnosis not present

## 2015-01-04 DIAGNOSIS — Z950 Presence of cardiac pacemaker: Secondary | ICD-10-CM | POA: Insufficient documentation

## 2015-01-04 DIAGNOSIS — N189 Chronic kidney disease, unspecified: Secondary | ICD-10-CM | POA: Insufficient documentation

## 2015-01-04 DIAGNOSIS — M069 Rheumatoid arthritis, unspecified: Secondary | ICD-10-CM | POA: Diagnosis not present

## 2015-01-04 DIAGNOSIS — Z951 Presence of aortocoronary bypass graft: Secondary | ICD-10-CM | POA: Insufficient documentation

## 2015-01-04 DIAGNOSIS — R0789 Other chest pain: Secondary | ICD-10-CM | POA: Insufficient documentation

## 2015-01-04 DIAGNOSIS — I480 Paroxysmal atrial fibrillation: Secondary | ICD-10-CM | POA: Insufficient documentation

## 2015-01-04 DIAGNOSIS — K219 Gastro-esophageal reflux disease without esophagitis: Secondary | ICD-10-CM | POA: Diagnosis not present

## 2015-01-04 DIAGNOSIS — I509 Heart failure, unspecified: Secondary | ICD-10-CM | POA: Diagnosis not present

## 2015-01-04 DIAGNOSIS — J9811 Atelectasis: Secondary | ICD-10-CM | POA: Diagnosis not present

## 2015-01-04 LAB — BASIC METABOLIC PANEL
Anion gap: 10 (ref 5–15)
BUN: 23 mg/dL (ref 6–23)
CALCIUM: 9.3 mg/dL (ref 8.4–10.5)
CO2: 24 mmol/L (ref 19–32)
CREATININE: 1.22 mg/dL (ref 0.50–1.35)
Chloride: 104 mmol/L (ref 96–112)
GFR calc Af Amer: 56 mL/min — ABNORMAL LOW (ref >90)
GFR calc non Af Amer: 49 mL/min — ABNORMAL LOW (ref >90)
Glucose, Bld: 101 mg/dL — ABNORMAL HIGH (ref 70–99)
Potassium: 3.9 mmol/L (ref 3.5–5.1)
Sodium: 138 mmol/L (ref 135–145)

## 2015-01-04 LAB — CBC
HCT: 40.7 % (ref 39.0–52.0)
HEMOGLOBIN: 13.2 g/dL (ref 13.0–17.0)
MCH: 29.7 pg (ref 26.0–34.0)
MCHC: 32.4 g/dL (ref 30.0–36.0)
MCV: 91.5 fL (ref 78.0–100.0)
Platelets: 197 10*3/uL (ref 150–400)
RBC: 4.45 MIL/uL (ref 4.22–5.81)
RDW: 15.1 % (ref 11.5–15.5)
WBC: 8.3 10*3/uL (ref 4.0–10.5)

## 2015-01-04 LAB — I-STAT TROPONIN, ED: TROPONIN I, POC: 0.01 ng/mL (ref 0.00–0.08)

## 2015-01-04 MED ORDER — TRAMADOL HCL 50 MG PO TABS
50.0000 mg | ORAL_TABLET | Freq: Once | ORAL | Status: AC
  Administered 2015-01-04: 50 mg via ORAL
  Filled 2015-01-04: qty 1

## 2015-01-04 MED ORDER — TRAMADOL HCL 50 MG PO TABS
50.0000 mg | ORAL_TABLET | Freq: Four times a day (QID) | ORAL | Status: DC | PRN
Start: 2015-01-04 — End: 2015-01-30

## 2015-01-04 NOTE — ED Provider Notes (Signed)
CSN: [id number]     Arrival date & time 01/04/15  0021 History   This chart was scribed for Veryl Speak, MD by Einar Pheasant, ED Scribe. This patient was seen in room A01C/A01C and the patient's care was started at 12:57 AM.   Chief Complaint  Patient presents with  . Chest Pain   The history is provided by the patient and medical records. No language interpreter was used.   HPI Comments: ARIS EVEN is a 81 y.o. male with PMhx of CAD, CHF, afib, and HTN was brought in by ambulance and presents to the Emergency Department complaining of left chest pain that started several days ago but worsened 45 minutes before arrival. He states that the pain is right where his pacemaker is and the pain is worsened by deep breathing. One was placed 10 years ago and another one after the old one died. Mr. Caraveo has a hx of quadruple bipass which was done approximately 20 years ago. Daughter reports giving the pt 1 nitro, which the pt states it somewhat helped with his discomfort. Pt denies fever, neck pain, sore throat, visual disturbance, CP, cough, SOB, abdominal pain, nausea, emesis, diarrhea, urinary symptoms, back pain, HA, weakness, numbness and rash as associated symptoms.    Past Medical History  Diagnosis Date  . Paroxysmal atrial fibrillation   . CAD (coronary artery disease)     CABG 08/1973  . CHF (congestive heart failure)     ICM  . Pacemaker 09/2003    RESULTS:  This demonstrates successful implantation of a Medtronic dual  . Hypertension   . Hyperlipidemia   . Unsteady gait     due to B ankle DJD and instability  . Memory loss   . Chronic renal insufficiency   . Barrett's esophagus   . Diverticulosis of colon   . GERD (gastroesophageal reflux disease)   . Adenocarcinoma of prostate 10/2005    lupron injections q8mo  . Atrial fibrillation     chronic anticoag  . Arthritis     ?rheumatoid, never dx or on DMARDs  . Osteoporosis    Past Surgical History  Procedure Laterality  Date  . Pacemaker placement  09/2003  . Hernia repair      age 37 ( not sure if it was right or left side)  . Coronary artery bypass graft  08/1973    3 heart heart arteries were 80 percent clogged. the lower valve of my heart is not functioning  . Doppler echocardiography  2004, 2008  . Pacemaker generator change N/A 05/02/2013    Procedure: PACEMAKER GENERATOR CHANGE;  Surgeon: Evans Lance, MD;  Location: [facility] CATH LAB;  Service: Cardiovascular;  Laterality: N/A;   Family History  Problem Relation Age of Onset  . Cancer Mother   . Heart attack Father    History  Substance Use Topics  . Smoking status: Former Smoker    Quit date: 08/05/1986  . Smokeless tobacco: Not on file  . Alcohol Use: Yes     Comment: samll glass of wine    Review of Systems  Constitutional: Negative for fever and chills.  HENT: Negative for rhinorrhea and sore throat.   Eyes: Negative for visual disturbance.  Respiratory: Negative for cough and shortness of breath.   Cardiovascular: Positive for chest pain. Negative for leg swelling.  Gastrointestinal: Negative for nausea, vomiting, abdominal pain and diarrhea.  Genitourinary: Negative for dysuria.  Musculoskeletal: Negative for back pain and neck pain.  Skin: Negative  for rash.  Neurological: Negative for dizziness, light-headedness and headaches.  Hematological: Does not bruise/bleed easily.  Psychiatric/Behavioral: Negative for confusion.   Allergies  Review of patient's allergies indicates no known allergies.  Home Medications   Prior to Admission medications   Medication Sig Start Date End Date Taking? Authorizing Provider  acetaminophen (TYLENOL) 325 MG tablet Take 650 mg by mouth every 6 (six) hours as needed for mild pain.    Historical Provider, MD  clopidogrel (PLAVIX) 75 MG tablet Take 1 tablet (75 mg total) by mouth daily. 11/27/14   Olga Millers, MD  furosemide (LASIX) 40 MG tablet Take 1 tablet (40 mg total) by mouth as needed  for edema. 11/24/14   Olga Millers, MD  Magnesium 500 MG CAPS Take 1 capsule by mouth daily. Take one by mouth daily    Historical Provider, MD  mirtazapine (REMERON) 7.5 MG tablet Take 1 tablet (7.5 mg total) by mouth at bedtime. 10/24/14   Olga Millers, MD  Misc Natural Products (LUTEIN 20 PO) Take 1 tablet by mouth daily.     Historical Provider, MD  multivitamin-lutein (OCUVITE-LUTEIN) CAPS capsule Take 1 capsule by mouth daily. 20 MG dAILY    Historical Provider, MD  nitroGLYCERIN (NITROSTAT) 0.4 MG SL tablet Place 1 tablet (0.4 mg total) under the tongue every 5 (five) minutes as needed. 08/03/14   Josue Hector, MD  potassium chloride (K-DUR) 10 MEQ tablet Take 1 tablet (10 mEq total) by mouth daily. 11/13/14   Rowe Clack, MD  pravastatin (PRAVACHOL) 10 MG tablet Take 1 tablet (10 mg total) by mouth every evening. 12/26/14   Rowe Clack, MD  ranitidine (ZANTAC) 150 MG tablet Take 150 mg by mouth at bedtime.    Historical Provider, MD  vitamin C (ASCORBIC ACID) 500 MG tablet Take 1 tablet (500 mg total) by mouth daily. 07/06/13   Rowe Clack, MD   BP 126/75 mmHg  Pulse 73  Temp(Src) 97.7 F (36.5 C) (Oral)  Resp 18  SpO2 97%   Physical Exam  Constitutional: He appears well-developed and well-nourished. No distress.  HENT:  Head: Normocephalic and atraumatic.  Eyes: Conjunctivae are normal. Right eye exhibits no discharge. Left eye exhibits no discharge.  Neck: Neck supple.  Cardiovascular: Normal rate, regular rhythm and normal heart sounds.  Exam reveals no gallop and no friction rub.   No murmur heard. Pulmonary/Chest: Effort normal and breath sounds normal. No respiratory distress. He exhibits tenderness.  There is tenderness to palpation over his pacemaker site. There is no redness or warmth.   Abdominal: Soft. He exhibits no distension. There is no tenderness.  Musculoskeletal: He exhibits no edema or tenderness.  Neurological: He is alert.   Skin: Skin is warm and dry.  Psychiatric: He has a normal mood and affect. His behavior is normal. Thought content normal.  Nursing note and vitals reviewed.   ED Course  Procedures (including critical care time)  DIAGNOSTIC STUDIES: Oxygen Saturation is 97% on RA, adequate by my interpretation.    COORDINATION OF CARE: 1:04 AM- Daughter declines any pain medication at this time. Pt advised of plan for treatment and pt agrees.  Labs Review Labs Reviewed - No data to display  Imaging Review No results found.   EKG Interpretation   Date/Time:  Thursday January 04 2015 00:31:45 EST Ventricular Rate:  73 PR Interval:  216 QRS Duration: 122 QT Interval:  413 QTC Calculation: 455 R Axis:   -4 Text  Interpretation:  Sinus rhythm Atrial premature complex Borderline  prolonged PR interval Nonspecific intraventricular conduction delay  Inferior infarct, age indeterminate Lateral leads are also involved  Baseline wander in lead(s) I II aVR aVF Confirmed by DELOS  MD, Benen Weida  [phone]) on 01/04/2015 3:00:45 AM      MDM   Final diagnoses:  None    Patient is a 81 year old male with history of coronary artery disease, CHF, pacemaker placement. He presents today with complaints of pain in his left upper chest that started 3 days ago. This began in the absence of any injury or trauma. His pain is worse with breathing, palpation, and movement.  There is no hypoxia or tachycardia which would suggest a pulmonary embolism. He is exquisitely tender over his pacemaker site and I suspect the discomfort is in his chest wall. His EKG is unchanged and troponin is negative. I believe is that he is appropriate for discharge. I will prescribe tramadol and have him follow-up with his primary Dr.  I personally performed the services described in this documentation, which was scribed in my presence. The recorded information has been reviewed and is accurate.      Veryl Speak, MD 01/04/15  [phone]

## 2015-01-04 NOTE — Discharge Instructions (Signed)
Tramadol as prescribed as needed for pain.  Follow-up with your cardiologist and primary Dr. in the next few days, and return to the ER if you develop any worsening or new symptoms.   Chest Wall Pain Chest wall pain is pain in or around the bones and muscles of your chest. It may take up to 6 weeks to get better. It may take longer if you must stay physically active in your work and activities.  CAUSES  Chest wall pain may happen on its own. However, it may be caused by:  A viral illness like the flu.  Injury.  Coughing.  Exercise.  Arthritis.  Fibromyalgia.  Shingles. HOME CARE INSTRUCTIONS   Avoid overtiring physical activity. Try not to strain or perform activities that cause pain. This includes any activities using your chest or your abdominal and side muscles, especially if heavy weights are used.  Put ice on the sore area.  Put ice in a plastic bag.  Place a towel between your skin and the bag.  Leave the ice on for 15-20 minutes per hour while awake for the first 2 days.  Only take over-the-counter or prescription medicines for pain, discomfort, or fever as directed by your caregiver. SEEK IMMEDIATE MEDICAL CARE IF:   Your pain increases, or you are very uncomfortable.  You have a fever.  Your chest pain becomes worse.  You have new, unexplained symptoms.  You have nausea or vomiting.  You feel sweaty or lightheaded.  You have a cough with phlegm (sputum), or you cough up blood. MAKE SURE YOU:   Understand these instructions.  Will watch your condition.  Will get help right away if you are not doing well or get worse. Document Released: 10/27/2005 Document Revised: 01/19/2012 Document Reviewed: 06/23/2011 [facility] Patient Information 2015 [place], [place]. This information is not intended to replace advice given to you by your health care provider. Make sure you discuss any questions you have with your health care provider.

## 2015-01-04 NOTE — ED Notes (Signed)
Pt arrives from home with c.o chest pain onset two hours ago, states worse with deep breathing and palpations. 1 SL nitro p/t EMS arrival, ineffective. Pain 7/10. Has not taken nighttime zantac tonight. Pacemaker patient.

## 2015-01-04 NOTE — ED Notes (Signed)
MD at bedside. 

## 2015-01-08 ENCOUNTER — Ambulatory Visit (INDEPENDENT_AMBULATORY_CARE_PROVIDER_SITE_OTHER): Payer: Medicare Other | Admitting: Internal Medicine

## 2015-01-08 ENCOUNTER — Encounter: Payer: Self-pay | Admitting: Internal Medicine

## 2015-01-08 VITALS — BP 100/58 | HR 84 | Temp 97.4°F | Resp 16 | Wt 165.0 lb

## 2015-01-08 DIAGNOSIS — I2510 Atherosclerotic heart disease of native coronary artery without angina pectoris: Secondary | ICD-10-CM | POA: Diagnosis not present

## 2015-01-08 DIAGNOSIS — I5042 Chronic combined systolic (congestive) and diastolic (congestive) heart failure: Secondary | ICD-10-CM | POA: Diagnosis not present

## 2015-01-08 DIAGNOSIS — K2270 Barrett's esophagus without dysplasia: Secondary | ICD-10-CM | POA: Diagnosis not present

## 2015-01-08 DIAGNOSIS — R1310 Dysphagia, unspecified: Secondary | ICD-10-CM

## 2015-01-08 NOTE — Patient Instructions (Signed)
We will have the speech expert evaluate you for any problems with swallowing. They may be able to help with tips and tricks to help decrease the coughing with eating.   Call us back if you have any problems or questions.

## 2015-01-08 NOTE — Assessment & Plan Note (Signed)
Most recent chest pains not cardiac in nature. Pains have since resolved and no EKG changes, troponin elevation, CXR normal. Have asked him to continue to do his exercises daily and keep mobile.

## 2015-01-08 NOTE — Progress Notes (Signed)
   Subjective:    Patient ID: [name], male    DOB: July 27, 1917, 81 y.o.   MRN: [id number]  HPI The patient is a 81 YO man who is coming in to follow up on an ER visit (records reviewed, seen and evaluated for chest pain, negative heart and lung work up, deemed muscular). He is doing much better and has minimal pain now at all. No SOB with walking. No tightness in his chest. Has not needed nitroglycerin. His daughter is with him and helps to provide history. They also have an additional concern that they would like to discuss which is that he is coughing after eating all the time now. No problems with food getting stuck. He has not had this problem with liquids but with solids. Going on for many months and stable.   Review of Systems  Constitutional: Negative for fever and chills.  HENT: Negative.   Eyes: Negative.   Respiratory: Positive for cough. Negative for chest tightness and shortness of breath.   Cardiovascular: Negative for chest pain, palpitations and leg swelling.  Genitourinary: Negative.   Musculoskeletal: Positive for gait problem. Negative for back pain and arthralgias.  Skin: Negative.   Neurological: Positive for weakness. Negative for dizziness, syncope, light-headedness, numbness and headaches.      Objective:   Physical Exam  Constitutional: He appears well-developed and well-nourished.  HENT:  Head: Normocephalic and atraumatic.  Eyes: EOM are normal.  Neck: Normal range of motion.  Cardiovascular: Normal rate.   Pulmonary/Chest: Effort normal and breath sounds normal. No respiratory distress. He has no wheezes. He has no rales.  Abdominal: Soft. Bowel sounds are normal.  Neurological: He is alert. Coordination abnormal.  Slow gait uses walker.  Skin: Skin is warm and dry.   Filed Vitals:   01/08/15 1023  BP: 100/58  Pulse: 84  Temp: 97.4 F (36.3 C)  TempSrc: Oral  Resp: 16  Weight: 165 lb (74.844 kg)  SpO2: 96%      Assessment & Plan:

## 2015-01-08 NOTE — Progress Notes (Deleted)
.  lbp 

## 2015-01-08 NOTE — Assessment & Plan Note (Signed)
Unclear but do not suspect tied to his coughing with good. Will have SLP evaluate his swallowing. Talked with them about chin tuck maneuver to see if this helps. May require swallowing study to evaluate further.

## 2015-01-08 NOTE — Progress Notes (Signed)
Pre visit review using our clinic review tool, if applicable. No additional management support is needed unless otherwise documented below in the visit note. 

## 2015-01-13 ENCOUNTER — Other Ambulatory Visit: Payer: Self-pay | Admitting: Internal Medicine

## 2015-01-19 ENCOUNTER — Other Ambulatory Visit: Payer: Self-pay | Admitting: Internal Medicine

## 2015-01-19 DIAGNOSIS — R1310 Dysphagia, unspecified: Secondary | ICD-10-CM

## 2015-01-19 DIAGNOSIS — R1314 Dysphagia, pharyngoesophageal phase: Secondary | ICD-10-CM

## 2015-01-24 DIAGNOSIS — I2510 Atherosclerotic heart disease of native coronary artery without angina pectoris: Secondary | ICD-10-CM | POA: Diagnosis not present

## 2015-01-24 DIAGNOSIS — Z8546 Personal history of malignant neoplasm of prostate: Secondary | ICD-10-CM | POA: Diagnosis not present

## 2015-01-24 DIAGNOSIS — M1990 Unspecified osteoarthritis, unspecified site: Secondary | ICD-10-CM | POA: Diagnosis not present

## 2015-01-24 DIAGNOSIS — I10 Essential (primary) hypertension: Secondary | ICD-10-CM | POA: Diagnosis not present

## 2015-01-24 DIAGNOSIS — I5042 Chronic combined systolic (congestive) and diastolic (congestive) heart failure: Secondary | ICD-10-CM | POA: Diagnosis not present

## 2015-01-24 DIAGNOSIS — Z8673 Personal history of transient ischemic attack (TIA), and cerebral infarction without residual deficits: Secondary | ICD-10-CM | POA: Diagnosis not present

## 2015-01-24 DIAGNOSIS — R1310 Dysphagia, unspecified: Secondary | ICD-10-CM | POA: Diagnosis not present

## 2015-01-24 DIAGNOSIS — K2270 Barrett's esophagus without dysplasia: Secondary | ICD-10-CM | POA: Diagnosis not present

## 2015-01-25 ENCOUNTER — Ambulatory Visit (INDEPENDENT_AMBULATORY_CARE_PROVIDER_SITE_OTHER): Payer: Medicare Other | Admitting: Podiatrist

## 2015-01-25 ENCOUNTER — Encounter: Payer: Self-pay | Admitting: Podiatrist

## 2015-01-25 DIAGNOSIS — B351 Tinea unguium: Secondary | ICD-10-CM | POA: Diagnosis not present

## 2015-01-25 DIAGNOSIS — M79676 Pain in unspecified toe(s): Secondary | ICD-10-CM | POA: Diagnosis not present

## 2015-01-25 NOTE — Progress Notes (Signed)
HPI: Patient presents today for follow up of foot and nail care. Denies any new complaints today.  Objective: Patients chart is reviewed. Vascular status reveals pedal pulses noted at 2 out of 4 dp and pt bilateral . Neurological sensation is Normal to Semmes Weinstein monofilament bilateral. Patients nails are thickened, discolored, distrophic, friable and brittle with yellow-brown discoloration. Patient subjectively relates they are painful with shoes and with ambulation of bilateral feet.  Assessment: Symptomatic onychomycosis  Plan: Discussed treatment options and alternatives. The symptomatic toenails were debrided through manual an mechanical means without complication. Return appointment recommended at routine intervals of 3 months   

## 2015-01-28 NOTE — Progress Notes (Signed)
Patient ID: [name], male   DOB: [birth year], 81 y.o.   MRN: [id number] [name] is a 81 y.o.  man with symptomatic bradcardia s/p PPM implant, CAD, PAF and HTN who sees Dr Lovena Le and use to see TW . His PPM battery reached ERI in April 2014. And was replaced. He recently fell working in the garden and had a subdural He is very unsteady on his feet He has ankle braces for arthritis He using a walker and cane  He has had multiple falls the last 6 months  Admitted hospital 12/15 with ? TIA  Mild elevation in Troponin CT negative  Placed on Plavix.    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 Has multiple illustrated books/ stories regarding his time in [employer] school.   I saw his wife today as a new patient.  They have been married for 70 year s  Some cough and difficulty swallowing food Not clear that this is aspiration or dysphagia   Ankles hurt and lead to falls  Has PT exercises and braces   ROS: Denies fever, malais, weight loss, blurry vision, decreased visual acuity, cough, sputum, SOB, hemoptysis, pleuritic pain, palpitaitons, heartburn, abdominal pain, melena, lower extremity edema, claudication, or rash.  All other systems reviewed and negative  General: Affect appropriate Healthy:  appears stated age 60: hard of hearing  Neck supple with no adenopathy JVP normal no bruits no thyromegaly Lungs clear with no wheezing and good diaphragmatic motion Heart:  S1/S2 AS murmur, no rub, gallop or click  Pacer under left clavicle  PMI normal Abdomen: benighn, BS positve, no tenderness, no AAA no bruit.  No HSM or HJR Distal pulses intact with no bruits Plus one bilateral  Edema with ankle braces  Neuro non-focal Skin warm and dry No muscular weakness   Current Outpatient Prescriptions  Medication Sig Dispense Refill  . acetaminophen (TYLENOL) 325 MG tablet Take 650 mg by mouth every 6 (six) hours as needed for mild pain.     Marland Kitchen clopidogrel (PLAVIX) 75 MG tablet Take 1 tablet (75 mg total) by mouth daily. 30 tablet 3  . furosemide (LASIX) 40 MG tablet Take 1 tablet (40 mg total) by mouth as needed for edema. 30 tablet 3  . Magnesium 500 MG CAPS Take 1 capsule by mouth daily. Take one by mouth daily    . mirtazapine (REMERON) 7.5 MG tablet Take 1 tablet (7.5 mg total) by mouth at bedtime. 30 tablet 3  . Misc Natural Products (LUTEIN 20 PO) Take 1 tablet by mouth daily.     . multivitamin-lutein (OCUVITE-LUTEIN) CAPS capsule Take 1 capsule by mouth daily. 20 MG dAILY    . nitroGLYCERIN (NITROSTAT) 0.4 MG SL tablet Place 1 tablet (0.4 mg total) under the tongue every 5 (five) minutes as needed. 25 tablet 1  . potassium chloride (K-DUR) 10 MEQ tablet Take 1 tablet (10 mEq total) by mouth daily. 30 tablet 5  . pravastatin (PRAVACHOL) 10 MG tablet Take 1 tablet (10 mg total) by mouth every evening. 90 tablet 0  . ranitidine (ZANTAC) 150 MG tablet Take 150 mg by mouth at bedtime.    . ranitidine (ZANTAC) 150 MG tablet Take 1 tablet (150 mg total) by mouth at bedtime. 30 tablet 11  . traMADol (ULTRAM) 50 MG tablet Take 1 tablet (50 mg total) by mouth every 6 (six) hours as needed. 20 tablet 0  . vitamin  C (ASCORBIC ACID) 500 MG tablet Take 1 tablet (500 mg total) by mouth daily.     No current facility-administered medications for this visit.    Allergies  Review of patient's allergies indicates no known allergies.  Electrocardiogram:  SR rate 77 nonspecific ST changes  08/03/14    Assessment and Plan

## 2015-01-30 ENCOUNTER — Encounter: Payer: Self-pay | Admitting: Cardiovascular Disease

## 2015-01-30 ENCOUNTER — Ambulatory Visit (INDEPENDENT_AMBULATORY_CARE_PROVIDER_SITE_OTHER): Payer: Medicare Other | Admitting: Cardiovascular Disease

## 2015-01-30 VITALS — BP 109/66 | HR 88 | Ht 66.0 in | Wt 170.0 lb

## 2015-01-30 DIAGNOSIS — I350 Nonrheumatic aortic (valve) stenosis: Secondary | ICD-10-CM | POA: Insufficient documentation

## 2015-01-30 DIAGNOSIS — I10 Essential (primary) hypertension: Secondary | ICD-10-CM | POA: Diagnosis not present

## 2015-01-30 DIAGNOSIS — I6300 Cerebral infarction due to thrombosis of unspecified precerebral artery: Secondary | ICD-10-CM

## 2015-01-30 DIAGNOSIS — I2510 Atherosclerotic heart disease of native coronary artery without angina pectoris: Secondary | ICD-10-CM | POA: Diagnosis not present

## 2015-01-30 DIAGNOSIS — Z950 Presence of cardiac pacemaker: Secondary | ICD-10-CM | POA: Diagnosis not present

## 2015-01-30 NOTE — Assessment & Plan Note (Signed)
Reviewed last PaceArt normal function Battery replaced 2014  Daughter helps with transtelephonic transmissions

## 2015-01-30 NOTE — Assessment & Plan Note (Signed)
Mild by most recenct echo reviewed No change in murmur given age observe

## 2015-01-30 NOTE — Assessment & Plan Note (Signed)
Stable with no angina and good activity level.  Continue medical Rx  

## 2015-01-30 NOTE — Patient Instructions (Signed)
Your physician wants you to follow-up in:  6 MONTHS WITH DR NISHAN  You will receive a reminder letter in the mail two months in advance. If you don't receive a letter, please call our office to schedule the follow-up appointment. Your physician recommends that you continue on your current medications as directed. Please refer to the Current Medication list given to you today. 

## 2015-01-30 NOTE — Assessment & Plan Note (Signed)
Well controlled.  Continue current medications and low sodium Dash type diet.    

## 2015-01-30 NOTE — Assessment & Plan Note (Signed)
No carotid disease on asa and plavix not an anticoagulation candidate due to falls and age

## 2015-01-31 DIAGNOSIS — I2510 Atherosclerotic heart disease of native coronary artery without angina pectoris: Secondary | ICD-10-CM | POA: Diagnosis not present

## 2015-01-31 DIAGNOSIS — K2270 Barrett's esophagus without dysplasia: Secondary | ICD-10-CM | POA: Diagnosis not present

## 2015-01-31 DIAGNOSIS — M1990 Unspecified osteoarthritis, unspecified site: Secondary | ICD-10-CM | POA: Diagnosis not present

## 2015-01-31 DIAGNOSIS — I10 Essential (primary) hypertension: Secondary | ICD-10-CM | POA: Diagnosis not present

## 2015-01-31 DIAGNOSIS — R1310 Dysphagia, unspecified: Secondary | ICD-10-CM | POA: Diagnosis not present

## 2015-01-31 DIAGNOSIS — I5042 Chronic combined systolic (congestive) and diastolic (congestive) heart failure: Secondary | ICD-10-CM | POA: Diagnosis not present

## 2015-02-02 ENCOUNTER — Other Ambulatory Visit: Payer: Self-pay | Admitting: Internal Medicine

## 2015-02-05 ENCOUNTER — Other Ambulatory Visit: Payer: Self-pay

## 2015-02-07 DIAGNOSIS — R1310 Dysphagia, unspecified: Secondary | ICD-10-CM | POA: Diagnosis not present

## 2015-02-12 DIAGNOSIS — H6123 Impacted cerumen, bilateral: Secondary | ICD-10-CM | POA: Diagnosis not present

## 2015-02-15 ENCOUNTER — Ambulatory Visit (INDEPENDENT_AMBULATORY_CARE_PROVIDER_SITE_OTHER): Payer: Medicare Other | Admitting: *Deleted

## 2015-02-15 ENCOUNTER — Encounter: Payer: Self-pay | Admitting: Internal Medicine

## 2015-02-15 ENCOUNTER — Telehealth: Payer: Self-pay | Admitting: Cardiology

## 2015-02-15 DIAGNOSIS — R001 Bradycardia, unspecified: Secondary | ICD-10-CM | POA: Diagnosis not present

## 2015-02-15 DIAGNOSIS — M1990 Unspecified osteoarthritis, unspecified site: Secondary | ICD-10-CM | POA: Diagnosis not present

## 2015-02-15 DIAGNOSIS — I2510 Atherosclerotic heart disease of native coronary artery without angina pectoris: Secondary | ICD-10-CM | POA: Diagnosis not present

## 2015-02-15 DIAGNOSIS — R1310 Dysphagia, unspecified: Secondary | ICD-10-CM | POA: Diagnosis not present

## 2015-02-15 DIAGNOSIS — K2270 Barrett's esophagus without dysplasia: Secondary | ICD-10-CM | POA: Diagnosis not present

## 2015-02-15 DIAGNOSIS — I10 Essential (primary) hypertension: Secondary | ICD-10-CM | POA: Diagnosis not present

## 2015-02-15 DIAGNOSIS — I5042 Chronic combined systolic (congestive) and diastolic (congestive) heart failure: Secondary | ICD-10-CM | POA: Diagnosis not present

## 2015-02-15 LAB — MDC_IDC_ENUM_SESS_TYPE_REMOTE
Battery Impedance: 172 Ohm
Battery Remaining Longevity: 132 mo
Battery Voltage: 2.79 V
Brady Statistic AP VS Percent: 9 %
Brady Statistic AS VP Percent: 3 %
Date Time Interrogation Session: 20160407184040
Lead Channel Pacing Threshold Amplitude: 0.875 V
Lead Channel Pacing Threshold Amplitude: 1.5 V
Lead Channel Pacing Threshold Pulse Width: 0.4 ms
Lead Channel Sensing Intrinsic Amplitude: 1.4 mV
Lead Channel Sensing Intrinsic Amplitude: 5.6 mV
Lead Channel Setting Pacing Amplitude: 3 V
Lead Channel Setting Pacing Pulse Width: 0.4 ms
Lead Channel Setting Sensing Sensitivity: 4 mV
MDC IDC MSMT LEADCHNL RA IMPEDANCE VALUE: 459 Ohm
MDC IDC MSMT LEADCHNL RA PACING THRESHOLD PULSEWIDTH: 0.4 ms
MDC IDC MSMT LEADCHNL RV IMPEDANCE VALUE: 592 Ohm
MDC IDC SET LEADCHNL RA PACING AMPLITUDE: 2 V
MDC IDC STAT BRADY AP VP PERCENT: 7 %
MDC IDC STAT BRADY AS VS PERCENT: 82 %

## 2015-02-15 NOTE — Telephone Encounter (Signed)
Confirmed remote transmission w/ pt daughter.   

## 2015-02-15 NOTE — Progress Notes (Signed)
Remote pacemaker transmission.   

## 2015-02-21 ENCOUNTER — Other Ambulatory Visit: Payer: Self-pay | Admitting: *Deleted

## 2015-02-21 MED ORDER — NITROGLYCERIN 0.4 MG SL SUBL: 0.4000 mg | SUBLINGUAL_TABLET | SUBLINGUAL | Status: AC | PRN

## 2015-03-05 ENCOUNTER — Encounter: Payer: Self-pay | Admitting: Cardiology

## 2015-03-05 ENCOUNTER — Ambulatory Visit: Payer: Medicare Other | Admitting: Internal Medicine

## 2015-03-06 ENCOUNTER — Encounter: Payer: Self-pay | Admitting: Internal Medicine

## 2015-03-06 ENCOUNTER — Ambulatory Visit (INDEPENDENT_AMBULATORY_CARE_PROVIDER_SITE_OTHER): Payer: Medicare Other | Admitting: Internal Medicine

## 2015-03-06 VITALS — BP 98/58 | HR 84 | Temp 97.9°F | Resp 18 | Ht 66.0 in | Wt 171.0 lb

## 2015-03-06 DIAGNOSIS — M25511 Pain in right shoulder: Secondary | ICD-10-CM

## 2015-03-06 DIAGNOSIS — I6300 Cerebral infarction due to thrombosis of unspecified precerebral artery: Secondary | ICD-10-CM | POA: Diagnosis not present

## 2015-03-06 DIAGNOSIS — R1310 Dysphagia, unspecified: Secondary | ICD-10-CM | POA: Diagnosis not present

## 2015-03-06 MED ORDER — DICLOFENAC SODIUM 1 % TD GEL: 2.0000 g | Freq: Three times a day (TID) | TRANSDERMAL | Status: DC | PRN

## 2015-03-06 NOTE — Progress Notes (Signed)
Pre visit review using our clinic review tool, if applicable. No additional management support is needed unless otherwise documented below in the visit note. 

## 2015-03-06 NOTE — Patient Instructions (Signed)
We have sent in the voltaren gel for your shoulder, you can use it up to 3 times per day for the pain. You can also use ice or heat for the pain. Use for 15 minutes on maximum at a time.   Keep in mind the strategies to help decrease coughing with eating. Keep working on exercising to keep your strength.   Come back in about 6 months, we will work on your form and if we have any more questions we will call you back for that information.

## 2015-03-08 DIAGNOSIS — R1310 Dysphagia, unspecified: Secondary | ICD-10-CM | POA: Insufficient documentation

## 2015-03-08 DIAGNOSIS — M25519 Pain in unspecified shoulder: Secondary | ICD-10-CM | POA: Insufficient documentation

## 2015-03-08 NOTE — Progress Notes (Signed)
   Subjective:    Patient ID: [name], male    DOB: [birth year], 81 y.o.   MRN: [id number]  HPI The patient is a 81 YO man who is coming in to follow up on his cough with eating as well he is having a new problem of right arm pain. He did see the SLP at home who gave them some good strategies for preventing aspiration. He does not always remember then and sometimes gets irritable when his daughter reminds him (she is here to help provide history as well as his wife). He is doing better about sitting upright and always eating at the table is helping with this instead of in his chair.  His right arm pain has been going on for about 2 weeks and feels like a pulled muscle. He thinks that he may have tried to do too much and then it started hurting. He has not tried anything for the pain which is 3-4/10 at its worst. Off an on with intensity throughout the day. Doesn't feel like that hand has as much strength.   Review of Systems  Constitutional: Negative for fever and chills.  HENT: Negative.   Eyes: Negative.   Respiratory: Positive for cough. Negative for chest tightness and shortness of breath.   Cardiovascular: Negative for chest pain, palpitations and leg swelling.  Genitourinary: Negative.   Musculoskeletal: Positive for myalgias and gait problem. Negative for back pain and arthralgias.  Skin: Negative.   Neurological: Positive for weakness. Negative for dizziness, syncope, light-headedness, numbness and headaches.      Objective:   Physical Exam  Constitutional: He appears well-developed and well-nourished.  HENT:  Head: Normocephalic and atraumatic.  Eyes: EOM are normal.  Neck: Normal range of motion.  Cardiovascular: Normal rate.   Pulmonary/Chest: Effort normal and breath sounds normal. No respiratory distress. He has no wheezes. He has no rales.  Abdominal: Soft. Bowel sounds are normal.  Musculoskeletal:  Right arm biceps to the shoulder with tenderness. ROM intact with  passive but self limiting with active ROM. No numbness or abnormal sensation. Grip strength equal bilaterally.   Neurological: He is alert. Coordination abnormal.  Slow gait uses walker.  Skin: Skin is warm and dry.   Filed Vitals:   03/06/15 1344  BP: 98/58  Pulse: 84  Temp: 97.9 F (36.6 C)  TempSrc: Oral  Resp: 18  Height: 5\' 6"  (1.676 m)  Weight: 171 lb (77.565 kg)  SpO2: 96%      Assessment & Plan:

## 2015-03-08 NOTE — Assessment & Plan Note (Signed)
Possible muscle strain versus tendon strain. Extends from biceps into the shoulder joint making tendon strain more likely. Grip strength intact. Try voltaren gel and tylenol as well as heat/ice regimen. If no improvement will re-evaluate.

## 2015-03-08 NOTE — Assessment & Plan Note (Signed)
Evaluated by SLP in home and advised on conservative management with head tucking, sitting upright for meals, small bites and sips of water in between. He is intermittently forgetful but his daughter is helpful in reminding him. No signs of aspiration at this time. Will continue conservative management with behavioral modifications.

## 2015-03-16 DIAGNOSIS — Z0279 Encounter for issue of other medical certificate: Secondary | ICD-10-CM

## 2015-03-20 ENCOUNTER — Encounter: Payer: Self-pay | Admitting: Cardiology

## 2015-04-06 ENCOUNTER — Other Ambulatory Visit: Payer: Self-pay | Admitting: Internal Medicine

## 2015-04-30 DIAGNOSIS — G5601 Carpal tunnel syndrome, right upper limb: Secondary | ICD-10-CM | POA: Diagnosis not present

## 2015-04-30 DIAGNOSIS — M19031 Primary osteoarthritis, right wrist: Secondary | ICD-10-CM | POA: Diagnosis not present

## 2015-04-30 DIAGNOSIS — G5602 Carpal tunnel syndrome, left upper limb: Secondary | ICD-10-CM | POA: Diagnosis not present

## 2015-04-30 DIAGNOSIS — M1811 Unilateral primary osteoarthritis of first carpometacarpal joint, right hand: Secondary | ICD-10-CM | POA: Diagnosis not present

## 2015-04-30 DIAGNOSIS — M1812 Unilateral primary osteoarthritis of first carpometacarpal joint, left hand: Secondary | ICD-10-CM | POA: Diagnosis not present

## 2015-04-30 DIAGNOSIS — M19032 Primary osteoarthritis, left wrist: Secondary | ICD-10-CM | POA: Diagnosis not present

## 2015-05-04 ENCOUNTER — Ambulatory Visit (INDEPENDENT_AMBULATORY_CARE_PROVIDER_SITE_OTHER): Payer: Medicare Other | Admitting: Podiatry

## 2015-05-04 ENCOUNTER — Encounter: Payer: Self-pay | Admitting: Podiatry

## 2015-05-04 DIAGNOSIS — M79676 Pain in unspecified toe(s): Secondary | ICD-10-CM

## 2015-05-04 DIAGNOSIS — B351 Tinea unguium: Secondary | ICD-10-CM | POA: Diagnosis not present

## 2015-05-04 DIAGNOSIS — M79673 Pain in unspecified foot: Secondary | ICD-10-CM

## 2015-05-04 NOTE — Progress Notes (Signed)
HPI: Patient presents today for follow up of foot and nail care. Denies any new complaints today.  Objective: Patients chart is reviewed. Vascular status reveals pedal pulses noted at 2 out of 4 dp and pt bilateral . Neurological sensation is Normal to Semmes Weinstein monofilament bilateral. Patients nails are thickened, discolored, distrophic, friable and brittle with yellow-brown discoloration. Patient subjectively relates they are painful with shoes and with ambulation of bilateral feet.  Assessment: Symptomatic onychomycosis  Plan: Discussed treatment options and alternatives. The symptomatic toenails were debrided through manual an mechanical means without complication. Return appointment recommended at routine intervals of 3 months   

## 2015-05-10 ENCOUNTER — Ambulatory Visit: Payer: Medicare Other | Admitting: Internal Medicine

## 2015-05-18 ENCOUNTER — Other Ambulatory Visit: Payer: Self-pay | Admitting: Internal Medicine

## 2015-05-21 ENCOUNTER — Ambulatory Visit (INDEPENDENT_AMBULATORY_CARE_PROVIDER_SITE_OTHER): Payer: Medicare Other | Admitting: *Deleted

## 2015-05-21 ENCOUNTER — Encounter: Payer: Self-pay | Admitting: Internal Medicine

## 2015-05-21 ENCOUNTER — Telehealth: Payer: Self-pay | Admitting: Internal Medicine

## 2015-05-21 ENCOUNTER — Telehealth: Payer: Self-pay | Admitting: Cardiology

## 2015-05-21 DIAGNOSIS — R001 Bradycardia, unspecified: Secondary | ICD-10-CM | POA: Diagnosis not present

## 2015-05-21 NOTE — Telephone Encounter (Signed)
New message      Did we receive the remote transmission?

## 2015-05-21 NOTE — Telephone Encounter (Signed)
Spoke with daughter- remote transmission received.

## 2015-05-21 NOTE — Telephone Encounter (Signed)
Confirmed remote transmission w/ pt wife.   

## 2015-05-22 NOTE — Progress Notes (Signed)
Remote pacemaker transmission.   

## 2015-05-23 LAB — CUP PACEART REMOTE DEVICE CHECK
Battery Impedance: 196 Ohm
Battery Remaining Longevity: 120 mo
Battery Voltage: 2.79 V
Brady Statistic AP VP Percent: 8 %
Brady Statistic AS VP Percent: 3 %
Brady Statistic AS VS Percent: 81 %
Lead Channel Impedance Value: 472 Ohm
Lead Channel Impedance Value: 727 Ohm
Lead Channel Pacing Threshold Amplitude: 1 V
Lead Channel Pacing Threshold Pulse Width: 0.4 ms
Lead Channel Pacing Threshold Pulse Width: 0.4 ms
Lead Channel Sensing Intrinsic Amplitude: 0.7 mV
Lead Channel Sensing Intrinsic Amplitude: 8 mV
Lead Channel Setting Pacing Amplitude: 2 V
Lead Channel Setting Sensing Sensitivity: 4 mV
MDC IDC MSMT LEADCHNL RV PACING THRESHOLD AMPLITUDE: 1.125 V
MDC IDC SESS DTM: 20160711153209
MDC IDC SET LEADCHNL RV PACING AMPLITUDE: 2.5 V
MDC IDC SET LEADCHNL RV PACING PULSEWIDTH: 0.4 ms
MDC IDC STAT BRADY AP VS PERCENT: 9 %

## 2015-05-24 DIAGNOSIS — H6123 Impacted cerumen, bilateral: Secondary | ICD-10-CM | POA: Diagnosis not present

## 2015-06-06 ENCOUNTER — Encounter: Payer: Self-pay | Admitting: *Deleted

## 2015-06-15 DIAGNOSIS — C61 Malignant neoplasm of prostate: Secondary | ICD-10-CM | POA: Diagnosis not present

## 2015-06-20 DIAGNOSIS — G5602 Carpal tunnel syndrome, left upper limb: Secondary | ICD-10-CM | POA: Diagnosis not present

## 2015-06-20 DIAGNOSIS — M1812 Unilateral primary osteoarthritis of first carpometacarpal joint, left hand: Secondary | ICD-10-CM | POA: Diagnosis not present

## 2015-06-29 ENCOUNTER — Other Ambulatory Visit: Payer: Self-pay

## 2015-06-29 MED ORDER — PRAVASTATIN SODIUM 10 MG PO TABS: 10.0000 mg | ORAL_TABLET | Freq: Every evening | ORAL | Status: DC

## 2015-08-06 ENCOUNTER — Other Ambulatory Visit: Payer: Self-pay | Admitting: Internal Medicine

## 2015-08-16 ENCOUNTER — Ambulatory Visit (INDEPENDENT_AMBULATORY_CARE_PROVIDER_SITE_OTHER): Payer: Medicare Other | Admitting: Podiatry

## 2015-08-16 ENCOUNTER — Encounter: Payer: Self-pay | Admitting: Podiatry

## 2015-08-16 DIAGNOSIS — B351 Tinea unguium: Secondary | ICD-10-CM | POA: Diagnosis not present

## 2015-08-16 DIAGNOSIS — M79676 Pain in unspecified toe(s): Secondary | ICD-10-CM | POA: Diagnosis not present

## 2015-08-16 NOTE — Progress Notes (Signed)
Patient ID: [name], male   DOB: 1917/07/02, 81 y.o.   MRN: [id number] Complaint:  Visit Type: Patient returns to my office for continued preventative foot care services. Complaint: Patient states" my nails have grown long and thick and become painful to walk and wear shoes" . The patient presents for preventative foot care services. No changes to ROS  Podiatric Exam: Vascular: dorsalis pedis and posterior tibial pulses are not  palpable bilateral. Capillary return is immediate. Temperature gradient is WNL. Skin turgor WNL  Sensorium: Normal Semmes Weinstein monofilament test. Normal tactile sensation bilaterally. Nail Exam: Pt has thick disfigured discolored nails with subungual debris noted bilateral entire nail hallux through fifth toenails Ulcer Exam: There is no evidence of ulcer or pre-ulcerative changes or infection. Orthopedic Exam: Muscle tone and strength are WNL. No limitations in general ROM. No crepitus or effusions noted. Foot type and digits show no abnormalities. Bony prominences are unremarkable. Skin: No Porokeratosis. No infection or ulcers  Diagnosis:  Onychomycosis, , Pain in right toe, pain in left toes  Treatment & Plan Procedures and Treatment: Consent by patient was obtained for treatment procedures. The patient understood the discussion of treatment and procedures well. All questions were answered thoroughly reviewed. Debridement of mycotic and hypertrophic toenails, 1 through 5 bilateral and clearing of subungual debris. No ulceration, no infection noted.  Return Visit-Office Procedure: Patient instructed to return to the office for a follow up visit 3 months for continued evaluation and treatment.

## 2015-08-20 ENCOUNTER — Ambulatory Visit (INDEPENDENT_AMBULATORY_CARE_PROVIDER_SITE_OTHER): Payer: Medicare Other | Admitting: *Deleted

## 2015-08-20 ENCOUNTER — Encounter: Payer: Self-pay | Admitting: Internal Medicine

## 2015-08-20 DIAGNOSIS — I480 Paroxysmal atrial fibrillation: Secondary | ICD-10-CM

## 2015-08-20 NOTE — Progress Notes (Signed)
Remote pacemaker transmission.   

## 2015-08-23 LAB — CUP PACEART REMOTE DEVICE CHECK
Battery Remaining Longevity: 120 mo
Battery Voltage: 2.79 V
Brady Statistic AP VP Percent: 8 %
Brady Statistic AS VP Percent: 2 %
Brady Statistic AS VS Percent: 82 %
Implantable Lead Implant Date: 20041129
Implantable Lead Implant Date: 20041129
Implantable Lead Location: 753859
Implantable Lead Model: 5076
Lead Channel Pacing Threshold Amplitude: 1 V
Lead Channel Pacing Threshold Amplitude: 1.125 V
Lead Channel Pacing Threshold Pulse Width: 0.4 ms
Lead Channel Sensing Intrinsic Amplitude: 8 mV
Lead Channel Setting Pacing Amplitude: 2 V
Lead Channel Setting Pacing Amplitude: 2.5 V
Lead Channel Setting Sensing Sensitivity: 5.6 mV
MDC IDC LEAD LOCATION: 753860
MDC IDC MSMT BATTERY IMPEDANCE: 196 Ohm
MDC IDC MSMT LEADCHNL RA IMPEDANCE VALUE: 484 Ohm
MDC IDC MSMT LEADCHNL RA PACING THRESHOLD PULSEWIDTH: 0.4 ms
MDC IDC MSMT LEADCHNL RA SENSING INTR AMPL: 1 mV
MDC IDC MSMT LEADCHNL RV IMPEDANCE VALUE: 765 Ohm
MDC IDC SESS DTM: 20161010150428
MDC IDC SET LEADCHNL RV PACING PULSEWIDTH: 0.4 ms
MDC IDC STAT BRADY AP VS PERCENT: 8 %

## 2015-08-27 ENCOUNTER — Encounter: Payer: Self-pay | Admitting: Internal Medicine

## 2015-08-27 ENCOUNTER — Ambulatory Visit (INDEPENDENT_AMBULATORY_CARE_PROVIDER_SITE_OTHER): Payer: Medicare Other | Admitting: Internal Medicine

## 2015-08-27 VITALS — BP 120/72 | HR 101 | Temp 97.8°F | Resp 16 | Ht 66.5 in | Wt 179.0 lb

## 2015-08-27 DIAGNOSIS — I6300 Cerebral infarction due to thrombosis of unspecified precerebral artery: Secondary | ICD-10-CM | POA: Diagnosis not present

## 2015-08-27 DIAGNOSIS — M546 Pain in thoracic spine: Secondary | ICD-10-CM | POA: Diagnosis not present

## 2015-08-27 MED ORDER — TRAMADOL HCL 50 MG PO TABS: 50.0000 mg | ORAL_TABLET | Freq: Three times a day (TID) | ORAL | Status: DC | PRN

## 2015-08-27 NOTE — Patient Instructions (Signed)
We have refilled the tramadol which you can use every 6 hours as needed for pain. You can use tylenol in between if you need more pain control. You can also use a heating pad to help.   If you are not feeling better in 1-2 weeks call us back and we may need to get some imaging of the back.

## 2015-08-27 NOTE — Progress Notes (Signed)
Pre visit review using our clinic review tool, if applicable. No additional management support is needed unless otherwise documented below in the visit note. 

## 2015-08-29 ENCOUNTER — Telehealth: Payer: Self-pay | Admitting: Internal Medicine

## 2015-08-29 DIAGNOSIS — M545 Low back pain, unspecified: Secondary | ICD-10-CM

## 2015-08-29 NOTE — Telephone Encounter (Signed)
Spoke with daughter and she wants to know if physical therapy would help strengthen his legs. She had to lower him to the floor and could not get him up when his legs gave out. She had to call 911 to come get him up. She also said the EMS responders told her that tramadol was not good for a person of his age to be taking, so she stopped it and is only giving him tylenol. Please advise, thanks.

## 2015-08-29 NOTE — Telephone Encounter (Signed)
Pt's daughter, request order for PT for [name]. Pt came in for back pain and last night his leg was really weak and the daughter had to call 911 to come help him get up (she couldn't get him up by herself). Please help, the daughter is concern about this.

## 2015-08-30 DIAGNOSIS — M549 Dorsalgia, unspecified: Secondary | ICD-10-CM | POA: Insufficient documentation

## 2015-08-30 NOTE — Assessment & Plan Note (Signed)
Have asked them to stop the flexeril as much of the pain is midline and not in the muscles to limit sedative agents. Rx for tramadol since it is effective and not causing significant side effects to be used over the next 1-2 weeks. If no improvement we will pursue imaging. No fall or significant event to cause injury. No numbness or weakness to suggest nerve impingement.

## 2015-08-30 NOTE — Telephone Encounter (Signed)
As discussed at visit he can take the tramadol for the pain. Will order PT/OT.

## 2015-08-30 NOTE — Progress Notes (Signed)
   Subjective:    Patient ID: [name], male    DOB: 1916/12/17, 81 y.o.   MRN: [id number]  HPI The patient is a 81 YO man coming in for some mid back pain. No falls or injuries. He is not sure if maybe he was trying to do too much. They did have some flexeril and tramadol at home and have been taking that for 1 day. Seems to have helped but made him slightly sleepy. Denies any numbness in his legs or weakness in his legs. No fevers or chills. No weight change. Overall about stable since onset.   Review of Systems  Constitutional: Negative for fever, chills, activity change, appetite change and unexpected weight change.  Respiratory: Positive for cough. Negative for chest tightness and shortness of breath.   Cardiovascular: Negative for chest pain, palpitations and leg swelling.  Musculoskeletal: Positive for back pain, arthralgias and gait problem. Negative for myalgias.  Skin: Negative.   Neurological: Negative for dizziness, syncope, weakness, light-headedness, numbness and headaches.      Objective:   Physical Exam  Constitutional: He appears well-developed and well-nourished.  HENT:  Head: Normocephalic and atraumatic.  Eyes: EOM are normal.  Neck: Normal range of motion.  Cardiovascular: Normal rate.   Pulmonary/Chest: Effort normal and breath sounds normal. No respiratory distress. He has no wheezes. He has no rales.  Abdominal: Soft. Bowel sounds are normal.  Musculoskeletal:  Tenderness in the mid back along the spine without significant tenderness in the paraspinal muscles.   Neurological: He is alert. Coordination abnormal.  Slow gait uses walker.  Skin: Skin is warm and dry.   Filed Vitals:   08/27/15 1321  BP: 120/72  Pulse: 101  Temp: 97.8 F (36.6 C)  TempSrc: Oral  Resp: 16  Height: 5' 6.5" (1.689 m)  Weight: 179 lb (81.194 kg)  SpO2: 96%      Assessment & Plan:

## 2015-08-31 NOTE — Telephone Encounter (Signed)
Called and spoke with patient's wife and informed her that the patient can take tramadol and that PT and OT has been ordered.

## 2015-09-04 ENCOUNTER — Ambulatory Visit: Payer: Medicare Other | Admitting: Internal Medicine

## 2015-09-07 ENCOUNTER — Encounter: Payer: Self-pay | Admitting: Cardiology

## 2015-09-07 DIAGNOSIS — Z23 Encounter for immunization: Secondary | ICD-10-CM | POA: Diagnosis not present

## 2015-09-10 ENCOUNTER — Other Ambulatory Visit: Payer: Self-pay | Admitting: Internal Medicine

## 2015-09-13 DIAGNOSIS — Z8673 Personal history of transient ischemic attack (TIA), and cerebral infarction without residual deficits: Secondary | ICD-10-CM | POA: Diagnosis not present

## 2015-09-13 DIAGNOSIS — I4891 Unspecified atrial fibrillation: Secondary | ICD-10-CM | POA: Diagnosis not present

## 2015-09-13 DIAGNOSIS — M810 Age-related osteoporosis without current pathological fracture: Secondary | ICD-10-CM | POA: Diagnosis not present

## 2015-09-13 DIAGNOSIS — I129 Hypertensive chronic kidney disease with stage 1 through stage 4 chronic kidney disease, or unspecified chronic kidney disease: Secondary | ICD-10-CM | POA: Diagnosis not present

## 2015-09-13 DIAGNOSIS — R1310 Dysphagia, unspecified: Secondary | ICD-10-CM | POA: Diagnosis not present

## 2015-09-13 DIAGNOSIS — I2510 Atherosclerotic heart disease of native coronary artery without angina pectoris: Secondary | ICD-10-CM | POA: Diagnosis not present

## 2015-09-13 DIAGNOSIS — M19071 Primary osteoarthritis, right ankle and foot: Secondary | ICD-10-CM | POA: Diagnosis not present

## 2015-09-13 DIAGNOSIS — M19072 Primary osteoarthritis, left ankle and foot: Secondary | ICD-10-CM | POA: Diagnosis not present

## 2015-09-13 DIAGNOSIS — N189 Chronic kidney disease, unspecified: Secondary | ICD-10-CM | POA: Diagnosis not present

## 2015-09-13 DIAGNOSIS — R262 Difficulty in walking, not elsewhere classified: Secondary | ICD-10-CM | POA: Diagnosis not present

## 2015-09-18 DIAGNOSIS — R262 Difficulty in walking, not elsewhere classified: Secondary | ICD-10-CM | POA: Diagnosis not present

## 2015-09-18 DIAGNOSIS — M19071 Primary osteoarthritis, right ankle and foot: Secondary | ICD-10-CM | POA: Diagnosis not present

## 2015-09-18 DIAGNOSIS — M19072 Primary osteoarthritis, left ankle and foot: Secondary | ICD-10-CM | POA: Diagnosis not present

## 2015-09-18 DIAGNOSIS — I2510 Atherosclerotic heart disease of native coronary artery without angina pectoris: Secondary | ICD-10-CM | POA: Diagnosis not present

## 2015-09-18 DIAGNOSIS — N189 Chronic kidney disease, unspecified: Secondary | ICD-10-CM | POA: Diagnosis not present

## 2015-09-18 DIAGNOSIS — I129 Hypertensive chronic kidney disease with stage 1 through stage 4 chronic kidney disease, or unspecified chronic kidney disease: Secondary | ICD-10-CM | POA: Diagnosis not present

## 2015-09-19 DIAGNOSIS — H6123 Impacted cerumen, bilateral: Secondary | ICD-10-CM | POA: Diagnosis not present

## 2015-09-20 DIAGNOSIS — R262 Difficulty in walking, not elsewhere classified: Secondary | ICD-10-CM | POA: Diagnosis not present

## 2015-09-20 DIAGNOSIS — I129 Hypertensive chronic kidney disease with stage 1 through stage 4 chronic kidney disease, or unspecified chronic kidney disease: Secondary | ICD-10-CM | POA: Diagnosis not present

## 2015-09-20 DIAGNOSIS — N189 Chronic kidney disease, unspecified: Secondary | ICD-10-CM | POA: Diagnosis not present

## 2015-09-20 DIAGNOSIS — I2510 Atherosclerotic heart disease of native coronary artery without angina pectoris: Secondary | ICD-10-CM | POA: Diagnosis not present

## 2015-09-20 DIAGNOSIS — M19071 Primary osteoarthritis, right ankle and foot: Secondary | ICD-10-CM | POA: Diagnosis not present

## 2015-09-20 DIAGNOSIS — M19072 Primary osteoarthritis, left ankle and foot: Secondary | ICD-10-CM | POA: Diagnosis not present

## 2015-09-21 ENCOUNTER — Encounter: Payer: Self-pay | Admitting: Cardiology

## 2015-09-25 DIAGNOSIS — I2510 Atherosclerotic heart disease of native coronary artery without angina pectoris: Secondary | ICD-10-CM | POA: Diagnosis not present

## 2015-09-25 DIAGNOSIS — N189 Chronic kidney disease, unspecified: Secondary | ICD-10-CM | POA: Diagnosis not present

## 2015-09-25 DIAGNOSIS — M19072 Primary osteoarthritis, left ankle and foot: Secondary | ICD-10-CM | POA: Diagnosis not present

## 2015-09-25 DIAGNOSIS — R262 Difficulty in walking, not elsewhere classified: Secondary | ICD-10-CM | POA: Diagnosis not present

## 2015-09-25 DIAGNOSIS — I129 Hypertensive chronic kidney disease with stage 1 through stage 4 chronic kidney disease, or unspecified chronic kidney disease: Secondary | ICD-10-CM | POA: Diagnosis not present

## 2015-09-25 DIAGNOSIS — M19071 Primary osteoarthritis, right ankle and foot: Secondary | ICD-10-CM | POA: Diagnosis not present

## 2015-09-27 DIAGNOSIS — I129 Hypertensive chronic kidney disease with stage 1 through stage 4 chronic kidney disease, or unspecified chronic kidney disease: Secondary | ICD-10-CM | POA: Diagnosis not present

## 2015-09-27 DIAGNOSIS — R262 Difficulty in walking, not elsewhere classified: Secondary | ICD-10-CM | POA: Diagnosis not present

## 2015-09-27 DIAGNOSIS — M19071 Primary osteoarthritis, right ankle and foot: Secondary | ICD-10-CM | POA: Diagnosis not present

## 2015-09-27 DIAGNOSIS — I2510 Atherosclerotic heart disease of native coronary artery without angina pectoris: Secondary | ICD-10-CM | POA: Diagnosis not present

## 2015-09-27 DIAGNOSIS — M19072 Primary osteoarthritis, left ankle and foot: Secondary | ICD-10-CM | POA: Diagnosis not present

## 2015-09-27 DIAGNOSIS — N189 Chronic kidney disease, unspecified: Secondary | ICD-10-CM | POA: Diagnosis not present

## 2015-10-02 DIAGNOSIS — N189 Chronic kidney disease, unspecified: Secondary | ICD-10-CM | POA: Diagnosis not present

## 2015-10-02 DIAGNOSIS — I129 Hypertensive chronic kidney disease with stage 1 through stage 4 chronic kidney disease, or unspecified chronic kidney disease: Secondary | ICD-10-CM | POA: Diagnosis not present

## 2015-10-02 DIAGNOSIS — I2510 Atherosclerotic heart disease of native coronary artery without angina pectoris: Secondary | ICD-10-CM | POA: Diagnosis not present

## 2015-10-02 DIAGNOSIS — R262 Difficulty in walking, not elsewhere classified: Secondary | ICD-10-CM | POA: Diagnosis not present

## 2015-10-02 DIAGNOSIS — M19071 Primary osteoarthritis, right ankle and foot: Secondary | ICD-10-CM | POA: Diagnosis not present

## 2015-10-02 DIAGNOSIS — M19072 Primary osteoarthritis, left ankle and foot: Secondary | ICD-10-CM | POA: Diagnosis not present

## 2015-10-03 DIAGNOSIS — M19072 Primary osteoarthritis, left ankle and foot: Secondary | ICD-10-CM | POA: Diagnosis not present

## 2015-10-03 DIAGNOSIS — R262 Difficulty in walking, not elsewhere classified: Secondary | ICD-10-CM | POA: Diagnosis not present

## 2015-10-03 DIAGNOSIS — M19071 Primary osteoarthritis, right ankle and foot: Secondary | ICD-10-CM | POA: Diagnosis not present

## 2015-10-03 DIAGNOSIS — N189 Chronic kidney disease, unspecified: Secondary | ICD-10-CM | POA: Diagnosis not present

## 2015-10-03 DIAGNOSIS — I2510 Atherosclerotic heart disease of native coronary artery without angina pectoris: Secondary | ICD-10-CM | POA: Diagnosis not present

## 2015-10-03 DIAGNOSIS — I129 Hypertensive chronic kidney disease with stage 1 through stage 4 chronic kidney disease, or unspecified chronic kidney disease: Secondary | ICD-10-CM | POA: Diagnosis not present

## 2015-10-09 DIAGNOSIS — I129 Hypertensive chronic kidney disease with stage 1 through stage 4 chronic kidney disease, or unspecified chronic kidney disease: Secondary | ICD-10-CM | POA: Diagnosis not present

## 2015-10-09 DIAGNOSIS — M19072 Primary osteoarthritis, left ankle and foot: Secondary | ICD-10-CM | POA: Diagnosis not present

## 2015-10-09 DIAGNOSIS — I2510 Atherosclerotic heart disease of native coronary artery without angina pectoris: Secondary | ICD-10-CM | POA: Diagnosis not present

## 2015-10-09 DIAGNOSIS — M19071 Primary osteoarthritis, right ankle and foot: Secondary | ICD-10-CM | POA: Diagnosis not present

## 2015-10-09 DIAGNOSIS — R262 Difficulty in walking, not elsewhere classified: Secondary | ICD-10-CM | POA: Diagnosis not present

## 2015-10-09 DIAGNOSIS — N189 Chronic kidney disease, unspecified: Secondary | ICD-10-CM | POA: Diagnosis not present

## 2015-10-11 DIAGNOSIS — M19072 Primary osteoarthritis, left ankle and foot: Secondary | ICD-10-CM | POA: Diagnosis not present

## 2015-10-11 DIAGNOSIS — N189 Chronic kidney disease, unspecified: Secondary | ICD-10-CM | POA: Diagnosis not present

## 2015-10-11 DIAGNOSIS — I2510 Atherosclerotic heart disease of native coronary artery without angina pectoris: Secondary | ICD-10-CM | POA: Diagnosis not present

## 2015-10-11 DIAGNOSIS — R262 Difficulty in walking, not elsewhere classified: Secondary | ICD-10-CM | POA: Diagnosis not present

## 2015-10-11 DIAGNOSIS — M19071 Primary osteoarthritis, right ankle and foot: Secondary | ICD-10-CM | POA: Diagnosis not present

## 2015-10-11 DIAGNOSIS — I129 Hypertensive chronic kidney disease with stage 1 through stage 4 chronic kidney disease, or unspecified chronic kidney disease: Secondary | ICD-10-CM | POA: Diagnosis not present

## 2015-10-29 ENCOUNTER — Other Ambulatory Visit: Payer: Self-pay | Admitting: Internal Medicine

## 2015-11-14 ENCOUNTER — Encounter: Payer: Self-pay | Admitting: Podiatry

## 2015-11-14 ENCOUNTER — Ambulatory Visit (INDEPENDENT_AMBULATORY_CARE_PROVIDER_SITE_OTHER): Payer: Medicare Other | Admitting: Podiatry

## 2015-11-14 DIAGNOSIS — M79676 Pain in unspecified toe(s): Secondary | ICD-10-CM | POA: Diagnosis not present

## 2015-11-14 DIAGNOSIS — B351 Tinea unguium: Secondary | ICD-10-CM

## 2015-11-14 NOTE — Progress Notes (Signed)
Patient ID: [name], male   DOB: [birth year], 82 y.o.   MRN: [id number] Complaint:  Visit Type: Patient returns to my office for continued preventative foot care services. Complaint: Patient states" my nails have grown long and thick and become painful to walk and wear shoes" . The patient presents for preventative foot care services. No changes to ROS  Podiatric Exam: Vascular: dorsalis pedis and posterior tibial pulses are not  palpable bilateral. Capillary return is immediate. Temperature gradient is WNL. Skin turgor WNL  Sensorium: Normal Semmes Weinstein monofilament test. Normal tactile sensation bilaterally. Nail Exam: Pt has thick disfigured discolored nails with subungual debris noted bilateral entire nail hallux through fifth toenails Ulcer Exam: There is no evidence of ulcer or pre-ulcerative changes or infection. Orthopedic Exam: Muscle tone and strength are WNL. No limitations in general ROM. No crepitus or effusions noted. Foot type and digits show no abnormalities. Bony prominences are unremarkable. Skin: No Porokeratosis. No infection or ulcers  Diagnosis:  Onychomycosis, , Pain in right toe, pain in left toes  Treatment & Plan Procedures and Treatment: Consent by patient was obtained for treatment procedures. The patient understood the discussion of treatment and procedures well. All questions were answered thoroughly reviewed. Debridement of mycotic and hypertrophic toenails, 1 through 5 bilateral and clearing of subungual debris. No ulceration, no infection noted.  Return Visit-Office Procedure: Patient instructed to return to the office for a follow up visit 3 months for continued evaluation and treatment.   Gardiner Barefoot DPM

## 2015-12-24 ENCOUNTER — Other Ambulatory Visit: Payer: Self-pay | Admitting: Internal Medicine

## 2015-12-28 ENCOUNTER — Ambulatory Visit (INDEPENDENT_AMBULATORY_CARE_PROVIDER_SITE_OTHER): Payer: Medicare Other | Admitting: Internal Medicine

## 2015-12-28 ENCOUNTER — Encounter: Payer: Self-pay | Admitting: Internal Medicine

## 2015-12-28 VITALS — BP 112/64 | HR 83 | Ht 66.25 in | Wt 180.0 lb

## 2015-12-28 DIAGNOSIS — I480 Paroxysmal atrial fibrillation: Secondary | ICD-10-CM | POA: Diagnosis not present

## 2015-12-28 DIAGNOSIS — Z950 Presence of cardiac pacemaker: Secondary | ICD-10-CM | POA: Diagnosis not present

## 2015-12-28 MED ORDER — APIXABAN 2.5 MG PO TABS: 2.5000 mg | ORAL_TABLET | Freq: Two times a day (BID) | ORAL | Status: DC

## 2015-12-28 NOTE — Progress Notes (Signed)
HPI [name] returns today for followup. He is a very pleasant 82 year old man with a history of symptomatic bradycardia, status post permanent pacemaker insertion. He also is a history of hypertension, coronary disease, and prostate cancer. In the interim, he has done well. He underwent pacemaker generator change out over a year ago. 2 years ago, he fractured his sacrum and had a laceration on the scalp. His warfarin was stopped. Since then he has not fallen.  He denies chest pain or shortness of breath. No syncope. He has had a TIA in the past.  No Known Allergies   Current Outpatient Prescriptions  Medication Sig Dispense Refill  . acetaminophen (TYLENOL) 325 MG tablet Take 650 mg by mouth every 6 (six) hours as needed for mild pain.    Marland Kitchen diclofenac sodium (VOLTAREN) 1 % GEL Apply 2 g topically 3 (three) times daily as needed. 100 g 3  . furosemide (LASIX) 40 MG tablet Take 1 tablet (40 mg total) by mouth as needed for edema. 30 tablet 3  . furosemide (LASIX) 40 MG tablet TAKE 1/2 TABLET BY MOUTH DAILY 30 tablet 3  . Lutein 20 MG CAPS Take by mouth.    . Magnesium 500 MG CAPS Take 1 capsule by mouth 2 (two) times a week. Take one by twice weekly    . Misc Natural Products (LUTEIN 20 PO) Take 1 tablet by mouth daily.     . multivitamin-lutein (OCUVITE-LUTEIN) CAPS capsule Take 1 capsule by mouth daily. 20 MG dAILY    . nitroGLYCERIN (NITROSTAT) 0.4 MG SL tablet Place 1 tablet (0.4 mg total) under the tongue every 5 (five) minutes as needed. 25 tablet 3  . potassium chloride (K-DUR) 10 MEQ tablet TAKE 1 TABLET (10 MEQ TOTAL) BY MOUTH DAILY. 90 tablet 3  . pravastatin (PRAVACHOL) 10 MG tablet Take 1 tablet (10 mg total) by mouth every evening. 90 tablet 1  . ranitidine (ZANTAC) 150 MG tablet TAKE 1 TABLET (150 MG TOTAL) BY MOUTH AT BEDTIME. 30 tablet 10  . vitamin C (ASCORBIC ACID) 500 MG tablet Take 1 tablet (500 mg total) by mouth daily.    Marland Kitchen apixaban (ELIQUIS) 2.5 MG TABS tablet  Take 1 tablet (2.5 mg total) by mouth 2 (two) times daily. 60 tablet 0  . apixaban (ELIQUIS) 2.5 MG TABS tablet Take 1 tablet (2.5 mg total) by mouth 2 (two) times daily. 60 tablet 10  . traMADol (ULTRAM) 50 MG tablet Take 1 tablet (50 mg total) by mouth every 8 (eight) hours as needed. (Patient not taking: Reported on 12/28/2015) 60 tablet 0   No current facility-administered medications for this visit.     Past Medical History  Diagnosis Date  . Paroxysmal atrial fibrillation (HCC)   . CAD (coronary artery disease)     CABG 08/1973  . CHF (congestive heart failure) ([place])     ICM  . Pacemaker 09/2003    RESULTS:  This demonstrates successful implantation of a Medtronic dual  . Hypertension   . Hyperlipidemia   . Unsteady gait     due to B ankle DJD and instability  . Memory loss   . Chronic renal insufficiency   . Barrett's esophagus   . Diverticulosis of colon   . GERD (gastroesophageal reflux disease)   . Adenocarcinoma of prostate ([place]) 10/2005    lupron injections q46mo  . Atrial fibrillation (HCC)     chronic anticoag  . Arthritis     ?rheumatoid, never  dx or on DMARDs  . Osteoporosis     ROS:   All systems reviewed and negative except as noted in the HPI.   Past Surgical History  Procedure Laterality Date  . Pacemaker placement  09/2003  . Hernia repair      age 66 ( not sure if it was right or left side)  . Coronary artery bypass graft  08/1973    3 heart heart arteries were 80 percent clogged. the lower valve of my heart is not functioning  . Doppler echocardiography  2004, 2008  . Pacemaker generator change N/A 05/02/2013    Procedure: PACEMAKER GENERATOR CHANGE;  Surgeon: Evans Lance, MD;  Location: [facility] CATH LAB;  Service: Cardiovascular;  Laterality: N/A;     Family History  Problem Relation Age of Onset  . Cancer Mother   . Heart attack Father      Social History   Social History  . Marital Status: Married    Spouse Name: N/A  . Number of  Children: N/A  . Years of Education: N/A   Occupational History  . Not on file.   Social History Main Topics  . Smoking status: Former Smoker    Quit date: 08/05/1986  . Smokeless tobacco: Not on file  . Alcohol Use: Yes     Comment: samll glass of wine  . Drug Use: Not on file  . Sexual Activity: Not on file   Other Topics Concern  . Not on file   Social History Narrative   Married  - lives with spouse and supportive daugher   Former Smoker -  quit tobacco 25 years ago     Alcohol use-yes (small glass of wine)                    BP 112/64 mmHg  Pulse 83  Ht 5' 6.25" (1.683 m)  Wt 180 lb (81.647 kg)  BMI 28.83 kg/m2  Physical Exam:  Well appearing elderly man, NAD HEENT: Unremarkable Neck:  No JVD, no thyromegally Lymphatics:  No adenopathy Back:  No CVA tenderness Lungs:  Clear, except for rare basilar rales. No wheezes or rhonchi. Well-healed pacemaker incision. HEART:  Regular rate rhythm, no murmurs, no rubs, no clicks Abd:  soft, positive bowel sounds, no organomegally, no rebound, no guarding Ext:  2 plus pulses, no edema, no cyanosis, no clubbing Skin:  No rashes no nodules Neuro:  CN II through XII intact, motor grossly intact   DEVICE  Normal device function.  See PaceArt for details.   Assess/Plan: 1. Atrial fib - he has PAF on his PPM interogation. Because he has not fallen in over 2 years and his CHADSVASC is over 6, I have recommended he restart anti-coagulation. Will start eliquis and stop Plavix. 2. HTN - his blood pressure is well controlled. No change in meds. 3. Sinus node dysfunction - he is asymptomatic and paces less than 20/% of the time. 4. PPM - his Medtronic DDD PM is working normally. Will recheck in several months.  [name].D.

## 2015-12-28 NOTE — Patient Instructions (Addendum)
Medication Instructions:  1) STOP Plavix 2) START Eliquis 2.5 mg twice a day  Labwork: None ordered  Testing/Procedures: None ordered  Follow-Up: Remote monitoring is used to monitor your Pacemaker of ICD from home. This monitoring reduces the number of office visits required to check your device to one time per year. It allows [place] to keep an eye on the functioning of your device to ensure it is working properly. You are scheduled for a device check from home on 03/31/2016. You may send your transmission at any time that day. If you have a wireless device, the transmission will be sent automatically. After your physician reviews your transmission, you will receive a postcard with your next transmission date.  Your physician wants you to follow-up in: 1 year with Dr. Lovena Le.  You will receive a reminder letter in the mail two months in advance. If you don't receive a letter, please call our office to schedule the follow-up appointment.  If you need a refill on your cardiac medications before your next appointment, please call your pharmacy.  Thank you for choosing [facility]!!

## 2015-12-30 LAB — CUP PACEART INCLINIC DEVICE CHECK
Brady Statistic AP VP Percent: 6.8 %
Brady Statistic AS VP Percent: 2 %
Brady Statistic AS VS Percent: 83.3 %
Date Time Interrogation Session: 20170219151450
Implantable Lead Implant Date: 20041129
Implantable Lead Implant Date: 20041129
Implantable Lead Location: 753859
Implantable Lead Model: 5076
Lead Channel Pacing Threshold Pulse Width: 0.4 ms
Lead Channel Sensing Intrinsic Amplitude: 11.2 mV
Lead Channel Sensing Intrinsic Amplitude: 2 mV
Lead Channel Setting Pacing Amplitude: 2 V
Lead Channel Setting Pacing Amplitude: 2.5 V
MDC IDC LEAD LOCATION: 753860
MDC IDC MSMT LEADCHNL RA PACING THRESHOLD AMPLITUDE: 1 V
MDC IDC MSMT LEADCHNL RV PACING THRESHOLD AMPLITUDE: 1.25 V
MDC IDC MSMT LEADCHNL RV PACING THRESHOLD PULSEWIDTH: 0.4 ms
MDC IDC SET LEADCHNL RV PACING PULSEWIDTH: 0.4 ms
MDC IDC SET LEADCHNL RV SENSING SENSITIVITY: 5.6 mV
MDC IDC STAT BRADY AP VS PERCENT: 7.9 %

## 2016-01-07 DIAGNOSIS — C61 Malignant neoplasm of prostate: Secondary | ICD-10-CM | POA: Diagnosis not present

## 2016-01-10 ENCOUNTER — Other Ambulatory Visit: Payer: Self-pay

## 2016-01-10 MED ORDER — PRAVASTATIN SODIUM 10 MG PO TABS: 10.0000 mg | ORAL_TABLET | Freq: Every evening | ORAL | Status: DC

## 2016-01-15 IMAGING — CT CT L SPINE W/O CM
1 series · 12 of 14 positions shown, 15 images · non-contrast
Comparison: 03/16/2014 and 01/05/2009

CLINICAL DATA: Fall and complains of severe low back pain.

EXAM:
CT LUMBAR SPINE WITHOUT CONTRAST
TECHNIQUE: Multidetector CT imaging of the lumbar spine was performed without
intravenous contrast administration. Multiplanar CT image
reconstructions were also generated.

[Series 4: l-spine · axial · 0.35mm/px · z∈[-210,+48]mm · 12 of 153 slices shown, 15 images]
[im 12/153  soft-tissue]
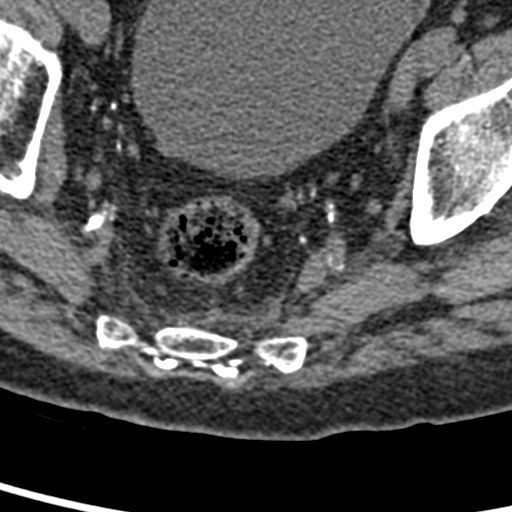
[im 12/153  bone]
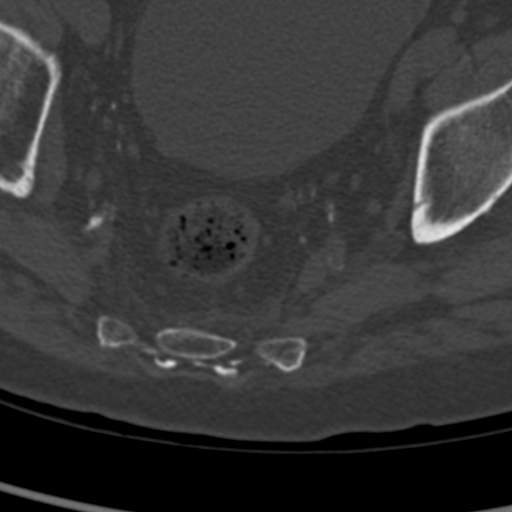
[im 24/153  bone]
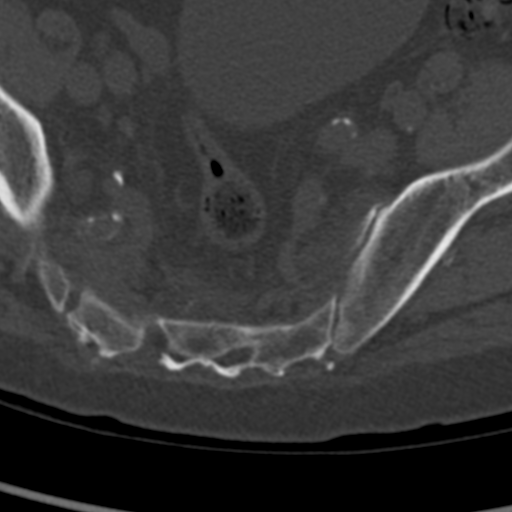
[im 36/153  bone]
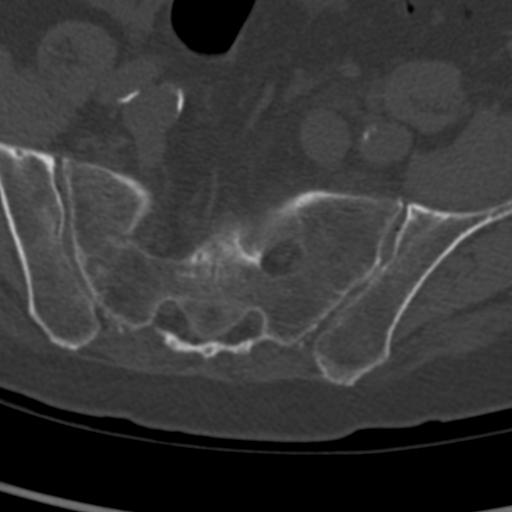
[im 47/153  bone]
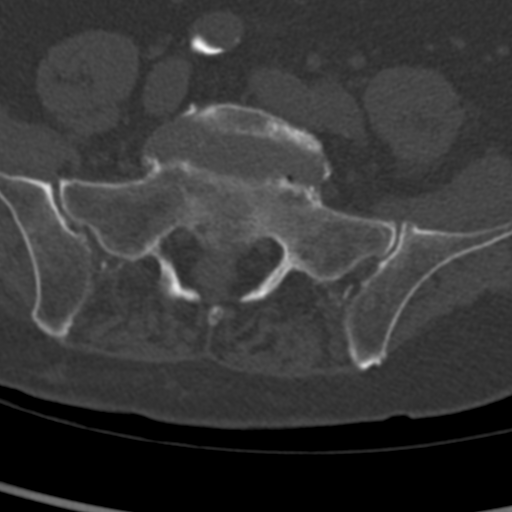
[im 59/153  soft-tissue]
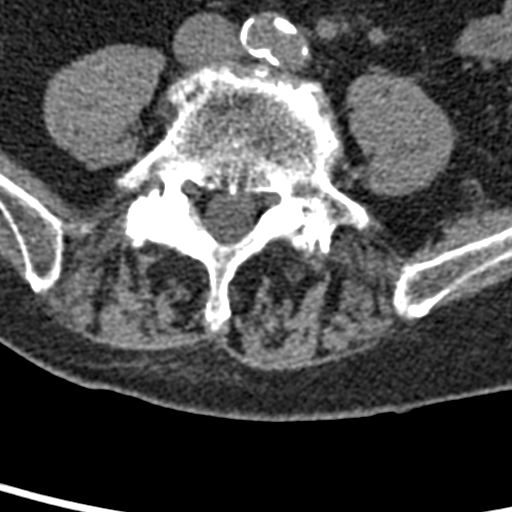
[im 59/153  bone]
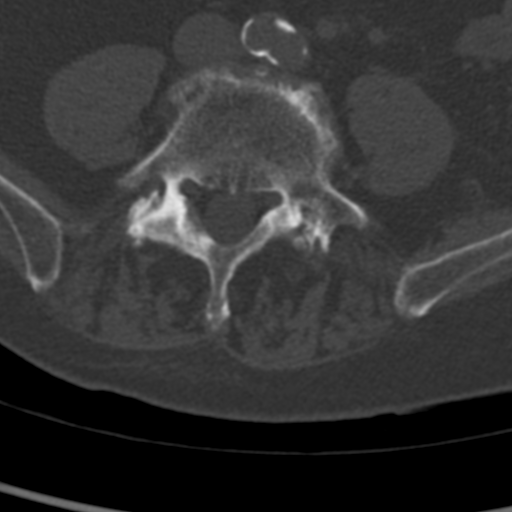
[im 71/153  bone]
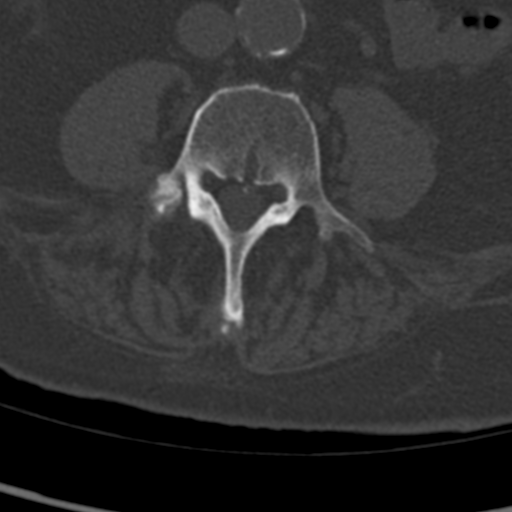
[im 82/153  bone]
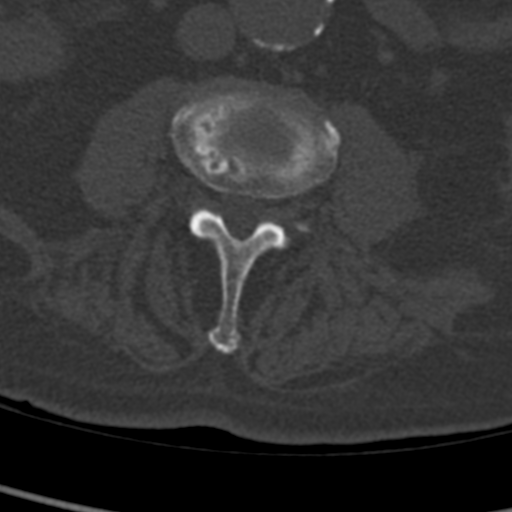
[im 94/153  bone]
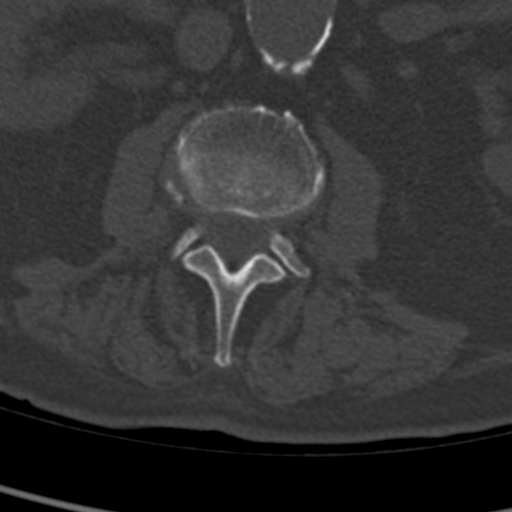
[im 106/153  soft-tissue]
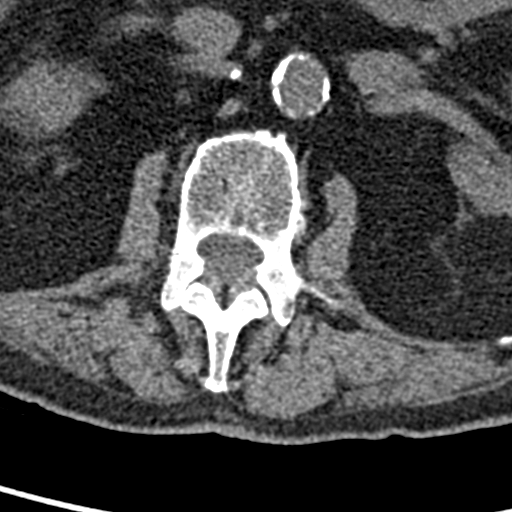
[im 106/153  bone]
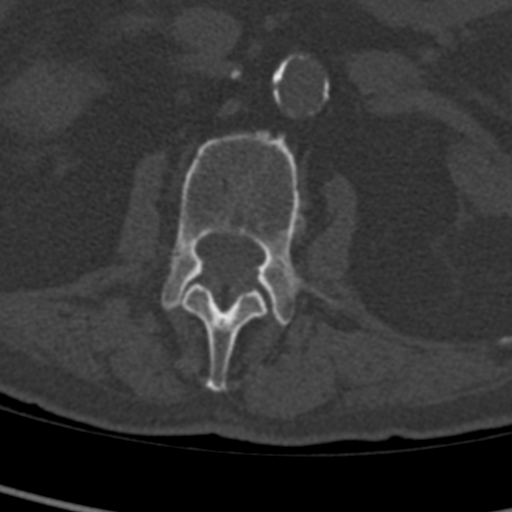
[im 117/153  bone]
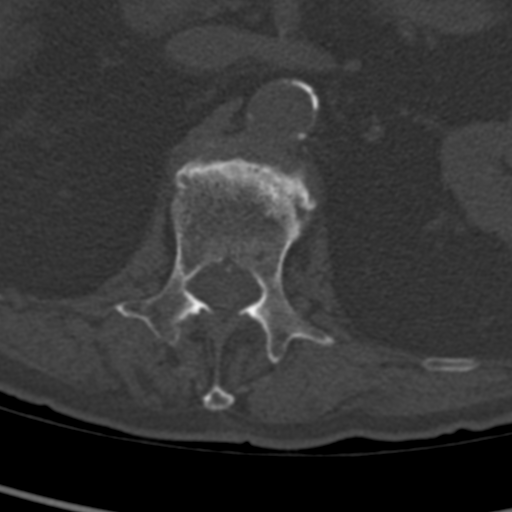
[im 129/153  bone]
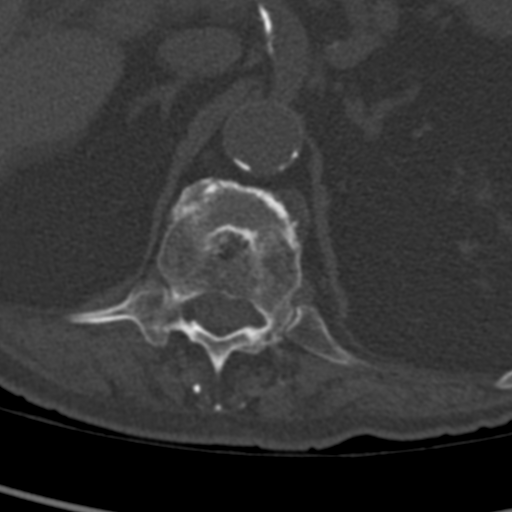
[im 141/153  bone]
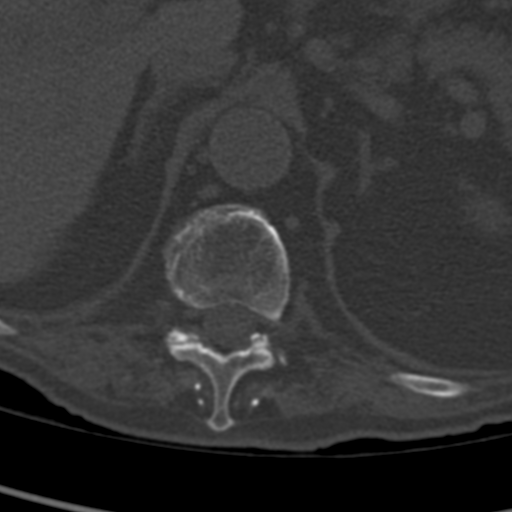

[12 of 14 positions shown; findings below may reference images not displayed]

FINDINGS: The abdominal aorta measures at least 3.6 cm but incompletely
imaged. Degenerative facet disease in the lower lumbar spine. There
is no evidence for an acute fracture in the lumbar spine. There is
an old compression fracture at T12. Vacuum disc phenomena at L1-L2.
There are fractures involving the sacral ala on both sides.
Fractures are mildly displaced. There may be a small amount of
presacral edema suggesting acute sacral fractures. Sacral fractures
are minimally displaced on the sagittal reformats, sequence 7, image
27.
IMPRESSION: Bilateral sacral fractures.  Fractures are minimally displaced.

Multilevel degenerative changes in lumbar spine. No acute bone
abnormality in the lumbar spine.

Old compression deformity at T12.

Abdominal aortic aneurysm measuring at least 3.6 cm.

## 2016-01-21 ENCOUNTER — Other Ambulatory Visit: Payer: Self-pay | Admitting: Internal Medicine

## 2016-01-23 ENCOUNTER — Other Ambulatory Visit: Payer: Self-pay | Admitting: Internal Medicine

## 2016-01-29 DIAGNOSIS — H6123 Impacted cerumen, bilateral: Secondary | ICD-10-CM | POA: Diagnosis not present

## 2016-02-13 ENCOUNTER — Encounter: Payer: Self-pay | Admitting: Podiatry

## 2016-02-13 ENCOUNTER — Ambulatory Visit (INDEPENDENT_AMBULATORY_CARE_PROVIDER_SITE_OTHER): Payer: Medicare Other | Admitting: Podiatry

## 2016-02-13 DIAGNOSIS — M79676 Pain in unspecified toe(s): Secondary | ICD-10-CM | POA: Diagnosis not present

## 2016-02-13 DIAGNOSIS — M79673 Pain in unspecified foot: Secondary | ICD-10-CM | POA: Diagnosis not present

## 2016-02-13 DIAGNOSIS — B351 Tinea unguium: Secondary | ICD-10-CM

## 2016-02-13 NOTE — Progress Notes (Signed)
Patient ID: NARAYAN WAKELAND, male   DOB: [birth year], 82 y.o.   MRN: [id number] Complaint:  Visit Type: Patient returns to my office for continued preventative foot care services. Complaint: Patient states" my nails have grown long and thick and become painful to walk and wear shoes" . The patient presents for preventative foot care services. No changes to ROS  Podiatric Exam: Vascular: dorsalis pedis and posterior tibial pulses are not  palpable bilateral. Capillary return is immediate. Temperature gradient is WNL. Skin turgor WNL  Sensorium: Normal Semmes Weinstein monofilament test. Normal tactile sensation bilaterally. Nail Exam: Pt has thick disfigured discolored nails with subungual debris noted bilateral entire nail hallux through fifth toenails Ulcer Exam: There is no evidence of ulcer or pre-ulcerative changes or infection. Orthopedic Exam: Muscle tone and strength are WNL. No limitations in general ROM. No crepitus or effusions noted. Foot type and digits show no abnormalities. Bony prominences are unremarkable. Skin: No Porokeratosis. No infection or ulcers  Diagnosis:  Onychomycosis, , Pain in right toe, pain in left toes  Treatment & Plan Procedures and Treatment: Consent by patient was obtained for treatment procedures. The patient understood the discussion of treatment and procedures well. All questions were answered thoroughly reviewed. Debridement of mycotic and hypertrophic toenails, 1 through 5 bilateral and clearing of subungual debris. No ulceration, no infection noted.  Return Visit-Office Procedure: Patient instructed to return to the office for a follow up visit 3 months for continued evaluation and treatment.   Gardiner Barefoot DPM

## 2016-03-31 ENCOUNTER — Telehealth: Payer: Self-pay | Admitting: Cardiology

## 2016-03-31 ENCOUNTER — Encounter: Payer: Medicare Other | Admitting: *Deleted

## 2016-03-31 NOTE — Telephone Encounter (Signed)
Confirmed remote transmission w/ pt daughter.   

## 2016-04-04 ENCOUNTER — Encounter: Payer: Self-pay | Admitting: Cardiology

## 2016-04-14 ENCOUNTER — Ambulatory Visit (INDEPENDENT_AMBULATORY_CARE_PROVIDER_SITE_OTHER): Payer: Medicare Other | Admitting: *Deleted

## 2016-04-14 ENCOUNTER — Telehealth: Payer: Self-pay | Admitting: Internal Medicine

## 2016-04-14 DIAGNOSIS — R001 Bradycardia, unspecified: Secondary | ICD-10-CM

## 2016-04-14 DIAGNOSIS — Z950 Presence of cardiac pacemaker: Secondary | ICD-10-CM | POA: Diagnosis not present

## 2016-04-14 NOTE — Telephone Encounter (Signed)
New message    Daughter calling regarding remote transition x 3 times last week - need to speak with someone

## 2016-04-14 NOTE — Telephone Encounter (Signed)
Patient's daughter cannot get enough signal anywhere in the house to send the transmission. She will try this afternoon to send a transmission using the landline phone. She will call back to check the status- I gave her the direct # to device clinic. I did make her aware of the Wire T adapter if need be- they do have internet.

## 2016-04-15 NOTE — Telephone Encounter (Signed)
Called patient's daughter to make her aware that transmission was received on 04/14/16 and that it is within normal limits for the patient's device.  Patient's daughter verbalizes understanding and is appreciative.

## 2016-04-17 NOTE — Progress Notes (Signed)
Remote pacemaker transmission.   

## 2016-04-24 LAB — CUP PACEART REMOTE DEVICE CHECK
Battery Impedance: 221 Ohm
Battery Remaining Longevity: 123 mo
Battery Voltage: 2.79 V
Brady Statistic AP VP Percent: 8 %
Date Time Interrogation Session: 20170605173036
Implantable Lead Implant Date: 20041129
Implantable Lead Location: 753860
Implantable Lead Model: 5076
Implantable Lead Model: 5076
Lead Channel Impedance Value: 672 Ohm
Lead Channel Setting Pacing Amplitude: 2 V
Lead Channel Setting Pacing Pulse Width: 0.4 ms
Lead Channel Setting Sensing Sensitivity: 4 mV
MDC IDC LEAD IMPLANT DT: 20041129
MDC IDC LEAD LOCATION: 753859
MDC IDC MSMT LEADCHNL RA IMPEDANCE VALUE: 454 Ohm
MDC IDC SET LEADCHNL RV PACING AMPLITUDE: 2.75 V
MDC IDC STAT BRADY AP VS PERCENT: 12 %
MDC IDC STAT BRADY AS VP PERCENT: 2 %
MDC IDC STAT BRADY AS VS PERCENT: 78 %

## 2016-04-30 ENCOUNTER — Encounter: Payer: Self-pay | Admitting: Cardiology

## 2016-05-07 ENCOUNTER — Encounter: Payer: Self-pay | Admitting: Podiatry

## 2016-05-07 ENCOUNTER — Ambulatory Visit (INDEPENDENT_AMBULATORY_CARE_PROVIDER_SITE_OTHER): Payer: Medicare Other | Admitting: Podiatry

## 2016-05-07 DIAGNOSIS — B351 Tinea unguium: Secondary | ICD-10-CM

## 2016-05-07 DIAGNOSIS — M79676 Pain in unspecified toe(s): Secondary | ICD-10-CM

## 2016-05-07 NOTE — Progress Notes (Signed)
Patient ID: NARAYAN WAKELAND, male   DOB: [birth year], 82 y.o.   MRN: [id number] Complaint:  Visit Type: Patient returns to my office for continued preventative foot care services. Complaint: Patient states" my nails have grown long and thick and become painful to walk and wear shoes" . The patient presents for preventative foot care services. No changes to ROS  Podiatric Exam: Vascular: dorsalis pedis and posterior tibial pulses are not  palpable bilateral. Capillary return is immediate. Temperature gradient is WNL. Skin turgor WNL  Sensorium: Normal Semmes Weinstein monofilament test. Normal tactile sensation bilaterally. Nail Exam: Pt has thick disfigured discolored nails with subungual debris noted bilateral entire nail hallux through fifth toenails Ulcer Exam: There is no evidence of ulcer or pre-ulcerative changes or infection. Orthopedic Exam: Muscle tone and strength are WNL. No limitations in general ROM. No crepitus or effusions noted. Foot type and digits show no abnormalities. Bony prominences are unremarkable. Skin: No Porokeratosis. No infection or ulcers  Diagnosis:  Onychomycosis, , Pain in right toe, pain in left toes  Treatment & Plan Procedures and Treatment: Consent by patient was obtained for treatment procedures. The patient understood the discussion of treatment and procedures well. All questions were answered thoroughly reviewed. Debridement of mycotic and hypertrophic toenails, 1 through 5 bilateral and clearing of subungual debris. No ulceration, no infection noted.  Return Visit-Office Procedure: Patient instructed to return to the office for a follow up visit 3 months for continued evaluation and treatment.   Gardiner Barefoot DPM

## 2016-05-15 ENCOUNTER — Encounter: Payer: Self-pay | Admitting: Cardiology

## 2016-06-04 ENCOUNTER — Other Ambulatory Visit: Payer: Self-pay | Admitting: Internal Medicine

## 2016-07-07 DIAGNOSIS — C61 Malignant neoplasm of prostate: Secondary | ICD-10-CM | POA: Diagnosis not present

## 2016-07-10 ENCOUNTER — Encounter: Payer: Self-pay | Admitting: Student

## 2016-07-10 ENCOUNTER — Other Ambulatory Visit: Payer: Self-pay | Admitting: Internal Medicine

## 2016-07-10 DIAGNOSIS — C61 Malignant neoplasm of prostate: Secondary | ICD-10-CM | POA: Diagnosis not present

## 2016-07-17 ENCOUNTER — Ambulatory Visit (INDEPENDENT_AMBULATORY_CARE_PROVIDER_SITE_OTHER): Payer: Medicare Other | Admitting: *Deleted

## 2016-07-17 ENCOUNTER — Telehealth: Payer: Self-pay | Admitting: Cardiology

## 2016-07-17 DIAGNOSIS — R001 Bradycardia, unspecified: Secondary | ICD-10-CM

## 2016-07-17 DIAGNOSIS — Z950 Presence of cardiac pacemaker: Secondary | ICD-10-CM

## 2016-07-17 NOTE — Telephone Encounter (Signed)
Confirmed remote transmission w/ pt wife.   

## 2016-07-17 NOTE — Progress Notes (Signed)
Remote pacemaker transmission.   

## 2016-07-21 ENCOUNTER — Encounter: Payer: Self-pay | Admitting: Cardiology

## 2016-07-29 LAB — CUP PACEART REMOTE DEVICE CHECK
Battery Impedance: 245 Ohm
Brady Statistic AP VP Percent: 7 %
Brady Statistic AP VS Percent: 11 %
Brady Statistic AS VP Percent: 3 %
Brady Statistic AS VS Percent: 79 %
Date Time Interrogation Session: 20170907152524
Implantable Lead Location: 753859
Implantable Lead Location: 753860
Implantable Lead Model: 5076
Lead Channel Pacing Threshold Amplitude: 1.125 V
Lead Channel Pacing Threshold Pulse Width: 0.4 ms
Lead Channel Sensing Intrinsic Amplitude: 0.7 mV
Lead Channel Sensing Intrinsic Amplitude: 5.6 mV
Lead Channel Setting Pacing Amplitude: 2 V
Lead Channel Setting Pacing Pulse Width: 0.4 ms
Lead Channel Setting Sensing Sensitivity: 4 mV
MDC IDC LEAD IMPLANT DT: 20041129
MDC IDC LEAD IMPLANT DT: 20041129
MDC IDC MSMT BATTERY REMAINING LONGEVITY: 111 mo
MDC IDC MSMT BATTERY VOLTAGE: 2.79 V
MDC IDC MSMT LEADCHNL RA IMPEDANCE VALUE: 465 Ohm
MDC IDC MSMT LEADCHNL RA PACING THRESHOLD AMPLITUDE: 0.875 V
MDC IDC MSMT LEADCHNL RV IMPEDANCE VALUE: 588 Ohm
MDC IDC MSMT LEADCHNL RV PACING THRESHOLD PULSEWIDTH: 0.4 ms
MDC IDC SET LEADCHNL RV PACING AMPLITUDE: 2.5 V

## 2016-07-30 ENCOUNTER — Ambulatory Visit (INDEPENDENT_AMBULATORY_CARE_PROVIDER_SITE_OTHER): Payer: Medicare Other | Admitting: Podiatry

## 2016-07-30 ENCOUNTER — Encounter: Payer: Self-pay | Admitting: Podiatry

## 2016-07-30 VITALS — BP 103/63 | HR 85 | Resp 16

## 2016-07-30 DIAGNOSIS — M79673 Pain in unspecified foot: Secondary | ICD-10-CM

## 2016-07-30 DIAGNOSIS — B351 Tinea unguium: Secondary | ICD-10-CM | POA: Diagnosis not present

## 2016-07-30 DIAGNOSIS — M79676 Pain in unspecified toe(s): Secondary | ICD-10-CM

## 2016-07-30 NOTE — Progress Notes (Signed)
Patient ID: [name], male   DOB: 03/29/1917, 82 y.o.   MRN: [id number] Complaint:  Visit Type: Patient returns to my office for continued preventative foot care services. Complaint: Patient states" my nails have grown long and thick and become painful to walk and wear shoes" . The patient presents for preventative foot care services. No changes to ROS  Podiatric Exam: Vascular: dorsalis pedis and posterior tibial pulses are not  palpable bilateral. Capillary return is immediate. Temperature gradient is WNL. Skin turgor WNL  Sensorium: Normal Semmes Weinstein monofilament test. Normal tactile sensation bilaterally. Nail Exam: Pt has thick disfigured discolored nails with subungual debris noted bilateral entire nail hallux through fifth toenails Ulcer Exam: There is no evidence of ulcer or pre-ulcerative changes or infection. Orthopedic Exam: Muscle tone and strength are WNL. No limitations in general ROM. No crepitus or effusions noted. Foot type and digits show no abnormalities. Bony prominences are unremarkable. Skin: No Porokeratosis. No infection or ulcers  Diagnosis:  Onychomycosis, , Pain in right toe, pain in left toes  Treatment & Plan Procedures and Treatment: Consent by patient was obtained for treatment procedures. The patient understood the discussion of treatment and procedures well. All questions were answered thoroughly reviewed. Debridement of mycotic and hypertrophic toenails, 1 through 5 bilateral and clearing of subungual debris. No ulceration, no infection noted.  Return Visit-Office Procedure: Patient instructed to return to the office for a follow up visit 3 months for continued evaluation and treatment.   Alvin Rubano DPM 

## 2016-08-04 ENCOUNTER — Encounter: Payer: Self-pay | Admitting: Cardiology

## 2016-08-12 ENCOUNTER — Telehealth: Payer: Self-pay | Admitting: Internal Medicine

## 2016-08-12 DIAGNOSIS — I480 Paroxysmal atrial fibrillation: Secondary | ICD-10-CM

## 2016-08-12 NOTE — Telephone Encounter (Signed)
New message    [name] pt daughter verbalized that the [facility] wants [facility]Taylor to adjust the dosage of the medications Eliquis. She stated that it is very expensive, please call.   [place] doctor sent pt a letter that sates that a higher dosage is required.

## 2016-08-12 NOTE — Telephone Encounter (Signed)
Spoke with patients wife and she could not hear me.  She asked I call back after 12 when the daughter will be home

## 2016-08-15 NOTE — Telephone Encounter (Signed)
Follow up ° ° °Pt verbalized that she is returning call for rn °

## 2016-08-15 NOTE — Telephone Encounter (Addendum)
Will fax to Dr Delice Lesch [phone] Discussed with Dr Lovena Le and will increase his Eliquis to 5 mg twice daily New RX faxed to [facility]  # for information is [phone]

## 2016-08-20 MED ORDER — APIXABAN 5 MG PO TABS: 5.0000 mg | ORAL_TABLET | Freq: Two times a day (BID) | ORAL | 3 refills | Status: DC

## 2016-08-25 DIAGNOSIS — Z23 Encounter for immunization: Secondary | ICD-10-CM | POA: Diagnosis not present

## 2016-10-16 ENCOUNTER — Ambulatory Visit (INDEPENDENT_AMBULATORY_CARE_PROVIDER_SITE_OTHER): Payer: Medicare Other | Admitting: *Deleted

## 2016-10-16 ENCOUNTER — Telehealth: Payer: Self-pay | Admitting: Cardiology

## 2016-10-16 DIAGNOSIS — R001 Bradycardia, unspecified: Secondary | ICD-10-CM

## 2016-10-16 NOTE — Telephone Encounter (Signed)
Confirmed remote transmission w/ pt family member.   

## 2016-10-17 NOTE — Progress Notes (Signed)
Remote pacemaker transmission.   

## 2016-10-22 ENCOUNTER — Encounter: Payer: Self-pay | Admitting: Cardiology

## 2016-10-22 ENCOUNTER — Encounter: Payer: Self-pay | Admitting: Podiatry

## 2016-10-22 ENCOUNTER — Ambulatory Visit (INDEPENDENT_AMBULATORY_CARE_PROVIDER_SITE_OTHER): Payer: Self-pay | Admitting: Podiatry

## 2016-10-22 VITALS — Ht 66.25 in | Wt 180.0 lb

## 2016-10-22 DIAGNOSIS — B351 Tinea unguium: Secondary | ICD-10-CM

## 2016-10-22 DIAGNOSIS — M79676 Pain in unspecified toe(s): Secondary | ICD-10-CM

## 2016-10-22 NOTE — Progress Notes (Signed)
Patient ID: [name], male   DOB: 06/24/1917, 82 y.o.   MRN: [id number] Complaint:  Visit Type: Patient returns to my office for continued preventative foot care services. Complaint: Patient states" my nails have grown long and thick and become painful to walk and wear shoes" . The patient presents for preventative foot care services. No changes to ROS  Podiatric Exam: Vascular: dorsalis pedis and posterior tibial pulses are not  palpable bilateral. Capillary return is immediate. Temperature gradient is WNL. Skin turgor WNL  Sensorium: Normal Semmes Weinstein monofilament test. Normal tactile sensation bilaterally. Nail Exam: Pt has thick disfigured discolored nails with subungual debris noted bilateral entire nail hallux through fifth toenails Ulcer Exam: There is no evidence of ulcer or pre-ulcerative changes or infection. Orthopedic Exam: Muscle tone and strength are WNL. No limitations in general ROM. No crepitus or effusions noted. Foot type and digits show no abnormalities. Bony prominences are unremarkable. Skin: No Porokeratosis. No infection or ulcers  Diagnosis:  Onychomycosis, , Pain in right toe, pain in left toes  Treatment & Plan Procedures and Treatment: Consent by patient was obtained for treatment procedures. The patient understood the discussion of treatment and procedures well. All questions were answered thoroughly reviewed. Debridement of mycotic and hypertrophic toenails, 1 through 5 bilateral and clearing of subungual debris. No ulceration, no infection noted.  Return Visit-Office Procedure: Patient instructed to return to the office for a follow up visit 3 months for continued evaluation and treatment.   Jovanni Rash DPM 

## 2016-11-05 ENCOUNTER — Encounter: Payer: Self-pay | Admitting: Cardiology

## 2016-11-07 DIAGNOSIS — C61 Malignant neoplasm of prostate: Secondary | ICD-10-CM | POA: Diagnosis not present

## 2016-11-12 LAB — CUP PACEART REMOTE DEVICE CHECK
Battery Remaining Longevity: 105 mo
Brady Statistic AP VP Percent: 7 %
Brady Statistic AP VS Percent: 11 %
Brady Statistic AS VS Percent: 80 %
Implantable Lead Implant Date: 20041129
Implantable Lead Implant Date: 20041129
Implantable Lead Model: 5076
Lead Channel Impedance Value: 479 Ohm
Lead Channel Impedance Value: 586 Ohm
Lead Channel Pacing Threshold Amplitude: 0.875 V
Lead Channel Pacing Threshold Amplitude: 0.875 V
Lead Channel Pacing Threshold Pulse Width: 0.4 ms
Lead Channel Setting Pacing Amplitude: 2 V
Lead Channel Setting Pacing Amplitude: 2.5 V
Lead Channel Setting Pacing Pulse Width: 0.4 ms
Lead Channel Setting Sensing Sensitivity: 2.8 mV
MDC IDC LEAD LOCATION: 753859
MDC IDC LEAD LOCATION: 753860
MDC IDC MSMT BATTERY IMPEDANCE: 270 Ohm
MDC IDC MSMT BATTERY VOLTAGE: 2.79 V
MDC IDC MSMT LEADCHNL RV PACING THRESHOLD PULSEWIDTH: 0.4 ms
MDC IDC PG IMPLANT DT: 20140623
MDC IDC SESS DTM: 20171208163305
MDC IDC STAT BRADY AS VP PERCENT: 3 %

## 2016-11-16 ENCOUNTER — Inpatient Hospital Stay (HOSPITAL_COMMUNITY)
Admission: EM | Admit: 2016-11-16 | Discharge: 2016-11-20 | DRG: 195 | Disposition: A | Discharge status: Skilled Nursing Facility | Payer: Medicare Other | Attending: Internal Medicine | Admitting: Internal Medicine

## 2016-11-16 ENCOUNTER — Emergency Department (HOSPITAL_COMMUNITY): Payer: Medicare Other

## 2016-11-16 ENCOUNTER — Inpatient Hospital Stay (HOSPITAL_COMMUNITY): Payer: Medicare Other

## 2016-11-16 ENCOUNTER — Encounter (HOSPITAL_COMMUNITY): Payer: Self-pay

## 2016-11-16 DIAGNOSIS — E785 Hyperlipidemia, unspecified: Secondary | ICD-10-CM | POA: Diagnosis present

## 2016-11-16 DIAGNOSIS — R488 Other symbolic dysfunctions: Secondary | ICD-10-CM | POA: Diagnosis not present

## 2016-11-16 DIAGNOSIS — T148XXA Other injury of unspecified body region, initial encounter: Secondary | ICD-10-CM | POA: Diagnosis present

## 2016-11-16 DIAGNOSIS — R509 Fever, unspecified: Secondary | ICD-10-CM | POA: Diagnosis not present

## 2016-11-16 DIAGNOSIS — Z79899 Other long term (current) drug therapy: Secondary | ICD-10-CM

## 2016-11-16 DIAGNOSIS — R296 Repeated falls: Secondary | ICD-10-CM | POA: Diagnosis present

## 2016-11-16 DIAGNOSIS — R413 Other amnesia: Secondary | ICD-10-CM | POA: Diagnosis present

## 2016-11-16 DIAGNOSIS — D696 Thrombocytopenia, unspecified: Secondary | ICD-10-CM | POA: Diagnosis present

## 2016-11-16 DIAGNOSIS — I447 Left bundle-branch block, unspecified: Secondary | ICD-10-CM | POA: Diagnosis present

## 2016-11-16 DIAGNOSIS — Z7901 Long term (current) use of anticoagulants: Secondary | ICD-10-CM | POA: Diagnosis not present

## 2016-11-16 DIAGNOSIS — I482 Chronic atrial fibrillation: Secondary | ICD-10-CM | POA: Diagnosis present

## 2016-11-16 DIAGNOSIS — S4992XA Unspecified injury of left shoulder and upper arm, initial encounter: Secondary | ICD-10-CM | POA: Diagnosis not present

## 2016-11-16 DIAGNOSIS — Z87891 Personal history of nicotine dependence: Secondary | ICD-10-CM

## 2016-11-16 DIAGNOSIS — S5001XA Contusion of right elbow, initial encounter: Secondary | ICD-10-CM | POA: Diagnosis not present

## 2016-11-16 DIAGNOSIS — J09X1 Influenza due to identified novel influenza A virus with pneumonia: Secondary | ICD-10-CM | POA: Diagnosis not present

## 2016-11-16 DIAGNOSIS — M25512 Pain in left shoulder: Secondary | ICD-10-CM | POA: Diagnosis present

## 2016-11-16 DIAGNOSIS — E860 Dehydration: Secondary | ICD-10-CM | POA: Diagnosis present

## 2016-11-16 DIAGNOSIS — F0390 Unspecified dementia without behavioral disturbance: Secondary | ICD-10-CM | POA: Diagnosis present

## 2016-11-16 DIAGNOSIS — S8001XA Contusion of right knee, initial encounter: Secondary | ICD-10-CM | POA: Diagnosis not present

## 2016-11-16 DIAGNOSIS — Z950 Presence of cardiac pacemaker: Secondary | ICD-10-CM

## 2016-11-16 DIAGNOSIS — R531 Weakness: Secondary | ICD-10-CM | POA: Diagnosis not present

## 2016-11-16 DIAGNOSIS — E876 Hypokalemia: Secondary | ICD-10-CM | POA: Diagnosis present

## 2016-11-16 DIAGNOSIS — Z66 Do not resuscitate: Secondary | ICD-10-CM | POA: Diagnosis present

## 2016-11-16 DIAGNOSIS — J1108 Influenza due to unidentified influenza virus with specified pneumonia: Secondary | ICD-10-CM | POA: Diagnosis not present

## 2016-11-16 DIAGNOSIS — I10 Essential (primary) hypertension: Secondary | ICD-10-CM | POA: Diagnosis present

## 2016-11-16 DIAGNOSIS — K449 Diaphragmatic hernia without obstruction or gangrene: Secondary | ICD-10-CM | POA: Diagnosis present

## 2016-11-16 DIAGNOSIS — W19XXXA Unspecified fall, initial encounter: Secondary | ICD-10-CM | POA: Diagnosis present

## 2016-11-16 DIAGNOSIS — R278 Other lack of coordination: Secondary | ICD-10-CM | POA: Diagnosis not present

## 2016-11-16 DIAGNOSIS — Z23 Encounter for immunization: Secondary | ICD-10-CM

## 2016-11-16 DIAGNOSIS — Z951 Presence of aortocoronary bypass graft: Secondary | ICD-10-CM | POA: Diagnosis not present

## 2016-11-16 DIAGNOSIS — K219 Gastro-esophageal reflux disease without esophagitis: Secondary | ICD-10-CM | POA: Diagnosis present

## 2016-11-16 DIAGNOSIS — S299XXA Unspecified injury of thorax, initial encounter: Secondary | ICD-10-CM | POA: Diagnosis not present

## 2016-11-16 DIAGNOSIS — R404 Transient alteration of awareness: Secondary | ICD-10-CM | POA: Diagnosis not present

## 2016-11-16 DIAGNOSIS — R2689 Other abnormalities of gait and mobility: Secondary | ICD-10-CM | POA: Diagnosis not present

## 2016-11-16 DIAGNOSIS — R918 Other nonspecific abnormal finding of lung field: Secondary | ICD-10-CM | POA: Diagnosis not present

## 2016-11-16 DIAGNOSIS — M6281 Muscle weakness (generalized): Secondary | ICD-10-CM | POA: Diagnosis not present

## 2016-11-16 DIAGNOSIS — J129 Viral pneumonia, unspecified: Secondary | ICD-10-CM | POA: Diagnosis present

## 2016-11-16 DIAGNOSIS — R0600 Dyspnea, unspecified: Secondary | ICD-10-CM

## 2016-11-16 DIAGNOSIS — R1319 Other dysphagia: Secondary | ICD-10-CM | POA: Diagnosis not present

## 2016-11-16 LAB — URINALYSIS, ROUTINE W REFLEX MICROSCOPIC
BACTERIA UA: NONE SEEN
BILIRUBIN URINE: NEGATIVE
GLUCOSE, UA: NEGATIVE mg/dL
HGB URINE DIPSTICK: NEGATIVE
Ketones, ur: NEGATIVE mg/dL
LEUKOCYTES UA: NEGATIVE
NITRITE: NEGATIVE
Protein, ur: 30 mg/dL — AB
SPECIFIC GRAVITY, URINE: 1.023 (ref 1.005–1.030)
pH: 5 (ref 5.0–8.0)

## 2016-11-16 LAB — CBC WITH DIFFERENTIAL/PLATELET
BASOS ABS: 0 10*3/uL (ref 0.0–0.1)
BASOS PCT: 0 %
EOS ABS: 0 10*3/uL (ref 0.0–0.7)
Eosinophils Relative: 0 %
HEMATOCRIT: 42.5 % (ref 39.0–52.0)
Hemoglobin: 14.2 g/dL (ref 13.0–17.0)
Lymphocytes Relative: 10 %
Lymphs Abs: 0.8 10*3/uL (ref 0.7–4.0)
MCH: 30.3 pg (ref 26.0–34.0)
MCHC: 33.4 g/dL (ref 30.0–36.0)
MCV: 90.8 fL (ref 78.0–100.0)
MONO ABS: 0.3 10*3/uL (ref 0.1–1.0)
Monocytes Relative: 4 %
NEUTROS ABS: 6.8 10*3/uL (ref 1.7–7.7)
NEUTROS PCT: 86 %
Platelets: 161 10*3/uL (ref 150–400)
RBC: 4.68 MIL/uL (ref 4.22–5.81)
RDW: 15.4 % (ref 11.5–15.5)
WBC: 7.9 10*3/uL (ref 4.0–10.5)

## 2016-11-16 LAB — COMPREHENSIVE METABOLIC PANEL
ALK PHOS: 108 U/L (ref 38–126)
ALT: 37 U/L (ref 17–63)
ANION GAP: 7 (ref 5–15)
AST: 48 U/L — ABNORMAL HIGH (ref 15–41)
Albumin: 3.7 g/dL (ref 3.5–5.0)
BILIRUBIN TOTAL: 1 mg/dL (ref 0.3–1.2)
BUN: 15 mg/dL (ref 6–20)
CALCIUM: 9.2 mg/dL (ref 8.9–10.3)
CO2: 25 mmol/L (ref 22–32)
CREATININE: 1.19 mg/dL (ref 0.61–1.24)
Chloride: 107 mmol/L (ref 101–111)
GFR calc non Af Amer: 49 mL/min — ABNORMAL LOW (ref >60)
GFR, EST AFRICAN AMERICAN: 57 mL/min — AB (ref >60)
GLUCOSE: 139 mg/dL — AB (ref 65–99)
Potassium: 4.2 mmol/L (ref 3.5–5.1)
Sodium: 139 mmol/L (ref 135–145)
TOTAL PROTEIN: 6.4 g/dL — AB (ref 6.5–8.1)

## 2016-11-16 LAB — I-STAT CG4 LACTIC ACID, ED
LACTIC ACID, VENOUS: 1.59 mmol/L (ref 0.5–1.9)
Lactic Acid, Venous: 2.5 mmol/L (ref 0.5–1.9)

## 2016-11-16 LAB — INFLUENZA PANEL BY PCR (TYPE A & B)
Influenza A By PCR: POSITIVE — AB
Influenza B By PCR: NEGATIVE

## 2016-11-16 LAB — TSH: TSH: 1.492 u[IU]/mL (ref 0.350–4.500)

## 2016-11-16 LAB — POC OCCULT BLOOD, ED: FECAL OCCULT BLD: NEGATIVE

## 2016-11-16 LAB — LACTIC ACID, PLASMA
LACTIC ACID, VENOUS: 2.5 mmol/L — AB (ref 0.5–1.9)
Lactic Acid, Venous: 2.3 mmol/L (ref 0.5–1.9)

## 2016-11-16 LAB — PROCALCITONIN

## 2016-11-16 MED ORDER — VANCOMYCIN HCL 10 G IV SOLR
1500.0000 mg | Freq: Once | INTRAVENOUS | Status: AC
  Administered 2016-11-16: 1500 mg via INTRAVENOUS
  Filled 2016-11-16: qty 1500

## 2016-11-16 MED ORDER — APIXABAN 5 MG PO TABS
5.0000 mg | ORAL_TABLET | Freq: Two times a day (BID) | ORAL | Status: DC
  Administered 2016-11-16 – 2016-11-20 (×9): 5 mg via ORAL
  Filled 2016-11-16 (×9): qty 1

## 2016-11-16 MED ORDER — SODIUM CHLORIDE 0.9% FLUSH
3.0000 mL | Freq: Two times a day (BID) | INTRAVENOUS | Status: DC
Start: 2016-11-16 — End: 2016-11-21
  Administered 2016-11-16 – 2016-11-20 (×8): 3 mL via INTRAVENOUS

## 2016-11-16 MED ORDER — VITAMIN C 500 MG PO TABS
500.0000 mg | ORAL_TABLET | Freq: Every day | ORAL | Status: DC
  Administered 2016-11-16 – 2016-11-20 (×5): 500 mg via ORAL
  Filled 2016-11-16 (×5): qty 1

## 2016-11-16 MED ORDER — ONDANSETRON HCL 4 MG PO TABS
4.0000 mg | ORAL_TABLET | Freq: Four times a day (QID) | ORAL | Status: DC | PRN
Start: 2016-11-16 — End: 2016-11-21

## 2016-11-16 MED ORDER — ONDANSETRON HCL 4 MG/2ML IJ SOLN: 4.0000 mg | Freq: Four times a day (QID) | INTRAMUSCULAR | Status: DC | PRN

## 2016-11-16 MED ORDER — SODIUM CHLORIDE 0.9 % IV SOLN
INTRAVENOUS | Status: DC
  Administered 2016-11-16: 19:00:00 via INTRAVENOUS

## 2016-11-16 MED ORDER — LUTEIN-ZEAXANTHIN 25-5 MG PO CAPS: 1.0000 | ORAL_CAPSULE | Freq: Every day | ORAL | Status: DC

## 2016-11-16 MED ORDER — TETANUS-DIPHTH-ACELL PERTUSSIS 5-2.5-18.5 LF-MCG/0.5 IM SUSP
0.5000 mL | Freq: Once | INTRAMUSCULAR | Status: AC
  Administered 2016-11-16: 0.5 mL via INTRAMUSCULAR
  Filled 2016-11-16: qty 0.5

## 2016-11-16 MED ORDER — PIPERACILLIN-TAZOBACTAM 3.375 G IVPB 30 MIN
3.3750 g | Freq: Once | INTRAVENOUS | Status: AC
  Administered 2016-11-16: 3.375 g via INTRAVENOUS
  Filled 2016-11-16: qty 50

## 2016-11-16 MED ORDER — DEXTROSE 5 % IV SOLN
500.0000 mg | INTRAVENOUS | Status: DC
  Administered 2016-11-16 – 2016-11-19 (×4): 500 mg via INTRAVENOUS
  Filled 2016-11-16 (×5): qty 500

## 2016-11-16 MED ORDER — VANCOMYCIN HCL 10 G IV SOLR: 1250.0000 mg | INTRAVENOUS | Status: DC

## 2016-11-16 MED ORDER — GLUCOSAMINE-CHONDROITIN 500-400 MG PO TABS: 1.0000 | ORAL_TABLET | Freq: Two times a day (BID) | ORAL | Status: DC

## 2016-11-16 MED ORDER — ACETAMINOPHEN 325 MG PO TABS
650.0000 mg | ORAL_TABLET | Freq: Four times a day (QID) | ORAL | Status: DC | PRN
  Administered 2016-11-16: 650 mg via ORAL
  Filled 2016-11-16: qty 2

## 2016-11-16 MED ORDER — PIPERACILLIN-TAZOBACTAM 3.375 G IVPB
3.3750 g | Freq: Three times a day (TID) | INTRAVENOUS | Status: DC
  Filled 2016-11-16 (×2): qty 50

## 2016-11-16 MED ORDER — SODIUM CHLORIDE 0.9 % IV BOLUS (SEPSIS)
500.0000 mL | Freq: Once | INTRAVENOUS | Status: AC
  Administered 2016-11-16: 500 mL via INTRAVENOUS

## 2016-11-16 MED ORDER — FAMOTIDINE 20 MG PO TABS
20.0000 mg | ORAL_TABLET | Freq: Every day | ORAL | Status: DC
  Administered 2016-11-16 – 2016-11-20 (×5): 20 mg via ORAL
  Filled 2016-11-16 (×5): qty 1

## 2016-11-16 MED ORDER — ACETAMINOPHEN 325 MG PO TABS
650.0000 mg | ORAL_TABLET | Freq: Once | ORAL | Status: AC
  Administered 2016-11-16: 650 mg via ORAL
  Filled 2016-11-16: qty 2

## 2016-11-16 MED ORDER — DEXTROSE 5 % IV SOLN
1.0000 g | INTRAVENOUS | Status: DC
  Administered 2016-11-16 – 2016-11-19 (×4): 1 g via INTRAVENOUS
  Filled 2016-11-16 (×5): qty 10

## 2016-11-16 MED ORDER — PRAVASTATIN SODIUM 20 MG PO TABS
10.0000 mg | ORAL_TABLET | Freq: Every evening | ORAL | Status: DC
  Administered 2016-11-16 – 2016-11-20 (×5): 10 mg via ORAL
  Filled 2016-11-16 (×6): qty 1

## 2016-11-16 MED ORDER — VANCOMYCIN HCL IN DEXTROSE 1-5 GM/200ML-% IV SOLN: 1000.0000 mg | Freq: Once | INTRAVENOUS | Status: DC

## 2016-11-16 NOTE — ED Triage Notes (Signed)
Pt BIB [facility] from home where pt lives with daughter. Pt daughter reports to EMS that pt has had 3 falls in the last 24 hours. Also reporting the pt urine has seemed darker than normal. EMS went out for a fall last night but did not transport. Daughter denies head trauma however EMS reports lac to left elbow, hematoma to left knee and pt c/o pain in left shoulder. Upon EKG EMS noticed pt going back and forth between afib and a paced rhythm. Pt does have hx. Of short term memory loss but was oriented with EMS. Pt is on blood thinner.

## 2016-11-16 NOTE — H&P (Signed)
History and Physical    [name] [id number] DOB: [birth year] DOA: 11/16/2016  Referring MD/NP/PA: Dr. Claiborne Rigg  PCP: Hoyt Koch, MD   Patient coming from: Home  Chief Complaint: weakness, falls   HPI: [name] is a 83 y.o. male with medical history significant of HTN, HLD, atrial fib on Eliquis , dementia, presented to [facility] ED for evaluation of several days duration of progressive weakness, several falls in the past 24 hours. Please note that pt looks somnolent at the time of the admission and daughter at bedside provides most of the information. Pt's daughter reports that pt has had fevers at home some cough but mostly unproductive, has not been eating much and has fallen three times today. No reports of chest pain, no reports of unilateral weakness or dizziness, no LOC.   ED Course: Pt hemodynamically stable, VS notable for oxygen saturation 87% on RA, RR 29 bpm. CXR with no acute findings. Lactic acid > 2. TRH asked to admit for further evaluation.   Review of Systems:  Constitutional: Negative for diaphoresis HENT: Negative for ear pain, nosebleeds, congestion, facial swelling, rhinorrhea, neck pain Eyes: Negative for pain, discharge, redness, itching and visual disturbance.  Respiratory: Negative for wheezing and stridor.   Cardiovascular: Negative for chest pain, palpitations and leg swelling.  Gastrointestinal: Negative for abdominal distention.  Genitourinary: Negative for dysuria, urgency, frequency, hematuria Musculoskeletal: Negative for back pain, joint swelling, arthralgias Neurological: Negative for dizziness, tremors, seizures, syncope, facial asymmetry, speech difficulty Hematological: Negative for adenopathy. Does not bruise/bleed easily.  Psychiatric/Behavioral: Negative for hallucinations, behavioral problems  Past Medical History:  Diagnosis Date  . Adenocarcinoma of prostate ([place]) 10/2005   lupron injections q65mo  . Arthritis    ?rheumatoid, never dx or on DMARDs  . Atrial fibrillation (HCC)    chronic anticoag  . Barrett's esophagus   . CAD (coronary artery disease)    CABG 08/1973  . CHF (congestive heart failure) ([place])    ICM  . Chronic renal insufficiency   . Diverticulosis of colon   . GERD (gastroesophageal reflux disease)   . Hyperlipidemia   . Hypertension   . Memory loss   . Osteoporosis   . Pacemaker 09/2003   RESULTS:  This demonstrates successful implantation of a Medtronic dual  . Paroxysmal atrial fibrillation ([place])   . Unsteady gait    due to B ankle DJD and instability    Past Surgical History:  Procedure Laterality Date  . CORONARY ARTERY BYPASS GRAFT  08/1973   3 heart heart arteries were 80 percent clogged. the lower valve of my heart is not functioning  . DOPPLER ECHOCARDIOGRAPHY  2004, 2008  . HERNIA REPAIR     age 6 ( not sure if it was right or left side)  . PACEMAKER GENERATOR CHANGE N/A 05/02/2013   Procedure: PACEMAKER GENERATOR CHANGE;  Surgeon: Evans Lance, MD;  Location: [facility] CATH LAB;  Service: Cardiovascular;  Laterality: N/A;  . PACEMAKER PLACEMENT  09/2003   Social Hx:  reports that he quit smoking about 30 years ago. He does not have any smokeless tobacco history on file. He reports that he drinks alcohol. His drug history is not on file.  No Known Allergies  Family History  Problem Relation Age of Onset  . Cancer Mother   . Heart attack Father     Prior to Admission medications   Medication Sig Start Date End Date Taking? Authorizing Provider  acetaminophen (TYLENOL) 325 MG tablet Take  650 mg by mouth every 6 (six) hours as needed for mild pain.   Yes Historical Provider, MD  apixaban (ELIQUIS) 5 MG TABS tablet Take 1 tablet (5 mg total) by mouth 2 (two) times daily. 08/20/16  Yes Evans Lance, MD  furosemide (LASIX) 40 MG tablet TAKE 1/2 TABLET BY MOUTH DAILY 08/06/15  Yes Hoyt Koch, MD  pravastatin (PRAVACHOL) 10 MG tablet TAKE 1 TABLET (10 MG  TOTAL) BY MOUTH EVERY EVENING. 07/10/16  Yes Hoyt Koch, MD  ranitidine (ZANTAC) 150 MG tablet TAKE 1 TABLET (150 MG TOTAL) BY MOUTH AT BEDTIME. 12/24/15  Yes Hoyt Koch, MD    Physical Exam: Vitals:   11/16/16 1345 11/16/16 1430 11/16/16 1515 11/16/16 1530  BP: 113/75 106/66 101/64 101/56  Pulse: 76 75 82 65  Resp: (!) 27 14 16  (!) 29  Temp:      TempSrc:      SpO2: 93% 93% 93% (!) 87%  Weight:      Height:        Constitutional: NAD, sleepy Vitals:   11/16/16 1345 11/16/16 1430 11/16/16 1515 11/16/16 1530  BP: 113/75 106/66 101/64 101/56  Pulse: 76 75 82 65  Resp: (!) 27 14 16  (!) 29  Temp:      TempSrc:      SpO2: 93% 93% 93% (!) 87%  Weight:      Height:       Eyes: PERRL, lids and conjunctivae normal ENMT: Mucous membranes are dry. Posterior pharynx clear of any exudate or lesions.Normal dentition.  Neck: normal, supple, no masses, no thyromegaly Respiratory: Diminished breath sounds at bases. No accessory muscle use.  Cardiovascular: Irregular rate and rhythm, no rubs / gallops. No extremity edema. 2+ pedal pulses.  Abdomen: no tenderness, no masses palpated. No hepatosplenomegaly. Bowel sounds positive.  Musculoskeletal: no clubbing / cyanosis. No joint deformity upper and lower extremities. Good ROM, no contractures. Normal muscle tone.  Skin: no rashes, lesions, ulcers. No induration Neurologic: Sensation intact, DTR normal. Strength difficult to assess as pt rather sleepy but able to move all 4 extremities spontaneously  Psychiatric: difficult to assess as pt tired and sleepy   Labs on Admission: I have personally reviewed following labs and imaging studies  CBC:  Recent Labs Lab 11/16/16 1359  WBC 7.9  NEUTROABS 6.8  HGB 14.2  HCT 42.5  MCV 90.8  PLT [id number]   Basic Metabolic Panel:  Recent Labs Lab 11/16/16 1359  NA 139  K 4.2  CL 107  CO2 25  GLUCOSE 139*  BUN 15  CREATININE 1.19  CALCIUM 9.2   Liver Function  Tests:  Recent Labs Lab 11/16/16 1359  AST 48*  ALT 37  ALKPHOS 108  BILITOT 1.0  PROT 6.4*  ALBUMIN 3.7   Urine analysis:    Component Value Date/Time   COLORURINE YELLOW 11/16/2016 [place] 11/16/2016 1459   LABSPEC 1.023 11/16/2016 1459   PHURINE 5.0 11/16/2016 1459   GLUCOSEU NEGATIVE 11/16/2016 1459   HGBUR NEGATIVE 11/16/2016 1459   BILIRUBINUR NEGATIVE 11/16/2016 1459   KETONESUR NEGATIVE 11/16/2016 1459   PROTEINUR 30 (A) 11/16/2016 1459   UROBILINOGEN 0.2 11/04/2014 1215   NITRITE NEGATIVE 11/16/2016 1459   LEUKOCYTESUR NEGATIVE 11/16/2016 1459   Radiological Exams on Admission: Dg Chest 2 View  Result Date: 11/16/2016 CLINICAL DATA:  Fall.  Painful left shoulder. EXAM: CHEST  2 VIEW COMPARISON:  January 04, 2015 FINDINGS: Stable cardiomegaly and pacemaker. The mediastinum  and hila are stable. The lungs are clear. Anterior wedging of a lower thoracic vertebral body is stable. IMPRESSION: No active cardiopulmonary disease. Electronically Signed   By: Dorise Bullion III M.D   On: 11/16/2016 16:11   Dg Shoulder Left  Result Date: 11/16/2016 CLINICAL DATA:  Pain after trauma EXAM: LEFT SHOULDER - 2+ VIEW COMPARISON:  None. FINDINGS: There is no evidence of fracture or dislocation. There is no evidence of arthropathy or other focal bone abnormality. Soft tissues are unremarkable. IMPRESSION: Negative. Electronically Signed   By: Dorise Bullion III M.D   On: 11/16/2016 16:12    EKG: pending   Assessment/Plan Active Problems: Weakness, falls - unclear etiology - daughter reported fevers > 101 but non recorded here - pt still with low oxygen saturations but does not appear to be in respiratory distress - will ask for CT chest for clearer evaluation - place on empiric ABX until we get results of CT and viral panel - resp viral panel requested - blood cultures also requested - lactic acid was elevated and will repeat lactic acid, also check  procalcitonin level - if these tests reveal potential infections source, can proceed with full sepsis - keep on IVF - telemetry bed appropriate  Atrial fib - continue Eliquis - keep on tele     DVT prophylaxis: Eliquis  Code Status: DNR Family Communication: Pt and daughter updated at bedside Disposition Plan: Admit to telemetry unit Consults called: None Admission status: Inpatient   Faye Ramsay MD [facility] Pager [phone]  If 7PM-7AM, please contact night-coverage [email or web address] Password [facility]  11/16/2016, 4:35 PM

## 2016-11-16 NOTE — Progress Notes (Addendum)
Pharmacy Antibiotic Note  [name] is a 83 y.o. male admitted on 11/16/2016 with sepsis.  Pharmacy has been consulted for vancomycin/Zosyn dosing. Pt weight is 81.6 kg, but no recent SCr to dose off of (most recent SCr 1.22 in 12/2014).  Plan: -Vancomycin 1.5g IV x1 -Zosyn 3.375g IV over 30 minutes x1 per MD -Zosyn 3.375g IV over 4 hours q8h -Follow-up renal function for maintenance dosing of vancomycin -Monitor renal function, Cx, S/Sx infection, VT as indicated  Height: 5\' 6"  (167.6 cm) Weight: 180 lb (81.6 kg) IBW/kg (Calculated) : 63.8  Temp (24hrs), Avg:98.2 F (36.8 C), Min:98.2 F (36.8 C), Max:98.2 F (36.8 C)  No results for input(s): WBC, CREATININE, LATICACIDVEN, VANCOTROUGH, VANCOPEAK, VANCORANDOM, GENTTROUGH, GENTPEAK, GENTRANDOM, TOBRATROUGH, TOBRAPEAK, TOBRARND, AMIKACINPEAK, AMIKACINTROU, AMIKACIN in the last 168 hours.  CrCl cannot be calculated (Patient's most recent lab result is older than the maximum 21 days allowed.).    No Known Allergies  Antimicrobials this admission: 1/7 Zosyn >>  1/7 Vancomycin >>   Dose adjustments this admission: n/a  Microbiology results: none  Thank you for allowing pharmacy to be a part of this patient's care.  Arrie Senate, PharmD PGY-1 Pharmacy Resident Pager: [phone] 11/16/2016   Addendum: Scr 1.19. Will start vancomycin 1250mg  IV Q24H tomorrow.  Salome Arnt, PharmD, BCPS Pager # [phone] 11/16/2016 3:50 PM

## 2016-11-16 NOTE — ED Provider Notes (Addendum)
[place] DEPT Provider Note   CSN: [id number] Arrival date & time: 11/16/16  1206   Level V caveat, history of chronic short-term memory loss  History   Chief Complaint Chief Complaint  Patient presents with  . Fall  . Pacemaker Problem    HPI [name] is a 83 y.o. male.  HPI history is obtained from patient's daughter. Patient reportedly with short-term memory loss patient has fallen 3 times in the past 24 hours when "his legs gave way while he was walking". Patient complains of left shoulder pain as result of one of his falls yesterday. Pain is worse with moving his shoulder and improved with remaining still. Other associated symptoms include that He's also had a cough for the past 24 hours. . He denies headache denies chest pain denies abdominal pain. No other associated symptoms. No treatment prior to coming here Past Medical History:  Diagnosis Date  . Adenocarcinoma of prostate ([place]) 10/2005   lupron injections q57mo  . Arthritis    ?rheumatoid, never dx or on DMARDs  . Atrial fibrillation (HCC)    chronic anticoag  . Barrett's esophagus   . CAD (coronary artery disease)    CABG 08/1973  . CHF (congestive heart failure) ([place])    ICM  . Chronic renal insufficiency   . Diverticulosis of colon   . GERD (gastroesophageal reflux disease)   . Hyperlipidemia   . Hypertension   . Memory loss   . Osteoporosis   . Pacemaker 09/2003   RESULTS:  This demonstrates successful implantation of a Medtronic dual  . Paroxysmal atrial fibrillation ([place])   . Unsteady gait    due to B ankle DJD and instability    Patient Active Problem List   Diagnosis Date Noted  . Back pain 08/30/2015  . Pain in joint, shoulder region 03/08/2015  . Dysphagia 03/08/2015  . Pacemaker 01/30/2015  . Aortic stenosis 01/30/2015  . TIA (transient ischemic attack) 11/05/2014  . Transaminitis 11/05/2014  . CVA (cerebral infarction) 11/04/2014  . SDH (subdural hematoma) ([place]) 03/23/2014    . Osteoarthritis of ankle   . Mitral valve disorder 01/07/2011  . OSTEOPOROSIS 08/13/2010  . DEGENERATIVE JOINT DISEASE, HANDS 06/07/2009  . Chronic kidney disease 04/04/2009  . UNSTEADY GAIT 12/19/2008  . ATRIAL FIBRILLATION, PAROXYSMAL 09/29/2008  . G E R D 12/02/2007  . BARRETTS ESOPHAGUS 12/02/2007  . ADENOCARCINOMA, PROSTATE, HX OF 11/16/2007  . Essential hypertension 10/11/2007  . Elevated lipids 05/20/2007  . Coronary atherosclerosis 05/20/2007  . Chronic combined systolic and diastolic congestive heart failure ([place]) 05/20/2007    Past Surgical History:  Procedure Laterality Date  . CORONARY ARTERY BYPASS GRAFT  08/1973   3 heart heart arteries were 80 percent clogged. the lower valve of my heart is not functioning  . DOPPLER ECHOCARDIOGRAPHY  2004, 2008  . HERNIA REPAIR     age 47 ( not sure if it was right or left side)  . PACEMAKER GENERATOR CHANGE N/A 05/02/2013   Procedure: PACEMAKER GENERATOR CHANGE;  Surgeon: Evans Lance, MD;  Location: [facility] CATH LAB;  Service: Cardiovascular;  Laterality: N/A;  . PACEMAKER PLACEMENT  09/2003       Home Medications    Prior to Admission medications   Medication Sig Start Date End Date Taking? Authorizing Provider  acetaminophen (TYLENOL) 325 MG tablet Take 650 mg by mouth every 6 (six) hours as needed for mild pain.   Yes Historical Provider, MD  apixaban (ELIQUIS) 5 MG TABS tablet  Take 1 tablet (5 mg total) by mouth 2 (two) times daily. 08/20/16  Yes Evans Lance, MD  furosemide (LASIX) 40 MG tablet TAKE 1/2 TABLET BY MOUTH DAILY 08/06/15  Yes Hoyt Koch, MD  glucosamine-chondroitin 500-400 MG tablet Take 1 tablet by mouth 2 (two) times daily.   Yes Historical Provider, MD  Lutein-Zeaxanthin 25-5 MG CAPS Take 1 capsule by mouth daily.   Yes Historical Provider, MD  nitroGLYCERIN (NITROSTAT) 0.4 MG SL tablet Place 1 tablet (0.4 mg total) under the tongue every 5 (five) minutes as needed. 02/21/15  Yes Josue Hector,  MD  potassium chloride (K-DUR) 10 MEQ tablet TAKE 1 TABLET (10 MEQ TOTAL) BY MOUTH DAILY. 09/12/15  Yes Hoyt Koch, MD  pravastatin (PRAVACHOL) 10 MG tablet TAKE 1 TABLET (10 MG TOTAL) BY MOUTH EVERY EVENING. 07/10/16  Yes Hoyt Koch, MD  ranitidine (ZANTAC) 150 MG tablet TAKE 1 TABLET (150 MG TOTAL) BY MOUTH AT BEDTIME. 12/24/15  Yes Hoyt Koch, MD  vitamin C (ASCORBIC ACID) 500 MG tablet Take 1 tablet (500 mg total) by mouth daily. 07/06/13  Yes Rowe Clack, MD    Family History Family History  Problem Relation Age of Onset  . Cancer Mother   . Heart attack Father     Social History Social History  Substance Use Topics  . Smoking status: Former Smoker    Quit date: 08/05/1986  . Smokeless tobacco: Not on file  . Alcohol use Yes     Comment: samll glass of wine     Allergies   Patient has no known allergies.   Review of Systems Review of Systems  Unable to perform ROS: Other  Respiratory: Positive for cough.   Musculoskeletal: Positive for arthralgias and gait problem.       Left shoulder pain  Hematological: Bruises/bleeds easily.   Unable to perform complete review of systems, chronic short-term memory loss  Physical Exam Updated Vital Signs BP 121/82   Pulse 80   Temp 98.2 F (36.8 C) (Oral)   Resp 26   Ht 5\' 6"  (1.676 m)   Wt 180 lb (81.6 kg)   SpO2 95%   BMI 29.05 kg/m   Physical Exam  Constitutional:  Chronically ill-appearing  HENT:  Head: Normocephalic and atraumatic.  Eyes: Conjunctivae are normal. Pupils are equal, round, and reactive to light.  Neck: Neck supple. No tracheal deviation present. No thyromegaly present.  Cardiovascular: Normal rate and regular rhythm.   No murmur heard. Pulmonary/Chest: Effort normal and breath sounds normal.  Abdominal: Soft. Bowel sounds are normal. He exhibits no distension. There is no tenderness.  Genitourinary: Rectum normal and penis normal. Rectal exam shows guaiac  negative stool.  Genitourinary Comments: Rectal normal tone brown stool, nontender. Normal male genitalia  Musculoskeletal: Normal range of motion. He exhibits no edema or tenderness.  Tire spine is nontender. Pelvis stable nontender. Left upper extremity dime-sized abrasion over posterior elbow. Shoulder is tender. No deformity. Radial pulse 2+. Right upper extremity 2 cm abrasion over posterior elbow otherwise without contusion abrasion or tenderness neurovascular intact. Right lower extremity with 2 cm hematoma over anterior knee. Without tenderness. Bilateral lower extremities with 1+ pretibial pitting edema.  Neurological: He is alert. Coordination normal.  Skin: Skin is warm and dry. No rash noted.  Psychiatric: He has a normal mood and affect.  Nursing note and vitals reviewed.    ED Treatments / Results  Labs (all labs ordered are listed, but only  abnormal results are displayed) Labs Reviewed - No data to display  EKG  EKG Interpretation  Date/Time:  Sunday November 16 2016 12:10:07 EST Ventricular Rate:  78 PR Interval:    QRS Duration: 125 QT Interval:  401 QTC Calculation: 457 R Axis:   -4 Text Interpretation:  Atrial fibrillation IVCD, consider atypical LBBB No significant change since last tracing Confirmed by Winfred Leeds  MD, Harriet Bollen [phone]) on 11/16/2016 12:21:15 PM       Radiology No results found. X-rays viewed by me Procedures Procedures (including critical care time)  Medications Ordered in ED Medications - No data to display  Results for orders placed or performed during the hospital encounter of 11/16/16  Comprehensive metabolic panel  Result Value Ref Range   Sodium 139 135 - 145 mmol/L   Potassium 4.2 3.5 - 5.1 mmol/L   Chloride 107 101 - 111 mmol/L   CO2 25 22 - 32 mmol/L   Glucose, Bld 139 (H) 65 - 99 mg/dL   BUN 15 6 - 20 mg/dL   Creatinine, Ser 1.19 0.61 - 1.24 mg/dL   Calcium 9.2 8.9 - 10.3 mg/dL   Total Protein 6.4 (L) 6.5 - 8.1 g/dL   Albumin  3.7 3.5 - 5.0 g/dL   AST 48 (H) 15 - 41 U/L   ALT 37 17 - 63 U/L   Alkaline Phosphatase 108 38 - 126 U/L   Total Bilirubin 1.0 0.3 - 1.2 mg/dL   GFR calc non Af Amer 49 (L) >60 mL/min   GFR calc Af Amer 57 (L) >60 mL/min   Anion gap 7 5 - 15  CBC WITH DIFFERENTIAL  Result Value Ref Range   WBC 7.9 4.0 - 10.5 K/uL   RBC 4.68 4.22 - 5.81 MIL/uL   Hemoglobin 14.2 13.0 - 17.0 g/dL   HCT 42.5 39.0 - 52.0 %   MCV 90.8 78.0 - 100.0 fL   MCH 30.3 26.0 - 34.0 pg   MCHC 33.4 30.0 - 36.0 g/dL   RDW 15.4 11.5 - 15.5 %   Platelets 161 150 - 400 K/uL   Neutrophils Relative % 86 %   Neutro Abs 6.8 1.7 - 7.7 K/uL   Lymphocytes Relative 10 %   Lymphs Abs 0.8 0.7 - 4.0 K/uL   Monocytes Relative 4 %   Monocytes Absolute 0.3 0.1 - 1.0 K/uL   Eosinophils Relative 0 %   Eosinophils Absolute 0.0 0.0 - 0.7 K/uL   Basophils Relative 0 %   Basophils Absolute 0.0 0.0 - 0.1 K/uL  Urinalysis, Routine w reflex microscopic  Result Value Ref Range   Color, Urine YELLOW YELLOW   APPearance CLEAR CLEAR   Specific Gravity, Urine 1.023 1.005 - 1.030   pH 5.0 5.0 - 8.0   Glucose, UA NEGATIVE NEGATIVE mg/dL   Hgb urine dipstick NEGATIVE NEGATIVE   Bilirubin Urine NEGATIVE NEGATIVE   Ketones, ur NEGATIVE NEGATIVE mg/dL   Protein, ur 30 (A) NEGATIVE mg/dL   Nitrite NEGATIVE NEGATIVE   Leukocytes, UA NEGATIVE NEGATIVE   RBC / HPF 0-5 0 - 5 RBC/hpf   WBC, UA 0-5 0 - 5 WBC/hpf   Bacteria, UA NONE SEEN NONE SEEN   Squamous Epithelial / LPF 0-5 (A) NONE SEEN   Mucous PRESENT   POC occult blood, ED  Result Value Ref Range   Fecal Occult Bld NEGATIVE NEGATIVE  I-Stat CG4 Lactic Acid, ED  (not at  [facility])  Result Value Ref Range   Lactic Acid, Venous 2.50 (  HH) 0.5 - 1.9 mmol/L   Comment NOTIFIED PHYSICIAN    Dg Chest 2 View  Result Date: 11/16/2016 CLINICAL DATA:  Fall.  Painful left shoulder. EXAM: CHEST  2 VIEW COMPARISON:  January 04, 2015 FINDINGS: Stable cardiomegaly and pacemaker. The mediastinum and  hila are stable. The lungs are clear. Anterior wedging of a lower thoracic vertebral body is stable. IMPRESSION: No active cardiopulmonary disease. Electronically Signed   By: Dorise Bullion III M.D   On: 11/16/2016 16:11   Dg Shoulder Left  Result Date: 11/16/2016 CLINICAL DATA:  Pain after trauma EXAM: LEFT SHOULDER - 2+ VIEW COMPARISON:  None. FINDINGS: There is no evidence of fracture or dislocation. There is no evidence of arthropathy or other focal bone abnormality. Soft tissues are unremarkable. IMPRESSION: Negative. Electronically Signed   By: Dorise Bullion III M.D   On: 11/16/2016 16:12   Initial Impression / Assessment and Plan / ED Course  I have reviewed the triage vital signs and the nursing notes.  Pertinent labs & imaging results that were available during my care of the patient were reviewed by me and considered in my medical decision making (see chart for details).  Clinical Course   4:40 PM patient is alert and talkative. Dr. Doyle Askew from hospitalist service consulted and will arrange for overnight stay. Fever and cough patient may have flulike illness. He is a fall risk.   Final Clinical Impressions(s) / ED Diagnoses  Diagnoses #1 febrile illness #2 multiple falls #3 contusions to multiple sites Final diagnoses:  None    New Prescriptions New Prescriptions   No medications on file     Orlie Dakin, MD 11/16/16 [place], MD 11/16/16 1652

## 2016-11-16 NOTE — ED Notes (Signed)
Patient transported to X-ray 

## 2016-11-16 NOTE — Progress Notes (Signed)
New pt admission from ED. Pt brought to the floor in stable condition. Vitals taken. Initial Assessment done. Pt has hematoma to L knee s/p fall at home. Pt complaining of pain to L shoulder. Bilateral skin tears to both elbows. Pt has multiple scratches to abdomen and back. Pt buttox red but intact. Pt scrotum and penis very red and painful. Daughter attributes redness to all the powder and sprays pt puts on penis after washing.   All immediate pertinent needs to patient addressed. Patient Guide given to patient. Important safety instructions relating to hospitalization reviewed with patient. Patient verbalized understanding. Bed alarm placed due to high fall risk. Room close to RN station. Will continue to monitor pt.  Maurene Capes RN

## 2016-11-17 LAB — BASIC METABOLIC PANEL
ANION GAP: 9 (ref 5–15)
BUN: 18 mg/dL (ref 6–20)
CHLORIDE: 107 mmol/L (ref 101–111)
CO2: 21 mmol/L — AB (ref 22–32)
Calcium: 8.3 mg/dL — ABNORMAL LOW (ref 8.9–10.3)
Creatinine, Ser: 1.18 mg/dL (ref 0.61–1.24)
GFR calc Af Amer: 58 mL/min — ABNORMAL LOW (ref >60)
GFR calc non Af Amer: 50 mL/min — ABNORMAL LOW (ref >60)
GLUCOSE: 126 mg/dL — AB (ref 65–99)
POTASSIUM: 3.6 mmol/L (ref 3.5–5.1)
Sodium: 137 mmol/L (ref 135–145)

## 2016-11-17 LAB — RESPIRATORY PANEL BY PCR
Adenovirus: NOT DETECTED
Bordetella pertussis: NOT DETECTED
CHLAMYDOPHILA PNEUMONIAE-RVPPCR: NOT DETECTED
CORONAVIRUS 229E-RVPPCR: NOT DETECTED
CORONAVIRUS HKU1-RVPPCR: NOT DETECTED
CORONAVIRUS NL63-RVPPCR: NOT DETECTED
Coronavirus OC43: NOT DETECTED
Influenza A H3: DETECTED — AB
Influenza B: NOT DETECTED
MYCOPLASMA PNEUMONIAE-RVPPCR: NOT DETECTED
Metapneumovirus: NOT DETECTED
PARAINFLUENZA VIRUS 2-RVPPCR: NOT DETECTED
PARAINFLUENZA VIRUS 3-RVPPCR: NOT DETECTED
Parainfluenza Virus 1: NOT DETECTED
Parainfluenza Virus 4: NOT DETECTED
Respiratory Syncytial Virus: NOT DETECTED
Rhinovirus / Enterovirus: NOT DETECTED

## 2016-11-17 LAB — STREP PNEUMONIAE URINARY ANTIGEN: Strep Pneumo Urinary Antigen: NEGATIVE

## 2016-11-17 LAB — CBC
HEMATOCRIT: 38.6 % — AB (ref 39.0–52.0)
HEMOGLOBIN: 12.5 g/dL — AB (ref 13.0–17.0)
MCH: 29.7 pg (ref 26.0–34.0)
MCHC: 32.4 g/dL (ref 30.0–36.0)
MCV: 91.7 fL (ref 78.0–100.0)
Platelets: 143 10*3/uL — ABNORMAL LOW (ref 150–400)
RBC: 4.21 MIL/uL — ABNORMAL LOW (ref 4.22–5.81)
RDW: 16 % — ABNORMAL HIGH (ref 11.5–15.5)
WBC: 6.9 10*3/uL (ref 4.0–10.5)

## 2016-11-17 LAB — URINE CULTURE: CULTURE: NO GROWTH

## 2016-11-17 LAB — LACTIC ACID, PLASMA: Lactic Acid, Venous: 1.7 mmol/L (ref 0.5–1.9)

## 2016-11-17 MED ORDER — IPRATROPIUM-ALBUTEROL 0.5-2.5 (3) MG/3ML IN SOLN: 3.0000 mL | RESPIRATORY_TRACT | Status: DC | PRN

## 2016-11-17 MED ORDER — OSELTAMIVIR PHOSPHATE 30 MG PO CAPS
30.0000 mg | ORAL_CAPSULE | Freq: Every day | ORAL | Status: DC
  Administered 2016-11-17 – 2016-11-18 (×2): 30 mg via ORAL
  Filled 2016-11-17 (×2): qty 1

## 2016-11-17 MED ORDER — GUAIFENESIN-DM 100-10 MG/5ML PO SYRP: 5.0000 mL | ORAL_SOLUTION | ORAL | Status: DC | PRN

## 2016-11-17 NOTE — Progress Notes (Addendum)
Patient ID: [name], male   DOB: [birth year], 83 y.o.   MRN: [id number]    PROGRESS NOTE    [name]  [id number] DOB: October 30, 1917 DOA: 11/16/2016  PCP: Hoyt Koch, MD   Brief Narrative:  83 y.o. male with medical history significant of HTN, HLD, atrial fib on Eliquis , dementia, presented to [facility] ED for evaluation of several days duration of progressive weakness, several falls in the past 24 hours. Please note that pt looks somnolent at the time of the admission and daughter at bedside provides most of the information. Pt's daughter reports that pt has had fevers at home some cough but mostly unproductive, has not been eating much and has fallen three times today. No reports of chest pain, no reports of unilateral weakness or dizziness, no LOC.   ED Course: Pt hemodynamically stable, VS notable for oxygen saturation 87% on RA, RR 29 bpm. CXR with no acute findings. Lactic acid > 2. TRH asked to admit for further evaluation  Assessment & Plan:  RUL PNA, Influenza + - CT chest conformed RUL PNA, influenza + but unclear if bacterial component present as well - will continue Zithromax and Rocephin day #2, added tamiflu today - provide BD as needed - add antitussives as needed  - sputum cultures, final resp viral panel, urine legionella and strep pneumo still pending    Weakness, falls - likely secondary to flu, PNA and subsequent dehydration  - PT eval done, SNF recommended, will discuss with daughter  - blood cultures also requested - keep on IVF for now until oral intake improves   Atrial fib - continue Eliquis - keep on tele     DVT prophylaxis: Eliquis Code Status: DNR Family Communication: Patient at bedside  Disposition Plan: to be determined   Consultants:   None  Procedures:   None  Antimicrobials:   Zithromax 1/7 -->  Rocephin 1/7 -->  Tamiflu 1/8 -->   Subjective: No events overnight.  Objective: Vitals:   11/16/16 2012  11/16/16 2333 11/17/16 0558 11/17/16 1235  BP: 114/70 (!) 141/88 120/73 115/63  Pulse: 63 91 75 75  Resp: 20 (!) 22 (!) 8 18  Temp: 97.8 F (36.6 C) 97.7 F (36.5 C) 98.5 F (36.9 C) 98.1 F (36.7 C)  TempSrc: Oral Oral Oral Oral  SpO2: 93% 98% 95% 91%  Weight:   82.1 kg (181 lb)   Height:        Intake/Output Summary (Last 24 hours) at 11/17/16 1410 Last data filed at 11/17/16 0921  Gross per 24 hour  Intake          1836.25 ml  Output                0 ml  Net          1836.25 ml   Filed Weights   11/16/16 1219 11/17/16 0558  Weight: 81.6 kg (180 lb) 82.1 kg (181 lb)    Examination:  General exam: Appears calm and comfortable  Respiratory system: rhonchi at bases with diminished breath sounds, no wheezing  Cardiovascular system: IRRR. No JVD, rubs, gallops or clicks. No pedal edema. Gastrointestinal system: Abdomen is nondistended, soft and nontender. No organomegaly or masses felt.  Central nervous system: Alert and oriented. No focal neurological deficits.  Data Reviewed: I have personally reviewed following labs and imaging studies  CBC:  Recent Labs Lab 11/16/16 1359 11/17/16 0039  WBC 7.9 6.9  NEUTROABS 6.8  --  HGB 14.2 12.5*  HCT 42.5 38.6*  MCV 90.8 91.7  PLT 161 [id number]*   Basic Metabolic Panel:  Recent Labs Lab 11/16/16 1359 11/17/16 0039  NA 139 137  K 4.2 3.6  CL 107 107  CO2 25 21*  GLUCOSE 139* 126*  BUN 15 18  CREATININE 1.19 1.18  CALCIUM 9.2 8.3*   Liver Function Tests:  Recent Labs Lab 11/16/16 1359  AST 48*  ALT 37  ALKPHOS 108  BILITOT 1.0  PROT 6.4*  ALBUMIN 3.7   Thyroid Function Tests:  Recent Labs  11/16/16 1903  TSH 1.492   Urine analysis:    Component Value Date/Time   COLORURINE YELLOW 11/16/2016 1459   APPEARANCEUR CLEAR 11/16/2016 1459   LABSPEC 1.023 11/16/2016 1459   PHURINE 5.0 11/16/2016 1459   GLUCOSEU NEGATIVE 11/16/2016 1459   HGBUR NEGATIVE 11/16/2016 1459   BILIRUBINUR NEGATIVE 11/16/2016  1459   KETONESUR NEGATIVE 11/16/2016 1459   PROTEINUR 30 (A) 11/16/2016 1459   UROBILINOGEN 0.2 11/04/2014 1215   NITRITE NEGATIVE 11/16/2016 1459   LEUKOCYTESUR NEGATIVE 11/16/2016 1459   Recent Results (from the past 240 hour(s))  Urine culture     Status: None   Collection Time: 11/16/16  2:59 PM  Result Value Ref Range Status   Specimen Description URINE, RANDOM  Final   Special Requests NONE  Final   Culture NO GROWTH  Final   Report Status 11/17/2016 FINAL  Final    Radiology Studies: Dg Chest 2 View Result Date: 11/16/2016 No active cardiopulmonary disease.   Ct Chest Wo Contrast Result Date: 11/16/2016 Trace right-sided pleural fluid. Somewhat nodular right-sided airspace opacity, predominantly at the right upper lobe, may reflect a mild infectious process. 2. Small hiatal hernia noted. 3. Diffuse coronary artery calcifications seen.   Dg Shoulder Left Result Date: 11/16/2016 Negative.    Scheduled Meds: . apixaban  5 mg Oral BID  . azithromycin  500 mg Intravenous Q24H  . cefTRIAXone (ROCEPHIN)  IV  1 g Intravenous Q24H  . famotidine  20 mg Oral Daily  . oseltamivir  30 mg Oral Daily  . pravastatin  10 mg Oral QPM  . sodium chloride flush  3 mL Intravenous Q12H  . vitamin C  500 mg Oral Daily   Continuous Infusions: . sodium chloride 75 mL/hr at 11/16/16 1845     LOS: 1 day   Time spent: 20 minutes   Faye Ramsay, MD [facility] Pager [phone]  If 7PM-7AM, please contact night-coverage [email or web address] Password [email or web address] 11/17/2016, 2:10 PM

## 2016-11-17 NOTE — Evaluation (Addendum)
Physical Therapy Evaluation Patient Details Name: [name] MRN: [id number] DOB: 1917/05/22 Today's Date: 11/17/2016   History of Present Illness  Pt is a 83 y.o. male admitted after several days duration of progressive weakness and several falls in the past 24 hours.PMH: atrial fibrillation, pacemaker, HOH, HTN, CHF, CAD, dementia.   Clinical Impression  Pt admitted with above diagnosis. Pt currently with functional limitations due to the deficits listed below (see PT Problem List). No family present to confirm report of PLF and home situation. Based upon the patient's current mobility, will need 24 hour supervision if going home at D/C. If this is unable to be provided, may need SNF for further rehabilitation. PT to continue to follow.      Follow Up Recommendations SNF;Supervision/Assistance - 24 hour    Equipment Recommendations  None recommended by PT    Recommendations for Other Services       Precautions / Restrictions Precautions Precautions: Fall Precaution Comments: Lt shoulder and knee pain Restrictions Weight Bearing Restrictions: No      Mobility  Bed Mobility Overal bed mobility: Needs Assistance Bed Mobility: Supine to Sit     Supine to sit: Mod assist     General bed mobility comments: HOB elevated, using rail and physical assist to come to sitting EOB. Assist also needed with pad to get hips to EOB.   Transfers Overall transfer level: Needs assistance Equipment used: Rolling walker (2 wheeled) Transfers: Sit to/from [employer] Sit to Stand: Min assist   Squat pivot transfers: Min assist     General transfer comment: Repeated cues needed for sequence to turn and sit.   Ambulation/Gait             General Gait Details: not attempted due to safety  Stairs            Wheelchair Mobility    Modified Rankin (Stroke Patients Only)       Balance Overall balance assessment: Needs assistance Sitting-balance  support: Single extremity supported Sitting balance-Leahy Scale: Fair     Standing balance support: Bilateral upper extremity supported Standing balance-Leahy Scale: Poor Standing balance comment: using rw for support                             Pertinent Vitals/Pain Pain Assessment: Faces Faces Pain Scale: Hurts little more Pain Location: Lt shoulder Pain Descriptors / Indicators: Grimacing;Guarding;Moaning Pain Intervention(s): Limited activity within patient's tolerance;Monitored during session (reports no pain at rest)    Home Living Family/patient expects to be discharged to:: Unsure Living Arrangements: Children Available Help at Discharge: Family Type of Home: House         Home Equipment: Walker - 2 wheels Additional Comments: Pt reports that he lives with his son, has stairs but has a lift. No family present to confirm patient's reports.     Prior Function Level of Independence: Independent with assistive device(s)         Comments: pt reports walking with his walker around the house.      Hand Dominance        Extremity/Trunk Assessment   Upper Extremity Assessment Upper Extremity Assessment: Generalized weakness;LUE deficits/detail LUE Deficits / Details: reports pain in Lt shoulder during mobility    Lower Extremity Assessment Lower Extremity Assessment: Generalized weakness    Cervical / Trunk Assessment Cervical / Trunk Assessment: Kyphotic  Communication   Communication: No difficulties  Cognition Arousal/Alertness: Awake/alert Behavior During  Therapy: WFL for tasks assessed/performed Overall Cognitive Status: No family/caregiver present to determine baseline cognitive functioning                 General Comments: pt not oriented to place or situation.     General Comments      Exercises     Assessment/Plan    PT Assessment Patient needs continued PT services  PT Problem List Decreased strength;Decreased activity  tolerance;Decreased range of motion;Decreased balance;Decreased mobility;Decreased cognition;Decreased safety awareness;Pain          PT Treatment Interventions DME instruction;Gait training;Stair training;Functional mobility training;Therapeutic activities;Therapeutic exercise;Balance training;Neuromuscular re-education;Patient/family education    PT Goals (Current goals can be found in the Care Plan section)  Acute Rehab PT Goals Patient Stated Goal: not expressed PT Goal Formulation: With patient Time For Goal Achievement: 12/01/16 Potential to Achieve Goals: Fair    Frequency Min 3X/week   Barriers to discharge        Co-evaluation               End of Session Equipment Utilized During Treatment: Gait belt Activity Tolerance: Patient limited by fatigue Patient left: in chair;with call bell/phone within reach;with chair alarm set Nurse Communication: Mobility status         Time: [id number] PT Time Calculation (min) (ACUTE ONLY): 25 min   Charges:   PT Evaluation $PT Eval Moderate Complexity: 1 Procedure PT Treatments $Therapeutic Activity: 8-22 mins   PT G Codes:        Cassell Clement, PT, CSCS Pager [phone] Office [phone]  11/17/2016, 10:49 AM

## 2016-11-18 LAB — BASIC METABOLIC PANEL
Anion gap: 9 (ref 5–15)
BUN: 17 mg/dL (ref 6–20)
CO2: 21 mmol/L — ABNORMAL LOW (ref 22–32)
CREATININE: 1.12 mg/dL (ref 0.61–1.24)
Calcium: 8.4 mg/dL — ABNORMAL LOW (ref 8.9–10.3)
Chloride: 108 mmol/L (ref 101–111)
GFR calc Af Amer: >60 mL/min (ref >60)
GFR, EST NON AFRICAN AMERICAN: 53 mL/min — AB (ref >60)
GLUCOSE: 102 mg/dL — AB (ref 65–99)
Potassium: 3.3 mmol/L — ABNORMAL LOW (ref 3.5–5.1)
SODIUM: 138 mmol/L (ref 135–145)

## 2016-11-18 LAB — CBC
HCT: 40.7 % (ref 39.0–52.0)
Hemoglobin: 13.3 g/dL (ref 13.0–17.0)
MCH: 30.1 pg (ref 26.0–34.0)
MCHC: 32.7 g/dL (ref 30.0–36.0)
MCV: 92.1 fL (ref 78.0–100.0)
PLATELETS: 141 10*3/uL — AB (ref 150–400)
RBC: 4.42 MIL/uL (ref 4.22–5.81)
RDW: 15.8 % — ABNORMAL HIGH (ref 11.5–15.5)
WBC: 7.6 10*3/uL (ref 4.0–10.5)

## 2016-11-18 LAB — LEGIONELLA PNEUMOPHILA SEROGP 1 UR AG: L. PNEUMOPHILA SEROGP 1 UR AG: NEGATIVE

## 2016-11-18 LAB — PROCALCITONIN: Procalcitonin: <0.1 ng/mL

## 2016-11-18 MED ORDER — POTASSIUM CHLORIDE CRYS ER 20 MEQ PO TBCR
40.0000 meq | EXTENDED_RELEASE_TABLET | Freq: Once | ORAL | Status: AC
  Administered 2016-11-18: 40 meq via ORAL
  Filled 2016-11-18: qty 2

## 2016-11-18 MED ORDER — OSELTAMIVIR PHOSPHATE 30 MG PO CAPS
30.0000 mg | ORAL_CAPSULE | Freq: Two times a day (BID) | ORAL | Status: DC
  Administered 2016-11-18 – 2016-11-20 (×5): 30 mg via ORAL
  Filled 2016-11-18 (×6): qty 1

## 2016-11-18 NOTE — NC FL2 (Signed)
[place] MEDICAID FL2 LEVEL OF CARE SCREENING TOOL     IDENTIFICATION  Patient Name: [name] Birthdate: [birth year] Sex: male Admission Date (Current Location): 11/16/2016  [facility] and [place] Number:  Herbalist and Address:  [facility], [place] [address] [place], [place] [address]      Provider Number: [id number]  Attending Physician Name and Address:  Theodis Blaze, MD  Relative Name and Phone Number:       Current Level of Care: Hospital Recommended Level of Care: [place] Prior Approval Number:    Date Approved/Denied:   PASRR Number: [id number]  Discharge Plan: SNF    Current Diagnoses: Patient Active Problem List   Diagnosis Date Noted  . Dehydration 11/16/2016  . Back pain 08/30/2015  . Pain in joint, shoulder region 03/08/2015  . Dysphagia 03/08/2015  . Pacemaker 01/30/2015  . Aortic stenosis 01/30/2015  . TIA (transient ischemic attack) 11/05/2014  . Transaminitis 11/05/2014  . CVA (cerebral infarction) 11/04/2014  . SDH (subdural hematoma) ([place]) 03/23/2014  . Osteoarthritis of ankle   . Mitral valve disorder 01/07/2011  . OSTEOPOROSIS 08/13/2010  . DEGENERATIVE JOINT DISEASE, HANDS 06/07/2009  . Chronic kidney disease 04/04/2009  . UNSTEADY GAIT 12/19/2008  . ATRIAL FIBRILLATION, PAROXYSMAL 09/29/2008  . G E R D 12/02/2007  . BARRETTS ESOPHAGUS 12/02/2007  . ADENOCARCINOMA, PROSTATE, HX OF 11/16/2007  . Essential hypertension 10/11/2007  . Elevated lipids 05/20/2007  . Coronary atherosclerosis 05/20/2007  . Chronic combined systolic and diastolic congestive heart failure ([place]) 05/20/2007    Orientation RESPIRATION BLADDER Height & Weight     Self, Time, Situation, Place  Normal Continent Weight: 163 lb 4.8 oz (74.1 kg) (scale c) Height:  5\' 7"  (170.2 cm)  BEHAVIORAL SYMPTOMS/MOOD NEUROLOGICAL BOWEL NUTRITION STATUS   (None)  (None) Continent Diet (Regular)  AMBULATORY STATUS  COMMUNICATION OF NEEDS Skin   Extensive Assist Verbally Skin abrasions, Other (Comment) (MASD, Excoriated, Skin tear)                       Personal Care Assistance Level of Assistance              Functional Limitations Info  Sight, Hearing, Speech Sight Info: Adequate Hearing Info: Impaired Speech Info: Adequate    SPECIAL CARE FACTORS FREQUENCY  PT (By licensed PT), Blood pressure     PT Frequency: 5 x week              Contractures Contractures Info: Not present    Additional Factors Info  Code Status, Allergies, Isolation Precautions Code Status Info: DNR Allergies Info: NKDA     Isolation Precautions Info: Droplet     Current Medications (11/18/2016):  This is the current hospital active medication list Current Facility-Administered Medications  Medication Dose Route Frequency Provider Last Rate Last Dose  . acetaminophen (TYLENOL) tablet 650 mg  650 mg Oral Q6H PRN Theodis Blaze, MD   650 mg at 11/16/16 2356  . apixaban (ELIQUIS) tablet 5 mg  5 mg Oral BID Theodis Blaze, MD   5 mg at 11/18/16 0848  . azithromycin (ZITHROMAX) 500 mg in dextrose 5 % 250 mL IVPB  500 mg Intravenous Q24H Theodis Blaze, MD   500 mg at 11/17/16 1715  . cefTRIAXone (ROCEPHIN) 1 g in dextrose 5 % 50 mL IVPB  1 g Intravenous Q24H Theodis Blaze, MD   1 g at 11/17/16 2305  .  famotidine (PEPCID) tablet 20 mg  20 mg Oral Daily Theodis Blaze, MD   20 mg at 11/18/16 0848  . guaiFENesin-dextromethorphan (ROBITUSSIN DM) 100-10 MG/5ML syrup 5 mL  5 mL Oral Q4H PRN Theodis Blaze, MD      . ipratropium-albuterol (DUONEB) 0.5-2.5 (3) MG/3ML nebulizer solution 3 mL  3 mL Nebulization Q4H PRN Theodis Blaze, MD      . ondansetron [facility]) tablet 4 mg  4 mg Oral Q6H PRN Theodis Blaze, MD       Or  . ondansetron [facility]) injection 4 mg  4 mg Intravenous Q6H PRN Theodis Blaze, MD      . oseltamivir (TAMIFLU) capsule 30 mg  30 mg Oral BID Theodis Blaze, MD      . pravastatin (PRAVACHOL) tablet 10  mg  10 mg Oral QPM Theodis Blaze, MD   10 mg at 11/17/16 1711  . sodium chloride flush (NS) 0.9 % injection 3 mL  3 mL Intravenous Q12H Theodis Blaze, MD   3 mL at 11/18/16 0849  . vitamin C (ASCORBIC ACID) tablet 500 mg  500 mg Oral Daily Theodis Blaze, MD   500 mg at 11/18/16 0848     Discharge Medications: Please see discharge summary for a list of discharge medications.  Relevant Imaging Results:  Relevant Lab Results:   Additional Information SS#: [id number]  Candie Chroman, LCSW

## 2016-11-18 NOTE — Clinical Social Work Note (Addendum)
CSW met with patient's daughter. Patient asleep and did not arouse to conversation. Patient's daughter agreeable to SNF. Patient and his wife live with his daughter. CSW left SNF list with patient's daughter. Her first preference is [facility]. CSW will come back later when patient is awake to discuss SNF placement.  Dayton Scrape, CSW [phone]  4:19 pm CSW went back to patient's room and he was still sleeping. Will try again in the morning.  Dayton Scrape, [place]

## 2016-11-18 NOTE — Progress Notes (Addendum)
Patient ID: [name], male   DOB: 12/28/1916, 83 y.o.   MRN: [id number]    PROGRESS NOTE    GENEROSO BATTENFIELD  [id number] DOB: 09-26-1917 DOA: 11/16/2016  PCP: Hoyt Koch, MD   Brief Narrative:  83 y.o. male with medical history significant of HTN, HLD, atrial fib on Eliquis , dementia, presented to [facility] ED for evaluation of several days duration of progressive weakness, several falls in the past 24 hours. Please note that pt looks somnolent at the time of the admission and daughter at bedside provides most of the information. Pt's daughter reports that pt has had fevers at home some cough but mostly unproductive, has not been eating much and has fallen three times today. No reports of chest pain, no reports of unilateral weakness or dizziness, no LOC.   ED Course: Pt hemodynamically stable, VS notable for oxygen saturation 87% on RA, RR 29 bpm. CXR with no acute findings. Lactic acid > 2. TRH asked to admit for further evaluation  Assessment & Plan:  RUL PNA, Influenza + - CT chest conformed RUL PNA, influenza + but unclear if bacterial component present as well - will continue Zithromax and Rocephin day #3, added tamiflu  - provide BD as needed - add anteditussives as needed  - sputum cultures, final resp viral panel, urine legionella and strep pneumo still pending    Weakness, falls - likely secondary to flu, PNA and subsequent dehydration  - PT eval done, SNF recommended, daughter in agreement  - blood cultures also requested - d/c IVF to see how pt does, appetite is still rather poor, we talked about trying to eat bit more   Atrial fib - continue Eliquis - keep on tele   Hypokalemia - mild, supplemented - BMP in AM  Thrombocytopenia - mild, reactive - CBC in AM  DVT prophylaxis: Eliquis Code Status: DNR Family Communication: Patient and daughter at bedside  Disposition Plan: SNF in 1-2 days   Consultants:   None  Procedures:    None  Antimicrobials:   Zithromax 1/7 -->  Rocephin 1/7 -->  Tamiflu 1/8 -->  Subjective: No events overnight.  Objective: Vitals:   11/17/16 1235 11/17/16 2012 11/18/16 0034 11/18/16 0600  BP: 115/63 121/80 125/78 (!) 132/96  Pulse: 75 92 91 90  Resp: 18 18 18 18   Temp: 98.1 F (36.7 C) 98.8 F (37.1 C) 98.7 F (37.1 C) 98.5 F (36.9 C)  TempSrc: Oral Oral Oral Oral  SpO2: 91% 93% 94% 93%  Weight:    74.1 kg (163 lb 4.8 oz)  Height:        Intake/Output Summary (Last 24 hours) at 11/18/16 1155 Last data filed at 11/18/16 0951  Gross per 24 hour  Intake             1275 ml  Output              300 ml  Net              975 ml   Filed Weights   11/16/16 1219 11/17/16 0558 11/18/16 0600  Weight: 81.6 kg (180 lb) 82.1 kg (181 lb) 74.1 kg (163 lb 4.8 oz)    Examination:  General exam: Appears calm and comfortable  Respiratory system: rhonchi at bases with diminished breath sounds, no wheezing  Cardiovascular system: IRRR. No JVD, rubs, gallops or clicks. No pedal edema. Gastrointestinal system: Abdomen is nondistended, soft and nontender. No organomegaly or masses felt.  Central  nervous system: Alert and oriented. No focal neurological deficits.  Data Reviewed: I have personally reviewed following labs and imaging studies  CBC:  Recent Labs Lab 11/16/16 1359 11/17/16 0039 11/18/16 0506  WBC 7.9 6.9 7.6  NEUTROABS 6.8  --   --   HGB 14.2 12.5* 13.3  HCT 42.5 38.6* 40.7  MCV 90.8 91.7 92.1  PLT 161 143* [id number]*   Basic Metabolic Panel:  Recent Labs Lab 11/16/16 1359 11/17/16 0039 11/18/16 0506  NA 139 137 138  K 4.2 3.6 3.3*  CL 107 107 108  CO2 25 21* 21*  GLUCOSE 139* 126* 102*  BUN 15 18 17   CREATININE 1.19 1.18 1.12  CALCIUM 9.2 8.3* 8.4*   Liver Function Tests:  Recent Labs Lab 11/16/16 1359  AST 48*  ALT 37  ALKPHOS 108  BILITOT 1.0  PROT 6.4*  ALBUMIN 3.7   Thyroid Function Tests:  Recent Labs  11/16/16 1903  TSH  1.492   Urine analysis:    Component Value Date/Time   COLORURINE YELLOW 11/16/2016 1459   APPEARANCEUR CLEAR 11/16/2016 1459   LABSPEC 1.023 11/16/2016 1459   PHURINE 5.0 11/16/2016 1459   GLUCOSEU NEGATIVE 11/16/2016 1459   HGBUR NEGATIVE 11/16/2016 1459   BILIRUBINUR NEGATIVE 11/16/2016 1459   KETONESUR NEGATIVE 11/16/2016 1459   PROTEINUR 30 (A) 11/16/2016 1459   UROBILINOGEN 0.2 11/04/2014 1215   NITRITE NEGATIVE 11/16/2016 1459   LEUKOCYTESUR NEGATIVE 11/16/2016 1459   Recent Results (from the past 240 hour(s))  Blood Culture (routine x 2)     Status: None (Preliminary result)   Collection Time: 11/16/16  1:59 PM  Result Value Ref Range Status   Specimen Description BLOOD RIGHT ANTECUBITAL  Final   Special Requests BOTTLES DRAWN AEROBIC AND ANAEROBIC  5CC  Final   Culture NO GROWTH 1 DAY  Final   Report Status PENDING  Incomplete  Blood Culture (routine x 2)     Status: None (Preliminary result)   Collection Time: 11/16/16  2:29 PM  Result Value Ref Range Status   Specimen Description BLOOD LEFT ANTECUBITAL  Final   Special Requests BOTTLES DRAWN AEROBIC AND ANAEROBIC  5CC  Final   Culture NO GROWTH 1 DAY  Final   Report Status PENDING  Incomplete  Urine culture     Status: None   Collection Time: 11/16/16  2:59 PM  Result Value Ref Range Status   Specimen Description URINE, RANDOM  Final   Special Requests NONE  Final   Culture NO GROWTH  Final   Report Status 11/17/2016 FINAL  Final  Respiratory Panel by PCR     Status: Abnormal   Collection Time: 11/17/16  9:44 AM  Result Value Ref Range Status   Adenovirus NOT DETECTED NOT DETECTED Final   Coronavirus 229E NOT DETECTED NOT DETECTED Final   Coronavirus HKU1 NOT DETECTED NOT DETECTED Final   Coronavirus NL63 NOT DETECTED NOT DETECTED Final   Coronavirus OC43 NOT DETECTED NOT DETECTED Final   Metapneumovirus NOT DETECTED NOT DETECTED Final   Rhinovirus / Enterovirus NOT DETECTED NOT DETECTED Final   Influenza  A H3 DETECTED (A) NOT DETECTED Final   Influenza B NOT DETECTED NOT DETECTED Final   Parainfluenza Virus 1 NOT DETECTED NOT DETECTED Final   Parainfluenza Virus 2 NOT DETECTED NOT DETECTED Final   Parainfluenza Virus 3 NOT DETECTED NOT DETECTED Final   Parainfluenza Virus 4 NOT DETECTED NOT DETECTED Final   Respiratory Syncytial Virus NOT DETECTED NOT DETECTED Final  Bordetella pertussis NOT DETECTED NOT DETECTED Final   Chlamydophila pneumoniae NOT DETECTED NOT DETECTED Final   Mycoplasma pneumoniae NOT DETECTED NOT DETECTED Final    Radiology Studies: Dg Chest 2 View Result Date: 11/16/2016 No active cardiopulmonary disease.   Ct Chest Wo Contrast Result Date: 11/16/2016 Trace right-sided pleural fluid. Somewhat nodular right-sided airspace opacity, predominantly at the right upper lobe, may reflect a mild infectious process. 2. Small hiatal hernia noted. 3. Diffuse coronary artery calcifications seen.   Dg Shoulder Left Result Date: 11/16/2016 Negative.    Scheduled Meds: . apixaban  5 mg Oral BID  . azithromycin  500 mg Intravenous Q24H  . cefTRIAXone (ROCEPHIN)  IV  1 g Intravenous Q24H  . famotidine  20 mg Oral Daily  . oseltamivir  30 mg Oral Daily  . potassium chloride  40 mEq Oral Once  . pravastatin  10 mg Oral QPM  . sodium chloride flush  3 mL Intravenous Q12H  . vitamin C  500 mg Oral Daily   Continuous Infusions: . sodium chloride 75 mL/hr at 11/16/16 1845     LOS: 2 days   Time spent: 20 minutes   Faye Ramsay, MD [facility] Pager [phone]  If 7PM-7AM, please contact night-coverage [email or web address] Password [email or web address] 11/18/2016, 11:55 AM

## 2016-11-19 LAB — BASIC METABOLIC PANEL
ANION GAP: 7 (ref 5–15)
BUN: 15 mg/dL (ref 6–20)
CALCIUM: 8.5 mg/dL — AB (ref 8.9–10.3)
CO2: 20 mmol/L — AB (ref 22–32)
Chloride: 111 mmol/L (ref 101–111)
Creatinine, Ser: 0.94 mg/dL (ref 0.61–1.24)
Glucose, Bld: 101 mg/dL — ABNORMAL HIGH (ref 65–99)
POTASSIUM: 3.9 mmol/L (ref 3.5–5.1)
Sodium: 138 mmol/L (ref 135–145)

## 2016-11-19 LAB — CBC
HEMATOCRIT: 40 % (ref 39.0–52.0)
Hemoglobin: 13.5 g/dL (ref 13.0–17.0)
MCH: 30.6 pg (ref 26.0–34.0)
MCHC: 33.8 g/dL (ref 30.0–36.0)
MCV: 90.7 fL (ref 78.0–100.0)
PLATELETS: 147 10*3/uL — AB (ref 150–400)
RBC: 4.41 MIL/uL (ref 4.22–5.81)
RDW: 15.5 % (ref 11.5–15.5)
WBC: 6.6 10*3/uL (ref 4.0–10.5)

## 2016-11-19 NOTE — Clinical Social Work Note (Signed)
Clinical Social Work Assessment  Patient Details  Name: [name] MRN: [id number] Date of Birth: 06-25-1917  Date of referral:  11/19/16               Reason for consult:  Facility Placement, Discharge Planning                Permission sought to share information with:  Facility Sport and exercise psychologist, Family Supports Permission granted to share information::  Yes, Verbal Permission Granted  Name::     Renata Caprice  Agency::  SNF's  Relationship::  Daughter  Contact Information:  [phone]  Housing/Transportation Living arrangements for the past 2 months:  Single Family Home Source of Information:  Patient, Medical Team, Adult Children Patient Interpreter Needed:  None Criminal Activity/Legal Involvement Pertinent to Current Situation/Hospitalization:  No - Comment as needed Significant Relationships:  Adult Children, Spouse Lives with:  Adult Children, Spouse Do you feel safe going back to the place where you live?  Yes Need for family participation in patient care:  Yes (Comment)  Care giving concerns:  PT recommending SNF once medically stable for discharge.   Social Worker assessment / plan:  CSW met with patient. No supports at bedside. CSW introduced role and explained that PT recommendations would be discussed. Patient hard of hearing but was able to understand CSW when CSW spoke loud and slowly. Patient initially not agreeable to SNF. CSW discussed conversation with daughter yesterday and how she wants him to go. Patient agreeable to go to SNF if his daughter wants him to go. No further concerns. CSW encouraged patient to contact CSW as needed. CSW will continue to follow patient and his family for support and facilitate discharge to SNF once medically stable.  Employment status:  Retired Forensic scientist:  Medicare PT Recommendations:  [place] / Referral to community resources:  [place]  Patient/Family's Response  to care:  Patient and his daughter agreeable to SNF. Patient's family supportive and involved in patient's care. Patient and his daughter appreciated social work intervention.  Patient/Family's Understanding of and Emotional Response to Diagnosis, Current Treatment, and Prognosis:  Patient understands reason for current hospitalization. Patient appears pleased with hospital care.  Emotional Assessment Appearance:  Appears stated age Attitude/Demeanor/Rapport:  Other (Pleasant) Affect (typically observed):  Accepting, Appropriate, Calm, Pleasant Orientation:  Oriented to Self, Oriented to Place, Oriented to  Time, Oriented to Situation Alcohol / Substance use:  Never Used Psych involvement (Current and /or in the community):  No (Comment)  Discharge Needs  Concerns to be addressed:  Care Coordination Readmission within the last 30 days:  No Current discharge risk:  Dependent with Mobility Barriers to Discharge:  Continued Medical Work up   Candie Chroman, LCSW 11/19/2016, 11:56 AM

## 2016-11-19 NOTE — Clinical Social Work Placement (Signed)
   CLINICAL SOCIAL WORK PLACEMENT  NOTE  Date:  11/19/2016  Patient Details  Name: [name] MRN: [id number] Date of Birth: [birth year]  Clinical Social Work is seeking post-discharge placement for this patient at the [place] level of care (*CSW will initial, date and re-position this form in  chart as items are completed):  Yes   Patient/family provided with [facility] Work Department's list of facilities offering this level of care within the geographic area requested by the patient (or if unable, by the patient's family).  Yes   Patient/family informed of their freedom to choose among providers that offer the needed level of care, that participate in Medicare, Medicaid or managed care program needed by the patient, have an available bed and are willing to accept the patient.  Yes   Patient/family informed of [facility]'s ownership interest in [facility] and [facility], as well as of the fact that they are under no obligation to receive care at these facilities.  PASRR submitted to EDS on 11/18/16     PASRR number received on 11/18/16     Existing PASRR number confirmed on       FL2 transmitted to all facilities in geographic area requested by pt/family on 11/19/16     FL2 transmitted to all facilities within larger geographic area on       Patient informed that his/her managed care company has contracts with or will negotiate with certain facilities, including the following:            Patient/family informed of bed offers received.  Patient chooses bed at       Physician recommends and patient chooses bed at      Patient to be transferred to   on  .  Patient to be transferred to facility by       Patient family notified on   of transfer.  Name of family member notified:        PHYSICIAN       Additional Comment:    _______________________________________________ Candie Chroman, LCSW 11/19/2016, 11:59 AM

## 2016-11-19 NOTE — Progress Notes (Addendum)
Patient ID: [name], male   DOB: [birth year], 83 y.o.   MRN: [id number]    PROGRESS NOTE    TUDOR LEGNER  [id number] DOB: [birth year] DOA: 11/16/2016  PCP: Hoyt Koch, MD   Brief Narrative:  83 y.o. male with medical history significant of HTN, HLD, atrial fib on Eliquis , dementia, presented to [facility] ED for evaluation of several days duration of progressive weakness, several falls in the past 24 hours. Please note that pt looks somnolent at the time of the admission and daughter at bedside provides most of the information. Pt's daughter reports that pt has had fevers at home some cough but mostly unproductive, has not been eating much and has fallen three times today. No reports of chest pain, no reports of unilateral weakness or dizziness, no LOC.   ED Course: Pt hemodynamically stable, VS notable for oxygen saturation 87% on RA, RR 29 bpm. CXR with no acute findings. Lactic acid > 2. TRH asked to admit for further evaluation  Assessment & Plan:  Viral RUL PNA, CAP Influenza + - CT chest conformed RUL PNA, influenza +, unclear if bacterial component present as well - will continue Zithromax and Rocephin day #4, added tamiflu  - provide BD as needed - add anteditussives as needed  - pt overall improving but still with very minimal oral intake - encourage oral intake today as we have stopped IVF - if better in next 24 hours, suspect pt can be d/c in AM   Weakness, falls - likely secondary to flu, PNA and subsequent dehydration  - PT eval done, SNF recommended, daughter in agreement  - blood cultures also requested - appetite is still rather poor, we talked about trying to eat bit more   Atrial fib, chronic  - continue Eliquis - HR in 90's - keep on tele one more day  Hypokalemia - mild, supplemented and WNL this AM - BMP in AM  Thrombocytopenia - mild, reactive, overall improving  - CBC in AM  DVT prophylaxis: Eliquis Code Status: DNR Family  Communication: Patient and daughter at bedside  Disposition Plan: SNF in am   Consultants:   None  Procedures:   None  Antimicrobials:   Zithromax 1/7 -->  Rocephin 1/7 -->  Tamiflu 1/8 -->  Subjective: No events overnight.  Objective: Vitals:   11/18/16 0600 11/18/16 1324 11/18/16 2055 11/19/16 0404  BP: (!) 132/96 129/81 115/67 (!) 139/92  Pulse: 90 (!) 126 73 95  Resp: 18 18 18 18   Temp: 98.5 F (36.9 C) 97.6 F (36.4 C) 98.1 F (36.7 C) 97.9 F (36.6 C)  TempSrc: Oral Oral Oral Oral  SpO2: 93% 94% 93% 95%  Weight: 74.1 kg (163 lb 4.8 oz)   84.9 kg (187 lb 3.2 oz)  Height:        Intake/Output Summary (Last 24 hours) at 11/19/16 1132 Last data filed at 11/19/16 1007  Gross per 24 hour  Intake              340 ml  Output              101 ml  Net              239 ml   Filed Weights   11/17/16 0558 11/18/16 0600 11/19/16 0404  Weight: 82.1 kg (181 lb) 74.1 kg (163 lb 4.8 oz) 84.9 kg (187 lb 3.2 oz)    Examination:  General exam: Appears calm and comfortable  Respiratory  system: rhonchi at bases with diminished breath sounds, no wheezing  Cardiovascular system: IRRR. No JVD, rubs, gallops or clicks. No pedal edema. Gastrointestinal system: Abdomen is nondistended, soft and nontender. No organomegaly or masses felt.  Central nervous system: Alert and oriented. No focal neurological deficits.  Data Reviewed: I have personally reviewed following labs and imaging studies  CBC:  Recent Labs Lab 11/16/16 1359 11/17/16 0039 11/18/16 0506 11/19/16 0506  WBC 7.9 6.9 7.6 6.6  NEUTROABS 6.8  --   --   --   HGB 14.2 12.5* 13.3 13.5  HCT 42.5 38.6* 40.7 40.0  MCV 90.8 91.7 92.1 90.7  PLT 161 143* 141* [id number]*   Basic Metabolic Panel:  Recent Labs Lab 11/16/16 1359 11/17/16 0039 11/18/16 0506 11/19/16 0506  NA 139 137 138 138  K 4.2 3.6 3.3* 3.9  CL 107 107 108 111  CO2 25 21* 21* 20*  GLUCOSE 139* 126* 102* 101*  BUN 15 18 17 15   CREATININE  1.19 1.18 1.12 0.94  CALCIUM 9.2 8.3* 8.4* 8.5*   Liver Function Tests:  Recent Labs Lab 11/16/16 1359  AST 48*  ALT 37  ALKPHOS 108  BILITOT 1.0  PROT 6.4*  ALBUMIN 3.7   Thyroid Function Tests:  Recent Labs  11/16/16 1903  TSH 1.492   Urine analysis:    Component Value Date/Time   COLORURINE YELLOW 11/16/2016 1459   APPEARANCEUR CLEAR 11/16/2016 1459   LABSPEC 1.023 11/16/2016 1459   PHURINE 5.0 11/16/2016 1459   GLUCOSEU NEGATIVE 11/16/2016 1459   HGBUR NEGATIVE 11/16/2016 1459   BILIRUBINUR NEGATIVE 11/16/2016 1459   KETONESUR NEGATIVE 11/16/2016 1459   PROTEINUR 30 (A) 11/16/2016 1459   UROBILINOGEN 0.2 11/04/2014 1215   NITRITE NEGATIVE 11/16/2016 1459   LEUKOCYTESUR NEGATIVE 11/16/2016 1459   Recent Results (from the past 240 hour(s))  Blood Culture (routine x 2)     Status: None (Preliminary result)   Collection Time: 11/16/16  1:59 PM  Result Value Ref Range Status   Specimen Description BLOOD RIGHT ANTECUBITAL  Final   Special Requests BOTTLES DRAWN AEROBIC AND ANAEROBIC  5CC  Final   Culture NO GROWTH 2 DAYS  Final   Report Status PENDING  Incomplete  Blood Culture (routine x 2)     Status: None (Preliminary result)   Collection Time: 11/16/16  2:29 PM  Result Value Ref Range Status   Specimen Description BLOOD LEFT ANTECUBITAL  Final   Special Requests BOTTLES DRAWN AEROBIC AND ANAEROBIC  5CC  Final   Culture NO GROWTH 2 DAYS  Final   Report Status PENDING  Incomplete  Urine culture     Status: None   Collection Time: 11/16/16  2:59 PM  Result Value Ref Range Status   Specimen Description URINE, RANDOM  Final   Special Requests NONE  Final   Culture NO GROWTH  Final   Report Status 11/17/2016 FINAL  Final  Respiratory Panel by PCR     Status: Abnormal   Collection Time: 11/17/16  9:44 AM  Result Value Ref Range Status   Adenovirus NOT DETECTED NOT DETECTED Final   Coronavirus 229E NOT DETECTED NOT DETECTED Final   Coronavirus HKU1 NOT  DETECTED NOT DETECTED Final   Coronavirus NL63 NOT DETECTED NOT DETECTED Final   Coronavirus OC43 NOT DETECTED NOT DETECTED Final   Metapneumovirus NOT DETECTED NOT DETECTED Final   Rhinovirus / Enterovirus NOT DETECTED NOT DETECTED Final   Influenza A H3 DETECTED (A) NOT DETECTED Final  Influenza B NOT DETECTED NOT DETECTED Final   Parainfluenza Virus 1 NOT DETECTED NOT DETECTED Final   Parainfluenza Virus 2 NOT DETECTED NOT DETECTED Final   Parainfluenza Virus 3 NOT DETECTED NOT DETECTED Final   Parainfluenza Virus 4 NOT DETECTED NOT DETECTED Final   Respiratory Syncytial Virus NOT DETECTED NOT DETECTED Final   Bordetella pertussis NOT DETECTED NOT DETECTED Final   Chlamydophila pneumoniae NOT DETECTED NOT DETECTED Final   Mycoplasma pneumoniae NOT DETECTED NOT DETECTED Final    Radiology Studies: Dg Chest 2 View Result Date: 11/16/2016 No active cardiopulmonary disease.   Ct Chest Wo Contrast Result Date: 11/16/2016 Trace right-sided pleural fluid. Somewhat nodular right-sided airspace opacity, predominantly at the right upper lobe, may reflect a mild infectious process. 2. Small hiatal hernia noted. 3. Diffuse coronary artery calcifications seen.   Dg Shoulder Left Result Date: 11/16/2016 Negative.    Scheduled Meds: . apixaban  5 mg Oral BID  . azithromycin  500 mg Intravenous Q24H  . cefTRIAXone (ROCEPHIN)  IV  1 g Intravenous Q24H  . famotidine  20 mg Oral Daily  . oseltamivir  30 mg Oral BID  . pravastatin  10 mg Oral QPM  . sodium chloride flush  3 mL Intravenous Q12H  . vitamin C  500 mg Oral Daily   Continuous Infusions:    LOS: 3 days   Time spent: 20 minutes   Faye Ramsay, MD [facility] Pager [phone]  If 7PM-7AM, please contact night-coverage [email or web address] Password [email or web address] 11/19/2016, 11:32 AM

## 2016-11-19 NOTE — Progress Notes (Signed)
Physical Therapy Treatment Patient Details Name: [name] MRN: [id number] DOB: [birth year] Today's Date: 11/19/2016    History of Present Illness Pt is a 83 y.o. male admitted after several days duration of progressive weakness and several falls in the past 24 hours.PMH: atrial fibrillation, pacemaker, HOH, HTN, CHF, CAD, dementia.     PT Comments    Pt presented supine in bed with HOB elevated, awake and willing to participate in therapy session. Pt continuing to require mod A for bed mobility and transfers. Pt would continue to benefit from skilled physical therapy services at this time while admitted and after d/c to address his limitations in order to improve his overall safety and independence with functional mobility.   Follow Up Recommendations  SNF;Supervision/Assistance - 24 hour     Equipment Recommendations  None recommended by PT    Recommendations for Other Services       Precautions / Restrictions Precautions Precautions: Fall Precaution Comments: L shoulder and knee pain Restrictions Weight Bearing Restrictions: No    Mobility  Bed Mobility Overal bed mobility: Needs Assistance Bed Mobility: Supine to Sit     Supine to sit: Mod assist     General bed mobility comments: HOB elevated, using rail and physical assist to come to sitting EOB. Assist also needed with pad to get hips to EOB.   Transfers Overall transfer level: Needs assistance Equipment used: Rolling walker (2 wheeled) Transfers: Sit to/from [facility] Sit to Stand: Mod assist Stand pivot transfers: Mod assist       General transfer comment: pt required increased time, good hand placement and mod A to rise and for movement of RW with pivotal movement  Ambulation/Gait                 Stairs            Wheelchair Mobility    Modified Rankin (Stroke Patients Only)       Balance Overall balance assessment: Needs assistance Sitting-balance support:  Feet supported;No upper extremity supported Sitting balance-Leahy Scale: Fair     Standing balance support: Bilateral upper extremity supported Standing balance-Leahy Scale: Poor Standing balance comment: pt reliant on bilateral UEs on RW                    Cognition Arousal/Alertness: Awake/alert Behavior During Therapy: WFL for tasks assessed/performed Overall Cognitive Status: No family/caregiver present to determine baseline cognitive functioning Area of Impairment: Memory;Following commands     Memory: Decreased short-term memory Following Commands: Follows one step commands consistently;Follows one step commands with increased time            Exercises      General Comments        Pertinent Vitals/Pain Pain Assessment: Faces Faces Pain Scale: Hurts little more Pain Location: L shoulder Pain Descriptors / Indicators: Grimacing;Guarding;Moaning Pain Intervention(s): Monitored during session;Repositioned    Home Living                      Prior Function            PT Goals (current goals can now be found in the care plan section) Acute Rehab PT Goals Patient Stated Goal: not expressed PT Goal Formulation: With patient Time For Goal Achievement: 12/01/16 Potential to Achieve Goals: Fair Progress towards PT goals: Progressing toward goals    Frequency    Min 3X/week      PT Plan Current plan remains appropriate  Co-evaluation             End of Session Equipment Utilized During Treatment: Gait belt Activity Tolerance: Patient limited by fatigue Patient left: in chair;with call bell/phone within reach;with chair alarm set     Time: [id number] PT Time Calculation (min) (ACUTE ONLY): 23 min  Charges:  $Therapeutic Activity: 23-37 mins                    G CodesClearnce Sorrel [place] 2016-12-01, 1:38 PM Sherie Don, [place], DPT [phone]

## 2016-11-20 DIAGNOSIS — R2689 Other abnormalities of gait and mobility: Secondary | ICD-10-CM | POA: Diagnosis not present

## 2016-11-20 DIAGNOSIS — I5042 Chronic combined systolic (congestive) and diastolic (congestive) heart failure: Secondary | ICD-10-CM | POA: Diagnosis not present

## 2016-11-20 DIAGNOSIS — R4189 Other symptoms and signs involving cognitive functions and awareness: Secondary | ICD-10-CM | POA: Diagnosis not present

## 2016-11-20 DIAGNOSIS — E785 Hyperlipidemia, unspecified: Secondary | ICD-10-CM | POA: Diagnosis not present

## 2016-11-20 DIAGNOSIS — M25572 Pain in left ankle and joints of left foot: Secondary | ICD-10-CM | POA: Diagnosis not present

## 2016-11-20 DIAGNOSIS — M25571 Pain in right ankle and joints of right foot: Secondary | ICD-10-CM | POA: Diagnosis not present

## 2016-11-20 DIAGNOSIS — M25532 Pain in left wrist: Secondary | ICD-10-CM | POA: Diagnosis not present

## 2016-11-20 DIAGNOSIS — R630 Anorexia: Secondary | ICD-10-CM | POA: Diagnosis not present

## 2016-11-20 DIAGNOSIS — E860 Dehydration: Secondary | ICD-10-CM | POA: Diagnosis not present

## 2016-11-20 DIAGNOSIS — I482 Chronic atrial fibrillation: Secondary | ICD-10-CM | POA: Diagnosis not present

## 2016-11-20 DIAGNOSIS — R296 Repeated falls: Secondary | ICD-10-CM | POA: Diagnosis not present

## 2016-11-20 DIAGNOSIS — W19XXXA Unspecified fall, initial encounter: Secondary | ICD-10-CM | POA: Diagnosis not present

## 2016-11-20 DIAGNOSIS — M6281 Muscle weakness (generalized): Secondary | ICD-10-CM | POA: Diagnosis not present

## 2016-11-20 DIAGNOSIS — J189 Pneumonia, unspecified organism: Secondary | ICD-10-CM | POA: Diagnosis not present

## 2016-11-20 DIAGNOSIS — R1319 Other dysphagia: Secondary | ICD-10-CM | POA: Diagnosis not present

## 2016-11-20 DIAGNOSIS — R278 Other lack of coordination: Secondary | ICD-10-CM | POA: Diagnosis not present

## 2016-11-20 DIAGNOSIS — M25512 Pain in left shoulder: Secondary | ICD-10-CM | POA: Diagnosis not present

## 2016-11-20 DIAGNOSIS — R531 Weakness: Secondary | ICD-10-CM | POA: Diagnosis not present

## 2016-11-20 DIAGNOSIS — I2510 Atherosclerotic heart disease of native coronary artery without angina pectoris: Secondary | ICD-10-CM | POA: Diagnosis not present

## 2016-11-20 DIAGNOSIS — M25531 Pain in right wrist: Secondary | ICD-10-CM | POA: Diagnosis not present

## 2016-11-20 DIAGNOSIS — J09X1 Influenza due to identified novel influenza A virus with pneumonia: Secondary | ICD-10-CM | POA: Diagnosis not present

## 2016-11-20 DIAGNOSIS — Z23 Encounter for immunization: Secondary | ICD-10-CM | POA: Diagnosis present

## 2016-11-20 DIAGNOSIS — M1990 Unspecified osteoarthritis, unspecified site: Secondary | ICD-10-CM | POA: Diagnosis not present

## 2016-11-20 DIAGNOSIS — G8929 Other chronic pain: Secondary | ICD-10-CM | POA: Diagnosis not present

## 2016-11-20 DIAGNOSIS — R488 Other symbolic dysfunctions: Secondary | ICD-10-CM | POA: Diagnosis not present

## 2016-11-20 DIAGNOSIS — J1108 Influenza due to unidentified influenza virus with specified pneumonia: Secondary | ICD-10-CM | POA: Diagnosis not present

## 2016-11-20 DIAGNOSIS — K219 Gastro-esophageal reflux disease without esophagitis: Secondary | ICD-10-CM | POA: Diagnosis not present

## 2016-11-20 DIAGNOSIS — I10 Essential (primary) hypertension: Secondary | ICD-10-CM | POA: Diagnosis not present

## 2016-11-20 DIAGNOSIS — E876 Hypokalemia: Secondary | ICD-10-CM | POA: Diagnosis not present

## 2016-11-20 DIAGNOSIS — J1100 Influenza due to unidentified influenza virus with unspecified type of pneumonia: Secondary | ICD-10-CM | POA: Diagnosis not present

## 2016-11-20 DIAGNOSIS — R5381 Other malaise: Secondary | ICD-10-CM | POA: Diagnosis not present

## 2016-11-20 DIAGNOSIS — J129 Viral pneumonia, unspecified: Secondary | ICD-10-CM | POA: Diagnosis not present

## 2016-11-20 DIAGNOSIS — S43005A Unspecified dislocation of left shoulder joint, initial encounter: Secondary | ICD-10-CM | POA: Diagnosis not present

## 2016-11-20 LAB — BASIC METABOLIC PANEL
Anion gap: 7 (ref 5–15)
BUN: 13 mg/dL (ref 6–20)
CALCIUM: 8.4 mg/dL — AB (ref 8.9–10.3)
CHLORIDE: 110 mmol/L (ref 101–111)
CO2: 21 mmol/L — AB (ref 22–32)
CREATININE: 0.97 mg/dL (ref 0.61–1.24)
GFR calc Af Amer: >60 mL/min (ref >60)
GFR calc non Af Amer: >60 mL/min (ref >60)
GLUCOSE: 92 mg/dL (ref 65–99)
Potassium: 3.6 mmol/L (ref 3.5–5.1)
Sodium: 138 mmol/L (ref 135–145)

## 2016-11-20 LAB — CBC
HEMATOCRIT: 38.7 % — AB (ref 39.0–52.0)
HEMOGLOBIN: 13 g/dL (ref 13.0–17.0)
MCH: 30.2 pg (ref 26.0–34.0)
MCHC: 33.6 g/dL (ref 30.0–36.0)
MCV: 89.8 fL (ref 78.0–100.0)
Platelets: 165 10*3/uL (ref 150–400)
RBC: 4.31 MIL/uL (ref 4.22–5.81)
RDW: 15.1 % (ref 11.5–15.5)
WBC: 6.1 10*3/uL (ref 4.0–10.5)

## 2016-11-20 LAB — PROCALCITONIN: Procalcitonin: <0.1 ng/mL

## 2016-11-20 MED ORDER — IPRATROPIUM-ALBUTEROL 0.5-2.5 (3) MG/3ML IN SOLN: 3.0000 mL | RESPIRATORY_TRACT | 0 refills | Status: DC | PRN

## 2016-11-20 MED ORDER — OSELTAMIVIR PHOSPHATE 30 MG PO CAPS: 30.0000 mg | ORAL_CAPSULE | Freq: Two times a day (BID) | ORAL | 0 refills | Status: DC

## 2016-11-20 NOTE — Progress Notes (Signed)
Report called to [facility]. 

## 2016-11-20 NOTE — Discharge Instructions (Signed)
Avian Influenza, Adult Avian influenza is also known as bird flu. Avian influenza is a group of viruses that occur naturally in wild and domestic birds. Avian influenza is easily spread (contagious) among birds and is deadly to them. Birds become infected when they come into contact with infected birds or contaminated surfaces. The avian influenza viruses are spread from country to country through the international poultry trade or by migrating birds. Although rare, avian influenza can cause severe illness in humans. It is also rare for there to be human-to-human transmission of avian influenza. However, influenza viruses can change, so human-to-human transmission may become more likely in the future. What are the causes? This condition is caused by infected birds that spread avian influenza through their feces, saliva, and fluids from the nose (nasal secretions). Avian influenza can then infect humans who:  Come in contact with infected birds. This can happen with a bird that is dead or alive.  Breathe in contaminated dust.  Touch contaminated surfaces. What increases the risk? This condition is more likely to develop in people who come in close contact with birds or poultry. What are the signs or symptoms? Symptoms of this condition include:  Fever.  Cough.  Sore throat.  Nausea and vomiting.  Diarrhea.  Muscle aches.  Tiredness (fatigue).  Inflammation or redness of the eyes (conjunctivitis).  Shortness of breath.  Difficulty breathing.  Pain in the abdomen.  Seizures.  Mental confusion. How is this diagnosed? This condition may be diagnosed by:  Medical history and physical exam.  Chest X-ray.  Examination of a fluid sample from your throat or nose.  Blood tests. How is this treated? This condition may be treated by:  Medicines that stop the growth of the viruses in your body (antiviral medicines).  Supportive care to relieve your symptoms. Follow these  instructions at home:  Take over-the-counter and prescription medicines only as told by your health care provider.  Use a cool mist humidifier. This makes breathing easier.  Rest as directed by your health care provider.  Drink enough fluid to keep your urine clear or pale yellow.  Cover your mouth and nose when coughing or sneezing.  Wash your hands well to prevent the viruses from spreading.  Avoid crowded areas. Stay home from work or school until directed by your health care provider. How is this prevented?  Stay home from work and school when you are sick. Not being in contact with other people will help stop the spread of illness.  Cover your mouth and nose with your arm when coughing or sneezing. This may help keep those around you from getting sick.  Wash your hands often with warm water and soap. Illnesses are often spread when a person touches something that is contaminated with germs and then touches his or her eyes, nose, or mouth.  If you think that you have been exposed to avian influenza, ask your health care provider about preventive antiviral medicines. These can help prevent infection from occurring. Contact a health care provider if:  You have a fever.  You have a skin rash.  You have new symptoms.  Your symptoms get worse.  You are urinating noticeably less or not at all.  Your symptoms do not get better with treatment. Get help right away if:  Your skin or nails turn bluish.  You have chest pain.  You have trouble breathing.  You feel like your heart is fluttering, skipping a beat, or beating faster than normal.  You  become confused.  You have chills, weakness, or light-headedness.  You develop a sudden headache.  You develop pain in your face or ear.  You have severe pain or stiffness in your neck.  You cough up blood.  You have nausea and vomiting that you cannot control. This information is not intended to replace advice given to you  by your health care provider. Make sure you discuss any questions you have with your health care provider. Document Released: 10/30/2003 Document Revised: 05/21/2016 Document Reviewed: 04/05/2016 Elsevier Interactive Patient Education  2017 [employer].

## 2016-11-20 NOTE — Discharge Summary (Signed)
Physician Discharge Summary  [name] [id number] DOB: [birth year] DOA: 11/16/2016  PCP: Hoyt Koch, MD  Admit date: 11/16/2016 Discharge date: 11/20/2016  Recommendations for Outpatient Follow-up:  1. Pt will need to follow up with PCP in 1-2 weeks post discharge 2. Please obtain BMP to evaluate electrolytes and kidney function 3. Please also check CBC to evaluate Hg and Hct levels 4. Complete therapy with Tamiflu for 3 more days  5. Pt will be discharged to [facility]   Discharge Diagnoses:  Active Problems:   Dehydration  Discharge Condition: Stable  Diet recommendation: Heart healthy diet discussed in details   Brief Narrative:  82 y.o.malewith medical history significant of HTN, HLD, atrial fib on Eliquis , dementia, presented to [facility] ED for evaluation of several days duration of progressive weakness, several falls in the past 24 hours. Please note that pt looks somnolent at the time of the admission and daughter at bedside provides most of the information. Pt's daughter reports that pt has had fevers at home some cough but mostly unproductive, has not been eating much and has fallen three times today. No reports of chest pain, no reports of unilateral weakness or dizziness, no LOC.   ED Course:Pt hemodynamically stable, VS notable for oxygen saturation 87% on RA, RR 29 bpm. CXR with no acute findings. Lactic acid >2. TRH asked to admit for further evaluation  Assessment & Plan:   Viral RUL PNA, CAP Influenza + - CT chest conformed RUL PNA, influenza +, unclear if bacterial component present as well - pt completed therapy with Zithromax and rocephin for 5 days, upon discharge, pt to complete Tamiflu therapy  - provide BD as needed - pt overall improving    Weakness, falls - likely secondary to flu, PNA and subsequent dehydration  - PT eval done, SNF recommended, daughter in agreement   Atrial fib, chronic  - continue Eliquis  Hypokalemia -  mild, supplemented and WNL this AM  Thrombocytopenia - mild, reactive, resolved   DVT prophylaxis: Eliquis Code Status: DNR Family Communication: Patient and daughter at bedside  Disposition Plan: SNF   Consultants:   None  Procedures:   None  Antimicrobials:   Zithromax 1/7 --> 1/11  Rocephin 1/7 --> 1/11  Tamiflu 1/8 -->  Procedures/Studies: Dg Chest 2 View  Result Date: 11/16/2016 CLINICAL DATA:  Fall.  Painful left shoulder. EXAM: CHEST  2 VIEW COMPARISON:  January 04, 2015 FINDINGS: Stable cardiomegaly and pacemaker. The mediastinum and hila are stable. The lungs are clear. Anterior wedging of a lower thoracic vertebral body is stable. IMPRESSION: No active cardiopulmonary disease. Electronically Signed   By: Dorise Bullion III M.D   On: 11/16/2016 16:11   Ct Chest Wo Contrast  Result Date: 11/16/2016 CLINICAL DATA:  Acute onset of nonproductive cough and fever. Dyspnea. Multiple recent falls. Initial encounter. EXAM: CT CHEST WITHOUT CONTRAST TECHNIQUE: Multidetector CT imaging of the chest was performed following the standard protocol without IV contrast. COMPARISON:  Chest radiograph performed earlier today at 3:38 p.m. FINDINGS: Cardiovascular: The heart is borderline normal in size. Diffuse coronary artery calcifications are seen. The patient is status post median sternotomy. A pacemaker is noted at the left chest wall, with a single lead ending at the right ventricle. The thoracic aorta is not well assessed without contrast. Scattered calcification is seen along the thoracic aorta and proximal great vessels. Mediastinum/Nodes: The mediastinum is otherwise unremarkable. Visualized mediastinal nodes are borderline normal in size. No pericardial effusion is  identified. The thyroid gland is unremarkable. No axillary lymphadenopathy is seen. Lungs/Pleura: Trace right-sided pleural fluid is noted. Patchy somewhat nodular right-sided airspace opacification, predominantly at  the right upper lobe, may reflect a mild infectious process. The lung bases are difficult to fully assess due to motion artifact. No pneumothorax is seen. No dominant mass is identified. Upper Abdomen: The visualized portions of the liver and spleen are unremarkable. A small hiatal hernia is noted. The visualized portions of the pancreas and adrenal glands are within normal limits. Musculoskeletal: No acute osseous abnormalities are identified. The visualized musculature is unremarkable in appearance. IMPRESSION: 1. Trace right-sided pleural fluid. Somewhat nodular right-sided airspace opacity, predominantly at the right upper lobe, may reflect a mild infectious process. 2. Small hiatal hernia noted. 3. Diffuse coronary artery calcifications seen. Electronically Signed   By: Garald Balding M.D.   On: 11/16/2016 22:54   Dg Shoulder Left  Result Date: 11/16/2016 CLINICAL DATA:  Pain after trauma EXAM: LEFT SHOULDER - 2+ VIEW COMPARISON:  None. FINDINGS: There is no evidence of fracture or dislocation. There is no evidence of arthropathy or other focal bone abnormality. Soft tissues are unremarkable. IMPRESSION: Negative. Electronically Signed   By: Dorise Bullion III M.D   On: 11/16/2016 16:12    Discharge Exam: Vitals:   11/19/16 2035 11/20/16 0437  BP: (!) 142/97 129/69  Pulse: 69 82  Resp: 18 18  Temp: 97.4 F (36.3 C)    Vitals:   11/19/16 0404 11/19/16 1210 11/19/16 2035 11/20/16 0437  BP: (!) 139/92 119/77 (!) 142/97 129/69  Pulse: 95 77 69 82  Resp: 18 18 18 18   Temp: 97.9 F (36.6 C) 97.5 F (36.4 C) 97.4 F (36.3 C)   TempSrc: Oral Oral Oral   SpO2: 95% 93% 96% 95%  Weight: 84.9 kg (187 lb 3.2 oz)   83.9 kg (184 lb 14.4 oz)  Height:        General: Pt is alert, follows commands appropriately, not in acute distress Cardiovascular: Regular rate and rhythm, S1/S2 +, no rubs, no gallops Respiratory: Clear to auscultation bilaterally, no wheezing, no crackles, no  rhonchi Abdominal: Soft, non tender, non distended, bowel sounds +, no guarding  Discharge Instructions   Allergies as of 11/20/2016   No Known Allergies     Medication List    TAKE these medications   acetaminophen 325 MG tablet Commonly known as:  TYLENOL Take 650 mg by mouth every 6 (six) hours as needed for mild pain.   apixaban 5 MG Tabs tablet Commonly known as:  ELIQUIS Take 1 tablet (5 mg total) by mouth 2 (two) times daily.   furosemide 40 MG tablet Commonly known as:  LASIX TAKE 1/2 TABLET BY MOUTH DAILY   glucosamine-chondroitin 500-400 MG tablet Take 1 tablet by mouth 2 (two) times daily.   ipratropium-albuterol 0.5-2.5 (3) MG/3ML Soln Commonly known as:  DUONEB Take 3 mLs by nebulization every 4 (four) hours as needed.   Lutein-Zeaxanthin 25-5 MG Caps Take 1 capsule by mouth daily.   nitroGLYCERIN 0.4 MG SL tablet Commonly known as:  NITROSTAT Place 1 tablet (0.4 mg total) under the tongue every 5 (five) minutes as needed.   oseltamivir 30 MG capsule Commonly known as:  TAMIFLU Take 1 capsule (30 mg total) by mouth 2 (two) times daily.   potassium chloride 10 MEQ tablet Commonly known as:  K-DUR TAKE 1 TABLET (10 MEQ TOTAL) BY MOUTH DAILY.   pravastatin 10 MG tablet Commonly known as:  PRAVACHOL TAKE 1 TABLET (10 MG TOTAL) BY MOUTH EVERY EVENING.   ranitidine 150 MG tablet Commonly known as:  ZANTAC TAKE 1 TABLET (150 MG TOTAL) BY MOUTH AT BEDTIME.   vitamin C 500 MG tablet Commonly known as:  ASCORBIC ACID Take 1 tablet (500 mg total) by mouth daily.      Follow-up Information    Hoyt Koch, MD Follow up.   Specialty:  Internal Medicine Contact information: [place] [address] [phone]            The results of significant diagnostics from this hospitalization (including imaging, microbiology, ancillary and laboratory) are listed below for reference.     Microbiology: Recent Results (from  the past 240 hour(s))  Blood Culture (routine x 2)     Status: None (Preliminary result)   Collection Time: 11/16/16  1:59 PM  Result Value Ref Range Status   Specimen Description BLOOD RIGHT ANTECUBITAL  Final   Special Requests BOTTLES DRAWN AEROBIC AND ANAEROBIC  5CC  Final   Culture NO GROWTH 3 DAYS  Final   Report Status PENDING  Incomplete  Blood Culture (routine x 2)     Status: None (Preliminary result)   Collection Time: 11/16/16  2:29 PM  Result Value Ref Range Status   Specimen Description BLOOD LEFT ANTECUBITAL  Final   Special Requests BOTTLES DRAWN AEROBIC AND ANAEROBIC  5CC  Final   Culture NO GROWTH 3 DAYS  Final   Report Status PENDING  Incomplete  Urine culture     Status: None   Collection Time: 11/16/16  2:59 PM  Result Value Ref Range Status   Specimen Description URINE, RANDOM  Final   Special Requests NONE  Final   Culture NO GROWTH  Final   Report Status 11/17/2016 FINAL  Final  Respiratory Panel by PCR     Status: Abnormal   Collection Time: 11/17/16  9:44 AM  Result Value Ref Range Status   Adenovirus NOT DETECTED NOT DETECTED Final   Coronavirus 229E NOT DETECTED NOT DETECTED Final   Coronavirus HKU1 NOT DETECTED NOT DETECTED Final   Coronavirus NL63 NOT DETECTED NOT DETECTED Final   Coronavirus OC43 NOT DETECTED NOT DETECTED Final   Metapneumovirus NOT DETECTED NOT DETECTED Final   Rhinovirus / Enterovirus NOT DETECTED NOT DETECTED Final   Influenza A H3 DETECTED (A) NOT DETECTED Final   Influenza B NOT DETECTED NOT DETECTED Final   Parainfluenza Virus 1 NOT DETECTED NOT DETECTED Final   Parainfluenza Virus 2 NOT DETECTED NOT DETECTED Final   Parainfluenza Virus 3 NOT DETECTED NOT DETECTED Final   Parainfluenza Virus 4 NOT DETECTED NOT DETECTED Final   Respiratory Syncytial Virus NOT DETECTED NOT DETECTED Final   Bordetella pertussis NOT DETECTED NOT DETECTED Final   Chlamydophila pneumoniae NOT DETECTED NOT DETECTED Final   Mycoplasma pneumoniae  NOT DETECTED NOT DETECTED Final     Labs: Basic Metabolic Panel:  Recent Labs Lab 11/16/16 1359 11/17/16 0039 11/18/16 0506 11/19/16 0506 11/20/16 0451  NA 139 137 138 138 138  K 4.2 3.6 3.3* 3.9 3.6  CL 107 107 108 111 110  CO2 25 21* 21* 20* 21*  GLUCOSE 139* 126* 102* 101* 92  BUN 15 18 17 15 13   CREATININE 1.19 1.18 1.12 0.94 0.97  CALCIUM 9.2 8.3* 8.4* 8.5* 8.4*   Liver Function Tests:  Recent Labs Lab 11/16/16 1359  AST 48*  ALT 37  ALKPHOS 108  BILITOT 1.0  PROT 6.4*  ALBUMIN 3.7   CBC:  Recent Labs Lab 11/16/16 1359 11/17/16 0039 11/18/16 0506 11/19/16 0506 11/20/16 0451  WBC 7.9 6.9 7.6 6.6 6.1  NEUTROABS 6.8  --   --   --   --   HGB 14.2 12.5* 13.3 13.5 13.0  HCT 42.5 38.6* 40.7 40.0 38.7*  MCV 90.8 91.7 92.1 90.7 89.8  PLT 161 143* 141* 147* 165    SIGNED: Time coordinating discharge: 30 minutes  MAGICK-Verlinda Slotnick, MD  [facility] 11/20/2016, 10:55 AM Pager [phone]  If 7PM-7AM, please contact night-coverage [email or web address] Password [email or web address]

## 2016-11-20 NOTE — Clinical Social Work Placement (Signed)
   CLINICAL SOCIAL WORK PLACEMENT  NOTE  Date:  11/20/2016  Patient Details  Name: [name] MRN: [id number] Date of Birth: 08-02-1917  Clinical Social Work is seeking post-discharge placement for this patient at the [place] level of care (*CSW will initial, date and re-position this form in  chart as items are completed):  Yes   Patient/family provided with [place] Work Department's list of facilities offering this level of care within the geographic area requested by the patient (or if unable, by the patient's family).  Yes   Patient/family informed of their freedom to choose among providers that offer the needed level of care, that participate in Medicare, Medicaid or managed care program needed by the patient, have an available bed and are willing to accept the patient.  Yes   Patient/family informed of [place]'s ownership interest in [facility] and [facility], as well as of the fact that they are under no obligation to receive care at these facilities.  PASRR submitted to EDS on 11/18/16     PASRR number received on 11/18/16     Existing PASRR number confirmed on       FL2 transmitted to all facilities in geographic area requested by pt/family on 11/19/16     FL2 transmitted to all facilities within larger geographic area on       Patient informed that his/her managed care company has contracts with or will negotiate with certain facilities, including the following:        Yes   Patient/family informed of bed offers received.  Patient chooses bed at [facility]     Physician recommends and patient chooses bed at      Patient to be transferred to [facility] on 11/20/16.  Patient to be transferred to facility by [facility]     Patient family notified on 11/20/16 of transfer.  Name of family member notified:  Magda Paganini Dent     PHYSICIAN       Additional Comment:     _______________________________________________ Candie Chroman, LCSW 11/20/2016, 3:15 PM

## 2016-11-20 NOTE — Clinical Social Work Note (Signed)
CSW facilitated patient discharge including contacting patient family and facility to confirm patient discharge plans. Clinical information faxed to facility and family agreeable with plan. CSW arranged ambulance transport via [facility] to [facility]. RN to call report prior to discharge ([phone]).  CSW will sign off for now as social work intervention is no longer needed. Please consult us again if new needs arise.  Cartier Mapel, CSW [phone]   

## 2016-11-20 NOTE — Progress Notes (Signed)
Patient transferred via [facility] to [facility]. Vitals stable. Patient belongings sent along. Hermina Barters Rn 11/20/2016 10:40 PM

## 2016-11-20 NOTE — Clinical Social Work Note (Addendum)
CSW met with patient. Daughter at bedside. CSW presented bed offers and patient's daughter has chosen [facility]. CSW is waiting to hear back from admissions coordinator to hear if they have a bed today.  [name], CSW [phone]  11:19 pm [facility] can take patient today. SNF staff will call and set up a time to do paperwork with patient's daughter. Patient cannot transport until paperwork completed. MD notified.  [name], [name]

## 2016-11-21 ENCOUNTER — Non-Acute Institutional Stay (SKILLED_NURSING_FACILITY): Payer: Medicare Other | Admitting: Internal Medicine

## 2016-11-21 ENCOUNTER — Telehealth: Payer: Self-pay

## 2016-11-21 ENCOUNTER — Encounter: Payer: Self-pay | Admitting: Internal Medicine

## 2016-11-21 DIAGNOSIS — I482 Chronic atrial fibrillation, unspecified: Secondary | ICD-10-CM

## 2016-11-21 DIAGNOSIS — I2510 Atherosclerotic heart disease of native coronary artery without angina pectoris: Secondary | ICD-10-CM | POA: Diagnosis not present

## 2016-11-21 DIAGNOSIS — J1100 Influenza due to unidentified influenza virus with unspecified type of pneumonia: Secondary | ICD-10-CM

## 2016-11-21 DIAGNOSIS — M25512 Pain in left shoulder: Secondary | ICD-10-CM

## 2016-11-21 DIAGNOSIS — R531 Weakness: Secondary | ICD-10-CM

## 2016-11-21 DIAGNOSIS — E785 Hyperlipidemia, unspecified: Secondary | ICD-10-CM

## 2016-11-21 DIAGNOSIS — K219 Gastro-esophageal reflux disease without esophagitis: Secondary | ICD-10-CM

## 2016-11-21 DIAGNOSIS — J189 Pneumonia, unspecified organism: Secondary | ICD-10-CM | POA: Diagnosis not present

## 2016-11-21 DIAGNOSIS — R4189 Other symptoms and signs involving cognitive functions and awareness: Secondary | ICD-10-CM

## 2016-11-21 DIAGNOSIS — I5042 Chronic combined systolic (congestive) and diastolic (congestive) heart failure: Secondary | ICD-10-CM | POA: Diagnosis not present

## 2016-11-21 NOTE — Telephone Encounter (Signed)
Pt on TCM list. Admitted on 11/16/2016 for febrile illness and dehydration. To follow up within 1-2 weeks with PCP after discharge (11/20/2016). Pt has been admitted to nursing home.

## 2016-11-21 NOTE — Progress Notes (Signed)
LOCATION: [facility]  PCP: Hoyt Koch, MD   Code Status: DNR  Goals of care: Advanced Directive information Advanced Directives 11/21/2016  Does Patient Have a Medical Advance Directive? Yes  Type of Advance Directive Out of facility DNR (pink MOST or yellow form)  Does patient want to make changes to medical advance directive? No - Patient declined  Copy of [place] in Chart? -  Would patient like information on creating a medical advance directive? -  Pre-existing out of facility DNR order (yellow form or pink MOST form) -       Extended Emergency Contact Information Primary Emergency Contact: [place] of [place] Mobile Phone: [phone] Relation: Son Secondary Emergency Contact: Dent,Leslie Address: [address]          [place], [place] [address] [place] Phone: [phone] Work Phone: [phone] Relation: Daughter   No Known Allergies  Chief Complaint  Patient presents with  . New Admit To SNF    New Admission Visit      HPI:  Patient is a 83 y.o. male seen today for short term rehabilitation post hospital admission from 11/16/2016-11/20/2016 with influenza and community-acquired pneumonia. He was started on Tamiflu, azithromycin and Rocephin. He has completed azithromycin and Rocephin 5 day course. He was noted to be deconditioned for generalized weakness. He has medical history of chronic atrial fibrillation on eliquis. He is seen in his room today.   Review of Systems:  Constitutional: Negative for fever, chills, diaphoresis.  HENT: Negative for headache, congestion, nasal discharge, sore throat, difficulty swallowing.  He is hard of hearing.  Eyes: Negative for double vision and discharge.  Respiratory: Negative for shortness of breath and wheezing. Positive for cough with yellow phlegm.   Cardiovascular: Negative for chest pain, palpitations.  Gastrointestinal: Negative for  heartburn, nausea, vomiting, abdominal pain. Last bowel movement was yesterday. Genitourinary: Negative for dysuria.  Musculoskeletal: Positive for lower back pain. He is a high fall risk.   Skin: Negative for itching, rash.  Neurological: Negative for dizziness. Psychiatric/Behavioral: Negative for depression.    Past Medical History:  Diagnosis Date  . Adenocarcinoma of prostate ([place]) 10/2005   lupron injections q75mo  . Arthritis    ?rheumatoid, never dx or on DMARDs  . Atrial fibrillation (HCC)    chronic anticoag  . Barrett's esophagus   . CAD (coronary artery disease)    CABG 08/1973  . CHF (congestive heart failure) ([place])    ICM  . Chronic renal insufficiency   . Diverticulosis of colon   . GERD (gastroesophageal reflux disease)   . Hyperlipidemia   . Hypertension   . Memory loss   . Osteoporosis   . Pacemaker 09/2003   RESULTS:  This demonstrates successful implantation of a Medtronic dual  . Paroxysmal atrial fibrillation ([place])   . Unsteady gait    due to B ankle DJD and instability   Past Surgical History:  Procedure Laterality Date  . CORONARY ARTERY BYPASS GRAFT  08/1973   3 heart heart arteries were 80 percent clogged. the lower valve of my heart is not functioning  . DOPPLER ECHOCARDIOGRAPHY  2004, 2008  . HERNIA REPAIR     age 51 ( not sure if it was right or left side)  . PACEMAKER GENERATOR CHANGE N/A 05/02/2013   Procedure: PACEMAKER GENERATOR CHANGE;  Surgeon: Evans Lance, MD;  Location: [facility] C[facility] LAB;  Service: Cardiovascular;  Laterality: N/A;  . PACEMAKER  PLACEMENT  09/2003   Social History:   reports that he quit smoking about 30 years ago. He has never used smokeless tobacco. He reports that he drinks alcohol. His drug history is not on file.  Family History  Problem Relation Age of Onset  . Cancer Mother   . Heart attack Father     Medications: Allergies as of 11/21/2016   No Known Allergies     Medication List       Accurate as of  11/21/16 11:46 AM. Always use your most recent med list.          acetaminophen 325 MG tablet Commonly known as:  TYLENOL Take 650 mg by mouth every 6 (six) hours as needed for mild pain.   apixaban 5 MG Tabs tablet Commonly known as:  ELIQUIS Take 1 tablet (5 mg total) by mouth 2 (two) times daily.   furosemide 40 MG tablet Commonly known as:  LASIX TAKE 1/2 TABLET BY MOUTH DAILY   glucosamine-chondroitin 500-400 MG tablet Take 1 tablet by mouth 2 (two) times daily.   ipratropium-albuterol 0.5-2.5 (3) MG/3ML Soln Commonly known as:  DUONEB Take 3 mLs by nebulization every 4 (four) hours as needed.   Lutein-Zeaxanthin 25-5 MG Caps Take 1 capsule by mouth daily.   nitroGLYCERIN 0.4 MG SL tablet Commonly known as:  NITROSTAT Place 1 tablet (0.4 mg total) under the tongue every 5 (five) minutes as needed.   oseltamivir 30 MG capsule Commonly known as:  TAMIFLU Take 1 capsule (30 mg total) by mouth 2 (two) times daily.   potassium chloride 10 MEQ tablet Commonly known as:  K-DUR TAKE 1 TABLET (10 MEQ TOTAL) BY MOUTH DAILY.   pravastatin 10 MG tablet Commonly known as:  PRAVACHOL TAKE 1 TABLET (10 MG TOTAL) BY MOUTH EVERY EVENING.   ranitidine 150 MG tablet Commonly known as:  ZANTAC TAKE 1 TABLET (150 MG TOTAL) BY MOUTH AT BEDTIME.   vitamin C 500 MG tablet Commonly known as:  ASCORBIC ACID Take 1 tablet (500 mg total) by mouth daily.       Immunizations: Immunization History  Administered Date(s) Administered  . Influenza Split 08/06/2011  . Influenza Whole 07/13/2007, 07/31/2008, 08/28/2009, 08/12/2010  . Influenza,inj,Quad PF,36+ Mos 07/06/2013  . Influenza-Unspecified 08/03/2014, 09/07/2015  . Pneumococcal Polysaccharide-23 07/13/2007  . Tdap 03/16/2014, 11/16/2016     Physical Exam: Vitals:   11/21/16 1143  BP: 119/74  Pulse: 76  Resp: 16  Temp: 97.8 F (36.6 C)  TempSrc: Oral  SpO2: 93%  Weight: 184 lb 14.4 oz (83.9 kg)  Height: 5\' 7"   (1.702 m)   Body mass index is 28.96 kg/m.  General- elderly male, overweight, in no acute distress Head- normocephalic, atraumatic, hearing aid Nose- no nasal discharge Throat- moist mucus membrane Eyes- PERRLA, EOMI, no pallor, no icterus, no discharge, normal conjunctiva, normal sclera Neck- no cervical lymphadenopathy Cardiovascular- normal s1,s2, no murmur Respiratory- bilateral clear to auscultation, no wheeze, no rhonchi, no crackles, no use of accessory muscles Abdomen- bowel sounds present, soft, non tender Musculoskeletal- able to move all 4 extremities,  generalized weakness, trace leg edema, limited left shoulder ROM, arthritis changes to her fingers neurological- alert and oriented to self and place Skin- warm and dry, multiple bruising Psychiatry- normal mood and affect    Labs reviewed: Basic Metabolic Panel:  Recent Labs  11/18/16 0506 11/19/16 0506 11/20/16 0451  NA 138 138 138  K 3.3* 3.9 3.6  CL 108 111 110  CO2 21*  20* 21*  GLUCOSE 102* 101* 92  BUN 17 15 13   CREATININE 1.12 0.94 0.97  CALCIUM 8.4* 8.5* 8.4*   Liver Function Tests:  Recent Labs  11/16/16 1359  AST 48*  ALT 37  ALKPHOS 108  BILITOT 1.0  PROT 6.4*  ALBUMIN 3.7   No results for input(s): LIPASE, AMYLASE in the last 8760 hours. No results for input(s): AMMONIA in the last 8760 hours. CBC:  Recent Labs  11/16/16 1359  11/18/16 0506 11/19/16 0506 11/20/16 0451  WBC 7.9  < > 7.6 6.6 6.1  NEUTROABS 6.8  --   --   --   --   HGB 14.2  < > 13.3 13.5 13.0  HCT 42.5  < > 40.7 40.0 38.7*  MCV 90.8  < > 92.1 90.7 89.8  PLT 161  < > 141* 147* 165  < > = values in this interval not displayed.   Radiological Exams: Dg Chest 2 View  Result Date: 11/16/2016 CLINICAL DATA:  Fall.  Painful left shoulder. EXAM: CHEST  2 VIEW COMPARISON:  January 04, 2015 FINDINGS: Stable cardiomegaly and pacemaker. The mediastinum and hila are stable. The lungs are clear. Anterior wedging of a  lower thoracic vertebral body is stable. IMPRESSION: No active cardiopulmonary disease. Electronically Signed   By: Dorise Bullion III M.D   On: 11/16/2016 16:11   Ct Chest Wo Contrast  Result Date: 11/16/2016 CLINICAL DATA:  Acute onset of nonproductive cough and fever. Dyspnea. Multiple recent falls. Initial encounter. EXAM: CT CHEST WITHOUT CONTRAST TECHNIQUE: Multidetector CT imaging of the chest was performed following the standard protocol without IV contrast. COMPARISON:  Chest radiograph performed earlier today at 3:38 p.m. FINDINGS: Cardiovascular: The heart is borderline normal in size. Diffuse coronary artery calcifications are seen. The patient is status post median sternotomy. A pacemaker is noted at the left chest wall, with a single lead ending at the right ventricle. The thoracic aorta is not well assessed without contrast. Scattered calcification is seen along the thoracic aorta and proximal great vessels. Mediastinum/Nodes: The mediastinum is otherwise unremarkable. Visualized mediastinal nodes are borderline normal in size. No pericardial effusion is identified. The thyroid gland is unremarkable. No axillary lymphadenopathy is seen. Lungs/Pleura: Trace right-sided pleural fluid is noted. Patchy somewhat nodular right-sided airspace opacification, predominantly at the right upper lobe, may reflect a mild infectious process. The lung bases are difficult to fully assess due to motion artifact. No pneumothorax is seen. No dominant mass is identified. Upper Abdomen: The visualized portions of the liver and spleen are unremarkable. A small hiatal hernia is noted. The visualized portions of the pancreas and adrenal glands are within normal limits. Musculoskeletal: No acute osseous abnormalities are identified. The visualized musculature is unremarkable in appearance. IMPRESSION: 1. Trace right-sided pleural fluid. Somewhat nodular right-sided airspace opacity, predominantly at the right upper lobe,  may reflect a mild infectious process. 2. Small hiatal hernia noted. 3. Diffuse coronary artery calcifications seen. Electronically Signed   By: Garald Balding M.D.   On: 11/16/2016 22:54   Dg Shoulder Left  Result Date: 11/16/2016 CLINICAL DATA:  Pain after trauma EXAM: LEFT SHOULDER - 2+ VIEW COMPARISON:  None. FINDINGS: There is no evidence of fracture or dislocation. There is no evidence of arthropathy or other focal bone abnormality. Soft tissues are unremarkable. IMPRESSION: Negative. Electronically Signed   By: Dorise Bullion III M.D   On: 11/16/2016 16:12     Assessment/Plan  Generalized weakness From deconditioning. Here for rehabilitation. Will  have him work with physical therapy and occupational therapy to help regain his strength and balance.  Influenza pneumonia Continue and complete course of Tamiflu on 11/23/2016. Breathing has been stable. Monitor clinically. Continue to nap when necessary.  Community-acquired pneumonia Has completed antibiotic course in the hospital. Remains afebrile. Breathing currently stable. Monitor  Left shoulder pain Reviewed x-ray results negative. Continue Tylenol 650 mg every 6 hours as needed for pain. PMR consult.   Chronic atrial fibrillation Rate is controlled. Continue eliquis 5 mg twice a day for anticoagulation.   Chronic combined CHF Continue Lasix 20 mg daily and potassium supplement. Monitor BMP.  Cognitive impairment Speech therapy to follow. Supportive care to be provided.  Gastroesophageal reflux disease Continue Zantac 150 mg daily.  Hyperlipidemia Continue pravastatin 10 mg daily.  Coronary arthrosclerosis Remains chest pain-free. Continue when necessary nitroglycerin, daily statin and monitor.     Goals of care: short term rehabilitation   Labs/tests ordered:cbc, cmp 11/24/16  Family/ staff Communication: reviewed care plan with patient and nursing supervisor    Blanchie Serve, MD Internal Medicine [place], [place] [address] Cell Phone (Monday-Friday 8 am - 5 pm): [phone] On Call: [phone] and follow prompts after 5 pm and on weekends Office Phone: [phone] Office Fax: [phone]

## 2016-11-22 LAB — CULTURE, BLOOD (ROUTINE X 2)
CULTURE: NO GROWTH
Culture: NO GROWTH

## 2016-11-24 LAB — CBC AND DIFFERENTIAL
HEMATOCRIT: 44 % (ref 41–53)
HEMOGLOBIN: 14.9 g/dL (ref 13.5–17.5)
NEUTROS ABS: 5 /uL
PLATELETS: 239 10*3/uL (ref 150–399)
WBC: 8.5 10^3/mL

## 2016-11-24 LAB — HEPATIC FUNCTION PANEL
ALT: 21 U/L (ref 10–40)
AST: 31 U/L (ref 14–40)
Alkaline Phosphatase: 115 U/L (ref 25–125)
BILIRUBIN, TOTAL: 1.4 mg/dL

## 2016-11-24 LAB — BASIC METABOLIC PANEL
BUN: 13 mg/dL (ref 4–21)
Creatinine: 0.9 mg/dL (ref 0.6–1.3)
Glucose: 97 mg/dL
Potassium: 4 mmol/L (ref 3.4–5.3)
Sodium: 142 mmol/L (ref 137–147)

## 2016-11-25 DIAGNOSIS — M1990 Unspecified osteoarthritis, unspecified site: Secondary | ICD-10-CM | POA: Diagnosis not present

## 2016-11-25 DIAGNOSIS — M25572 Pain in left ankle and joints of left foot: Secondary | ICD-10-CM | POA: Diagnosis not present

## 2016-11-25 DIAGNOSIS — R5381 Other malaise: Secondary | ICD-10-CM | POA: Diagnosis not present

## 2016-11-25 DIAGNOSIS — M25512 Pain in left shoulder: Secondary | ICD-10-CM | POA: Diagnosis not present

## 2016-11-25 DIAGNOSIS — M25571 Pain in right ankle and joints of right foot: Secondary | ICD-10-CM | POA: Diagnosis not present

## 2016-11-25 DIAGNOSIS — I482 Chronic atrial fibrillation: Secondary | ICD-10-CM | POA: Diagnosis not present

## 2016-11-25 DIAGNOSIS — M25531 Pain in right wrist: Secondary | ICD-10-CM | POA: Diagnosis not present

## 2016-11-25 DIAGNOSIS — M25532 Pain in left wrist: Secondary | ICD-10-CM | POA: Diagnosis not present

## 2016-12-01 DIAGNOSIS — M25572 Pain in left ankle and joints of left foot: Secondary | ICD-10-CM | POA: Diagnosis not present

## 2016-12-01 DIAGNOSIS — M1990 Unspecified osteoarthritis, unspecified site: Secondary | ICD-10-CM | POA: Diagnosis not present

## 2016-12-01 DIAGNOSIS — M25531 Pain in right wrist: Secondary | ICD-10-CM | POA: Diagnosis not present

## 2016-12-01 DIAGNOSIS — M25532 Pain in left wrist: Secondary | ICD-10-CM | POA: Diagnosis not present

## 2016-12-01 DIAGNOSIS — M25512 Pain in left shoulder: Secondary | ICD-10-CM | POA: Diagnosis not present

## 2016-12-01 DIAGNOSIS — I482 Chronic atrial fibrillation: Secondary | ICD-10-CM | POA: Diagnosis not present

## 2016-12-01 DIAGNOSIS — R5381 Other malaise: Secondary | ICD-10-CM | POA: Diagnosis not present

## 2016-12-01 DIAGNOSIS — M25571 Pain in right ankle and joints of right foot: Secondary | ICD-10-CM | POA: Diagnosis not present

## 2016-12-04 DIAGNOSIS — M25572 Pain in left ankle and joints of left foot: Secondary | ICD-10-CM | POA: Diagnosis not present

## 2016-12-04 DIAGNOSIS — M25571 Pain in right ankle and joints of right foot: Secondary | ICD-10-CM | POA: Diagnosis not present

## 2016-12-04 DIAGNOSIS — M1990 Unspecified osteoarthritis, unspecified site: Secondary | ICD-10-CM | POA: Diagnosis not present

## 2016-12-04 DIAGNOSIS — M25532 Pain in left wrist: Secondary | ICD-10-CM | POA: Diagnosis not present

## 2016-12-04 DIAGNOSIS — R5381 Other malaise: Secondary | ICD-10-CM | POA: Diagnosis not present

## 2016-12-04 DIAGNOSIS — M25512 Pain in left shoulder: Secondary | ICD-10-CM | POA: Diagnosis not present

## 2016-12-04 DIAGNOSIS — M25531 Pain in right wrist: Secondary | ICD-10-CM | POA: Diagnosis not present

## 2016-12-04 DIAGNOSIS — I482 Chronic atrial fibrillation: Secondary | ICD-10-CM | POA: Diagnosis not present

## 2016-12-05 DIAGNOSIS — M25571 Pain in right ankle and joints of right foot: Secondary | ICD-10-CM | POA: Diagnosis not present

## 2016-12-05 DIAGNOSIS — M25572 Pain in left ankle and joints of left foot: Secondary | ICD-10-CM | POA: Diagnosis not present

## 2016-12-05 DIAGNOSIS — I482 Chronic atrial fibrillation: Secondary | ICD-10-CM | POA: Diagnosis not present

## 2016-12-05 DIAGNOSIS — M25531 Pain in right wrist: Secondary | ICD-10-CM | POA: Diagnosis not present

## 2016-12-05 DIAGNOSIS — M1990 Unspecified osteoarthritis, unspecified site: Secondary | ICD-10-CM | POA: Diagnosis not present

## 2016-12-05 DIAGNOSIS — M25532 Pain in left wrist: Secondary | ICD-10-CM | POA: Diagnosis not present

## 2016-12-05 DIAGNOSIS — R5381 Other malaise: Secondary | ICD-10-CM | POA: Diagnosis not present

## 2016-12-05 DIAGNOSIS — M25512 Pain in left shoulder: Secondary | ICD-10-CM | POA: Diagnosis not present

## 2016-12-11 DIAGNOSIS — M25572 Pain in left ankle and joints of left foot: Secondary | ICD-10-CM | POA: Diagnosis not present

## 2016-12-11 DIAGNOSIS — I482 Chronic atrial fibrillation: Secondary | ICD-10-CM | POA: Diagnosis not present

## 2016-12-11 DIAGNOSIS — M1990 Unspecified osteoarthritis, unspecified site: Secondary | ICD-10-CM | POA: Diagnosis not present

## 2016-12-11 DIAGNOSIS — R5381 Other malaise: Secondary | ICD-10-CM | POA: Diagnosis not present

## 2016-12-11 DIAGNOSIS — M25531 Pain in right wrist: Secondary | ICD-10-CM | POA: Diagnosis not present

## 2016-12-11 DIAGNOSIS — M25512 Pain in left shoulder: Secondary | ICD-10-CM | POA: Diagnosis not present

## 2016-12-11 DIAGNOSIS — M25532 Pain in left wrist: Secondary | ICD-10-CM | POA: Diagnosis not present

## 2016-12-11 DIAGNOSIS — M25571 Pain in right ankle and joints of right foot: Secondary | ICD-10-CM | POA: Diagnosis not present

## 2016-12-16 DIAGNOSIS — M1990 Unspecified osteoarthritis, unspecified site: Secondary | ICD-10-CM | POA: Diagnosis not present

## 2016-12-16 DIAGNOSIS — R5381 Other malaise: Secondary | ICD-10-CM | POA: Diagnosis not present

## 2016-12-16 DIAGNOSIS — M25512 Pain in left shoulder: Secondary | ICD-10-CM | POA: Diagnosis not present

## 2016-12-16 DIAGNOSIS — M25532 Pain in left wrist: Secondary | ICD-10-CM | POA: Diagnosis not present

## 2016-12-16 DIAGNOSIS — M25572 Pain in left ankle and joints of left foot: Secondary | ICD-10-CM | POA: Diagnosis not present

## 2016-12-16 DIAGNOSIS — M25571 Pain in right ankle and joints of right foot: Secondary | ICD-10-CM | POA: Diagnosis not present

## 2016-12-16 DIAGNOSIS — M25531 Pain in right wrist: Secondary | ICD-10-CM | POA: Diagnosis not present

## 2016-12-16 DIAGNOSIS — I482 Chronic atrial fibrillation: Secondary | ICD-10-CM | POA: Diagnosis not present

## 2016-12-30 ENCOUNTER — Encounter: Payer: Self-pay | Admitting: Adult Health

## 2016-12-30 ENCOUNTER — Non-Acute Institutional Stay (SKILLED_NURSING_FACILITY): Payer: Medicare Other | Admitting: Adult Health

## 2016-12-30 DIAGNOSIS — R630 Anorexia: Secondary | ICD-10-CM | POA: Diagnosis not present

## 2016-12-30 DIAGNOSIS — I2510 Atherosclerotic heart disease of native coronary artery without angina pectoris: Secondary | ICD-10-CM | POA: Diagnosis not present

## 2016-12-30 DIAGNOSIS — J189 Pneumonia, unspecified organism: Secondary | ICD-10-CM | POA: Diagnosis not present

## 2016-12-30 DIAGNOSIS — E785 Hyperlipidemia, unspecified: Secondary | ICD-10-CM

## 2016-12-30 DIAGNOSIS — J1100 Influenza due to unidentified influenza virus with unspecified type of pneumonia: Secondary | ICD-10-CM

## 2016-12-30 DIAGNOSIS — E876 Hypokalemia: Secondary | ICD-10-CM | POA: Diagnosis not present

## 2016-12-30 DIAGNOSIS — I482 Chronic atrial fibrillation, unspecified: Secondary | ICD-10-CM

## 2016-12-30 DIAGNOSIS — I5042 Chronic combined systolic (congestive) and diastolic (congestive) heart failure: Secondary | ICD-10-CM | POA: Diagnosis not present

## 2016-12-30 DIAGNOSIS — R531 Weakness: Secondary | ICD-10-CM | POA: Diagnosis not present

## 2016-12-30 DIAGNOSIS — G8929 Other chronic pain: Secondary | ICD-10-CM

## 2016-12-30 DIAGNOSIS — K219 Gastro-esophageal reflux disease without esophagitis: Secondary | ICD-10-CM | POA: Diagnosis not present

## 2016-12-30 DIAGNOSIS — M25512 Pain in left shoulder: Secondary | ICD-10-CM | POA: Diagnosis not present

## 2016-12-30 NOTE — Progress Notes (Signed)
DATE:  12/30/2016   MRN:  [id number]  BIRTHDAY: [birth year]  Facility:  [facility] Location:  [place] [facility] [place] Room Number: 808-P  LEVEL OF CARE:  SNF [phone])  Contact Information    Name Relation Home Work Mobile   Kiener,Scott Son   [phone]   Dent,Leslie Daughter [phone]       Code Status History    Date Active Date Inactive Code Status Order ID Comments User Context   11/16/2016  6:33 PM 11/21/2016  1:46 AM DNR [id number]  Theodis Blaze, MD Inpatient   11/16/2016  1:46 PM 11/16/2016  6:33 PM DNR [id number]  Orlie Dakin, MD ED   11/04/2014  3:39 PM 11/06/2014  9:00 PM Full Code [id number]  Erline Hau, MD ED    Questions for Most Recent Historical Code Status (Order [id number])    Question Answer Comment   In the event of cardiac or respiratory ARREST Do not call a "code blue"    In the event of cardiac or respiratory ARREST Do not perform Intubation, CPR, defibrillation or ACLS    In the event of cardiac or respiratory ARREST Use medication by any route, position, wound care, and other measures to relive pain and suffering. May use oxygen, suction and manual treatment of airway obstruction as needed for comfort.         Advance Directive Documentation   Flowsheet Row Most Recent Value  Type of Advance Directive  Out of facility DNR (pink MOST or yellow form)  Pre-existing out of facility DNR order (yellow form or pink MOST form)  No data  "MOST" Form in Place?  No data       Chief Complaint  Patient presents with  . Discharge Note    HISTORY OF PRESENT ILLNESS:  This is a 89-YO male seen for a discharge visit.  He will discharge to home on 02-22-202018 with home health PT, OT, CNA, Nursing, and Social Work services.   He has been admitted to [facility] on 11/20/16 from [facility] admission dates 11/16/16 thru 11/20/16 with influenza and community-acquired pneumonia. He  was started on Tamiflu, azithromycin and Rocephin. He has PMH of chronic atrial fibrillation on Eliquis.  Patient was admitted to this facility for short-term rehabilitation after the patient's recent hospitalization.  Patient has completed SNF rehabilitation and therapy has cleared the patient for discharge.   PAST MEDICAL HISTORY:  Past Medical History:  Diagnosis Date  . Adenocarcinoma of prostate ([place]) 10/2005   lupron injections q37mo  . Arthritis    ?rheumatoid, never dx or on DMARDs  . Atrial fibrillation (HCC)    chronic anticoag  . Barrett's esophagus   . CAD (coronary artery disease)    CABG 08/1973  . CHF (congestive heart failure) ([place])    ICM  . Chronic renal insufficiency   . Diverticulosis of colon   . GERD (gastroesophageal reflux disease)   . Hyperlipidemia   . Hypertension   . Memory loss   . Osteoporosis   . Pacemaker 09/2003   RESULTS:  This demonstrates successful implantation of a Medtronic dual  . Paroxysmal atrial fibrillation ([place])   . Unsteady gait    due to B ankle DJD and instability     CURRENT MEDICATIONS: Reviewed  Patient's Medications  New Prescriptions   No medications on file  Previous Medications   ACETAMINOPHEN (TYLENOL) 325 MG TABLET    Take 650 mg  by mouth every 6 (six) hours as needed for mild pain.   APIXABAN (ELIQUIS) 5 MG TABS TABLET    Take 1 tablet (5 mg total) by mouth 2 (two) times daily.   ATORVASTATIN (LIPITOR) 10 MG TABLET    Take 10 mg by mouth daily.   FUROSEMIDE (LASIX) 20 MG TABLET    Take 20 mg by mouth daily.   GLUCOSAMINE-CHONDROITIN 500-400 MG TABLET    Take 1 tablet by mouth 2 (two) times daily.   IPRATROPIUM-ALBUTEROL (DUONEB) 0.5-2.5 (3) MG/3ML SOLN    Take 3 mLs by nebulization every 4 (four) hours as needed.   LUTEIN-ZEAXANTHIN 25-5 MG CAPS    Take 1 capsule by mouth daily.   MULTIPLE VITAMINS-MINERALS (ELDERTONIC PO)    Take 15 mLs by mouth 2 (two) times daily.   NITROGLYCERIN (NITROSTAT) 0.4 MG SL TABLET     Place 1 tablet (0.4 mg total) under the tongue every 5 (five) minutes as needed.   NUTRITIONAL SUPPLEMENT LIQD    Take 120 mLs by mouth. [employer]   POTASSIUM CHLORIDE (K-DUR) 10 MEQ TABLET    TAKE 1 TABLET (10 MEQ TOTAL) BY MOUTH DAILY.   RANITIDINE (ZANTAC) 150 MG TABLET    TAKE 1 TABLET (150 MG TOTAL) BY MOUTH AT BEDTIME.   VITAMIN C (ASCORBIC ACID) 500 MG TABLET    Take 1 tablet (500 mg total) by mouth daily.  Modified Medications   No medications on file  Discontinued Medications   FUROSEMIDE (LASIX) 40 MG TABLET    TAKE 1/2 TABLET BY MOUTH DAILY   OSELTAMIVIR (TAMIFLU) 30 MG CAPSULE    Take 1 capsule (30 mg total) by mouth 2 (two) times daily.   PRAVASTATIN (PRAVACHOL) 10 MG TABLET    TAKE 1 TABLET (10 MG TOTAL) BY MOUTH EVERY EVENING.     No Known Allergies   REVIEW OF SYSTEMS:  GENERAL: no change in appetite, no fatigue, no weight changes, no fever, chills or weakness EYES: Denies change in vision, dry eyes, eye pain, itching or discharge EARS: Denies ringing in ears, or earache NOSE: Denies nasal congestion or epistaxis MOUTH and THROAT: Denies oral discomfort, gingival pain or bleeding, pain from teeth or hoarseness   RESPIRATORY: no cough, SOB, DOE, wheezing, hemoptysis CARDIAC: no chest pain, edema or palpitations GI: no abdominal pain, diarrhea, constipation, heart burn, nausea or vomiting GU: Denies dysuria, frequency, hematuria, incontinence, or discharge PSYCHIATRIC: Denies feeling of depression or anxiety. No report of hallucinations, insomnia, paranoia, or agitation   PHYSICAL EXAMINATION  GENERAL APPEARANCE: Well nourished. In no acute distress. Normal body habitus SKIN:  Skin is warm and dry.  HEAD: Normal in size and contour. No evidence of trauma EYES: Lids open and close normally. No blepharitis, entropion or ectropion. PERRL. Conjunctivae are clear and sclerae are white. Lenses are without opacity EARS: Pinnae are normal. Wears bilateral hearing aids MOUTH  and THROAT: Lips are without lesions. Oral mucosa is moist and without lesions. Tongue is normal in shape, size, and color and without lesions NECK: supple, trachea midline, no neck masses, no thyroid tenderness, no thyromegaly LYMPHATICS: no LAN in the neck, no supraclavicular LAN RESPIRATORY: breathing is even & unlabored, BS CTAB CARDIAC: RRR, + murmur,no extra heart sounds, no edema GI: abdomen soft, normal BS, no masses, no tenderness, no hepatomegaly, no splenomegaly EXTREMITIES:  Able to move X 4 extremities PSYCHIATRIC: Alert to self, confused to time and place. Affect and behavior are appropriate   LABS/RADIOLOGY: Labs reviewed: Basic Metabolic  Panel:  Recent Labs  11/18/16 0506 11/19/16 0506 11/20/16 0451 11/24/16  NA 138 138 138 142  K 3.3* 3.9 3.6 4.0  CL 108 111 110  --   CO2 21* 20* 21*  --   GLUCOSE 102* 101* 92  --   BUN 17 15 13 13   CREATININE 1.12 0.94 0.97 0.9  CALCIUM 8.4* 8.5* 8.4*  --    Liver Function Tests:  Recent Labs  11/16/16 1359 11/24/16  AST 48* 31  ALT 37 21  ALKPHOS 108 115  BILITOT 1.0  --   PROT 6.4*  --   ALBUMIN 3.7  --    CBC:  Recent Labs  11/16/16 1359  11/18/16 0506 11/19/16 0506 11/20/16 0451 11/24/16  WBC 7.9  < > 7.6 6.6 6.1 8.5  NEUTROABS 6.8  --   --   --   --  5  HGB 14.2  < > 13.3 13.5 13.0 14.9  HCT 42.5  < > 40.7 40.0 38.7* 44  MCV 90.8  < > 92.1 90.7 89.8  --   PLT 161  < > 141* 147* 165 239  < > = values in this interval not displayed.  ASSESSMENT/PLAN:  Generalized weakness - for Home health PT and OT for therapeutic strengthening exercises; fall precautions  Influenza Pneumonia - completed course of Tamiflu, resolved  Community acquired pneumonia - has completed course of antibiotics, resolved  Left shoulder pain - followed by physiatry in-house, continue Tylenol 325 mg 2 tabs = 650 mg by mouth every 6 hours when necessary for pain  Poor appetite - was started on Eldertonic 15 mL by mouth twice a  day  CAD - no chest pain; continue NTG when necessary, Lipitor 10 mg 1 tab by mouth daily  Chronic atrial fibrillation - rate controlled; continue Eliquis 5 mg 1 tab by mouth twice a day  Chronic combined systolic and diastolic CHF - no SOB; continue Lasix 20 mg 1 tab by mouth daily  Hypokalemia - continue KCl ER 10 MEQ 1 tab by mouth daily  GERD - continue Zantac 150 mg 1 tab by mouth daily at bedtime  Hyperlipidemia - continue Lipitor 10 mg 1 tab by mouth daily      I have filled out patient's discharge paperwork and written prescriptions.  Patient will receive home health PT, OT, Social Worker, Nursing and CNA.   DME provided: Rolling walker    Total discharge time: Greater than 30 minutes Greater than 50% was spent in counseling and coordination of care.  Discharge time involved coordination of the discharge process with social worker, nursing staff and therapy department. Medical justification for home health services/DME verified.    Cinde Ebert C. [place] - NP    [employer] [phone]

## 2017-01-05 DIAGNOSIS — Z8701 Personal history of pneumonia (recurrent): Secondary | ICD-10-CM | POA: Diagnosis not present

## 2017-01-05 DIAGNOSIS — I110 Hypertensive heart disease with heart failure: Secondary | ICD-10-CM | POA: Diagnosis not present

## 2017-01-05 DIAGNOSIS — I2510 Atherosclerotic heart disease of native coronary artery without angina pectoris: Secondary | ICD-10-CM | POA: Diagnosis not present

## 2017-01-05 DIAGNOSIS — I482 Chronic atrial fibrillation: Secondary | ICD-10-CM | POA: Diagnosis not present

## 2017-01-05 DIAGNOSIS — Z8546 Personal history of malignant neoplasm of prostate: Secondary | ICD-10-CM | POA: Diagnosis not present

## 2017-01-05 DIAGNOSIS — Z950 Presence of cardiac pacemaker: Secondary | ICD-10-CM | POA: Diagnosis not present

## 2017-01-05 DIAGNOSIS — I509 Heart failure, unspecified: Secondary | ICD-10-CM | POA: Diagnosis not present

## 2017-01-05 DIAGNOSIS — Z7901 Long term (current) use of anticoagulants: Secondary | ICD-10-CM | POA: Diagnosis not present

## 2017-01-05 DIAGNOSIS — Z9181 History of falling: Secondary | ICD-10-CM | POA: Diagnosis not present

## 2017-01-05 DIAGNOSIS — M25512 Pain in left shoulder: Secondary | ICD-10-CM | POA: Diagnosis not present

## 2017-01-05 DIAGNOSIS — J09X1 Influenza due to identified novel influenza A virus with pneumonia: Secondary | ICD-10-CM | POA: Diagnosis not present

## 2017-01-05 DIAGNOSIS — K2270 Barrett's esophagus without dysplasia: Secondary | ICD-10-CM | POA: Diagnosis not present

## 2017-01-06 ENCOUNTER — Telehealth: Payer: Self-pay | Admitting: Internal Medicine

## 2017-01-06 DIAGNOSIS — K2270 Barrett's esophagus without dysplasia: Secondary | ICD-10-CM | POA: Diagnosis not present

## 2017-01-06 DIAGNOSIS — I482 Chronic atrial fibrillation: Secondary | ICD-10-CM | POA: Diagnosis not present

## 2017-01-06 DIAGNOSIS — I509 Heart failure, unspecified: Secondary | ICD-10-CM | POA: Diagnosis not present

## 2017-01-06 DIAGNOSIS — J09X1 Influenza due to identified novel influenza A virus with pneumonia: Secondary | ICD-10-CM | POA: Diagnosis not present

## 2017-01-06 DIAGNOSIS — I2510 Atherosclerotic heart disease of native coronary artery without angina pectoris: Secondary | ICD-10-CM | POA: Diagnosis not present

## 2017-01-06 DIAGNOSIS — I110 Hypertensive heart disease with heart failure: Secondary | ICD-10-CM | POA: Diagnosis not present

## 2017-01-06 NOTE — Telephone Encounter (Signed)
Verbals given  

## 2017-01-06 NOTE — Telephone Encounter (Signed)
Verbal Orders for PT 2x a week for 2 1x a week for 5weeks  [phone] - Clair Gulling

## 2017-01-06 NOTE — Telephone Encounter (Signed)
Fine

## 2017-01-07 ENCOUNTER — Ambulatory Visit (INDEPENDENT_AMBULATORY_CARE_PROVIDER_SITE_OTHER): Payer: Medicare Other | Admitting: Internal Medicine

## 2017-01-07 ENCOUNTER — Telehealth: Payer: Self-pay | Admitting: *Deleted

## 2017-01-07 ENCOUNTER — Encounter: Payer: Self-pay | Admitting: Internal Medicine

## 2017-01-07 DIAGNOSIS — I2510 Atherosclerotic heart disease of native coronary artery without angina pectoris: Secondary | ICD-10-CM | POA: Diagnosis not present

## 2017-01-07 DIAGNOSIS — R269 Unspecified abnormalities of gait and mobility: Secondary | ICD-10-CM

## 2017-01-07 DIAGNOSIS — I5042 Chronic combined systolic (congestive) and diastolic (congestive) heart failure: Secondary | ICD-10-CM

## 2017-01-07 DIAGNOSIS — E785 Hyperlipidemia, unspecified: Secondary | ICD-10-CM | POA: Diagnosis not present

## 2017-01-07 DIAGNOSIS — K2270 Barrett's esophagus without dysplasia: Secondary | ICD-10-CM | POA: Diagnosis not present

## 2017-01-07 DIAGNOSIS — I10 Essential (primary) hypertension: Secondary | ICD-10-CM | POA: Diagnosis not present

## 2017-01-07 NOTE — Telephone Encounter (Signed)
Rec'd call from pt daughter stating just saw MD this morning fail to ask MD to rx a appetitie stimulant for dad. Can send to [facility]...[name]

## 2017-01-07 NOTE — Progress Notes (Signed)
   Subjective:    Patient ID: [name], male    DOB: May 17, 1917, 83 y.o.   MRN: [id number]  HPI The patient is an 83 YO man coming in for hospital and rehab follow up (in with severe dehydration and flu illness, with AKI which resolved in the hospital, did some rehab at [facility]). He is still in need of assisted living facility and is moving into [facility] with his wife (also had flu then pneumonia and is recovering as well). He is breathing better but still extreme SOB with exertion. Denies chest pains, abdominal pains, diarrhea, constipation. No new problems. Flu symptoms and congestion is resolved. No new pain in his joints.   PMH, [facility], social history reviewed and updated.   Review of Systems  Constitutional: Positive for activity change, appetite change and fatigue. Negative for chills, fever and unexpected weight change.  HENT: Negative.   Eyes: Negative.   Respiratory: Positive for shortness of breath. Negative for cough, choking, chest tightness, wheezing and stridor.   Cardiovascular: Negative.   Gastrointestinal: Negative.   Musculoskeletal: Positive for arthralgias. Negative for back pain, gait problem, joint swelling and myalgias.  Skin: Negative.   Neurological: Positive for weakness. Negative for dizziness, facial asymmetry, light-headedness and headaches.  Psychiatric/Behavioral: Negative.       Objective:   Physical Exam  Constitutional: He is oriented to person, place, and time. He appears well-developed and well-nourished.  HENT:  Head: Normocephalic and atraumatic.  Right Ear: External ear normal.  Left Ear: External ear normal.  Eyes: EOM are normal.  Neck: Normal range of motion. No JVD present.  Cardiovascular: Normal rate and regular rhythm.   Pulmonary/Chest: Effort normal and breath sounds normal. No respiratory distress. He has no wheezes. He has no rales.  Abdominal: Soft.  Musculoskeletal: He exhibits no edema.  Lymphadenopathy:    He has  no cervical adenopathy.  Neurological: He is alert and oriented to person, place, and time. Coordination normal.  Skin: Skin is warm and dry.  Psychiatric: He has a normal mood and affect.   Vitals:   01/07/17 0902  BP: 120/60  Pulse: 90  Temp: 98.2 F (36.8 C)  TempSrc: Oral  Weight: 157 lb 9 oz (71.5 kg)  Height: 5\' 7"  (1.702 m)      Assessment & Plan:

## 2017-01-07 NOTE — Patient Instructions (Signed)
We have given you the forms today and please feel free to call us if you need anything else.

## 2017-01-07 NOTE — Progress Notes (Signed)
Pre visit review using our clinic review tool, if applicable. No additional management support is needed unless otherwise documented below in the visit note. 

## 2017-01-08 DIAGNOSIS — I509 Heart failure, unspecified: Secondary | ICD-10-CM | POA: Diagnosis not present

## 2017-01-08 DIAGNOSIS — I482 Chronic atrial fibrillation: Secondary | ICD-10-CM | POA: Diagnosis not present

## 2017-01-08 DIAGNOSIS — K2270 Barrett's esophagus without dysplasia: Secondary | ICD-10-CM | POA: Diagnosis not present

## 2017-01-08 DIAGNOSIS — I2510 Atherosclerotic heart disease of native coronary artery without angina pectoris: Secondary | ICD-10-CM | POA: Diagnosis not present

## 2017-01-08 DIAGNOSIS — I110 Hypertensive heart disease with heart failure: Secondary | ICD-10-CM | POA: Diagnosis not present

## 2017-01-08 DIAGNOSIS — J09X1 Influenza due to identified novel influenza A virus with pneumonia: Secondary | ICD-10-CM | POA: Diagnosis not present

## 2017-01-08 MED ORDER — MIRTAZAPINE 15 MG PO TABS: 15.0000 mg | ORAL_TABLET | Freq: Every day | ORAL | 3 refills | Status: DC

## 2017-01-08 NOTE — Telephone Encounter (Signed)
Notified daughter ready for pick-up...Johny Chess

## 2017-01-08 NOTE — Assessment & Plan Note (Signed)
Deconditioned with recent severe flu illness. He is gong to need more PT to recover some of his stamina and energy.

## 2017-01-08 NOTE — Telephone Encounter (Signed)
Sent in remeron to take a night time.

## 2017-01-08 NOTE — Assessment & Plan Note (Signed)
No flare today, taking his lasix and potassium. He is deconditioned which is causing his SOB with exertion at this time. Weight is stable and no fluid on exam.

## 2017-01-08 NOTE — Assessment & Plan Note (Signed)
BP at goal on lasix, recent BMP without indication to adjust.

## 2017-01-08 NOTE — Assessment & Plan Note (Signed)
Taking ranitidine at this time with control of symptoms.

## 2017-01-08 NOTE — Assessment & Plan Note (Signed)
Stopped statin due to change in goals to preservation of QOL and this was giving myalgias.

## 2017-01-13 DIAGNOSIS — I110 Hypertensive heart disease with heart failure: Secondary | ICD-10-CM | POA: Diagnosis not present

## 2017-01-13 DIAGNOSIS — I509 Heart failure, unspecified: Secondary | ICD-10-CM | POA: Diagnosis not present

## 2017-01-13 DIAGNOSIS — I2510 Atherosclerotic heart disease of native coronary artery without angina pectoris: Secondary | ICD-10-CM | POA: Diagnosis not present

## 2017-01-13 DIAGNOSIS — I482 Chronic atrial fibrillation: Secondary | ICD-10-CM | POA: Diagnosis not present

## 2017-01-13 DIAGNOSIS — K2270 Barrett's esophagus without dysplasia: Secondary | ICD-10-CM | POA: Diagnosis not present

## 2017-01-13 DIAGNOSIS — J09X1 Influenza due to identified novel influenza A virus with pneumonia: Secondary | ICD-10-CM | POA: Diagnosis not present

## 2017-01-14 DIAGNOSIS — I509 Heart failure, unspecified: Secondary | ICD-10-CM | POA: Diagnosis not present

## 2017-01-14 DIAGNOSIS — I110 Hypertensive heart disease with heart failure: Secondary | ICD-10-CM | POA: Diagnosis not present

## 2017-01-14 DIAGNOSIS — J09X1 Influenza due to identified novel influenza A virus with pneumonia: Secondary | ICD-10-CM | POA: Diagnosis not present

## 2017-01-14 DIAGNOSIS — I2510 Atherosclerotic heart disease of native coronary artery without angina pectoris: Secondary | ICD-10-CM | POA: Diagnosis not present

## 2017-01-14 DIAGNOSIS — I482 Chronic atrial fibrillation: Secondary | ICD-10-CM | POA: Diagnosis not present

## 2017-01-14 DIAGNOSIS — K2270 Barrett's esophagus without dysplasia: Secondary | ICD-10-CM | POA: Diagnosis not present

## 2017-01-16 DIAGNOSIS — I482 Chronic atrial fibrillation: Secondary | ICD-10-CM | POA: Diagnosis not present

## 2017-01-16 DIAGNOSIS — I110 Hypertensive heart disease with heart failure: Secondary | ICD-10-CM | POA: Diagnosis not present

## 2017-01-16 DIAGNOSIS — I2510 Atherosclerotic heart disease of native coronary artery without angina pectoris: Secondary | ICD-10-CM | POA: Diagnosis not present

## 2017-01-16 DIAGNOSIS — K2270 Barrett's esophagus without dysplasia: Secondary | ICD-10-CM | POA: Diagnosis not present

## 2017-01-16 DIAGNOSIS — J09X1 Influenza due to identified novel influenza A virus with pneumonia: Secondary | ICD-10-CM | POA: Diagnosis not present

## 2017-01-16 DIAGNOSIS — I509 Heart failure, unspecified: Secondary | ICD-10-CM | POA: Diagnosis not present

## 2017-01-19 ENCOUNTER — Telehealth: Payer: Self-pay | Admitting: Internal Medicine

## 2017-01-19 DIAGNOSIS — I482 Chronic atrial fibrillation: Secondary | ICD-10-CM | POA: Diagnosis not present

## 2017-01-19 DIAGNOSIS — Z9181 History of falling: Secondary | ICD-10-CM | POA: Diagnosis not present

## 2017-01-19 DIAGNOSIS — Z8546 Personal history of malignant neoplasm of prostate: Secondary | ICD-10-CM | POA: Diagnosis not present

## 2017-01-19 DIAGNOSIS — I509 Heart failure, unspecified: Secondary | ICD-10-CM | POA: Diagnosis not present

## 2017-01-19 DIAGNOSIS — Z7901 Long term (current) use of anticoagulants: Secondary | ICD-10-CM | POA: Diagnosis not present

## 2017-01-19 DIAGNOSIS — M25512 Pain in left shoulder: Secondary | ICD-10-CM | POA: Diagnosis not present

## 2017-01-19 DIAGNOSIS — I110 Hypertensive heart disease with heart failure: Secondary | ICD-10-CM | POA: Diagnosis not present

## 2017-01-19 DIAGNOSIS — K2270 Barrett's esophagus without dysplasia: Secondary | ICD-10-CM | POA: Diagnosis not present

## 2017-01-19 DIAGNOSIS — J09X1 Influenza due to identified novel influenza A virus with pneumonia: Secondary | ICD-10-CM | POA: Diagnosis not present

## 2017-01-19 DIAGNOSIS — Z950 Presence of cardiac pacemaker: Secondary | ICD-10-CM | POA: Diagnosis not present

## 2017-01-19 DIAGNOSIS — I2510 Atherosclerotic heart disease of native coronary artery without angina pectoris: Secondary | ICD-10-CM | POA: Diagnosis not present

## 2017-01-19 NOTE — Telephone Encounter (Signed)
Patients daughter dropped off paperwork to be filled out.

## 2017-01-21 ENCOUNTER — Encounter: Payer: Self-pay | Admitting: Podiatry

## 2017-01-21 ENCOUNTER — Ambulatory Visit (INDEPENDENT_AMBULATORY_CARE_PROVIDER_SITE_OTHER): Payer: Medicare Other | Admitting: Podiatry

## 2017-01-21 VITALS — Ht 67.0 in | Wt 157.0 lb

## 2017-01-21 DIAGNOSIS — I2510 Atherosclerotic heart disease of native coronary artery without angina pectoris: Secondary | ICD-10-CM | POA: Diagnosis not present

## 2017-01-21 DIAGNOSIS — M79676 Pain in unspecified toe(s): Secondary | ICD-10-CM

## 2017-01-21 DIAGNOSIS — B351 Tinea unguium: Secondary | ICD-10-CM | POA: Diagnosis not present

## 2017-01-21 DIAGNOSIS — I509 Heart failure, unspecified: Secondary | ICD-10-CM | POA: Diagnosis not present

## 2017-01-21 DIAGNOSIS — I482 Chronic atrial fibrillation: Secondary | ICD-10-CM | POA: Diagnosis not present

## 2017-01-21 DIAGNOSIS — K2270 Barrett's esophagus without dysplasia: Secondary | ICD-10-CM | POA: Diagnosis not present

## 2017-01-21 DIAGNOSIS — I110 Hypertensive heart disease with heart failure: Secondary | ICD-10-CM | POA: Diagnosis not present

## 2017-01-21 DIAGNOSIS — J09X1 Influenza due to identified novel influenza A virus with pneumonia: Secondary | ICD-10-CM | POA: Diagnosis not present

## 2017-01-21 NOTE — Progress Notes (Signed)
Patient ID: ROBER SKEELS, male   DOB: August 18, 1917, 83 y.o.   MRN: [id number] Complaint:  Visit Type: Patient returns to my office for continued preventative foot care services. Complaint: Patient states" my nails have grown long and thick and become painful to walk and wear shoes" . The patient presents for preventative foot care services. No changes to ROS  Podiatric Exam: Vascular: dorsalis pedis and posterior tibial pulses are not  palpable bilateral. Capillary return is immediate. Temperature gradient is WNL. Skin turgor WNL  Sensorium: Normal Semmes Weinstein monofilament test. Normal tactile sensation bilaterally. Nail Exam: Pt has thick disfigured discolored nails with subungual debris noted bilateral entire nail hallux through fifth toenails Ulcer Exam: There is no evidence of ulcer or pre-ulcerative changes or infection. Orthopedic Exam: Muscle tone and strength are WNL. No limitations in general ROM. No crepitus or effusions noted. Foot type and digits show no abnormalities. Bony prominences are unremarkable. Skin: No Porokeratosis. No infection or ulcers  Diagnosis:  Onychomycosis, , Pain in right toe, pain in left toes  Treatment & Plan Procedures and Treatment: Consent by patient was obtained for treatment procedures. The patient understood the discussion of treatment and procedures well. All questions were answered thoroughly reviewed. Debridement of mycotic and hypertrophic toenails, 1 through 5 bilateral and clearing of subungual debris. No ulceration, no infection noted.  Return Visit-Office Procedure: Patient instructed to return to the office for a follow up visit 3 months for continued evaluation and treatment.   Gardiner Barefoot DPM

## 2017-01-22 DIAGNOSIS — I482 Chronic atrial fibrillation: Secondary | ICD-10-CM | POA: Diagnosis not present

## 2017-01-22 DIAGNOSIS — I110 Hypertensive heart disease with heart failure: Secondary | ICD-10-CM | POA: Diagnosis not present

## 2017-01-22 DIAGNOSIS — K2270 Barrett's esophagus without dysplasia: Secondary | ICD-10-CM | POA: Diagnosis not present

## 2017-01-22 DIAGNOSIS — I509 Heart failure, unspecified: Secondary | ICD-10-CM | POA: Diagnosis not present

## 2017-01-22 DIAGNOSIS — I2510 Atherosclerotic heart disease of native coronary artery without angina pectoris: Secondary | ICD-10-CM | POA: Diagnosis not present

## 2017-01-22 DIAGNOSIS — J09X1 Influenza due to identified novel influenza A virus with pneumonia: Secondary | ICD-10-CM | POA: Diagnosis not present

## 2017-01-26 ENCOUNTER — Telehealth: Payer: Self-pay | Admitting: Internal Medicine

## 2017-01-26 DIAGNOSIS — K2270 Barrett's esophagus without dysplasia: Secondary | ICD-10-CM | POA: Diagnosis not present

## 2017-01-26 DIAGNOSIS — I509 Heart failure, unspecified: Secondary | ICD-10-CM | POA: Diagnosis not present

## 2017-01-26 DIAGNOSIS — I482 Chronic atrial fibrillation: Secondary | ICD-10-CM | POA: Diagnosis not present

## 2017-01-26 DIAGNOSIS — I2510 Atherosclerotic heart disease of native coronary artery without angina pectoris: Secondary | ICD-10-CM | POA: Diagnosis not present

## 2017-01-26 DIAGNOSIS — I110 Hypertensive heart disease with heart failure: Secondary | ICD-10-CM | POA: Diagnosis not present

## 2017-01-26 DIAGNOSIS — J09X1 Influenza due to identified novel influenza A virus with pneumonia: Secondary | ICD-10-CM | POA: Diagnosis not present

## 2017-01-26 NOTE — Telephone Encounter (Signed)
Okay to give. 

## 2017-01-26 NOTE — Telephone Encounter (Signed)
Given

## 2017-01-26 NOTE — Telephone Encounter (Signed)
[facility] therapists would like verbal orders  for 2 week 2 and 1 week 1  Best # [phone]

## 2017-01-27 DIAGNOSIS — K2270 Barrett's esophagus without dysplasia: Secondary | ICD-10-CM | POA: Diagnosis not present

## 2017-01-27 DIAGNOSIS — I2510 Atherosclerotic heart disease of native coronary artery without angina pectoris: Secondary | ICD-10-CM | POA: Diagnosis not present

## 2017-01-27 DIAGNOSIS — I110 Hypertensive heart disease with heart failure: Secondary | ICD-10-CM | POA: Diagnosis not present

## 2017-01-27 DIAGNOSIS — I509 Heart failure, unspecified: Secondary | ICD-10-CM | POA: Diagnosis not present

## 2017-01-27 DIAGNOSIS — J09X1 Influenza due to identified novel influenza A virus with pneumonia: Secondary | ICD-10-CM | POA: Diagnosis not present

## 2017-01-27 DIAGNOSIS — I482 Chronic atrial fibrillation: Secondary | ICD-10-CM | POA: Diagnosis not present

## 2017-01-28 DIAGNOSIS — I110 Hypertensive heart disease with heart failure: Secondary | ICD-10-CM | POA: Diagnosis not present

## 2017-01-28 DIAGNOSIS — K2270 Barrett's esophagus without dysplasia: Secondary | ICD-10-CM | POA: Diagnosis not present

## 2017-01-28 DIAGNOSIS — I2510 Atherosclerotic heart disease of native coronary artery without angina pectoris: Secondary | ICD-10-CM | POA: Diagnosis not present

## 2017-01-28 DIAGNOSIS — I509 Heart failure, unspecified: Secondary | ICD-10-CM | POA: Diagnosis not present

## 2017-01-28 DIAGNOSIS — J09X1 Influenza due to identified novel influenza A virus with pneumonia: Secondary | ICD-10-CM | POA: Diagnosis not present

## 2017-01-28 DIAGNOSIS — I482 Chronic atrial fibrillation: Secondary | ICD-10-CM | POA: Diagnosis not present

## 2017-01-29 DIAGNOSIS — I110 Hypertensive heart disease with heart failure: Secondary | ICD-10-CM | POA: Diagnosis not present

## 2017-01-29 DIAGNOSIS — K2270 Barrett's esophagus without dysplasia: Secondary | ICD-10-CM | POA: Diagnosis not present

## 2017-01-29 DIAGNOSIS — I2510 Atherosclerotic heart disease of native coronary artery without angina pectoris: Secondary | ICD-10-CM | POA: Diagnosis not present

## 2017-01-29 DIAGNOSIS — I482 Chronic atrial fibrillation: Secondary | ICD-10-CM | POA: Diagnosis not present

## 2017-01-29 DIAGNOSIS — I509 Heart failure, unspecified: Secondary | ICD-10-CM | POA: Diagnosis not present

## 2017-01-29 DIAGNOSIS — J09X1 Influenza due to identified novel influenza A virus with pneumonia: Secondary | ICD-10-CM | POA: Diagnosis not present

## 2017-02-03 ENCOUNTER — Ambulatory Visit (INDEPENDENT_AMBULATORY_CARE_PROVIDER_SITE_OTHER): Payer: Medicare Other | Admitting: Internal Medicine

## 2017-02-03 ENCOUNTER — Encounter: Payer: Self-pay | Admitting: Internal Medicine

## 2017-02-03 VITALS — BP 104/64 | HR 65 | Ht 67.0 in | Wt 168.0 lb

## 2017-02-03 DIAGNOSIS — I480 Paroxysmal atrial fibrillation: Secondary | ICD-10-CM | POA: Diagnosis not present

## 2017-02-03 DIAGNOSIS — I482 Chronic atrial fibrillation: Secondary | ICD-10-CM | POA: Diagnosis not present

## 2017-02-03 DIAGNOSIS — I2510 Atherosclerotic heart disease of native coronary artery without angina pectoris: Secondary | ICD-10-CM

## 2017-02-03 DIAGNOSIS — J09X1 Influenza due to identified novel influenza A virus with pneumonia: Secondary | ICD-10-CM | POA: Diagnosis not present

## 2017-02-03 DIAGNOSIS — I509 Heart failure, unspecified: Secondary | ICD-10-CM | POA: Diagnosis not present

## 2017-02-03 DIAGNOSIS — I110 Hypertensive heart disease with heart failure: Secondary | ICD-10-CM | POA: Diagnosis not present

## 2017-02-03 DIAGNOSIS — K2270 Barrett's esophagus without dysplasia: Secondary | ICD-10-CM | POA: Diagnosis not present

## 2017-02-03 NOTE — Patient Instructions (Signed)
Medication Instructions:    Your physician recommends that you continue on your current medications as directed. Please refer to the Current Medication list given to you today.  --- If you need a refill on your cardiac medications before your next appointment, please call your pharmacy. ---  Labwork:  None ordered  Testing/Procedures:  None ordered  Follow-Up: Remote monitoring is used to monitor your Pacemaker of ICD from home. This monitoring reduces the number of office visits required to check your device to one time per year. It allows us to keep an eye on the functioning of your device to ensure it is working properly. You are scheduled for a device check from home on 05/05/17. You may send your transmission at any time that day. If you have a wireless device, the transmission will be sent automatically. After your physician reviews your transmission, you will receive a postcard with your next transmission date.   Your physician wants you to follow-up in: 1 year with Dr. Taylor.  You will receive a reminder letter in the mail two months in advance. If you don't receive a letter, please call our office to schedule the follow-up appointment.  Thank you for choosing [facility]!!      

## 2017-02-03 NOTE — Progress Notes (Signed)
HPI [name] returns today for followup. He is a very pleasant 83 year old man with a history of symptomatic bradycardia, status post permanent pacemaker insertion. He also is a history of hypertension, coronary disease, and prostate cancer. In the interim, he has had influenza but eventually improved. He has moved to a SNF.  No Known Allergies   Current Outpatient Prescriptions  Medication Sig Dispense Refill  . acetaminophen (TYLENOL) 325 MG tablet Take 650 mg by mouth every 6 (six) hours as needed for mild pain.    Marland Kitchen apixaban (ELIQUIS) 5 MG TABS tablet Take 1 tablet (5 mg total) by mouth 2 (two) times daily. 180 tablet 3  . atorvastatin (LIPITOR) 10 MG tablet Take 10 mg by mouth daily.    . furosemide (LASIX) 20 MG tablet Take 20 mg by mouth daily.    . Lutein-Zeaxanthin 25-5 MG CAPS Take 1 capsule by mouth daily.    . mirtazapine (REMERON) 15 MG tablet Take 1 tablet (15 mg total) by mouth at bedtime. 30 tablet 3  . nitroGLYCERIN (NITROSTAT) 0.4 MG SL tablet Place 1 tablet (0.4 mg total) under the tongue every 5 (five) minutes as needed. 25 tablet 3  . potassium chloride (K-DUR) 10 MEQ tablet TAKE 1 TABLET (10 MEQ TOTAL) BY MOUTH DAILY. 90 tablet 3  . ranitidine (ZANTAC) 150 MG tablet TAKE 1 TABLET (150 MG TOTAL) BY MOUTH AT BEDTIME. 30 tablet 10  . vitamin C (ASCORBIC ACID) 500 MG tablet Take 1 tablet (500 mg total) by mouth daily.     No current facility-administered medications for this visit.      Past Medical History:  Diagnosis Date  . Adenocarcinoma of prostate ([place]) 10/2005   lupron injections q70mo  . Arthritis    ?rheumatoid, never dx or on DMARDs  . Atrial fibrillation ([facility])    chronic anticoag  . Barrett's esophagus   . CAD (coronary artery disease)    CABG 08/1973  . CHF (congestive heart failure) ([place])    ICM  . Chronic renal insufficiency   . Diverticulosis of colon   . GERD (gastroesophageal reflux disease)   . Hyperlipidemia   . Hypertension   .  Memory loss   . Osteoporosis   . Pacemaker 09/2003   RESULTS:  This demonstrates successful implantation of a Medtronic dual  . Paroxysmal atrial fibrillation ([place])   . Unsteady gait    due to B ankle DJD and instability    ROS:   All systems reviewed and negative except as noted in the HPI.   Past Surgical History:  Procedure Laterality Date  . CORONARY ARTERY BYPASS GRAFT  08/1973   3 heart heart arteries were 80 percent clogged. the lower valve of my heart is not functioning  . DOPPLER ECHOCARDIOGRAPHY  2004, 2008  . HERNIA REPAIR     age 39 ( not sure if it was right or left side)  . PACEMAKER GENERATOR CHANGE N/A 05/02/2013   Procedure: PACEMAKER GENERATOR CHANGE;  Surgeon: Paul Lance, MD;  Location: [facility] CATH LAB;  Service: Cardiovascular;  Laterality: N/A;  . PACEMAKER PLACEMENT  09/2003     Family History  Problem Relation Age of Onset  . Cancer Mother   . Heart attack Father      Social History   Social History  . Marital status: Married    Spouse name: N/A  . Number of children: N/A  . Years of education: N/A   Occupational History  .  Not on file.   Social History Main Topics  . Smoking status: Former Smoker    Quit date: 08/05/1986  . Smokeless tobacco: Never Used  . Alcohol use Yes     Comment: samll glass of wine  . Drug use: Unknown  . Sexual activity: Not on file   Other Topics Concern  . Not on file   Social History Narrative   Married  - lives with spouse and supportive daugher   Former Smoker -  quit tobacco 25 years ago     Alcohol use-yes (small glass of wine)                    BP 104/64   Pulse 65   Ht 5\' 7"  (1.702 m)   Wt 168 lb (76.2 kg)   BMI 26.31 kg/m   Physical Exam:  Well appearing elderly man, NAD HEENT: Unremarkable Neck:  No JVD, no thyromegally Lymphatics:  No adenopathy Back:  No CVA tenderness Lungs:  Clear, except for rare basilar rales. No wheezes or rhonchi. Well-healed pacemaker incision. HEART:   Regular rate rhythm, no murmurs, no rubs, no clicks Abd:  soft, positive bowel sounds, no organomegally, no rebound, no guarding Ext:  2 plus pulses, no edema, no cyanosis, no clubbing Skin:  No rashes no nodules Neuro:  CN II through XII intact, motor grossly intact   DEVICE  Normal device function.  See PaceArt for details.   Assess/Plan: 1. Atrial fib - he has PAF on his PPM interogation. Because he has not fallen in over 2 years and his CHADSVASC is over 6, I have recommended he continue anti-coagulation. Will continue eliquis. 2. HTN - his blood pressure is well controlled. No change in meds. 3. Sinus node dysfunction - he is asymptomatic and paces less than 20/% of the time. 4. PPM - his Medtronic DDD PM is working normally. Will recheck in several months.  [name].D.

## 2017-02-05 DIAGNOSIS — J09X1 Influenza due to identified novel influenza A virus with pneumonia: Secondary | ICD-10-CM | POA: Diagnosis not present

## 2017-02-05 DIAGNOSIS — I110 Hypertensive heart disease with heart failure: Secondary | ICD-10-CM | POA: Diagnosis not present

## 2017-02-05 DIAGNOSIS — I2510 Atherosclerotic heart disease of native coronary artery without angina pectoris: Secondary | ICD-10-CM | POA: Diagnosis not present

## 2017-02-05 DIAGNOSIS — I482 Chronic atrial fibrillation: Secondary | ICD-10-CM | POA: Diagnosis not present

## 2017-02-05 DIAGNOSIS — K2270 Barrett's esophagus without dysplasia: Secondary | ICD-10-CM | POA: Diagnosis not present

## 2017-02-05 DIAGNOSIS — I509 Heart failure, unspecified: Secondary | ICD-10-CM | POA: Diagnosis not present

## 2017-02-05 LAB — CUP PACEART INCLINIC DEVICE CHECK
Brady Statistic AP VS Percent: 11 %
Brady Statistic AS VP Percent: 3 %
Brady Statistic AS VS Percent: 80 %
Date Time Interrogation Session: 20180327185530
Implantable Lead Implant Date: 20041129
Implantable Lead Implant Date: 20041129
Implantable Lead Location: 753860
Lead Channel Impedance Value: 560 Ohm
Lead Channel Pacing Threshold Pulse Width: 0.4 ms
Lead Channel Sensing Intrinsic Amplitude: 0.5 mV
Lead Channel Sensing Intrinsic Amplitude: 5.6 mV
MDC IDC LEAD LOCATION: 753859
MDC IDC MSMT BATTERY IMPEDANCE: 318 Ohm
MDC IDC MSMT BATTERY REMAINING LONGEVITY: 97 mo
MDC IDC MSMT BATTERY VOLTAGE: 2.79 V
MDC IDC MSMT LEADCHNL RA IMPEDANCE VALUE: 454 Ohm
MDC IDC MSMT LEADCHNL RA PACING THRESHOLD AMPLITUDE: 0.875 V
MDC IDC MSMT LEADCHNL RV PACING THRESHOLD AMPLITUDE: 1 V
MDC IDC MSMT LEADCHNL RV PACING THRESHOLD PULSEWIDTH: 0.4 ms
MDC IDC PG IMPLANT DT: 20140623
MDC IDC SET LEADCHNL RA PACING AMPLITUDE: 2 V
MDC IDC SET LEADCHNL RV PACING AMPLITUDE: 2.5 V
MDC IDC SET LEADCHNL RV PACING PULSEWIDTH: 0.4 ms
MDC IDC SET LEADCHNL RV SENSING SENSITIVITY: 2.8 mV
MDC IDC STAT BRADY AP VP PERCENT: 6 %

## 2017-02-06 DIAGNOSIS — I110 Hypertensive heart disease with heart failure: Secondary | ICD-10-CM | POA: Diagnosis not present

## 2017-02-06 DIAGNOSIS — I509 Heart failure, unspecified: Secondary | ICD-10-CM | POA: Diagnosis not present

## 2017-02-06 DIAGNOSIS — K2270 Barrett's esophagus without dysplasia: Secondary | ICD-10-CM | POA: Diagnosis not present

## 2017-02-06 DIAGNOSIS — I2510 Atherosclerotic heart disease of native coronary artery without angina pectoris: Secondary | ICD-10-CM | POA: Diagnosis not present

## 2017-02-06 DIAGNOSIS — I482 Chronic atrial fibrillation: Secondary | ICD-10-CM | POA: Diagnosis not present

## 2017-02-06 DIAGNOSIS — J09X1 Influenza due to identified novel influenza A virus with pneumonia: Secondary | ICD-10-CM | POA: Diagnosis not present

## 2017-02-10 DIAGNOSIS — I2510 Atherosclerotic heart disease of native coronary artery without angina pectoris: Secondary | ICD-10-CM | POA: Diagnosis not present

## 2017-02-10 DIAGNOSIS — J09X1 Influenza due to identified novel influenza A virus with pneumonia: Secondary | ICD-10-CM | POA: Diagnosis not present

## 2017-02-10 DIAGNOSIS — I110 Hypertensive heart disease with heart failure: Secondary | ICD-10-CM | POA: Diagnosis not present

## 2017-02-10 DIAGNOSIS — I509 Heart failure, unspecified: Secondary | ICD-10-CM | POA: Diagnosis not present

## 2017-02-10 DIAGNOSIS — I482 Chronic atrial fibrillation: Secondary | ICD-10-CM | POA: Diagnosis not present

## 2017-02-10 DIAGNOSIS — K2270 Barrett's esophagus without dysplasia: Secondary | ICD-10-CM | POA: Diagnosis not present

## 2017-02-12 DIAGNOSIS — I509 Heart failure, unspecified: Secondary | ICD-10-CM | POA: Diagnosis not present

## 2017-02-12 DIAGNOSIS — I2510 Atherosclerotic heart disease of native coronary artery without angina pectoris: Secondary | ICD-10-CM | POA: Diagnosis not present

## 2017-02-12 DIAGNOSIS — K2270 Barrett's esophagus without dysplasia: Secondary | ICD-10-CM | POA: Diagnosis not present

## 2017-02-12 DIAGNOSIS — J09X1 Influenza due to identified novel influenza A virus with pneumonia: Secondary | ICD-10-CM | POA: Diagnosis not present

## 2017-02-12 DIAGNOSIS — I110 Hypertensive heart disease with heart failure: Secondary | ICD-10-CM | POA: Diagnosis not present

## 2017-02-12 DIAGNOSIS — I482 Chronic atrial fibrillation: Secondary | ICD-10-CM | POA: Diagnosis not present

## 2017-02-19 ENCOUNTER — Emergency Department (HOSPITAL_COMMUNITY): Payer: Medicare Other

## 2017-02-19 ENCOUNTER — Encounter (HOSPITAL_COMMUNITY): Payer: Self-pay | Admitting: Emergency Medicine

## 2017-02-19 ENCOUNTER — Emergency Department (HOSPITAL_COMMUNITY)
Admission: EM | Admit: 2017-02-19 | Discharge: 2017-02-19 | Disposition: A | Discharge status: Home / Self Care | Payer: Medicare Other | Attending: Emergency Medicine | Admitting: Emergency Medicine

## 2017-02-19 DIAGNOSIS — I5042 Chronic combined systolic (congestive) and diastolic (congestive) heart failure: Secondary | ICD-10-CM | POA: Insufficient documentation

## 2017-02-19 DIAGNOSIS — B999 Unspecified infectious disease: Secondary | ICD-10-CM | POA: Diagnosis not present

## 2017-02-19 DIAGNOSIS — I2510 Atherosclerotic heart disease of native coronary artery without angina pectoris: Secondary | ICD-10-CM | POA: Insufficient documentation

## 2017-02-19 DIAGNOSIS — Z7902 Long term (current) use of antithrombotics/antiplatelets: Secondary | ICD-10-CM | POA: Insufficient documentation

## 2017-02-19 DIAGNOSIS — Z79899 Other long term (current) drug therapy: Secondary | ICD-10-CM | POA: Diagnosis not present

## 2017-02-19 DIAGNOSIS — R100 Acute abdomen: Secondary | ICD-10-CM | POA: Diagnosis not present

## 2017-02-19 DIAGNOSIS — N189 Chronic kidney disease, unspecified: Secondary | ICD-10-CM | POA: Insufficient documentation

## 2017-02-19 DIAGNOSIS — I700 Atherosclerosis of aorta: Secondary | ICD-10-CM | POA: Diagnosis not present

## 2017-02-19 DIAGNOSIS — R339 Retention of urine, unspecified: Secondary | ICD-10-CM | POA: Diagnosis not present

## 2017-02-19 DIAGNOSIS — B379 Candidiasis, unspecified: Secondary | ICD-10-CM | POA: Insufficient documentation

## 2017-02-19 DIAGNOSIS — Z950 Presence of cardiac pacemaker: Secondary | ICD-10-CM | POA: Diagnosis not present

## 2017-02-19 DIAGNOSIS — Z8673 Personal history of transient ischemic attack (TIA), and cerebral infarction without residual deficits: Secondary | ICD-10-CM | POA: Diagnosis not present

## 2017-02-19 DIAGNOSIS — Z87891 Personal history of nicotine dependence: Secondary | ICD-10-CM | POA: Insufficient documentation

## 2017-02-19 DIAGNOSIS — B372 Candidiasis of skin and nail: Secondary | ICD-10-CM

## 2017-02-19 DIAGNOSIS — I130 Hypertensive heart and chronic kidney disease with heart failure and stage 1 through stage 4 chronic kidney disease, or unspecified chronic kidney disease: Secondary | ICD-10-CM | POA: Diagnosis not present

## 2017-02-19 DIAGNOSIS — Z8546 Personal history of malignant neoplasm of prostate: Secondary | ICD-10-CM | POA: Diagnosis not present

## 2017-02-19 DIAGNOSIS — N3000 Acute cystitis without hematuria: Secondary | ICD-10-CM | POA: Insufficient documentation

## 2017-02-19 DIAGNOSIS — Z951 Presence of aortocoronary bypass graft: Secondary | ICD-10-CM | POA: Diagnosis not present

## 2017-02-19 DIAGNOSIS — I714 Abdominal aortic aneurysm, without rupture: Secondary | ICD-10-CM | POA: Diagnosis not present

## 2017-02-19 DIAGNOSIS — R1012 Left upper quadrant pain: Secondary | ICD-10-CM | POA: Diagnosis present

## 2017-02-19 DIAGNOSIS — R1032 Left lower quadrant pain: Secondary | ICD-10-CM | POA: Diagnosis not present

## 2017-02-19 LAB — COMPREHENSIVE METABOLIC PANEL
ALK PHOS: 83 U/L (ref 38–126)
ALT: 9 U/L — AB (ref 17–63)
AST: 21 U/L (ref 15–41)
Albumin: 3.6 g/dL (ref 3.5–5.0)
Anion gap: 8 (ref 5–15)
BUN: 13 mg/dL (ref 6–20)
CALCIUM: 8.9 mg/dL (ref 8.9–10.3)
CHLORIDE: 106 mmol/L (ref 101–111)
CO2: 27 mmol/L (ref 22–32)
CREATININE: 1.01 mg/dL (ref 0.61–1.24)
GFR calc Af Amer: >60 mL/min (ref >60)
GFR calc non Af Amer: >60 mL/min (ref >60)
GLUCOSE: 90 mg/dL (ref 65–99)
Potassium: 4 mmol/L (ref 3.5–5.1)
Sodium: 141 mmol/L (ref 135–145)
Total Bilirubin: 1.3 mg/dL — ABNORMAL HIGH (ref 0.3–1.2)
Total Protein: 6.1 g/dL — ABNORMAL LOW (ref 6.5–8.1)

## 2017-02-19 LAB — CBC WITH DIFFERENTIAL/PLATELET
BASOS ABS: 0.1 10*3/uL (ref 0.0–0.1)
Basophils Relative: 1 %
EOS ABS: 0.5 10*3/uL (ref 0.0–0.7)
EOS PCT: 6 %
HCT: 42.5 % (ref 39.0–52.0)
HEMOGLOBIN: 13.7 g/dL (ref 13.0–17.0)
LYMPHS ABS: 2.4 10*3/uL (ref 0.7–4.0)
LYMPHS PCT: 30 %
MCH: 30.1 pg (ref 26.0–34.0)
MCHC: 32.2 g/dL (ref 30.0–36.0)
MCV: 93.4 fL (ref 78.0–100.0)
Monocytes Absolute: 1.1 10*3/uL — ABNORMAL HIGH (ref 0.1–1.0)
Monocytes Relative: 14 %
NEUTROS PCT: 49 %
Neutro Abs: 3.9 10*3/uL (ref 1.7–7.7)
PLATELETS: 206 10*3/uL (ref 150–400)
RBC: 4.55 MIL/uL (ref 4.22–5.81)
RDW: 15.9 % — ABNORMAL HIGH (ref 11.5–15.5)
WBC: 7.9 10*3/uL (ref 4.0–10.5)

## 2017-02-19 LAB — URINALYSIS, ROUTINE W REFLEX MICROSCOPIC
BILIRUBIN URINE: NEGATIVE
Glucose, UA: NEGATIVE mg/dL
HGB URINE DIPSTICK: NEGATIVE
KETONES UR: NEGATIVE mg/dL
NITRITE: NEGATIVE
PROTEIN: NEGATIVE mg/dL
Specific Gravity, Urine: >1.046 — ABNORMAL HIGH (ref 1.005–1.030)
pH: 7 (ref 5.0–8.0)

## 2017-02-19 LAB — PROTIME-INR
INR: 1.36
Prothrombin Time: 16.9 seconds — ABNORMAL HIGH (ref 11.4–15.2)

## 2017-02-19 LAB — I-STAT TROPONIN, ED: Troponin i, poc: 0 ng/mL (ref 0.00–0.08)

## 2017-02-19 LAB — LIPASE, BLOOD: Lipase: 22 U/L (ref 11–51)

## 2017-02-19 LAB — I-STAT CG4 LACTIC ACID, ED: Lactic Acid, Venous: 0.97 mmol/L (ref 0.5–1.9)

## 2017-02-19 LAB — BRAIN NATRIURETIC PEPTIDE: B NATRIURETIC PEPTIDE 5: 246.7 pg/mL — AB (ref 0.0–100.0)

## 2017-02-19 MED ORDER — NYSTATIN 100000 UNIT/GM EX POWD
Freq: Once | CUTANEOUS | Status: AC
  Administered 2017-02-19: 19:00:00 via TOPICAL
  Filled 2017-02-19: qty 15

## 2017-02-19 MED ORDER — IOPAMIDOL (ISOVUE-300) INJECTION 61%
INTRAVENOUS | Status: AC
  Filled 2017-02-19: qty 100

## 2017-02-19 MED ORDER — FENTANYL CITRATE (PF) 100 MCG/2ML IJ SOLN
50.0000 ug | Freq: Once | INTRAMUSCULAR | Status: AC
  Administered 2017-02-19: 50 ug via INTRAVENOUS
  Filled 2017-02-19: qty 2

## 2017-02-19 MED ORDER — CEPHALEXIN 500 MG PO CAPS: 500.0000 mg | ORAL_CAPSULE | Freq: Two times a day (BID) | ORAL | 0 refills | Status: AC

## 2017-02-19 MED ORDER — IOPAMIDOL (ISOVUE-300) INJECTION 61%
100.0000 mL | Freq: Once | INTRAVENOUS | Status: AC | PRN
  Administered 2017-02-19: 100 mL via INTRAVENOUS

## 2017-02-19 MED ORDER — CEPHALEXIN 500 MG PO CAPS
500.0000 mg | ORAL_CAPSULE | Freq: Once | ORAL | Status: AC
  Administered 2017-02-19: 500 mg via ORAL
  Filled 2017-02-19: qty 1

## 2017-02-19 MED ORDER — NYSTATIN 100000 UNIT/GM EX CREA: TOPICAL_CREAM | CUTANEOUS | 0 refills | Status: DC

## 2017-02-19 NOTE — ED Notes (Signed)
Patient repositioned in bed and tried to urinate using urinal.  Patient unable to get void at this time. Patient made aware that may have to use in and out cath to get urine specimen. Patient stated, "No, I do not want that".

## 2017-02-19 NOTE — ED Provider Notes (Signed)
[facility] DEPT Provider Note   CSN: [id number] Arrival date & time: 02/19/17  0910     History   Chief Complaint Chief Complaint  Patient presents with  . Abdominal Pain    HPI [name] is a 83 y.o. male with a past medical history significant for CAD status post CABG, atrial fibrillation on no gross, CHF with pacemaker, hypertension, hyperlipidemia, and diverticulosis who presents with left upper quadrant abdominal pain and swelling. Patient says that his pain began gradually yesterday. He says that it is tender in his left upper quadrant. It is a sharp pain. It does not radiate. He describes the pain as an 8 or 9 out of 10 severity. He denies any trauma to this area. He denies any nausea, vomiting, constipation, diarrhea, or changes in urination. He denies any chest pain, shortness breath, or cough. He reports his lower extremity edema in his both legs is unchanged from normal. He denies any headaches, vision changes, or any other symptoms. He denies any history of the same. He reports having a normal bowel movement earlier today. He denies any groin pain.  The history is provided by the patient and medical records.  Abdominal Pain   This is a new problem. The current episode started yesterday. The problem occurs constantly. The problem has not changed since onset.The pain is located in the LUQ. The pain is at a severity of 9/10. The pain is severe. Pertinent negatives include anorexia, fever, diarrhea, nausea, vomiting, constipation, dysuria, frequency and headaches. The symptoms are aggravated by palpation. Nothing relieves the symptoms. Past medical history comments: hx of divericulosis.    Past Medical History:  Diagnosis Date  . Adenocarcinoma of prostate ([place]) 10/2005   lupron injections q54mo  . Arthritis    ?rheumatoid, never dx or on DMARDs  . Atrial fibrillation ([facility])    chronic anticoag  . Barrett's esophagus   . CAD (coronary artery disease)    CABG 08/1973    . CHF (congestive heart failure) ([place])    ICM  . Chronic renal insufficiency   . Diverticulosis of colon   . GERD (gastroesophageal reflux disease)   . Hyperlipidemia   . Hypertension   . Memory loss   . Osteoporosis   . Pacemaker 09/2003   RESULTS:  This demonstrates successful implantation of a Medtronic dual  . Paroxysmal atrial fibrillation ([place])   . Unsteady gait    due to B ankle DJD and instability    Patient Active Problem List   Diagnosis Date Noted  . Dysphagia 03/08/2015  . Pacemaker 01/30/2015  . Aortic stenosis 01/30/2015  . CVA (cerebral infarction) 11/04/2014  . Mitral valve disorder 01/07/2011  . OSTEOPOROSIS 08/13/2010  . Chronic kidney disease 04/04/2009  . UNSTEADY GAIT 12/19/2008  . ATRIAL FIBRILLATION, PAROXYSMAL 09/29/2008  . BARRETTS ESOPHAGUS 12/02/2007  . ADENOCARCINOMA, PROSTATE, HX OF 11/16/2007  . Essential hypertension 10/11/2007  . Elevated lipids 05/20/2007  . Coronary atherosclerosis 05/20/2007  . Chronic combined systolic and diastolic congestive heart failure ([place]) 05/20/2007    Past Surgical History:  Procedure Laterality Date  . CORONARY ARTERY BYPASS GRAFT  08/1973   3 heart heart arteries were 80 percent clogged. the lower valve of my heart is not functioning  . DOPPLER ECHOCARDIOGRAPHY  2004, 2008  . HERNIA REPAIR     age 48 ( not sure if it was right or left side)  . PACEMAKER GENERATOR CHANGE N/A 05/02/2013   Procedure: PACEMAKER GENERATOR CHANGE;  Surgeon: Champ Mungo  Lovena Le, MD;  Location: [facility] CATH LAB;  Service: Cardiovascular;  Laterality: N/A;  . PACEMAKER PLACEMENT  09/2003       Home Medications    Prior to Admission medications   Medication Sig Start Date End Date Taking? Authorizing Provider  acetaminophen (TYLENOL) 325 MG tablet Take 650 mg by mouth every 6 (six) hours as needed (for pain).    Yes Historical Provider, MD  apixaban (ELIQUIS) 5 MG TABS tablet Take 1 tablet (5 mg total) by mouth 2 (two) times daily.  08/20/16  Yes Evans Lance, MD  furosemide (LASIX) 20 MG tablet Take 20 mg by mouth daily with breakfast.    Yes Historical Provider, MD  ipratropium-albuterol (DUONEB) 0.5-2.5 (3) MG/3ML SOLN Take 3 mLs by nebulization every 4 (four) hours as needed (for shortness of breath).   Yes Historical Provider, MD  LUTEIN-ZEAXANTHIN PO Take 1 tablet by mouth daily with breakfast.   Yes Historical Provider, MD  nitroGLYCERIN (NITROSTAT) 0.4 MG SL tablet Place 1 tablet (0.4 mg total) under the tongue every 5 (five) minutes as needed. 02/21/15  Yes Josue Hector, MD  potassium chloride SA (K-DUR,KLOR-CON) 20 MEQ tablet Take 10 mEq by mouth daily with breakfast.   Yes Historical Provider, MD  ranitidine (ZANTAC) 150 MG tablet TAKE 1 TABLET (150 MG TOTAL) BY MOUTH AT BEDTIME. 12/24/15  Yes Hoyt Koch, MD  vitamin C (ASCORBIC ACID) 500 MG tablet Take 1 tablet (500 mg total) by mouth daily. Patient taking differently: Take 500 mg by mouth daily with breakfast.  07/06/13  Yes Rowe Clack, MD  mirtazapine (REMERON) 15 MG tablet Take 1 tablet (15 mg total) by mouth at bedtime. Patient not taking: Reported on 02/19/2017 01/08/17   Hoyt Koch, MD  potassium chloride (K-DUR) 10 MEQ tablet TAKE 1 TABLET (10 MEQ TOTAL) BY MOUTH DAILY. Patient not taking: Reported on 02/19/2017 09/12/15   Hoyt Koch, MD    Family History Family History  Problem Relation Age of Onset  . Cancer Mother   . Heart attack Father     Social History Social History  Substance Use Topics  . Smoking status: Former Smoker    Quit date: 08/05/1986  . Smokeless tobacco: Never Used  . Alcohol use Yes     Comment: samll glass of wine     Allergies   Patient has no known allergies.   Review of Systems Review of Systems  Constitutional: Negative for appetite change, chills, diaphoresis, fatigue and fever.  HENT: Negative for congestion and rhinorrhea.   Eyes: Negative for visual disturbance.    Respiratory: Negative for cough, chest tightness, shortness of breath, wheezing and stridor.   Cardiovascular: Negative for chest pain and palpitations.  Gastrointestinal: Positive for abdominal pain. Negative for anorexia, constipation, diarrhea, nausea and vomiting.  Genitourinary: Positive for flank pain. Negative for dysuria, frequency and testicular pain.  Musculoskeletal: Negative for back pain, neck pain and neck stiffness.  Skin: Positive for rash. Negative for wound.  Neurological: Negative for syncope, light-headedness, numbness and headaches.  Psychiatric/Behavioral: Negative for agitation.     Physical Exam Updated Vital Signs Ht 5' 6.75" (1.695 m)   Wt 168 lb (76.2 kg)   BMI 26.51 kg/m   Physical Exam  Constitutional: He is oriented to person, place, and time. He appears well-developed and well-nourished. No distress.  HENT:  Head: Normocephalic.  Mouth/Throat: Oropharynx is clear and moist. No oropharyngeal exudate.  Eyes: Conjunctivae and EOM are normal. Pupils are equal,  round, and reactive to light.  Neck: Normal range of motion.  Cardiovascular: Normal rate and intact distal pulses.   No murmur heard. Pulmonary/Chest: Effort normal. No stridor. No respiratory distress. He has no wheezes. He has no rales. He exhibits no tenderness.  Abdominal: Soft. He exhibits mass (swelling). He exhibits no distension. There is tenderness in the left upper quadrant. There is no rebound and no CVA tenderness.    Musculoskeletal: He exhibits no tenderness.  Neurological: He is alert and oriented to person, place, and time. No sensory deficit.  Skin: Skin is warm. Capillary refill takes less than 2 seconds. He is not diaphoretic. No erythema. No pallor.  Psychiatric: He has a normal mood and affect.  Nursing note and vitals reviewed.    ED Treatments / Results  Labs (all labs ordered are listed, but only abnormal results are displayed) Labs Reviewed  CBC WITH  DIFFERENTIAL/PLATELET - Abnormal; Notable for the following:       Result Value   RDW 15.9 (*)    Monocytes Absolute 1.1 (*)    All other components within normal limits  COMPREHENSIVE METABOLIC PANEL - Abnormal; Notable for the following:    Total Protein 6.1 (*)    ALT 9 (*)    Total Bilirubin 1.3 (*)    All other components within normal limits  PROTIME-INR - Abnormal; Notable for the following:    Prothrombin Time 16.9 (*)    All other components within normal limits  URINALYSIS, ROUTINE W REFLEX MICROSCOPIC - Abnormal; Notable for the following:    APPearance HAZY (*)    Specific Gravity, Urine >1.046 (*)    Leukocytes, UA LARGE (*)    Bacteria, UA MANY (*)    Squamous Epithelial / LPF 6-30 (*)    All other components within normal limits  BRAIN NATRIURETIC PEPTIDE - Abnormal; Notable for the following:    B Natriuretic Peptide 246.7 (*)    All other components within normal limits  LIPASE, BLOOD  I-STAT CG4 LACTIC ACID, ED  I-STAT TROPOININ, ED  I-STAT CG4 LACTIC ACID, ED    EKG  EKG Interpretation None       Radiology Dg Chest 2 View  Result Date: 02/19/2017 CLINICAL DATA:  83 year old male with a history of left upper quadrant pain and lung crackles. EXAM: CHEST  2 VIEW COMPARISON:  11/16/2016 FINDINGS: Cardiomediastinal silhouette unchanged in size and contour. Calcifications of the aortic arch. Surgical changes of prior median sternotomy and CABG. Unchanged cardiac pacing device with 2 leads in place. Similar appearance of the lungs to comparison studies with interstitial opacities bilaterally and no focal airspace consolidation. No pleural effusion or pneumothorax. No displaced fracture. Multilevel degenerative changes of the spine. Upper lumbar compression fracture is unchanged. IMPRESSION: Background chronic lung changes without definite evidence of superimposed acute cardiopulmonary disease. Aortic atherosclerosis. Surgical changes of median sternotomy and CABG,  with unchanged cardiac pacing device. Electronically Signed   By: Corrie Mckusick D.O.   On: 02/19/2017 10:21   Ct Abdomen Pelvis W Contrast  Result Date: 02/19/2017 CLINICAL DATA:  Left side abdominal pain since yesterday. No known injury. EXAM: CT ABDOMEN AND PELVIS WITH CONTRAST TECHNIQUE: Multidetector CT imaging of the abdomen and pelvis was performed using the standard protocol following bolus administration of intravenous contrast. CONTRAST:  100 ml ISOVUE-300 IOPAMIDOL (ISOVUE-300) INJECTION 61% COMPARISON:  CT abdomen and pelvis 01/05/2009. FINDINGS: Lower chest: There is cardiomegaly. No pericardial effusion. The patient is status post CABG. Pacing leads are noted.  There are very small bilateral pleural effusions. Very mild dependent atelectasis is noted. Hepatobiliary: The liver is low attenuating consistent with fatty infiltration. No focal liver lesion. The gallbladder appears normal. There is mild prominence of the common bile duct like related to the patient's age. Pancreas: Unremarkable. No pancreatic ductal dilatation or surrounding inflammatory changes. Spleen: Normal in size without focal abnormality. Adrenals/Urinary Tract: Adrenal glands are unremarkable. Kidneys are normal, without renal calculi, focal lesion, or hydronephrosis. Bladder is unremarkable. Stomach/Bowel: Sigmoid diverticulosis without diverticulitis is identified. There is no small bowel obstruction or evidence of inflammatory change. The stomach is unremarkable. No evidence of appendicitis. Vascular/Lymphatic: Atherosclerotic vascular disease identified. Abdominal aortic aneurysm measures 3.9 x 4.0 cm compared to 3.9 x 3.5 cm on the prior CT. No hemorrhage. No lymphadenopathy. Reproductive: Prostate is unremarkable. Other: Right inguinal hernia containing loops of small bowel appears unchanged. Musculoskeletal: Multilevel spondylosis is seen. Remote T12 compression fracture is noted. IMPRESSION: No acute abnormality abdomen or  pelvis. No change in a right inguinal hernia containing loops of small bowel without obstruction other complicating feature. Fatty infiltration of the liver. Abdominal aortic aneurysm is slightly increased in size measuring 3.9 x 4.0 cm on today's exam. Recommend followup by ultrasound in 1 year. This recommendation follows ACR consensus guidelines: White Paper of the ACR Incidental Findings Committee II on Vascular Findings. J Am Coll Radiol 2013; 10:789-794. Sigmoid diverticulosis without diverticulitis. Very small bilateral pleural effusions, larger on the right. Cardiomegaly Electronically Signed   By: Inge Rise M.D.   On: 02/19/2017 11:28    Procedures Procedures (including critical care time)  Medications Ordered in ED Medications  iopamidol (ISOVUE-300) 61 % injection (not administered)  fentaNYL (SUBLIMAZE) injection 50 mcg (50 mcg Intravenous Given 02/19/17 0942)  iopamidol (ISOVUE-300) 61 % injection 100 mL (100 mLs Intravenous Contrast Given 02/19/17 1038)  cephALEXin (KEFLEX) capsule 500 mg (500 mg Oral Given 02/19/17 1725)  nystatin (MYCOSTATIN/NYSTOP) topical powder ( Topical Given 02/19/17 1840)     Initial Impression / Assessment and Plan / ED Course  I have reviewed the triage vital signs and the nursing notes.  Pertinent labs & imaging results that were available during my care of the patient were reviewed by me and considered in my medical decision making (see chart for details).     MAVERYK RENSTROM is a 83 y.o. male with a past medical history significant for CAD status post CABG, atrial fibrillation on no gross, CHF with pacemaker, hypertension, hyperlipidemia, and diverticulosis who presents with left upper quadrant abdominal pain and swelling.   History and exam are seen above. Next  On exam, chest is nontender. Abdomen is tender in the left upper quadrant/flank area. There is a area of swelling and tenderness on the left upper quadrant. The left CVA area is  nontender over the left flank is tender. Patient's groin was examined and he has erythema and what appears to be a yeast infection in both groin folds. Patient has bilateral lower extremity edema that is nontender. Patient has crackles in the base of his lungs. Patient moving all extremity is with no focal neurologic deficits.  Given patient's left upper quadrant pain, and his exam, suspect muscle skeletal pain versus abscess versus abdominal wall hematoma versus diverticulitis versus other abdominal pain. Patient will have laboratory testing, x-ray of the chest given the crackles on exam, urinalysis, and will have CT scan to look for hematoma or other intra-abdominal pathology.   Patient given pain medicine during initial workup.  Diagnostic workup showed No evidence of acute intra-abdominal abnormality. AAA is slightly increased in size and they recommend ultrasound in one year. Chest x-ray shows no pneumonia with chronic lung changes.  While waiting urinalysis, patient had urinary retention. Foley catheter was placed after several hundred cc of urine was found. Patient had relief of abdominal pain after catheter placement. Urinalysis shows evidence of UTI. BNP slightly elevated and fits with slight blurred [place] edema and crackles in lungs. Even patients nonelevated heart rate and reassuring oxygen saturations, do not feel patient requires admission.  Patient given dose of Keflex and will be given prescription for discharge. Patient will be discharged with Foley catheter in place and will follow up with urology for removal.  Patient and family agree with plan for discharge and for follow-up with both PCP and urology. Patient had no other questions or concerns and patient was discharged in good condition with relief of abdominal pain.    Final Clinical Impressions(s) / ED Diagnoses   Final diagnoses:  Acute cystitis without hematuria  Urinary retention  Yeast infection of the skin    New  Prescriptions Discharge Medication List as of 02/19/2017  5:33 PM    START taking these medications   Details  cephALEXin (KEFLEX) 500 MG capsule Take 1 capsule (500 mg total) by mouth 2 (two) times daily., Starting Thu 02/19/2017, Until Thu 02/26/2017, Print    nystatin cream (MYCOSTATIN) Apply to affected area 2 times daily, Print        Clinical Impression: 1. Acute cystitis without hematuria   2. Urinary retention   3. Yeast infection of the skin     Disposition: Discharge  Condition: Good  I have discussed the results, Dx and Tx plan with the pt(& family if present). He/she/they expressed understanding and agree(s) with the plan. Discharge instructions discussed at great length. Strict return precautions discussed and pt &/or family have verbalized understanding of the instructions. No further questions at time of discharge.    Discharge Medication List as of 02/19/2017  5:33 PM    START taking these medications   Details  cephALEXin (KEFLEX) 500 MG capsule Take 1 capsule (500 mg total) by mouth 2 (two) times daily., Starting Thu 02/19/2017, Until Thu 02/26/2017, Print    nystatin cream (MYCOSTATIN) Apply to affected area 2 times daily, Print        Follow Up: Hoyt Koch, MD [place] [id number] [place] DEPT [place] [phone] [place] [phone]  If symptoms worsen  [facility] [place] [phone] Schedule an appointment as soon as possible for a visit       Courtney Paris, MD 02/19/17 2214

## 2017-02-19 NOTE — ED Notes (Signed)
Made Dr Parke Simmers aware that bladder scan average was 362ml.  Verbal order to insert foley catheter due to urinary retention and UA positive for UTI.

## 2017-02-19 NOTE — Discharge Instructions (Signed)
Please take your antibiotics to treat the urinary retention likely from your urinary tract infection. Please keep the Foley catheter in place until you see urology for follow-up. Please use the nystatin cream on your yeast infection of the groin. If any symptoms change or worsen, please return to the nearest emergency department.

## 2017-02-19 NOTE — ED Notes (Signed)
Bed: ZC58 Expected date:  Expected time:  Means of arrival:  Comments: EMS-male ABD PAIN

## 2017-02-19 NOTE — ED Notes (Signed)
ED Provider at bedside. 

## 2017-02-19 NOTE — ED Notes (Signed)
Patient transported to CT 

## 2017-02-19 NOTE — ED Notes (Signed)
Patient transported to X-ray 

## 2017-02-19 NOTE — ED Triage Notes (Signed)
Per [facility] patient comes [place] for left side abd pain that started yesterday.  Patient states that had no injuries to cause pain, denies any n/v/d, constipation or urinary problems.  Patient states that he had BM yesterday and one this morning.  Patient pain worse with movement and palpation. Patient reports sharp intermittent pains even with rest.

## 2017-02-23 DIAGNOSIS — K2270 Barrett's esophagus without dysplasia: Secondary | ICD-10-CM | POA: Diagnosis not present

## 2017-02-23 DIAGNOSIS — I110 Hypertensive heart disease with heart failure: Secondary | ICD-10-CM | POA: Diagnosis not present

## 2017-02-23 DIAGNOSIS — I482 Chronic atrial fibrillation: Secondary | ICD-10-CM | POA: Diagnosis not present

## 2017-02-23 DIAGNOSIS — J09X1 Influenza due to identified novel influenza A virus with pneumonia: Secondary | ICD-10-CM | POA: Diagnosis not present

## 2017-02-23 DIAGNOSIS — I509 Heart failure, unspecified: Secondary | ICD-10-CM | POA: Diagnosis not present

## 2017-02-23 DIAGNOSIS — I2510 Atherosclerotic heart disease of native coronary artery without angina pectoris: Secondary | ICD-10-CM | POA: Diagnosis not present

## 2017-02-25 DIAGNOSIS — C61 Malignant neoplasm of prostate: Secondary | ICD-10-CM | POA: Diagnosis not present

## 2017-02-25 DIAGNOSIS — R338 Other retention of urine: Secondary | ICD-10-CM | POA: Diagnosis not present

## 2017-03-02 DIAGNOSIS — I2510 Atherosclerotic heart disease of native coronary artery without angina pectoris: Secondary | ICD-10-CM | POA: Diagnosis not present

## 2017-03-02 DIAGNOSIS — I482 Chronic atrial fibrillation: Secondary | ICD-10-CM | POA: Diagnosis not present

## 2017-03-02 DIAGNOSIS — J09X1 Influenza due to identified novel influenza A virus with pneumonia: Secondary | ICD-10-CM | POA: Diagnosis not present

## 2017-03-02 DIAGNOSIS — I110 Hypertensive heart disease with heart failure: Secondary | ICD-10-CM | POA: Diagnosis not present

## 2017-03-02 DIAGNOSIS — I509 Heart failure, unspecified: Secondary | ICD-10-CM | POA: Diagnosis not present

## 2017-03-02 DIAGNOSIS — K2270 Barrett's esophagus without dysplasia: Secondary | ICD-10-CM | POA: Diagnosis not present

## 2017-03-03 ENCOUNTER — Encounter (HOSPITAL_COMMUNITY): Payer: Self-pay

## 2017-03-03 ENCOUNTER — Emergency Department (HOSPITAL_COMMUNITY): Payer: Medicare Other

## 2017-03-03 ENCOUNTER — Emergency Department (HOSPITAL_COMMUNITY)
Admission: EM | Admit: 2017-03-03 | Discharge: 2017-03-03 | Disposition: A | Discharge status: Home / Self Care | Payer: Medicare Other | Attending: Emergency Medicine | Admitting: Emergency Medicine

## 2017-03-03 DIAGNOSIS — M25552 Pain in left hip: Secondary | ICD-10-CM | POA: Insufficient documentation

## 2017-03-03 DIAGNOSIS — S3993XA Unspecified injury of pelvis, initial encounter: Secondary | ICD-10-CM | POA: Diagnosis not present

## 2017-03-03 DIAGNOSIS — T148XXA Other injury of unspecified body region, initial encounter: Secondary | ICD-10-CM | POA: Diagnosis not present

## 2017-03-03 DIAGNOSIS — S79911A Unspecified injury of right hip, initial encounter: Secondary | ICD-10-CM | POA: Diagnosis present

## 2017-03-03 DIAGNOSIS — I130 Hypertensive heart and chronic kidney disease with heart failure and stage 1 through stage 4 chronic kidney disease, or unspecified chronic kidney disease: Secondary | ICD-10-CM | POA: Insufficient documentation

## 2017-03-03 DIAGNOSIS — R102 Pelvic and perineal pain: Secondary | ICD-10-CM | POA: Diagnosis not present

## 2017-03-03 DIAGNOSIS — M25532 Pain in left wrist: Secondary | ICD-10-CM | POA: Diagnosis not present

## 2017-03-03 DIAGNOSIS — Y999 Unspecified external cause status: Secondary | ICD-10-CM | POA: Diagnosis not present

## 2017-03-03 DIAGNOSIS — S299XXA Unspecified injury of thorax, initial encounter: Secondary | ICD-10-CM | POA: Diagnosis not present

## 2017-03-03 DIAGNOSIS — W1839XA Other fall on same level, initial encounter: Secondary | ICD-10-CM | POA: Insufficient documentation

## 2017-03-03 DIAGNOSIS — Y939 Activity, unspecified: Secondary | ICD-10-CM | POA: Diagnosis not present

## 2017-03-03 DIAGNOSIS — Z79899 Other long term (current) drug therapy: Secondary | ICD-10-CM | POA: Diagnosis not present

## 2017-03-03 DIAGNOSIS — M549 Dorsalgia, unspecified: Secondary | ICD-10-CM | POA: Diagnosis not present

## 2017-03-03 DIAGNOSIS — Z951 Presence of aortocoronary bypass graft: Secondary | ICD-10-CM | POA: Diagnosis not present

## 2017-03-03 DIAGNOSIS — W19XXXA Unspecified fall, initial encounter: Secondary | ICD-10-CM

## 2017-03-03 DIAGNOSIS — M25551 Pain in right hip: Secondary | ICD-10-CM | POA: Insufficient documentation

## 2017-03-03 DIAGNOSIS — Y929 Unspecified place or not applicable: Secondary | ICD-10-CM | POA: Diagnosis not present

## 2017-03-03 DIAGNOSIS — Z950 Presence of cardiac pacemaker: Secondary | ICD-10-CM | POA: Diagnosis not present

## 2017-03-03 DIAGNOSIS — Z7901 Long term (current) use of anticoagulants: Secondary | ICD-10-CM | POA: Insufficient documentation

## 2017-03-03 DIAGNOSIS — N189 Chronic kidney disease, unspecified: Secondary | ICD-10-CM | POA: Insufficient documentation

## 2017-03-03 DIAGNOSIS — I2510 Atherosclerotic heart disease of native coronary artery without angina pectoris: Secondary | ICD-10-CM | POA: Diagnosis not present

## 2017-03-03 DIAGNOSIS — Z87891 Personal history of nicotine dependence: Secondary | ICD-10-CM | POA: Insufficient documentation

## 2017-03-03 DIAGNOSIS — I5042 Chronic combined systolic (congestive) and diastolic (congestive) heart failure: Secondary | ICD-10-CM | POA: Insufficient documentation

## 2017-03-03 DIAGNOSIS — M25511 Pain in right shoulder: Secondary | ICD-10-CM | POA: Insufficient documentation

## 2017-03-03 DIAGNOSIS — M25531 Pain in right wrist: Secondary | ICD-10-CM | POA: Diagnosis not present

## 2017-03-03 NOTE — ED Notes (Signed)
Notified x-ray pt ready for transport. X-ray stated pt is next.

## 2017-03-03 NOTE — ED Triage Notes (Signed)
Pt brought in by EMS due to falling this afternoon. Pt lost his footing and fell. Pt denies LOC or hitting head. Pt does take Eliquis. Pt a&ox3.

## 2017-03-03 NOTE — ED Notes (Addendum)
Pt was incontinent of urine. Peri care was performed and sheets and incontinent pad changed.

## 2017-03-03 NOTE — ED Provider Notes (Signed)
[facility] DEPT Provider Note   CSN: [id number] Arrival date & time: 03/03/17  1237     History   Chief Complaint Chief Complaint  Patient presents with  . Fall    HPI [name] is a 83 y.o. male.   Fall  This is a new problem. The current episode started less than 1 hour ago. The problem occurs constantly. The problem has not changed since onset.Pertinent negatives include no chest pain. Associated symptoms comments: Mild right shoulder, bilateral wrist, bilateral ankle and pelvis pain of which only pelvis pain is new.. Nothing relieves the symptoms. He has tried nothing for the symptoms.    Past Medical History:  Diagnosis Date  . Adenocarcinoma of prostate ([place]) 10/2005   lupron injections q49mo  . Arthritis    ?rheumatoid, never dx or on DMARDs  . Atrial fibrillation (HCC)    chronic anticoag  . Barrett's esophagus   . CAD (coronary artery disease)    CABG 08/1973  . CHF (congestive heart failure) ([place])    ICM  . Chronic renal insufficiency   . Diverticulosis of colon   . GERD (gastroesophageal reflux disease)   . Hyperlipidemia   . Hypertension   . Memory loss   . Osteoporosis   . Pacemaker 09/2003   RESULTS:  This demonstrates successful implantation of a Medtronic dual  . Paroxysmal atrial fibrillation ([place])   . Unsteady gait    due to B ankle DJD and instability    Patient Active Problem List   Diagnosis Date Noted  . Dysphagia 03/08/2015  . Pacemaker 01/30/2015  . Aortic stenosis 01/30/2015  . CVA (cerebral infarction) 11/04/2014  . Mitral valve disorder 01/07/2011  . OSTEOPOROSIS 08/13/2010  . Chronic kidney disease 04/04/2009  . UNSTEADY GAIT 12/19/2008  . ATRIAL FIBRILLATION, PAROXYSMAL 09/29/2008  . BARRETTS ESOPHAGUS 12/02/2007  . ADENOCARCINOMA, PROSTATE, HX OF 11/16/2007  . Essential hypertension 10/11/2007  . Elevated lipids 05/20/2007  . Coronary atherosclerosis 05/20/2007  . Chronic combined systolic and diastolic  congestive heart failure ([place]) 05/20/2007    Past Surgical History:  Procedure Laterality Date  . CORONARY ARTERY BYPASS GRAFT  08/1973   3 heart heart arteries were 80 percent clogged. the lower valve of my heart is not functioning  . DOPPLER ECHOCARDIOGRAPHY  2004, 2008  . HERNIA REPAIR     age 44 ( not sure if it was right or left side)  . PACEMAKER GENERATOR CHANGE N/A 05/02/2013   Procedure: PACEMAKER GENERATOR CHANGE;  Surgeon: Evans Lance, MD;  Location: [facility] CATH LAB;  Service: Cardiovascular;  Laterality: N/A;  . PACEMAKER PLACEMENT  09/2003       Home Medications    Prior to Admission medications   Medication Sig Start Date End Date Taking? Authorizing Provider  acetaminophen (TYLENOL) 325 MG tablet Take 650 mg by mouth every 6 (six) hours as needed (for pain).    Yes Historical Provider, MD  apixaban (ELIQUIS) 5 MG TABS tablet Take 1 tablet (5 mg total) by mouth 2 (two) times daily. 08/20/16  Yes Evans Lance, MD  furosemide (LASIX) 20 MG tablet Take 20 mg by mouth daily with breakfast.    Yes Historical Provider, MD  LUTEIN-ZEAXANTHIN PO Take 1 tablet by mouth daily with breakfast.   Yes Historical Provider, MD  nitroGLYCERIN (NITROSTAT) 0.4 MG SL tablet Place 1 tablet (0.4 mg total) under the tongue every 5 (five) minutes as needed. 02/21/15  Yes Josue Hector, MD  nystatin cream (MYCOSTATIN)  Apply to affected area 2 times daily 02/19/17  Yes Gwenyth Allegra Tegeler, MD  potassium chloride (K-DUR) 10 MEQ tablet TAKE 1 TABLET (10 MEQ TOTAL) BY MOUTH DAILY. 09/12/15  Yes Hoyt Koch, MD  ranitidine (ZANTAC) 150 MG tablet TAKE 1 TABLET (150 MG TOTAL) BY MOUTH AT BEDTIME. 12/24/15  Yes Hoyt Koch, MD  vitamin C (ASCORBIC ACID) 500 MG tablet Take 1 tablet (500 mg total) by mouth daily. Patient taking differently: Take 500 mg by mouth daily with breakfast.  07/06/13  Yes Rowe Clack, MD  mirtazapine (REMERON) 15 MG tablet Take 1 tablet (15 mg total) by  mouth at bedtime. Patient not taking: Reported on 02/19/2017 01/08/17   Hoyt Koch, MD    Family History Family History  Problem Relation Age of Onset  . Cancer Mother   . Heart attack Father     Social History Social History  Substance Use Topics  . Smoking status: Former Smoker    Quit date: 08/05/1986  . Smokeless tobacco: Never Used  . Alcohol use Yes     Comment: samll glass of wine     Allergies   Patient has no known allergies.   Review of Systems Review of Systems  Cardiovascular: Negative for chest pain.  All other systems reviewed and are negative.    Physical Exam Updated Vital Signs BP 118/70   Pulse 66   Resp 18   Ht 5\' 6"  (1.676 m)   Wt 168 lb (76.2 kg)   SpO2 95%   BMI 27.12 kg/m   Physical Exam  Constitutional: He appears well-developed and well-nourished.  HENT:  Head: Normocephalic and atraumatic.  Eyes: Conjunctivae and EOM are normal.  Neck: Normal range of motion.  Cardiovascular: Normal rate.   Pulmonary/Chest: Effort normal. No respiratory distress.  Abdominal: Soft. He exhibits no distension.  Musculoskeletal: Normal range of motion. He exhibits tenderness (pelvis with AP and lateral compression which is new. has ttp in ankles, wrists, legs that is not new).  Neurological: He is alert.  Skin: Skin is warm and dry.  Nursing note and vitals reviewed.    ED Treatments / Results  Labs (all labs ordered are listed, but only abnormal results are displayed) Labs Reviewed - No data to display  EKG  EKG Interpretation None       Radiology Dg Chest 2 View  Result Date: 03/03/2017 CLINICAL DATA:  [place]. Mid back pain. Dementia. Bilateral leg swelling. Hx HTN, A-Fib, CHF, and CVA. EXAM: CHEST  2 VIEW COMPARISON:  02/19/2017 FINDINGS: Changes from cardiac surgery are stable. The cardiac silhouette is mildly enlarged. No mediastinal or hilar masses. No convincing adenopathy. Mild bilateral interstitial thickening and prominent  bronchovascular markings is similar to the prior study, and older exams. No lung consolidation. No pleural effusion or pneumothorax. Chronic compression fracture at the thoracolumbar junction stable from prior studies. No evidence of an acute fracture. IMPRESSION: 1. No convincing acute cardiopulmonary disease. 2. Mild cardiomegaly and chronic prominence of bronchovascular markings and mild interstitial thickening. Electronically Signed   By: Lajean Manes M.D.   On: 03/03/2017 15:50   Dg Pelvis 1-2 Views  Result Date: 03/03/2017 CLINICAL DATA:  [place]. No reported hip pain. Mid back pain. Dementia. Bilateral leg swelling. Hx HTN, A-Fib, CHF, and CVA. EXAM: PELVIS - 1-2 VIEW COMPARISON:  None. FINDINGS: No fracture.  No bone lesion. The hip joints, SI joints and symphysis pubis are normally aligned. Bones are diffusely demineralized. There are aortic, iliac  and femoral artery vascular calcifications. IMPRESSION: 1. No fracture, dislocation or acute finding. Electronically Signed   By: Lajean Manes M.D.   On: 03/03/2017 15:52    Procedures Procedures (including critical care time)  Medications Ordered in ED Medications - No data to display   Initial Impression / Assessment and Plan / ED Course  I have reviewed the triage vital signs and the nursing notes.  Pertinent labs & imaging results that were available during my care of the patient were reviewed by me and considered in my medical decision making (see chart for details).    Fall, but did not hit head.  Pain likely from chronic conditions. At baseline otherwise.  XRs negative.  Stable for dc.   Final Clinical Impressions(s) / ED Diagnoses   Final diagnoses:  Fall, initial encounter    New Prescriptions Discharge Medication List as of 03/03/2017  3:56 PM       Merrily Pew, MD 03/03/17 [phone]

## 2017-03-18 DIAGNOSIS — C61 Malignant neoplasm of prostate: Secondary | ICD-10-CM | POA: Diagnosis not present

## 2017-03-18 DIAGNOSIS — R338 Other retention of urine: Secondary | ICD-10-CM | POA: Diagnosis not present

## 2017-04-15 ENCOUNTER — Ambulatory Visit (INDEPENDENT_AMBULATORY_CARE_PROVIDER_SITE_OTHER): Payer: Medicare Other | Admitting: Podiatry

## 2017-04-15 ENCOUNTER — Encounter: Payer: Self-pay | Admitting: Podiatry

## 2017-04-15 DIAGNOSIS — M79676 Pain in unspecified toe(s): Secondary | ICD-10-CM

## 2017-04-15 DIAGNOSIS — B351 Tinea unguium: Secondary | ICD-10-CM

## 2017-04-15 NOTE — Progress Notes (Signed)
Patient ID: [name], male   DOB: Jun 30, 1917, 83 y.o.   MRN: [id number] Complaint:  Visit Type: Patient returns to my office for continued preventative foot care services. Complaint: Patient states" my nails have grown long and thick and become painful to walk and wear shoes" . The patient presents for preventative foot care services. No changes to ROS  Podiatric Exam: Vascular: dorsalis pedis and posterior tibial pulses are not  palpable bilateral. Capillary return is immediate. Temperature gradient is WNL. Skin turgor WNL  Sensorium: Normal Semmes Weinstein monofilament test. Normal tactile sensation bilaterally. Nail Exam: Pt has thick disfigured discolored nails with subungual debris noted bilateral entire nail hallux through fifth toenails Ulcer Exam: There is no evidence of ulcer or pre-ulcerative changes or infection. Orthopedic Exam: Muscle tone and strength are WNL. No limitations in general ROM. No crepitus or effusions noted. Foot type and digits show no abnormalities. Bony prominences are unremarkable. Skin: No Porokeratosis. No infection or ulcers  Diagnosis:  Onychomycosis, , Pain in right toe, pain in left toes  Treatment & Plan Procedures and Treatment: Consent by patient was obtained for treatment procedures. The patient understood the discussion of treatment and procedures well. All questions were answered thoroughly reviewed. Debridement of mycotic and hypertrophic toenails, 1 through 5 bilateral and clearing of subungual debris. No ulceration, no infection noted.  Return Visit-Office Procedure: Patient instructed to return to the office for a follow up visit 3 months for continued evaluation and treatment.   Gardiner Barefoot DPM

## 2017-05-05 ENCOUNTER — Telehealth: Payer: Self-pay | Admitting: Cardiology

## 2017-05-05 ENCOUNTER — Encounter: Payer: Medicare Other | Admitting: *Deleted

## 2017-05-05 NOTE — Telephone Encounter (Signed)
Confirmed remote transmission w/ pt nurse.   

## 2017-05-08 ENCOUNTER — Encounter: Payer: Self-pay | Admitting: Cardiology

## 2017-05-20 DIAGNOSIS — Z950 Presence of cardiac pacemaker: Secondary | ICD-10-CM | POA: Diagnosis not present

## 2017-05-20 DIAGNOSIS — I5089 Other heart failure: Secondary | ICD-10-CM | POA: Diagnosis not present

## 2017-05-20 DIAGNOSIS — K219 Gastro-esophageal reflux disease without esophagitis: Secondary | ICD-10-CM | POA: Diagnosis not present

## 2017-05-20 DIAGNOSIS — R600 Localized edema: Secondary | ICD-10-CM | POA: Diagnosis not present

## 2017-05-20 DIAGNOSIS — R5381 Other malaise: Secondary | ICD-10-CM | POA: Diagnosis not present

## 2017-05-20 DIAGNOSIS — I480 Paroxysmal atrial fibrillation: Secondary | ICD-10-CM | POA: Diagnosis not present

## 2017-05-20 DIAGNOSIS — R413 Other amnesia: Secondary | ICD-10-CM | POA: Diagnosis not present

## 2017-05-20 DIAGNOSIS — R2689 Other abnormalities of gait and mobility: Secondary | ICD-10-CM | POA: Diagnosis not present

## 2017-05-25 ENCOUNTER — Emergency Department (HOSPITAL_COMMUNITY)
Admission: EM | Admit: 2017-05-25 | Discharge: 2017-05-25 | Disposition: A | Discharge status: Home / Self Care | Payer: Medicare Other | Attending: Emergency Medicine | Admitting: Emergency Medicine

## 2017-05-25 ENCOUNTER — Emergency Department (HOSPITAL_COMMUNITY): Payer: Medicare Other

## 2017-05-25 DIAGNOSIS — Z950 Presence of cardiac pacemaker: Secondary | ICD-10-CM | POA: Diagnosis not present

## 2017-05-25 DIAGNOSIS — W0110XA Fall on same level from slipping, tripping and stumbling with subsequent striking against unspecified object, initial encounter: Secondary | ICD-10-CM | POA: Diagnosis not present

## 2017-05-25 DIAGNOSIS — R51 Headache: Secondary | ICD-10-CM | POA: Insufficient documentation

## 2017-05-25 DIAGNOSIS — Z87891 Personal history of nicotine dependence: Secondary | ICD-10-CM | POA: Insufficient documentation

## 2017-05-25 DIAGNOSIS — I110 Hypertensive heart disease with heart failure: Secondary | ICD-10-CM | POA: Insufficient documentation

## 2017-05-25 DIAGNOSIS — T148XXA Other injury of unspecified body region, initial encounter: Secondary | ICD-10-CM | POA: Diagnosis not present

## 2017-05-25 DIAGNOSIS — M25551 Pain in right hip: Secondary | ICD-10-CM | POA: Diagnosis not present

## 2017-05-25 DIAGNOSIS — S79911A Unspecified injury of right hip, initial encounter: Secondary | ICD-10-CM | POA: Diagnosis not present

## 2017-05-25 DIAGNOSIS — M546 Pain in thoracic spine: Secondary | ICD-10-CM | POA: Diagnosis not present

## 2017-05-25 DIAGNOSIS — N189 Chronic kidney disease, unspecified: Secondary | ICD-10-CM | POA: Insufficient documentation

## 2017-05-25 DIAGNOSIS — Z951 Presence of aortocoronary bypass graft: Secondary | ICD-10-CM | POA: Diagnosis not present

## 2017-05-25 DIAGNOSIS — W19XXXA Unspecified fall, initial encounter: Secondary | ICD-10-CM

## 2017-05-25 DIAGNOSIS — Y92012 Bathroom of single-family (private) house as the place of occurrence of the external cause: Secondary | ICD-10-CM | POA: Insufficient documentation

## 2017-05-25 DIAGNOSIS — I509 Heart failure, unspecified: Secondary | ICD-10-CM | POA: Diagnosis not present

## 2017-05-25 DIAGNOSIS — S199XXA Unspecified injury of neck, initial encounter: Secondary | ICD-10-CM | POA: Diagnosis not present

## 2017-05-25 DIAGNOSIS — R0781 Pleurodynia: Secondary | ICD-10-CM | POA: Diagnosis not present

## 2017-05-25 DIAGNOSIS — Z8546 Personal history of malignant neoplasm of prostate: Secondary | ICD-10-CM | POA: Insufficient documentation

## 2017-05-25 DIAGNOSIS — Y9301 Activity, walking, marching and hiking: Secondary | ICD-10-CM | POA: Diagnosis not present

## 2017-05-25 DIAGNOSIS — I2510 Atherosclerotic heart disease of native coronary artery without angina pectoris: Secondary | ICD-10-CM | POA: Diagnosis not present

## 2017-05-25 DIAGNOSIS — S0990XA Unspecified injury of head, initial encounter: Secondary | ICD-10-CM | POA: Diagnosis not present

## 2017-05-25 DIAGNOSIS — R0789 Other chest pain: Secondary | ICD-10-CM | POA: Diagnosis not present

## 2017-05-25 DIAGNOSIS — Y999 Unspecified external cause status: Secondary | ICD-10-CM | POA: Diagnosis not present

## 2017-05-25 DIAGNOSIS — S20221A Contusion of right back wall of thorax, initial encounter: Secondary | ICD-10-CM | POA: Diagnosis not present

## 2017-05-25 DIAGNOSIS — S299XXA Unspecified injury of thorax, initial encounter: Secondary | ICD-10-CM | POA: Diagnosis not present

## 2017-05-25 DIAGNOSIS — R079 Chest pain, unspecified: Secondary | ICD-10-CM | POA: Diagnosis not present

## 2017-05-25 LAB — CBC WITH DIFFERENTIAL/PLATELET
Basophils Absolute: 0 10*3/uL (ref 0.0–0.1)
Basophils Relative: 0 %
Eosinophils Absolute: 0.2 10*3/uL (ref 0.0–0.7)
Eosinophils Relative: 2 %
HEMATOCRIT: 41.2 % (ref 39.0–52.0)
HEMOGLOBIN: 13.3 g/dL (ref 13.0–17.0)
LYMPHS PCT: 20 %
Lymphs Abs: 1.7 10*3/uL (ref 0.7–4.0)
MCH: 29.7 pg (ref 26.0–34.0)
MCHC: 32.3 g/dL (ref 30.0–36.0)
MCV: 92 fL (ref 78.0–100.0)
MONOS PCT: 12 %
Monocytes Absolute: 1 10*3/uL (ref 0.1–1.0)
NEUTROS ABS: 5.9 10*3/uL (ref 1.7–7.7)
NEUTROS PCT: 66 %
Platelets: 186 10*3/uL (ref 150–400)
RBC: 4.48 MIL/uL (ref 4.22–5.81)
RDW: 15.9 % — ABNORMAL HIGH (ref 11.5–15.5)
WBC: 8.8 10*3/uL (ref 4.0–10.5)

## 2017-05-25 LAB — I-STAT TROPONIN, ED: Troponin i, poc: 0 ng/mL (ref 0.00–0.08)

## 2017-05-25 LAB — BASIC METABOLIC PANEL
ANION GAP: 9 (ref 5–15)
BUN: 15 mg/dL (ref 6–20)
CHLORIDE: 103 mmol/L (ref 101–111)
CO2: 25 mmol/L (ref 22–32)
CREATININE: 1 mg/dL (ref 0.61–1.24)
Calcium: 8.9 mg/dL (ref 8.9–10.3)
GFR calc non Af Amer: >60 mL/min (ref >60)
Glucose, Bld: 103 mg/dL — ABNORMAL HIGH (ref 65–99)
Potassium: 4.1 mmol/L (ref 3.5–5.1)
Sodium: 137 mmol/L (ref 135–145)

## 2017-05-25 LAB — PROTIME-INR
INR: 1.52
Prothrombin Time: 18.4 seconds — ABNORMAL HIGH (ref 11.4–15.2)

## 2017-05-25 MED ORDER — TRAMADOL HCL 50 MG PO TABS: 25.0000 mg | ORAL_TABLET | Freq: Once | ORAL | Status: DC

## 2017-05-25 MED ORDER — ACETAMINOPHEN 500 MG PO TABS
1000.0000 mg | ORAL_TABLET | Freq: Once | ORAL | Status: AC
  Administered 2017-05-25: 1000 mg via ORAL
  Filled 2017-05-25: qty 2

## 2017-05-25 MED ORDER — NAPROXEN 250 MG PO TABS
250.0000 mg | ORAL_TABLET | Freq: Once | ORAL | Status: AC
  Administered 2017-05-25: 250 mg via ORAL
  Filled 2017-05-25: qty 1

## 2017-05-25 NOTE — Discharge Instructions (Addendum)
You were seen in the emergency department for musculoskeletal pain after a fall. Please take tylenol as needed for pain--follow over the counter label instructions. Apply ice for 20 minutes four times a day to area--cover with towel--do not apply directly to skin. Please call to schedule a follow up appointment with your primary care doctor within 3-5 days. Return to the ER if you experience fevers, chills, unexplained weight loss, dizziness, vision or gait changes, sudden or severe headaches, changes in speech, confusion, neck pain, chest pain, shortness of breath, abdominal pain, nausea, vomiting, diarrhea, bladder or bowel dysfunction, numbness to groin, extremity numbness or tingling, extremity weakness, worsening symptoms, or any additional concerns.

## 2017-05-25 NOTE — ED Triage Notes (Signed)
Patient comes in per [facility] post fall. Fell 1am last Saturday after using the BR. [facility]. No LOC. Take eliquis. Abrasions on back noted. Pain to right side lumber pain. Did not hit head. A/ox4. Forgetful at baseline. Ems v/s 154/8, 74 HR, 16 RR, cbg 107.

## 2017-05-25 NOTE — ED Provider Notes (Signed)
[facility] Emergency Department Provider Note  ED Clinical Impression   Fall, initial encounter  Abrasion  Rib pain on right side  Hip pain, acute, right  History   Chief Complaint Fall   HPI  Patient is a 83 y.o. male with a PMH of afib (on eliquis), CAD, CHF, gerd, htn, hld, and pacemaker, who presents to ED from [place] with daughter for evaluation after fall 2 days ago, states at approx 0100 he slipped backwards from wearing socks on tile floor while in the bathroom, subsequently fell onto right side, since then has been experiencing pain to right hip and right posterior ribs, constant, describes as sharp, worse with palpation, has not tried OTC meds. Patient states that "I lightly hit my head," no LOC. Denies dizziness, CP, palpitations, SOB, extremity weakness, or any preceding symptoms. Able to ambulate with walker since fall. Denies fevers, chills, unexplained weight loss, dizziness, vision changes, diplopia, tinnitus, headaches, neck pain, CP, SOB, cough, hemoptysis, abdominal pain, n/v/d, dysuria, hematuria, extremity numbness or tingling, extremity weakness, or any additional concerns. Daughter at bedside states that patient is at baseline. Tetanus UTD.  Past Medical History:  Diagnosis Date  . Adenocarcinoma of prostate ([place]) 10/2005   lupron injections q67mo  . Arthritis    ?rheumatoid, never dx or on DMARDs  . Atrial fibrillation (H[facility])    chronic anticoag  . Barrett's esophagus   . CAD (coronary artery disease)    CABG 08/1973  . CHF (congestive heart failure) ([place])    ICM  . Chronic renal insufficiency   . Diverticulosis of colon   . GERD (gastroesophageal reflux disease)   . Hyperlipidemia   . Hypertension   . Memory loss   . Osteoporosis   . Pacemaker 09/2003   RESULTS:  This demonstrates successful implantation of a Medtronic dual  . Paroxysmal atrial fibrillation ([place])   . Unsteady gait    due to B ankle DJD and instability     Past Surgical History:  Procedure Laterality Date  . CORONARY ARTERY BYPASS GRAFT  08/1973   3 heart heart arteries were 80 percent clogged. the lower valve of my heart is not functioning  . DOPPLER ECHOCARDIOGRAPHY  2004, 2008  . HERNIA REPAIR     age 76 ( not sure if it was right or left side)  . PACEMAKER GENERATOR CHANGE N/A 05/02/2013   Procedure: PACEMAKER GENERATOR CHANGE;  Surgeon: Evans Lance, MD;  Location: [facility] CATH LAB;  Service: Cardiovascular;  Laterality: N/A;  . PACEMAKER PLACEMENT  09/2003    Current Outpatient Rx  . Order #: [id number] Class: Historical Med  . Order #: [id number] Class: Print  . Order #: [id number] Class: Historical Med  . Order #: [id number] Class: Historical Med  . Order #: [id number] Class: Normal  . Order #: [id number] Class: Normal  . Order #: [id number] Class: Print  . Order #: [id number] Class: Normal  . Order #: [id number] Class: Normal  . Order #: [id number] Class: OTC    Allergies Patient has no known allergies.  Family History  Problem Relation Age of Onset  . Cancer Mother   . Heart attack Father     Social History Social History  Substance Use Topics  . Smoking status: Former Smoker    Quit date: 08/05/1986  . Smokeless tobacco: Never Used  . Alcohol use Yes     Comment: samll glass of wine    Review of Systems  Constitutional: Negative for fever, chills, or unexplained weight loss. Eyes: Negative for visual changes.  ENT: Negative for nasal congestion, ear pain, or sore throat. Cardiovascular: Negative for chest pain, palpitations, or extremity swelling. Respiratory: Negative for shortness of breath or cough. Gastrointestinal: Negative for abdominal pain, nausea, vomiting, or diarrhea. Genitourinary: Negative for dysuria, urinary frequency, or hematuria. Musculoskeletal: +R hip and rib pain. Skin: Negative for rash. Neurological: Negative for headaches, dizziness, focal weakness, or numbness/tingling.  Physical Exam   VITAL  SIGNS:   ED Triage Vitals  Enc Vitals Group     BP 05/25/17 1230 116/70     Pulse Rate 05/25/17 1220 66     Resp 05/25/17 1230 16     Temp 05/25/17 1222 97.7 F (36.5 C)     Temp Source 05/25/17 1222 Oral     SpO2 05/25/17 1220 97 %     Weight 05/25/17 1219 180 lb (81.6 kg)     Height 05/25/17 1219 5\' 6"  (1.676 m)     Head Circumference --      Peak Flow --      Pain Score 05/25/17 1218 7     Pain Loc --      Pain Edu? --      Excl. in [place]? --     Constitutional: Alert and oriented. Well appearing and in no respiratory apparent distress. Eyes: PERRL, EOMI, Conjunctivae normal. No nystagmus. No racoon's eyes.  ENT      Head: Normocephalic and atraumatic.      Ears: TM intact bilaterally without erythema or effusion, no hemotympanum, external ear canals normal. No battle's sign.      Nose: No septal hematoma.      Mouth/Throat: Mucous membranes are moist. Oropharynx without erythema or exudate. No trismus. Normal voice, handling secretions normally.      Neck: No cervical midline tenderness, no stepoff. Neck supple, no nuchal signs, full active ROM of neck. No JVD.  Cardiovascular: Normal S1 S2, regular rhythm, normal rate. Normal and symmetric distal pulses are present in all extremities. +pacemaker noted to left chest wall. Respiratory: Breath sounds clear and equal bilaterally. No wheezes, rales, or rhonchi. Normal respiratory effort.  Gastrointestinal: Abdomen soft and nontender. No rebound or guarding. There is no CVA tenderness. Back: No cervical, thoracic, lumbar, sacral, or coccyx midline tenderness, no stepoff. Negative SLR bilaterally. +superficial abrasions to right lateral back, no active bleeding or drainage.  Musculoskeletal: +R anterior hip ttp, full active ROM, nl adduction and abduction, no leg shortening or deformity noted. No erythema, warmth, or ecchymosis. +R posterior ribs (7-9) ttp, no deformity, no crepitus. Nontender with normal range of motion in all other  extremities. Neurologic: Speech clear. Alert and appropriate, no gross focal neurologic deficits are appreciated. No facial asymmetry, finger to nose intact. Gait steady with ambulation. Equal strength in all four extremities. Extremities neurovascularly intact. Strength 5/5 to bilateral UE and LE. Skin: Skin is warm and dry. Psychiatric: Mood and affect are normal. Speech and behavior are normal.  Labs   Labs Reviewed - No data to display  Radiology   No orders to display    EKG   EKG: atrial fibrillation, rate 62bpm, no STEMI.   ED Course, Assessment and Plan   Pt is a 83 y/o M who presents to ED after mechanical slip and fall, now with right hip and rib pain. Normal mental status and non focal neurologic exam at this time. Given head trauma and eliquis, will get CT head for SDH. Doubt CVA/TIA, meningitis/encephalitis, cerebral vein thrombosis, or temporal arteritis at this time. Will get  xray of right hip and pelvis for acute fracture. No evidence of neurovascular compromise. Doubt septic joint or osteomyelitis. Will also get xrays for right ribs to r/o rib fracture. Will plan to give analgesia and re-eval.   3:22 PM CBC and CMP unremarkable, trop negative. Imaging negative for acute injury. CT head negative for ICH. Plan to ambulate. If tolerates, likely dc with PCP follow up.   3:38 PM Pt ambulated with walker per RN. Discussed results, discharge instructions, rx and safety, return precautions, and follow up with patient and daughter. Pt and daughter verbalize understanding using verbal teachback and agrees with plan, denies any additional concerns.   Patient was discussed with Dr. Ellender Hose who has seen patient and agrees with assessment and plan.  Previous chart, nursing notes, and vital signs reviewed.    Pertinent labs & imaging results that were available during my care of the patient were reviewed by me and considered in my medical decision making (see chart for details).    Wojeck, Bernadene Bell, NP 05/29/17 1413    Duffy Bruce, MD 05/29/17 [phone]

## 2017-05-25 NOTE — ED Notes (Signed)
Patient at xray/CT

## 2017-05-25 NOTE — ED Notes (Signed)
Patient transported to X-ray 

## 2017-05-26 DIAGNOSIS — R269 Unspecified abnormalities of gait and mobility: Secondary | ICD-10-CM | POA: Diagnosis not present

## 2017-05-26 DIAGNOSIS — I509 Heart failure, unspecified: Secondary | ICD-10-CM | POA: Diagnosis not present

## 2017-05-26 DIAGNOSIS — I952 Hypotension due to drugs: Secondary | ICD-10-CM | POA: Diagnosis not present

## 2017-05-26 DIAGNOSIS — R609 Edema, unspecified: Secondary | ICD-10-CM | POA: Diagnosis not present

## 2017-05-26 DIAGNOSIS — M545 Low back pain: Secondary | ICD-10-CM | POA: Diagnosis not present

## 2017-05-27 DIAGNOSIS — M6281 Muscle weakness (generalized): Secondary | ICD-10-CM | POA: Diagnosis not present

## 2017-05-27 DIAGNOSIS — M150 Primary generalized (osteo)arthritis: Secondary | ICD-10-CM | POA: Diagnosis not present

## 2017-05-27 DIAGNOSIS — Z79899 Other long term (current) drug therapy: Secondary | ICD-10-CM | POA: Diagnosis not present

## 2017-05-29 DIAGNOSIS — M150 Primary generalized (osteo)arthritis: Secondary | ICD-10-CM | POA: Diagnosis not present

## 2017-05-29 DIAGNOSIS — M6281 Muscle weakness (generalized): Secondary | ICD-10-CM | POA: Diagnosis not present

## 2017-06-01 DIAGNOSIS — M150 Primary generalized (osteo)arthritis: Secondary | ICD-10-CM | POA: Diagnosis not present

## 2017-06-01 DIAGNOSIS — M6281 Muscle weakness (generalized): Secondary | ICD-10-CM | POA: Diagnosis not present

## 2017-06-02 DIAGNOSIS — K219 Gastro-esophageal reflux disease without esophagitis: Secondary | ICD-10-CM | POA: Diagnosis not present

## 2017-06-02 DIAGNOSIS — R2689 Other abnormalities of gait and mobility: Secondary | ICD-10-CM | POA: Diagnosis not present

## 2017-06-02 DIAGNOSIS — R269 Unspecified abnormalities of gait and mobility: Secondary | ICD-10-CM | POA: Diagnosis not present

## 2017-06-02 DIAGNOSIS — Z79899 Other long term (current) drug therapy: Secondary | ICD-10-CM | POA: Diagnosis not present

## 2017-06-02 DIAGNOSIS — I480 Paroxysmal atrial fibrillation: Secondary | ICD-10-CM | POA: Diagnosis not present

## 2017-06-03 DIAGNOSIS — R5381 Other malaise: Secondary | ICD-10-CM | POA: Diagnosis not present

## 2017-06-03 DIAGNOSIS — M6281 Muscle weakness (generalized): Secondary | ICD-10-CM | POA: Diagnosis not present

## 2017-06-03 DIAGNOSIS — R2689 Other abnormalities of gait and mobility: Secondary | ICD-10-CM | POA: Diagnosis not present

## 2017-06-03 DIAGNOSIS — I480 Paroxysmal atrial fibrillation: Secondary | ICD-10-CM | POA: Diagnosis not present

## 2017-06-03 DIAGNOSIS — D649 Anemia, unspecified: Secondary | ICD-10-CM | POA: Diagnosis not present

## 2017-06-03 DIAGNOSIS — M150 Primary generalized (osteo)arthritis: Secondary | ICD-10-CM | POA: Diagnosis not present

## 2017-06-03 DIAGNOSIS — R413 Other amnesia: Secondary | ICD-10-CM | POA: Diagnosis not present

## 2017-06-03 DIAGNOSIS — I5089 Other heart failure: Secondary | ICD-10-CM | POA: Diagnosis not present

## 2017-06-03 DIAGNOSIS — K219 Gastro-esophageal reflux disease without esophagitis: Secondary | ICD-10-CM | POA: Diagnosis not present

## 2017-06-03 DIAGNOSIS — R600 Localized edema: Secondary | ICD-10-CM | POA: Diagnosis not present

## 2017-06-04 DIAGNOSIS — R609 Edema, unspecified: Secondary | ICD-10-CM | POA: Diagnosis not present

## 2017-06-04 DIAGNOSIS — I739 Peripheral vascular disease, unspecified: Secondary | ICD-10-CM | POA: Diagnosis not present

## 2017-06-04 DIAGNOSIS — B351 Tinea unguium: Secondary | ICD-10-CM | POA: Diagnosis not present

## 2017-06-05 DIAGNOSIS — M150 Primary generalized (osteo)arthritis: Secondary | ICD-10-CM | POA: Diagnosis not present

## 2017-06-05 DIAGNOSIS — M6281 Muscle weakness (generalized): Secondary | ICD-10-CM | POA: Diagnosis not present

## 2017-06-08 DIAGNOSIS — M150 Primary generalized (osteo)arthritis: Secondary | ICD-10-CM | POA: Diagnosis not present

## 2017-06-08 DIAGNOSIS — M6281 Muscle weakness (generalized): Secondary | ICD-10-CM | POA: Diagnosis not present

## 2017-06-10 DIAGNOSIS — M150 Primary generalized (osteo)arthritis: Secondary | ICD-10-CM | POA: Diagnosis not present

## 2017-06-10 DIAGNOSIS — M6281 Muscle weakness (generalized): Secondary | ICD-10-CM | POA: Diagnosis not present

## 2017-06-12 DIAGNOSIS — M150 Primary generalized (osteo)arthritis: Secondary | ICD-10-CM | POA: Diagnosis not present

## 2017-06-12 DIAGNOSIS — M6281 Muscle weakness (generalized): Secondary | ICD-10-CM | POA: Diagnosis not present

## 2017-06-15 DIAGNOSIS — E8809 Other disorders of plasma-protein metabolism, not elsewhere classified: Secondary | ICD-10-CM | POA: Diagnosis not present

## 2017-06-15 DIAGNOSIS — M6281 Muscle weakness (generalized): Secondary | ICD-10-CM | POA: Diagnosis not present

## 2017-06-15 DIAGNOSIS — L304 Erythema intertrigo: Secondary | ICD-10-CM | POA: Diagnosis not present

## 2017-06-15 DIAGNOSIS — K4020 Bilateral inguinal hernia, without obstruction or gangrene, not specified as recurrent: Secondary | ICD-10-CM | POA: Diagnosis not present

## 2017-06-15 DIAGNOSIS — M150 Primary generalized (osteo)arthritis: Secondary | ICD-10-CM | POA: Diagnosis not present

## 2017-06-15 DIAGNOSIS — I952 Hypotension due to drugs: Secondary | ICD-10-CM | POA: Diagnosis not present

## 2017-06-17 DIAGNOSIS — M6281 Muscle weakness (generalized): Secondary | ICD-10-CM | POA: Diagnosis not present

## 2017-06-17 DIAGNOSIS — M150 Primary generalized (osteo)arthritis: Secondary | ICD-10-CM | POA: Diagnosis not present

## 2017-06-19 DIAGNOSIS — M6281 Muscle weakness (generalized): Secondary | ICD-10-CM | POA: Diagnosis not present

## 2017-06-19 DIAGNOSIS — M150 Primary generalized (osteo)arthritis: Secondary | ICD-10-CM | POA: Diagnosis not present

## 2017-06-22 DIAGNOSIS — M150 Primary generalized (osteo)arthritis: Secondary | ICD-10-CM | POA: Diagnosis not present

## 2017-06-22 DIAGNOSIS — M6281 Muscle weakness (generalized): Secondary | ICD-10-CM | POA: Diagnosis not present

## 2017-06-24 DIAGNOSIS — M6281 Muscle weakness (generalized): Secondary | ICD-10-CM | POA: Diagnosis not present

## 2017-06-24 DIAGNOSIS — R413 Other amnesia: Secondary | ICD-10-CM | POA: Diagnosis not present

## 2017-06-24 DIAGNOSIS — R2689 Other abnormalities of gait and mobility: Secondary | ICD-10-CM | POA: Diagnosis not present

## 2017-06-24 DIAGNOSIS — M2550 Pain in unspecified joint: Secondary | ICD-10-CM | POA: Diagnosis not present

## 2017-06-24 DIAGNOSIS — K219 Gastro-esophageal reflux disease without esophagitis: Secondary | ICD-10-CM | POA: Diagnosis not present

## 2017-06-24 DIAGNOSIS — I480 Paroxysmal atrial fibrillation: Secondary | ICD-10-CM | POA: Diagnosis not present

## 2017-06-24 DIAGNOSIS — M150 Primary generalized (osteo)arthritis: Secondary | ICD-10-CM | POA: Diagnosis not present

## 2017-06-24 DIAGNOSIS — R600 Localized edema: Secondary | ICD-10-CM | POA: Diagnosis not present

## 2017-06-24 DIAGNOSIS — I5089 Other heart failure: Secondary | ICD-10-CM | POA: Diagnosis not present

## 2017-06-24 DIAGNOSIS — R5381 Other malaise: Secondary | ICD-10-CM | POA: Diagnosis not present

## 2017-06-29 DIAGNOSIS — M6281 Muscle weakness (generalized): Secondary | ICD-10-CM | POA: Diagnosis not present

## 2017-06-29 DIAGNOSIS — M150 Primary generalized (osteo)arthritis: Secondary | ICD-10-CM | POA: Diagnosis not present

## 2017-07-01 DIAGNOSIS — K219 Gastro-esophageal reflux disease without esophagitis: Secondary | ICD-10-CM | POA: Diagnosis not present

## 2017-07-01 DIAGNOSIS — M6281 Muscle weakness (generalized): Secondary | ICD-10-CM | POA: Diagnosis not present

## 2017-07-01 DIAGNOSIS — M150 Primary generalized (osteo)arthritis: Secondary | ICD-10-CM | POA: Diagnosis not present

## 2017-07-01 DIAGNOSIS — I480 Paroxysmal atrial fibrillation: Secondary | ICD-10-CM | POA: Diagnosis not present

## 2017-07-01 DIAGNOSIS — R2689 Other abnormalities of gait and mobility: Secondary | ICD-10-CM | POA: Diagnosis not present

## 2017-07-01 DIAGNOSIS — R5381 Other malaise: Secondary | ICD-10-CM | POA: Diagnosis not present

## 2017-07-01 DIAGNOSIS — I5089 Other heart failure: Secondary | ICD-10-CM | POA: Diagnosis not present

## 2017-07-01 DIAGNOSIS — R600 Localized edema: Secondary | ICD-10-CM | POA: Diagnosis not present

## 2017-07-01 DIAGNOSIS — R413 Other amnesia: Secondary | ICD-10-CM | POA: Diagnosis not present

## 2017-07-01 DIAGNOSIS — E8809 Other disorders of plasma-protein metabolism, not elsewhere classified: Secondary | ICD-10-CM | POA: Diagnosis not present

## 2017-07-06 ENCOUNTER — Ambulatory Visit (INDEPENDENT_AMBULATORY_CARE_PROVIDER_SITE_OTHER): Payer: Medicare Other | Admitting: *Deleted

## 2017-07-06 ENCOUNTER — Telehealth: Payer: Self-pay | Admitting: Internal Medicine

## 2017-07-06 DIAGNOSIS — M150 Primary generalized (osteo)arthritis: Secondary | ICD-10-CM | POA: Diagnosis not present

## 2017-07-06 DIAGNOSIS — I480 Paroxysmal atrial fibrillation: Secondary | ICD-10-CM | POA: Diagnosis not present

## 2017-07-06 DIAGNOSIS — M6281 Muscle weakness (generalized): Secondary | ICD-10-CM | POA: Diagnosis not present

## 2017-07-06 NOTE — Telephone Encounter (Signed)
Transmission received around 1100 today, she is aware and appreciative.

## 2017-07-06 NOTE — Telephone Encounter (Signed)
New message      1. Has your device fired?  no  2. Is you device beeping?  no  3. Are you experiencing draining or swelling at device site? no 4. Are you calling to see if we received your device transmission? yes  5. Have you passed out? No  Pt has been moved to [facility], per daughter they are having trouble to get device to transmitt

## 2017-07-06 NOTE — Progress Notes (Signed)
Remote pacemaker transmission.   

## 2017-07-07 LAB — CUP PACEART REMOTE DEVICE CHECK
Brady Statistic AP VS Percent: 2 %
Brady Statistic AS VP Percent: 1 %
Date Time Interrogation Session: 20180827150407
Implantable Lead Implant Date: 20041129
Implantable Lead Implant Date: 20041129
Implantable Lead Location: 753859
Implantable Lead Location: 753860
Implantable Lead Model: 5076
Lead Channel Pacing Threshold Amplitude: 0.875 V
Lead Channel Pacing Threshold Amplitude: 1.125 V
Lead Channel Pacing Threshold Pulse Width: 0.4 ms
Lead Channel Pacing Threshold Pulse Width: 0.4 ms
Lead Channel Setting Sensing Sensitivity: 2 mV
MDC IDC MSMT BATTERY IMPEDANCE: 418 Ohm
MDC IDC MSMT BATTERY REMAINING LONGEVITY: 82 mo
MDC IDC MSMT BATTERY VOLTAGE: 2.79 V
MDC IDC MSMT LEADCHNL RA IMPEDANCE VALUE: 466 Ohm
MDC IDC MSMT LEADCHNL RV IMPEDANCE VALUE: 567 Ohm
MDC IDC PG IMPLANT DT: 20140623
MDC IDC SET LEADCHNL RA PACING AMPLITUDE: 2 V
MDC IDC SET LEADCHNL RV PACING AMPLITUDE: 2.5 V
MDC IDC SET LEADCHNL RV PACING PULSEWIDTH: 0.4 ms
MDC IDC STAT BRADY AP VP PERCENT: 4 %
MDC IDC STAT BRADY AS VS PERCENT: 93 %

## 2017-07-08 DIAGNOSIS — M6281 Muscle weakness (generalized): Secondary | ICD-10-CM | POA: Diagnosis not present

## 2017-07-08 DIAGNOSIS — M150 Primary generalized (osteo)arthritis: Secondary | ICD-10-CM | POA: Diagnosis not present

## 2017-07-14 DIAGNOSIS — Z79899 Other long term (current) drug therapy: Secondary | ICD-10-CM | POA: Diagnosis not present

## 2017-07-15 DIAGNOSIS — M6281 Muscle weakness (generalized): Secondary | ICD-10-CM | POA: Diagnosis not present

## 2017-07-15 DIAGNOSIS — M150 Primary generalized (osteo)arthritis: Secondary | ICD-10-CM | POA: Diagnosis not present

## 2017-07-16 DIAGNOSIS — M150 Primary generalized (osteo)arthritis: Secondary | ICD-10-CM | POA: Diagnosis not present

## 2017-07-16 DIAGNOSIS — M6281 Muscle weakness (generalized): Secondary | ICD-10-CM | POA: Diagnosis not present

## 2017-07-17 ENCOUNTER — Encounter: Payer: Self-pay | Admitting: Cardiology

## 2017-07-20 DIAGNOSIS — M150 Primary generalized (osteo)arthritis: Secondary | ICD-10-CM | POA: Diagnosis not present

## 2017-07-20 DIAGNOSIS — M6281 Muscle weakness (generalized): Secondary | ICD-10-CM | POA: Diagnosis not present

## 2017-07-21 ENCOUNTER — Ambulatory Visit: Payer: Medicare Other | Admitting: Podiatry

## 2017-07-22 DIAGNOSIS — M6281 Muscle weakness (generalized): Secondary | ICD-10-CM | POA: Diagnosis not present

## 2017-07-22 DIAGNOSIS — M150 Primary generalized (osteo)arthritis: Secondary | ICD-10-CM | POA: Diagnosis not present

## 2017-07-27 DIAGNOSIS — M150 Primary generalized (osteo)arthritis: Secondary | ICD-10-CM | POA: Diagnosis not present

## 2017-07-27 DIAGNOSIS — M6281 Muscle weakness (generalized): Secondary | ICD-10-CM | POA: Diagnosis not present

## 2017-07-29 DIAGNOSIS — C61 Malignant neoplasm of prostate: Secondary | ICD-10-CM | POA: Diagnosis not present

## 2017-07-29 DIAGNOSIS — M150 Primary generalized (osteo)arthritis: Secondary | ICD-10-CM | POA: Diagnosis not present

## 2017-07-29 DIAGNOSIS — R338 Other retention of urine: Secondary | ICD-10-CM | POA: Diagnosis not present

## 2017-07-29 DIAGNOSIS — N401 Enlarged prostate with lower urinary tract symptoms: Secondary | ICD-10-CM | POA: Diagnosis not present

## 2017-07-29 DIAGNOSIS — M6281 Muscle weakness (generalized): Secondary | ICD-10-CM | POA: Diagnosis not present

## 2017-08-03 DIAGNOSIS — M6281 Muscle weakness (generalized): Secondary | ICD-10-CM | POA: Diagnosis not present

## 2017-08-03 DIAGNOSIS — M150 Primary generalized (osteo)arthritis: Secondary | ICD-10-CM | POA: Diagnosis not present

## 2017-08-05 DIAGNOSIS — M6281 Muscle weakness (generalized): Secondary | ICD-10-CM | POA: Diagnosis not present

## 2017-08-05 DIAGNOSIS — M150 Primary generalized (osteo)arthritis: Secondary | ICD-10-CM | POA: Diagnosis not present

## 2017-08-10 DIAGNOSIS — M6281 Muscle weakness (generalized): Secondary | ICD-10-CM | POA: Diagnosis not present

## 2017-08-10 DIAGNOSIS — M150 Primary generalized (osteo)arthritis: Secondary | ICD-10-CM | POA: Diagnosis not present

## 2017-08-13 DIAGNOSIS — M6281 Muscle weakness (generalized): Secondary | ICD-10-CM | POA: Diagnosis not present

## 2017-08-13 DIAGNOSIS — M150 Primary generalized (osteo)arthritis: Secondary | ICD-10-CM | POA: Diagnosis not present

## 2017-08-17 DIAGNOSIS — M150 Primary generalized (osteo)arthritis: Secondary | ICD-10-CM | POA: Diagnosis not present

## 2017-08-17 DIAGNOSIS — M6281 Muscle weakness (generalized): Secondary | ICD-10-CM | POA: Diagnosis not present

## 2017-08-20 DIAGNOSIS — M150 Primary generalized (osteo)arthritis: Secondary | ICD-10-CM | POA: Diagnosis not present

## 2017-08-20 DIAGNOSIS — M6281 Muscle weakness (generalized): Secondary | ICD-10-CM | POA: Diagnosis not present

## 2017-08-24 DIAGNOSIS — I739 Peripheral vascular disease, unspecified: Secondary | ICD-10-CM | POA: Diagnosis not present

## 2017-08-24 DIAGNOSIS — R262 Difficulty in walking, not elsewhere classified: Secondary | ICD-10-CM | POA: Diagnosis not present

## 2017-08-24 DIAGNOSIS — R609 Edema, unspecified: Secondary | ICD-10-CM | POA: Diagnosis not present

## 2017-08-24 DIAGNOSIS — M150 Primary generalized (osteo)arthritis: Secondary | ICD-10-CM | POA: Diagnosis not present

## 2017-08-24 DIAGNOSIS — M6281 Muscle weakness (generalized): Secondary | ICD-10-CM | POA: Diagnosis not present

## 2017-08-24 DIAGNOSIS — B351 Tinea unguium: Secondary | ICD-10-CM | POA: Diagnosis not present

## 2017-08-26 DIAGNOSIS — M6281 Muscle weakness (generalized): Secondary | ICD-10-CM | POA: Diagnosis not present

## 2017-08-26 DIAGNOSIS — M150 Primary generalized (osteo)arthritis: Secondary | ICD-10-CM | POA: Diagnosis not present

## 2017-08-31 DIAGNOSIS — M6281 Muscle weakness (generalized): Secondary | ICD-10-CM | POA: Diagnosis not present

## 2017-08-31 DIAGNOSIS — M150 Primary generalized (osteo)arthritis: Secondary | ICD-10-CM | POA: Diagnosis not present

## 2017-09-02 DIAGNOSIS — M150 Primary generalized (osteo)arthritis: Secondary | ICD-10-CM | POA: Diagnosis not present

## 2017-09-02 DIAGNOSIS — M6281 Muscle weakness (generalized): Secondary | ICD-10-CM | POA: Diagnosis not present

## 2017-09-03 DIAGNOSIS — Z23 Encounter for immunization: Secondary | ICD-10-CM | POA: Diagnosis not present

## 2017-09-07 DIAGNOSIS — I9589 Other hypotension: Secondary | ICD-10-CM | POA: Diagnosis not present

## 2017-09-07 DIAGNOSIS — G3184 Mild cognitive impairment, so stated: Secondary | ICD-10-CM | POA: Diagnosis not present

## 2017-09-07 DIAGNOSIS — L22 Diaper dermatitis: Secondary | ICD-10-CM | POA: Diagnosis not present

## 2017-09-08 DIAGNOSIS — M150 Primary generalized (osteo)arthritis: Secondary | ICD-10-CM | POA: Diagnosis not present

## 2017-09-08 DIAGNOSIS — M6281 Muscle weakness (generalized): Secondary | ICD-10-CM | POA: Diagnosis not present

## 2017-09-14 DIAGNOSIS — R4182 Altered mental status, unspecified: Secondary | ICD-10-CM | POA: Diagnosis not present

## 2017-09-14 DIAGNOSIS — R634 Abnormal weight loss: Secondary | ICD-10-CM | POA: Diagnosis not present

## 2017-09-14 DIAGNOSIS — I9589 Other hypotension: Secondary | ICD-10-CM | POA: Diagnosis not present

## 2017-09-15 DIAGNOSIS — R399 Unspecified symptoms and signs involving the genitourinary system: Secondary | ICD-10-CM | POA: Diagnosis not present

## 2017-10-05 ENCOUNTER — Telehealth: Payer: Self-pay | Admitting: Cardiology

## 2017-10-05 ENCOUNTER — Ambulatory Visit (INDEPENDENT_AMBULATORY_CARE_PROVIDER_SITE_OTHER): Payer: Medicare Other | Admitting: *Deleted

## 2017-10-05 DIAGNOSIS — R001 Bradycardia, unspecified: Secondary | ICD-10-CM

## 2017-10-05 DIAGNOSIS — I480 Paroxysmal atrial fibrillation: Secondary | ICD-10-CM

## 2017-10-05 NOTE — Telephone Encounter (Signed)
Confirmed remote transmission w/ pt daughter.   

## 2017-10-06 DIAGNOSIS — Z79899 Other long term (current) drug therapy: Secondary | ICD-10-CM | POA: Diagnosis not present

## 2017-10-06 NOTE — Progress Notes (Signed)
Remote pacemaker transmission.   

## 2017-10-08 LAB — CUP PACEART REMOTE DEVICE CHECK
Battery Voltage: 2.78 V
Brady Statistic AP VP Percent: 6 %
Brady Statistic AS VS Percent: 92 %
Implantable Lead Implant Date: 20041129
Implantable Lead Location: 753859
Implantable Lead Location: 753860
Implantable Lead Model: 5076
Lead Channel Impedance Value: 465 Ohm
Lead Channel Pacing Threshold Amplitude: 0.875 V
Lead Channel Pacing Threshold Amplitude: 1.125 V
Lead Channel Pacing Threshold Pulse Width: 0.4 ms
Lead Channel Pacing Threshold Pulse Width: 0.4 ms
Lead Channel Sensing Intrinsic Amplitude: 0.7 mV
Lead Channel Sensing Intrinsic Amplitude: 5.6 mV
MDC IDC LEAD IMPLANT DT: 20041129
MDC IDC MSMT BATTERY IMPEDANCE: 467 Ohm
MDC IDC MSMT BATTERY REMAINING LONGEVITY: 78 mo
MDC IDC MSMT LEADCHNL RV IMPEDANCE VALUE: 567 Ohm
MDC IDC PG IMPLANT DT: 20140623
MDC IDC SESS DTM: 20181126185309
MDC IDC SET LEADCHNL RA PACING AMPLITUDE: 2 V
MDC IDC SET LEADCHNL RV PACING AMPLITUDE: 2.5 V
MDC IDC SET LEADCHNL RV PACING PULSEWIDTH: 0.4 ms
MDC IDC SET LEADCHNL RV SENSING SENSITIVITY: 2 mV
MDC IDC STAT BRADY AP VS PERCENT: 2 %
MDC IDC STAT BRADY AS VP PERCENT: 1 %

## 2017-10-09 ENCOUNTER — Encounter: Payer: Self-pay | Admitting: Cardiology

## 2017-10-12 DIAGNOSIS — F0280 Dementia in other diseases classified elsewhere without behavioral disturbance: Secondary | ICD-10-CM | POA: Diagnosis not present

## 2017-10-12 DIAGNOSIS — R748 Abnormal levels of other serum enzymes: Secondary | ICD-10-CM | POA: Diagnosis not present

## 2017-10-12 DIAGNOSIS — I480 Paroxysmal atrial fibrillation: Secondary | ICD-10-CM | POA: Diagnosis not present

## 2017-10-12 DIAGNOSIS — Z8379 Family history of other diseases of the digestive system: Secondary | ICD-10-CM | POA: Diagnosis not present

## 2017-10-12 DIAGNOSIS — I509 Heart failure, unspecified: Secondary | ICD-10-CM | POA: Diagnosis not present

## 2017-10-29 DIAGNOSIS — I739 Peripheral vascular disease, unspecified: Secondary | ICD-10-CM | POA: Diagnosis not present

## 2017-10-29 DIAGNOSIS — B351 Tinea unguium: Secondary | ICD-10-CM | POA: Diagnosis not present

## 2017-11-12 DIAGNOSIS — R627 Adult failure to thrive: Secondary | ICD-10-CM | POA: Diagnosis not present

## 2017-11-12 DIAGNOSIS — J060 Acute laryngopharyngitis: Secondary | ICD-10-CM | POA: Diagnosis not present

## 2017-11-12 DIAGNOSIS — F0390 Unspecified dementia without behavioral disturbance: Secondary | ICD-10-CM | POA: Diagnosis not present

## 2017-11-12 DIAGNOSIS — G3184 Mild cognitive impairment, so stated: Secondary | ICD-10-CM | POA: Diagnosis not present

## 2017-11-12 DIAGNOSIS — R269 Unspecified abnormalities of gait and mobility: Secondary | ICD-10-CM | POA: Diagnosis not present

## 2017-11-12 DIAGNOSIS — Z7409 Other reduced mobility: Secondary | ICD-10-CM | POA: Diagnosis not present

## 2017-11-12 DIAGNOSIS — M6281 Muscle weakness (generalized): Secondary | ICD-10-CM | POA: Diagnosis not present

## 2017-12-02 DIAGNOSIS — E039 Hypothyroidism, unspecified: Secondary | ICD-10-CM | POA: Diagnosis not present

## 2017-12-02 DIAGNOSIS — F0390 Unspecified dementia without behavioral disturbance: Secondary | ICD-10-CM | POA: Diagnosis not present

## 2017-12-02 DIAGNOSIS — I509 Heart failure, unspecified: Secondary | ICD-10-CM | POA: Diagnosis not present

## 2018-01-04 ENCOUNTER — Telehealth: Payer: Self-pay | Admitting: Cardiology

## 2018-01-04 ENCOUNTER — Ambulatory Visit (INDEPENDENT_AMBULATORY_CARE_PROVIDER_SITE_OTHER): Payer: Medicare Other | Admitting: *Deleted

## 2018-01-04 DIAGNOSIS — R001 Bradycardia, unspecified: Secondary | ICD-10-CM

## 2018-01-04 NOTE — Telephone Encounter (Signed)
Confirmed remote transmission w/ pt daughter.   

## 2018-01-05 DIAGNOSIS — I739 Peripheral vascular disease, unspecified: Secondary | ICD-10-CM | POA: Diagnosis not present

## 2018-01-05 DIAGNOSIS — B351 Tinea unguium: Secondary | ICD-10-CM | POA: Diagnosis not present

## 2018-01-05 NOTE — Progress Notes (Signed)
Remote pacemaker transmission.   

## 2018-01-07 ENCOUNTER — Encounter: Payer: Self-pay | Admitting: Cardiology

## 2018-01-07 NOTE — Progress Notes (Signed)
Letter  

## 2018-01-18 DIAGNOSIS — Z79899 Other long term (current) drug therapy: Secondary | ICD-10-CM | POA: Diagnosis not present

## 2018-01-18 DIAGNOSIS — E039 Hypothyroidism, unspecified: Secondary | ICD-10-CM | POA: Diagnosis not present

## 2018-01-19 LAB — CUP PACEART REMOTE DEVICE CHECK
Battery Remaining Longevity: 73 mo
Brady Statistic AP VS Percent: 3 %
Brady Statistic AS VP Percent: 1 %
Brady Statistic AS VS Percent: 89 %
Implantable Lead Implant Date: 20041129
Implantable Lead Location: 753860
Implantable Lead Model: 5076
Lead Channel Impedance Value: 564 Ohm
Lead Channel Pacing Threshold Pulse Width: 0.4 ms
Lead Channel Pacing Threshold Pulse Width: 0.4 ms
Lead Channel Setting Pacing Amplitude: 2 V
Lead Channel Setting Sensing Sensitivity: 2 mV
MDC IDC LEAD IMPLANT DT: 20041129
MDC IDC LEAD LOCATION: 753859
MDC IDC MSMT BATTERY IMPEDANCE: 542 Ohm
MDC IDC MSMT BATTERY VOLTAGE: 2.78 V
MDC IDC MSMT LEADCHNL RA IMPEDANCE VALUE: 472 Ohm
MDC IDC MSMT LEADCHNL RA PACING THRESHOLD AMPLITUDE: 0.875 V
MDC IDC MSMT LEADCHNL RV PACING THRESHOLD AMPLITUDE: 1 V
MDC IDC PG IMPLANT DT: 20140623
MDC IDC SESS DTM: 20190225230757
MDC IDC SET LEADCHNL RV PACING AMPLITUDE: 2.5 V
MDC IDC SET LEADCHNL RV PACING PULSEWIDTH: 0.4 ms
MDC IDC STAT BRADY AP VP PERCENT: 7 %

## 2018-01-27 ENCOUNTER — Telehealth: Payer: Self-pay

## 2018-01-27 DIAGNOSIS — Z5111 Encounter for antineoplastic chemotherapy: Secondary | ICD-10-CM | POA: Diagnosis not present

## 2018-01-27 DIAGNOSIS — C61 Malignant neoplasm of prostate: Secondary | ICD-10-CM | POA: Diagnosis not present

## 2018-01-27 NOTE — Telephone Encounter (Signed)
Immunizations mailed.

## 2018-01-27 NOTE — Telephone Encounter (Signed)
Copied from [place] [phone]. Topic: General - Other >> Jan 27, 2018 10:04 AM Vernona Rieger wrote: Patient's daughter Magda Paganini said that he is now seeing his doctor through the [facility]. (Dr Delice Lesch). She said that she needs his immuzations record.  It can be mailed to the [place] , [place] [address]

## 2018-02-15 DIAGNOSIS — S51809A Unspecified open wound of unspecified forearm, initial encounter: Secondary | ICD-10-CM | POA: Diagnosis not present

## 2018-02-15 DIAGNOSIS — Z7901 Long term (current) use of anticoagulants: Secondary | ICD-10-CM | POA: Diagnosis not present

## 2018-02-15 DIAGNOSIS — I4891 Unspecified atrial fibrillation: Secondary | ICD-10-CM | POA: Diagnosis not present

## 2018-02-15 DIAGNOSIS — F0390 Unspecified dementia without behavioral disturbance: Secondary | ICD-10-CM | POA: Diagnosis not present

## 2018-02-15 DIAGNOSIS — H9193 Unspecified hearing loss, bilateral: Secondary | ICD-10-CM | POA: Diagnosis not present

## 2018-02-16 ENCOUNTER — Encounter: Payer: Self-pay | Admitting: Internal Medicine

## 2018-02-16 ENCOUNTER — Ambulatory Visit (INDEPENDENT_AMBULATORY_CARE_PROVIDER_SITE_OTHER): Payer: Medicare Other | Admitting: Internal Medicine

## 2018-02-16 VITALS — BP 88/52 | HR 65 | Ht 66.0 in | Wt 149.6 lb

## 2018-02-16 DIAGNOSIS — R001 Bradycardia, unspecified: Secondary | ICD-10-CM | POA: Diagnosis not present

## 2018-02-16 DIAGNOSIS — I480 Paroxysmal atrial fibrillation: Secondary | ICD-10-CM

## 2018-02-16 DIAGNOSIS — Z950 Presence of cardiac pacemaker: Secondary | ICD-10-CM | POA: Diagnosis not present

## 2018-02-16 LAB — CUP PACEART INCLINIC DEVICE CHECK
Date Time Interrogation Session: 20190409160124
Implantable Lead Implant Date: 20041129
Implantable Lead Location: 753859
Implantable Lead Location: 753860
Implantable Lead Model: 5076
Implantable Pulse Generator Implant Date: 20140623
MDC IDC LEAD IMPLANT DT: 20041129
MDC IDC SET LEADCHNL RA PACING AMPLITUDE: 2 V
MDC IDC SET LEADCHNL RV PACING AMPLITUDE: 2.5 V
MDC IDC SET LEADCHNL RV PACING PULSEWIDTH: 0.4 ms
MDC IDC SET LEADCHNL RV SENSING SENSITIVITY: 2.8 mV

## 2018-02-16 NOTE — Patient Instructions (Signed)

## 2018-02-16 NOTE — Progress Notes (Signed)
HPI [name] returns today for followup. He is a very pleasant 84 year old man with a history of symptomatic bradycardia, status post permanent pacemaker insertion. He also is a history of hypertension, coronary disease, and prostate cancer. In the interim, he has had a progressive decrease in his hearing. He is deaf.  He has moved to a SNF. No Known Allergies   Current Outpatient Medications  Medication Sig Dispense Refill  . acetaminophen (TYLENOL) 325 MG tablet Take 650 mg by mouth every 6 (six) hours as needed (for pain).     Marland Kitchen apixaban (ELIQUIS) 5 MG TABS tablet Take 1 tablet (5 mg total) by mouth 2 (two) times daily. 180 tablet 3  . furosemide (LASIX) 20 MG tablet Take 20 mg by mouth daily with breakfast.     . Levothyroxine Sodium (SYNTHROID PO) Take 1 tablet by mouth daily before breakfast.    . LUTEIN-ZEAXANTHIN PO Take 1 tablet by mouth daily with breakfast.    . mirtazapine (REMERON) 15 MG tablet Take 1 tablet (15 mg total) by mouth at bedtime. 30 tablet 3  . nitroGLYCERIN (NITROSTAT) 0.4 MG SL tablet Place 1 tablet (0.4 mg total) under the tongue every 5 (five) minutes as needed. 25 tablet 3  . nystatin cream (MYCOSTATIN) Apply to affected area 2 times daily 30 g 0  . potassium chloride (K-DUR) 10 MEQ tablet TAKE 1 TABLET (10 MEQ TOTAL) BY MOUTH DAILY. 90 tablet 3  . pravastatin (PRAVACHOL) 10 MG tablet Take 10 mg by mouth daily.    . ranitidine (ZANTAC) 150 MG tablet TAKE 1 TABLET (150 MG TOTAL) BY MOUTH AT BEDTIME. 30 tablet 10  . vitamin C (ASCORBIC ACID) 500 MG tablet Take 500 mg by mouth every morning.     No current facility-administered medications for this visit.      Past Medical History:  Diagnosis Date  . Adenocarcinoma of prostate ([place]) 10/2005   lupron injections q34mo  . Arthritis    ?rheumatoid, never dx or on DMARDs  . Atrial fibrillation ([facility])    chronic anticoag  . Barrett's esophagus   . CAD (coronary artery disease)    CABG 08/1973  . CHF  (congestive heart failure) ([place])    ICM  . Chronic renal insufficiency   . Diverticulosis of colon   . GERD (gastroesophageal reflux disease)   . Hyperlipidemia   . Hypertension   . Memory loss   . Osteoporosis   . Pacemaker 09/2003   RESULTS:  This demonstrates successful implantation of a Medtronic dual  . Paroxysmal atrial fibrillation ([place])   . Unsteady gait    due to B ankle DJD and instability    ROS:   All systems reviewed and negative except as noted in the HPI.   Past Surgical History:  Procedure Laterality Date  . CORONARY ARTERY BYPASS GRAFT  08/1973   3 heart heart arteries were 80 percent clogged. the lower valve of my heart is not functioning  . DOPPLER ECHOCARDIOGRAPHY  2004, 2008  . HERNIA REPAIR     age 54 ( not sure if it was right or left side)  . PACEMAKER GENERATOR CHANGE N/A 05/02/2013   Procedure: PACEMAKER GENERATOR CHANGE;  Surgeon: Evans Lance, MD;  Location: [facility] CATH LAB;  Service: Cardiovascular;  Laterality: N/A;  . PACEMAKER PLACEMENT  09/2003     Family History  Problem Relation Age of Onset  . Cancer Mother   . Heart attack Father  Social History   Socioeconomic History  . Marital status: Married    Spouse name: Not on file  . Number of children: Not on file  . Years of education: Not on file  . Highest education level: Not on file  Occupational History  . Not on file  Social Needs  . Financial resource strain: Not on file  . Food insecurity:    Worry: Not on file    Inability: Not on file  . Transportation needs:    Medical: Not on file    Non-medical: Not on file  Tobacco Use  . Smoking status: Former Smoker    Last attempt to quit: 08/05/1986    Years since quitting: 31.5  . Smokeless tobacco: Never Used  Substance and Sexual Activity  . Alcohol use: Yes    Comment: samll glass of wine  . Drug use: Not on file  . Sexual activity: Not on file  Lifestyle  . Physical activity:    Days per week: Not on file     Minutes per session: Not on file  . Stress: Not on file  Relationships  . Social connections:    Talks on phone: Not on file    Gets together: Not on file    Attends religious service: Not on file    Active member of club or organization: Not on file    Attends meetings of clubs or organizations: Not on file    Relationship status: Not on file  . Intimate partner violence:    Fear of current or ex partner: Not on file    Emotionally abused: Not on file    Physically abused: Not on file    Forced sexual activity: Not on file  Other Topics Concern  . Not on file  Social History Narrative   Married  - lives with spouse and supportive daugher   Former Smoker -  quit tobacco 25 years ago     Alcohol use-yes (small glass of wine)                    BP (!) 88/52   Pulse 65   Ht 5\' 6"  (1.676 m)   Wt 149 lb 9.6 oz (67.9 kg)   SpO2 98%   BMI 24.15 kg/m   Physical Exam:  Well appearing elderly man, NAD HEENT: Unremarkable Neck:  6 cm JVD, no thyromegally Lymphatics:  No adenopathy Back:  No CVA tenderness Lungs:  Clear with no wheezes HEART:  Regular rate rhythm, no murmurs, no rubs, no clicks Abd:  soft, positive bowel sounds, no organomegally, no rebound, no guarding Ext:  2 plus pulses, 2+ edema, no cyanosis, no clubbing Skin:  No rashes no nodules Neuro:  CN II through XII intact, motor grossly intact  EKG - atrial fib with ventricular pacing  DEVICE  Normal device function.  See PaceArt for details.   Assess/Plan: 1. CHB - he is asymptomatic, s/p PPM insertion. 2. PPM - his medtronic DDD pacemaker is programmed DDDR and mode switched and working normally. 3. Atrial fib - his ventricular ate is well controlled. He has not had any bleeding on eliquis.  4. Diastolic heart failure - his symptoms are class 2. He is sedentary. He will continue his current meds. I have asked him to reduce his salt intake.  Mikle Bosworth.D.

## 2018-03-16 DIAGNOSIS — B351 Tinea unguium: Secondary | ICD-10-CM | POA: Diagnosis not present

## 2018-03-16 DIAGNOSIS — I739 Peripheral vascular disease, unspecified: Secondary | ICD-10-CM | POA: Diagnosis not present

## 2018-04-07 ENCOUNTER — Ambulatory Visit (INDEPENDENT_AMBULATORY_CARE_PROVIDER_SITE_OTHER): Payer: Medicare Other | Admitting: *Deleted

## 2018-04-07 ENCOUNTER — Telehealth: Payer: Self-pay

## 2018-04-07 DIAGNOSIS — R001 Bradycardia, unspecified: Secondary | ICD-10-CM | POA: Diagnosis not present

## 2018-04-07 NOTE — Telephone Encounter (Signed)
Confirmed remote transmission w/ pt daughter. She would like for the remote transmissions to be scheduled for fridays. That when she go to see him at the [facility].

## 2018-04-09 LAB — CUP PACEART REMOTE DEVICE CHECK
Battery Voltage: 2.78 V
Brady Statistic AP VP Percent: 17 %
Brady Statistic AP VS Percent: 4 %
Brady Statistic AS VP Percent: 1 %
Date Time Interrogation Session: 20190531164355
Implantable Lead Implant Date: 20041129
Implantable Lead Location: 753859
Implantable Lead Location: 753860
Implantable Lead Model: 5076
Implantable Pulse Generator Implant Date: 20140623
Lead Channel Impedance Value: 465 Ohm
Lead Channel Pacing Threshold Amplitude: 0.875 V
Lead Channel Pacing Threshold Amplitude: 1.125 V
Lead Channel Pacing Threshold Pulse Width: 0.4 ms
Lead Channel Pacing Threshold Pulse Width: 0.4 ms
Lead Channel Setting Pacing Pulse Width: 0.4 ms
MDC IDC LEAD IMPLANT DT: 20041129
MDC IDC MSMT BATTERY IMPEDANCE: 669 Ohm
MDC IDC MSMT BATTERY REMAINING LONGEVITY: 64 mo
MDC IDC MSMT LEADCHNL RV IMPEDANCE VALUE: 567 Ohm
MDC IDC SET LEADCHNL RA PACING AMPLITUDE: 2 V
MDC IDC SET LEADCHNL RV PACING AMPLITUDE: 2.5 V
MDC IDC SET LEADCHNL RV SENSING SENSITIVITY: 2.8 mV
MDC IDC STAT BRADY AS VS PERCENT: 78 %

## 2018-04-09 NOTE — Progress Notes (Signed)
Remote pacemaker transmission.   

## 2018-04-14 DIAGNOSIS — H6123 Impacted cerumen, bilateral: Secondary | ICD-10-CM | POA: Diagnosis not present

## 2018-05-20 DIAGNOSIS — R600 Localized edema: Secondary | ICD-10-CM | POA: Diagnosis not present

## 2018-05-20 DIAGNOSIS — I739 Peripheral vascular disease, unspecified: Secondary | ICD-10-CM | POA: Diagnosis not present

## 2018-05-20 DIAGNOSIS — B351 Tinea unguium: Secondary | ICD-10-CM | POA: Diagnosis not present

## 2018-06-03 DIAGNOSIS — I480 Paroxysmal atrial fibrillation: Secondary | ICD-10-CM | POA: Diagnosis not present

## 2018-06-03 DIAGNOSIS — K210 Gastro-esophageal reflux disease with esophagitis: Secondary | ICD-10-CM | POA: Diagnosis not present

## 2018-06-03 DIAGNOSIS — E039 Hypothyroidism, unspecified: Secondary | ICD-10-CM | POA: Diagnosis not present

## 2018-06-03 DIAGNOSIS — Z79899 Other long term (current) drug therapy: Secondary | ICD-10-CM | POA: Diagnosis not present

## 2018-06-03 DIAGNOSIS — L309 Dermatitis, unspecified: Secondary | ICD-10-CM | POA: Diagnosis not present

## 2018-06-08 ENCOUNTER — Telehealth: Payer: Self-pay

## 2018-06-08 NOTE — Telephone Encounter (Signed)
Pt daughter called stated she went ahead and sent a transmission because she will be out of town for two months. I tried to call her back to let her know that it is too early and the transmission would not be billable. I reschedule the appointment for August 05, 2018.

## 2018-06-21 DIAGNOSIS — R269 Unspecified abnormalities of gait and mobility: Secondary | ICD-10-CM | POA: Diagnosis not present

## 2018-06-21 DIAGNOSIS — S61402A Unspecified open wound of left hand, initial encounter: Secondary | ICD-10-CM | POA: Diagnosis not present

## 2018-06-21 DIAGNOSIS — R4189 Other symptoms and signs involving cognitive functions and awareness: Secondary | ICD-10-CM | POA: Diagnosis not present

## 2018-06-23 NOTE — Telephone Encounter (Signed)
Attempted to confirm remote transmission with pt. No answer and was unable to leave a message.   

## 2018-08-03 DIAGNOSIS — R209 Unspecified disturbances of skin sensation: Secondary | ICD-10-CM | POA: Diagnosis not present

## 2018-08-03 DIAGNOSIS — B351 Tinea unguium: Secondary | ICD-10-CM | POA: Diagnosis not present

## 2018-08-03 DIAGNOSIS — I739 Peripheral vascular disease, unspecified: Secondary | ICD-10-CM | POA: Diagnosis not present

## 2018-08-03 DIAGNOSIS — R600 Localized edema: Secondary | ICD-10-CM | POA: Diagnosis not present

## 2018-08-11 DIAGNOSIS — C61 Malignant neoplasm of prostate: Secondary | ICD-10-CM | POA: Diagnosis not present

## 2018-08-12 ENCOUNTER — Telehealth: Payer: Self-pay | Admitting: Internal Medicine

## 2018-08-12 ENCOUNTER — Encounter: Payer: Medicare Other | Admitting: *Deleted

## 2018-08-12 ENCOUNTER — Telehealth: Payer: Self-pay | Admitting: Cardiology

## 2018-08-12 NOTE — Telephone Encounter (Signed)
   1. Has your device fired? no  2. Is you device beeping? no  3. Are you experiencing draining or swelling at device site? no  4. Are you calling to see if we received your device transmission?yes  5. Have you passed out? No  Daughter called because she couldn't get the device to transmit today. She's not sure what is wrong.   Please route to [place]

## 2018-08-12 NOTE — Telephone Encounter (Signed)
Confirmed remote transmission w/ pt daughter.   

## 2018-08-13 NOTE — Telephone Encounter (Signed)
Daughter called back and stated that Med[employer]onic is sending out a new monitor. Remote transmission scheduled for 3 weeks out.

## 2018-08-13 NOTE — Telephone Encounter (Signed)
Pt daughter called [employer] tech support and they ordered him a new monitor. It should be delivered within a week.

## 2018-08-31 DIAGNOSIS — Z79899 Other long term (current) drug therapy: Secondary | ICD-10-CM | POA: Diagnosis not present

## 2018-09-03 ENCOUNTER — Ambulatory Visit (INDEPENDENT_AMBULATORY_CARE_PROVIDER_SITE_OTHER): Payer: Medicare Other | Admitting: *Deleted

## 2018-09-03 DIAGNOSIS — R001 Bradycardia, unspecified: Secondary | ICD-10-CM

## 2018-09-03 DIAGNOSIS — I442 Atrioventricular block, complete: Secondary | ICD-10-CM

## 2018-09-03 NOTE — Progress Notes (Signed)
Remote pacemaker transmission.   

## 2018-09-06 ENCOUNTER — Encounter: Payer: Self-pay | Admitting: Cardiology

## 2018-09-14 DIAGNOSIS — Z79899 Other long term (current) drug therapy: Secondary | ICD-10-CM | POA: Diagnosis not present

## 2018-09-14 DIAGNOSIS — E039 Hypothyroidism, unspecified: Secondary | ICD-10-CM | POA: Diagnosis not present

## 2018-09-17 DIAGNOSIS — Z23 Encounter for immunization: Secondary | ICD-10-CM | POA: Diagnosis not present

## 2018-09-20 DIAGNOSIS — I350 Nonrheumatic aortic (valve) stenosis: Secondary | ICD-10-CM | POA: Diagnosis not present

## 2018-09-20 DIAGNOSIS — R945 Abnormal results of liver function studies: Secondary | ICD-10-CM | POA: Diagnosis not present

## 2018-09-20 DIAGNOSIS — E039 Hypothyroidism, unspecified: Secondary | ICD-10-CM | POA: Diagnosis not present

## 2018-09-20 DIAGNOSIS — I480 Paroxysmal atrial fibrillation: Secondary | ICD-10-CM | POA: Diagnosis not present

## 2018-09-24 LAB — CUP PACEART REMOTE DEVICE CHECK
Battery Impedance: 796 Ohm
Battery Remaining Longevity: 58 mo
Battery Voltage: 2.78 V
Brady Statistic AP VP Percent: 21 %
Brady Statistic AP VS Percent: 4 %
Implantable Lead Implant Date: 20041129
Implantable Lead Location: 753859
Implantable Lead Location: 753860
Implantable Lead Model: 5076
Implantable Lead Model: 5076
Implantable Pulse Generator Implant Date: 20140623
Lead Channel Impedance Value: 472 Ohm
Lead Channel Impedance Value: 563 Ohm
Lead Channel Pacing Threshold Amplitude: 0.875 V
Lead Channel Pacing Threshold Pulse Width: 0.4 ms
Lead Channel Sensing Intrinsic Amplitude: 0.7 mV
Lead Channel Sensing Intrinsic Amplitude: 5.6 mV
MDC IDC LEAD IMPLANT DT: 20041129
MDC IDC MSMT LEADCHNL RV PACING THRESHOLD AMPLITUDE: 1.125 V
MDC IDC MSMT LEADCHNL RV PACING THRESHOLD PULSEWIDTH: 0.4 ms
MDC IDC SESS DTM: 20191025163612
MDC IDC SET LEADCHNL RA PACING AMPLITUDE: 2 V
MDC IDC SET LEADCHNL RV PACING AMPLITUDE: 2.5 V
MDC IDC SET LEADCHNL RV PACING PULSEWIDTH: 0.4 ms
MDC IDC SET LEADCHNL RV SENSING SENSITIVITY: 2 mV
MDC IDC STAT BRADY AS VP PERCENT: 2 %
MDC IDC STAT BRADY AS VS PERCENT: 74 %

## 2018-09-28 DIAGNOSIS — R945 Abnormal results of liver function studies: Secondary | ICD-10-CM | POA: Diagnosis not present

## 2018-10-12 DIAGNOSIS — I739 Peripheral vascular disease, unspecified: Secondary | ICD-10-CM | POA: Diagnosis not present

## 2018-10-12 DIAGNOSIS — R209 Unspecified disturbances of skin sensation: Secondary | ICD-10-CM | POA: Diagnosis not present

## 2018-10-12 DIAGNOSIS — R600 Localized edema: Secondary | ICD-10-CM | POA: Diagnosis not present

## 2018-10-12 DIAGNOSIS — B351 Tinea unguium: Secondary | ICD-10-CM | POA: Diagnosis not present

## 2018-11-02 DIAGNOSIS — Z79899 Other long term (current) drug therapy: Secondary | ICD-10-CM | POA: Diagnosis not present

## 2018-11-08 DIAGNOSIS — I340 Nonrheumatic mitral (valve) insufficiency: Secondary | ICD-10-CM | POA: Diagnosis not present

## 2018-11-08 DIAGNOSIS — R296 Repeated falls: Secondary | ICD-10-CM | POA: Diagnosis not present

## 2018-11-08 DIAGNOSIS — R1319 Other dysphagia: Secondary | ICD-10-CM | POA: Diagnosis not present

## 2018-11-08 DIAGNOSIS — G301 Alzheimer's disease with late onset: Secondary | ICD-10-CM | POA: Diagnosis not present

## 2018-12-03 ENCOUNTER — Ambulatory Visit (INDEPENDENT_AMBULATORY_CARE_PROVIDER_SITE_OTHER): Payer: Medicare Other

## 2018-12-03 DIAGNOSIS — I442 Atrioventricular block, complete: Secondary | ICD-10-CM

## 2018-12-03 DIAGNOSIS — R001 Bradycardia, unspecified: Secondary | ICD-10-CM

## 2018-12-05 LAB — CUP PACEART REMOTE DEVICE CHECK
Battery Impedance: 876 Ohm
Battery Voltage: 2.78 V
Brady Statistic AP VS Percent: 4 %
Brady Statistic AS VP Percent: 1 %
Brady Statistic AS VS Percent: 74 %
Date Time Interrogation Session: 20200124195256
Implantable Lead Location: 753860
Implantable Lead Model: 5076
Implantable Lead Model: 5076
Lead Channel Impedance Value: 539 Ohm
Lead Channel Pacing Threshold Amplitude: 0.875 V
Lead Channel Pacing Threshold Pulse Width: 0.4 ms
Lead Channel Pacing Threshold Pulse Width: 0.4 ms
Lead Channel Setting Pacing Amplitude: 2.5 V
Lead Channel Setting Pacing Pulse Width: 0.4 ms
Lead Channel Setting Sensing Sensitivity: 2 mV
MDC IDC LEAD IMPLANT DT: 20041129
MDC IDC LEAD IMPLANT DT: 20041129
MDC IDC LEAD LOCATION: 753859
MDC IDC MSMT BATTERY REMAINING LONGEVITY: 55 mo
MDC IDC MSMT LEADCHNL RA IMPEDANCE VALUE: 446 Ohm
MDC IDC MSMT LEADCHNL RV PACING THRESHOLD AMPLITUDE: 1 V
MDC IDC PG IMPLANT DT: 20140623
MDC IDC SET LEADCHNL RA PACING AMPLITUDE: 2 V
MDC IDC STAT BRADY AP VP PERCENT: 20 %

## 2018-12-06 NOTE — Progress Notes (Signed)
Remote pacemaker transmission.   

## 2018-12-26 ENCOUNTER — Other Ambulatory Visit: Payer: Self-pay

## 2018-12-26 ENCOUNTER — Observation Stay (HOSPITAL_COMMUNITY)
Admission: EM | Admit: 2018-12-26 | Discharge: 2018-12-27 | Disposition: A | Discharge status: Home / Self Care | Payer: Medicare Other | Attending: Internal Medicine | Admitting: Internal Medicine

## 2018-12-26 ENCOUNTER — Encounter (HOSPITAL_COMMUNITY): Payer: Self-pay

## 2018-12-26 ENCOUNTER — Emergency Department (HOSPITAL_COMMUNITY)
Admission: EM | Admit: 2018-12-26 | Discharge: 2018-12-26 | Disposition: A | Discharge status: Home / Self Care | Payer: Medicare Other | Source: Home / Self Care | Attending: Emergency Medicine | Admitting: Emergency Medicine

## 2018-12-26 DIAGNOSIS — Z7901 Long term (current) use of anticoagulants: Secondary | ICD-10-CM

## 2018-12-26 DIAGNOSIS — H9190 Unspecified hearing loss, unspecified ear: Secondary | ICD-10-CM | POA: Diagnosis not present

## 2018-12-26 DIAGNOSIS — Z87891 Personal history of nicotine dependence: Secondary | ICD-10-CM | POA: Diagnosis not present

## 2018-12-26 DIAGNOSIS — I959 Hypotension, unspecified: Secondary | ICD-10-CM | POA: Diagnosis not present

## 2018-12-26 DIAGNOSIS — L89151 Pressure ulcer of sacral region, stage 1: Secondary | ICD-10-CM | POA: Insufficient documentation

## 2018-12-26 DIAGNOSIS — F03918 Unspecified dementia, unspecified severity, with other behavioral disturbance: Secondary | ICD-10-CM | POA: Diagnosis present

## 2018-12-26 DIAGNOSIS — R040 Epistaxis: Secondary | ICD-10-CM | POA: Insufficient documentation

## 2018-12-26 DIAGNOSIS — I2510 Atherosclerotic heart disease of native coronary artery without angina pectoris: Secondary | ICD-10-CM | POA: Diagnosis not present

## 2018-12-26 DIAGNOSIS — Z8249 Family history of ischemic heart disease and other diseases of the circulatory system: Secondary | ICD-10-CM | POA: Diagnosis not present

## 2018-12-26 DIAGNOSIS — Z950 Presence of cardiac pacemaker: Secondary | ICD-10-CM | POA: Diagnosis not present

## 2018-12-26 DIAGNOSIS — Z79890 Hormone replacement therapy (postmenopausal): Secondary | ICD-10-CM | POA: Insufficient documentation

## 2018-12-26 DIAGNOSIS — R9431 Abnormal electrocardiogram [ECG] [EKG]: Secondary | ICD-10-CM | POA: Insufficient documentation

## 2018-12-26 DIAGNOSIS — N182 Chronic kidney disease, stage 2 (mild): Secondary | ICD-10-CM | POA: Diagnosis not present

## 2018-12-26 DIAGNOSIS — K219 Gastro-esophageal reflux disease without esophagitis: Secondary | ICD-10-CM | POA: Diagnosis not present

## 2018-12-26 DIAGNOSIS — I4891 Unspecified atrial fibrillation: Secondary | ICD-10-CM | POA: Diagnosis present

## 2018-12-26 DIAGNOSIS — N189 Chronic kidney disease, unspecified: Secondary | ICD-10-CM | POA: Diagnosis present

## 2018-12-26 DIAGNOSIS — I110 Hypertensive heart disease with heart failure: Secondary | ICD-10-CM

## 2018-12-26 DIAGNOSIS — Z8546 Personal history of malignant neoplasm of prostate: Secondary | ICD-10-CM | POA: Insufficient documentation

## 2018-12-26 DIAGNOSIS — I480 Paroxysmal atrial fibrillation: Secondary | ICD-10-CM | POA: Insufficient documentation

## 2018-12-26 DIAGNOSIS — Z66 Do not resuscitate: Secondary | ICD-10-CM | POA: Diagnosis not present

## 2018-12-26 DIAGNOSIS — E785 Hyperlipidemia, unspecified: Secondary | ICD-10-CM | POA: Insufficient documentation

## 2018-12-26 DIAGNOSIS — I130 Hypertensive heart and chronic kidney disease with heart failure and stage 1 through stage 4 chronic kidney disease, or unspecified chronic kidney disease: Secondary | ICD-10-CM | POA: Insufficient documentation

## 2018-12-26 DIAGNOSIS — I5032 Chronic diastolic (congestive) heart failure: Secondary | ICD-10-CM | POA: Diagnosis not present

## 2018-12-26 DIAGNOSIS — D649 Anemia, unspecified: Secondary | ICD-10-CM | POA: Diagnosis not present

## 2018-12-26 DIAGNOSIS — I5042 Chronic combined systolic (congestive) and diastolic (congestive) heart failure: Secondary | ICD-10-CM | POA: Insufficient documentation

## 2018-12-26 DIAGNOSIS — D72829 Elevated white blood cell count, unspecified: Secondary | ICD-10-CM | POA: Diagnosis present

## 2018-12-26 DIAGNOSIS — Z79899 Other long term (current) drug therapy: Secondary | ICD-10-CM

## 2018-12-26 DIAGNOSIS — Z951 Presence of aortocoronary bypass graft: Secondary | ICD-10-CM | POA: Insufficient documentation

## 2018-12-26 DIAGNOSIS — L8990 Pressure ulcer of unspecified site, unspecified stage: Secondary | ICD-10-CM | POA: Diagnosis present

## 2018-12-26 DIAGNOSIS — F0391 Unspecified dementia with behavioral disturbance: Secondary | ICD-10-CM | POA: Insufficient documentation

## 2018-12-26 LAB — TYPE AND SCREEN
ABO/RH(D): A POS
Antibody Screen: NEGATIVE

## 2018-12-26 LAB — POCT I-STAT EG7
Bicarbonate: 21.6 mmol/L (ref 20.0–28.0)
Calcium, Ion: 1.06 mmol/L — ABNORMAL LOW (ref 1.15–1.40)
HCT: 31 % — ABNORMAL LOW (ref 39.0–52.0)
Hemoglobin: 10.5 g/dL — ABNORMAL LOW (ref 13.0–17.0)
O2 SAT: 99 %
Potassium: 4.3 mmol/L (ref 3.5–5.1)
SODIUM: 140 mmol/L (ref 135–145)
TCO2: 22 mmol/L (ref 22–32)
pCO2, Ven: 26.8 mmHg — ABNORMAL LOW (ref 44.0–60.0)
pH, Ven: 7.515 — ABNORMAL HIGH (ref 7.250–7.430)
pO2, Ven: 101 mmHg — ABNORMAL HIGH (ref 32.0–45.0)

## 2018-12-26 LAB — COMPREHENSIVE METABOLIC PANEL
ALBUMIN: 3 g/dL — AB (ref 3.5–5.0)
ALT: 26 U/L (ref 0–44)
AST: 40 U/L (ref 15–41)
Alkaline Phosphatase: 136 U/L — ABNORMAL HIGH (ref 38–126)
Anion gap: 7 (ref 5–15)
BILIRUBIN TOTAL: 1.3 mg/dL — AB (ref 0.3–1.2)
BUN: 44 mg/dL — ABNORMAL HIGH (ref 8–23)
CO2: 24 mmol/L (ref 22–32)
Calcium: 8.9 mg/dL (ref 8.9–10.3)
Chloride: 109 mmol/L (ref 98–111)
Creatinine, Ser: 0.98 mg/dL (ref 0.61–1.24)
GFR calc Af Amer: >60 mL/min (ref >60)
GFR calc non Af Amer: >60 mL/min (ref >60)
GLUCOSE: 119 mg/dL — AB (ref 70–99)
Potassium: 4.4 mmol/L (ref 3.5–5.1)
Sodium: 140 mmol/L (ref 135–145)
Total Protein: 5.3 g/dL — ABNORMAL LOW (ref 6.5–8.1)

## 2018-12-26 LAB — CBC
HCT: 33.8 % — ABNORMAL LOW (ref 39.0–52.0)
Hemoglobin: 10.4 g/dL — ABNORMAL LOW (ref 13.0–17.0)
MCH: 29.2 pg (ref 26.0–34.0)
MCHC: 30.8 g/dL (ref 30.0–36.0)
MCV: 94.9 fL (ref 80.0–100.0)
Platelets: 205 10*3/uL (ref 150–400)
RBC: 3.56 MIL/uL — ABNORMAL LOW (ref 4.22–5.81)
RDW: 14.8 % (ref 11.5–15.5)
WBC: 12.3 10*3/uL — ABNORMAL HIGH (ref 4.0–10.5)
nRBC: 0 % (ref 0.0–0.2)

## 2018-12-26 LAB — HEMOGLOBIN: Hemoglobin: 9.8 g/dL — ABNORMAL LOW (ref 13.0–17.0)

## 2018-12-26 LAB — ABO/RH: ABO/RH(D): A POS

## 2018-12-26 LAB — PROTIME-INR
INR: 1.42
Prothrombin Time: 17.2 seconds — ABNORMAL HIGH (ref 11.4–15.2)

## 2018-12-26 LAB — HEMATOCRIT: HEMATOCRIT: 31.5 % — AB (ref 39.0–52.0)

## 2018-12-26 MED ORDER — VITAMIN C 500 MG PO TABS
500.0000 mg | ORAL_TABLET | Freq: Every day | ORAL | Status: DC
  Administered 2018-12-27: 500 mg via ORAL
  Filled 2018-12-26: qty 1

## 2018-12-26 MED ORDER — SODIUM CHLORIDE 0.9 % IV BOLUS
1000.0000 mL | Freq: Once | INTRAVENOUS | Status: AC
  Administered 2018-12-26: 1000 mL via INTRAVENOUS

## 2018-12-26 MED ORDER — PRAVASTATIN SODIUM 10 MG PO TABS
10.0000 mg | ORAL_TABLET | Freq: Every day | ORAL | Status: DC
  Administered 2018-12-27: 10 mg via ORAL
  Filled 2018-12-26: qty 1

## 2018-12-26 MED ORDER — OXYMETAZOLINE HCL 0.05 % NA SOLN: 2.0000 | NASAL | 0 refills | Status: DC | PRN

## 2018-12-26 MED ORDER — ZINC OXIDE 40 % EX OINT
TOPICAL_OINTMENT | Freq: Three times a day (TID) | CUTANEOUS | Status: DC
  Administered 2018-12-26 – 2018-12-27 (×2): via TOPICAL
  Filled 2018-12-26: qty 57

## 2018-12-26 MED ORDER — CEPHALEXIN 500 MG PO CAPS
500.0000 mg | ORAL_CAPSULE | Freq: Once | ORAL | Status: AC
  Administered 2018-12-26: 500 mg via ORAL
  Filled 2018-12-26: qty 1

## 2018-12-26 MED ORDER — LEVOTHYROXINE SODIUM 25 MCG PO TABS
25.0000 ug | ORAL_TABLET | Freq: Every day | ORAL | Status: DC
  Administered 2018-12-27: 25 ug via ORAL
  Filled 2018-12-26: qty 1

## 2018-12-26 MED ORDER — NITROGLYCERIN 0.4 MG SL SUBL: 0.4000 mg | SUBLINGUAL_TABLET | SUBLINGUAL | Status: DC | PRN

## 2018-12-26 MED ORDER — ACETAMINOPHEN 500 MG PO TABS
1000.0000 mg | ORAL_TABLET | Freq: Once | ORAL | Status: DC
  Filled 2018-12-26: qty 2

## 2018-12-26 MED ORDER — LIDOCAINE HCL URETHRAL/MUCOSAL 2 % EX GEL
1.0000 "application " | Freq: Once | CUTANEOUS | Status: AC
  Administered 2018-12-26: 1 via TOPICAL
  Filled 2018-12-26: qty 20

## 2018-12-26 MED ORDER — OXYMETAZOLINE HCL 0.05 % NA SOLN
2.0000 | NASAL | Status: DC | PRN
  Filled 2018-12-26: qty 30

## 2018-12-26 MED ORDER — ACETAMINOPHEN 325 MG PO TABS
650.0000 mg | ORAL_TABLET | Freq: Four times a day (QID) | ORAL | Status: DC | PRN
  Administered 2018-12-26: 650 mg via ORAL
  Filled 2018-12-26: qty 2

## 2018-12-26 MED ORDER — ACETAMINOPHEN 650 MG RE SUPP: 650.0000 mg | Freq: Four times a day (QID) | RECTAL | Status: DC | PRN

## 2018-12-26 MED ORDER — OXYMETAZOLINE HCL 0.05 % NA SOLN
1.0000 | Freq: Once | NASAL | Status: AC
  Administered 2018-12-26: 1 via NASAL
  Filled 2018-12-26: qty 30

## 2018-12-26 MED ORDER — ZINC OXIDE 40 % EX PSTE: 1.0000 "application " | PASTE | Freq: Three times a day (TID) | CUTANEOUS | Status: DC

## 2018-12-26 MED ORDER — OXYCODONE HCL 5 MG PO TABS: 5.0000 mg | ORAL_TABLET | ORAL | Status: DC | PRN

## 2018-12-26 MED ORDER — MIRTAZAPINE 15 MG PO TABS
15.0000 mg | ORAL_TABLET | Freq: Every day | ORAL | Status: DC
  Filled 2018-12-26: qty 1

## 2018-12-26 NOTE — ED Notes (Signed)
ENT cart bedside

## 2018-12-26 NOTE — ED Provider Notes (Addendum)
[facility] EMERGENCY DEPARTMENT Provider Note   CSN: [id number] Arrival date & time: 12/26/18  0720     History   Chief Complaint Chief Complaint  Patient presents with  . Epistaxis    HPI [name] is a 85 y.o. male with PMH significant for Afib on eliquis, CAD, CHF, HTN, HLD.  He presents today via EMS from [place] for nosebleed. He was found this morning with a profuse nosebleed and was still bleeding upon EMS arrival. EMs found his BP to be 93/61 at the time and noticed about 200cc of blood. He was mentating well. He was given 1 spray afrin to each nare with resolution of the bleeding. Patient states he feels well without CP, LOC, lightheadedness or dizziness. No trauma to area or nosepicking. He feels hungry.  Daughter at bedside states that he does not get nosebleeds, has been in good health.       Past Medical History:  Diagnosis Date  . Adenocarcinoma of prostate ([place]) 10/2005   lupron injections q46mo  . Arthritis    ?rheumatoid, never dx or on DMARDs  . Atrial fibrillation ([facility])    chronic anticoag  . Barrett's esophagus   . CAD (coronary artery disease)    CABG 08/1973  . CHF (congestive heart failure) ([place])    ICM  . Chronic renal insufficiency   . Diverticulosis of colon   . GERD (gastroesophageal reflux disease)   . Hyperlipidemia   . Hypertension   . Memory loss   . Osteoporosis   . Pacemaker 09/2003   RESULTS:  This demonstrates successful implantation of a Medtronic dual  . Paroxysmal atrial fibrillation ([place])   . Unsteady gait    due to B ankle DJD and instability    Patient Active Problem List   Diagnosis Date Noted  . Dysphagia 03/08/2015  . Pacemaker 01/30/2015  . Aortic stenosis 01/30/2015  . CVA (cerebral infarction) 11/04/2014  . Mitral valve disorder 01/07/2011  . OSTEOPOROSIS 08/13/2010  . Chronic kidney disease 04/04/2009  . UNSTEADY GAIT 12/19/2008  . ATRIAL FIBRILLATION, PAROXYSMAL  09/29/2008  . BARRETTS ESOPHAGUS 12/02/2007  . ADENOCARCINOMA, PROSTATE, HX OF 11/16/2007  . Essential hypertension 10/11/2007  . Elevated lipids 05/20/2007  . Coronary atherosclerosis 05/20/2007  . Chronic combined systolic and diastolic congestive heart failure ([place]) 05/20/2007    Past Surgical History:  Procedure Laterality Date  . CORONARY ARTERY BYPASS GRAFT  08/1973   3 heart heart arteries were 80 percent clogged. the lower valve of my heart is not functioning  . DOPPLER ECHOCARDIOGRAPHY  2004, 2008  . HERNIA REPAIR     age 76 ( not sure if it was right or left side)  . PACEMAKER GENERATOR CHANGE N/A 05/02/2013   Procedure: PACEMAKER GENERATOR CHANGE;  Surgeon: Evans Lance, MD;  Location: [facility] CATH LAB;  Service: Cardiovascular;  Laterality: N/A;  . PACEMAKER PLACEMENT  09/2003        Home Medications    Prior to Admission medications   Medication Sig Start Date End Date Taking? Authorizing Provider  acetaminophen (TYLENOL) 325 MG tablet Take 650 mg by mouth every 6 (six) hours as needed (for pain).     [provider]  apixaban (ELIQUIS) 5 MG TABS tablet Take 1 tablet (5 mg total) by mouth 2 (two) times daily. 08/20/16   Evans Lance, MD  furosemide (LASIX) 20 MG tablet Take 20 mg by mouth daily with breakfast.     [provider]  Levothyroxine Sodium (SYNTHROID PO) Take 1 tablet by mouth daily before breakfast.    [provider]  LUTEIN-ZEAXANTHIN PO Take 1 tablet by mouth daily with breakfast.    [provider]  mirtazapine (REMERON) 15 MG tablet Take 1 tablet (15 mg total) by mouth at bedtime. 01/08/17   Hoyt Koch, MD  nitroGLYCERIN (NITROSTAT) 0.4 MG SL tablet Place 1 tablet (0.4 mg total) under the tongue every 5 (five) minutes as needed. 02/21/15   Josue Hector, MD  nystatin cream (MYCOSTATIN) Apply to affected area 2 times daily 02/19/17   Tegeler, Gwenyth Allegra, MD  potassium chloride (K-DUR) 10 MEQ tablet TAKE 1  TABLET (10 MEQ TOTAL) BY MOUTH DAILY. 09/12/15   Hoyt Koch, MD  pravastatin (PRAVACHOL) 10 MG tablet Take 10 mg by mouth daily.    [provider]  ranitidine (ZANTAC) 150 MG tablet TAKE 1 TABLET (150 MG TOTAL) BY MOUTH AT BEDTIME. 12/24/15   Hoyt Koch, MD  vitamin C (ASCORBIC ACID) 500 MG tablet Take 500 mg by mouth every morning.    [provider]    Family History Family History  Problem Relation Age of Onset  . Cancer Mother   . Heart attack Father     Social History Social History   Tobacco Use  . Smoking status: Former Smoker    Last attempt to quit: 08/05/1986    Years since quitting: 32.4  . Smokeless tobacco: Never Used  Substance Use Topics  . Alcohol use: Yes    Comment: samll glass of wine  . Drug use: Not on file     Allergies   Patient has no known allergies.   Review of Systems Review of Systems  Constitutional: Negative for chills and fever.  HENT: Positive for nosebleeds. Negative for rhinorrhea and sinus pain.   Respiratory: Negative for shortness of breath.   Cardiovascular: Negative for chest pain.  Gastrointestinal: Negative for abdominal pain, nausea and vomiting.  Neurological: Negative for dizziness and light-headedness.     Physical Exam Updated Vital Signs BP 106/66 (BP Location: Left Arm)   Pulse 72   Temp 97.6 F (36.4 C) (Oral)   Resp 16   SpO2 99%   Physical Exam Constitutional:      General: He is not in acute distress.    Appearance: Normal appearance. He is not ill-appearing, toxic-appearing or diaphoretic.     Comments: Hard of hearing  HENT:     Head: Normocephalic and atraumatic.     Nose:     Comments: Anterior turbinates without lesions or bleeding bilaterally. Dry crusted blood around R nare and on R side of face.     Mouth/Throat:     Mouth: Mucous membranes are moist.     Pharynx: Oropharynx is clear. No oropharyngeal exudate or posterior oropharyngeal erythema.      Comments: No lesions or lacerations. Tongue normal.  Eyes:     Extraocular Movements: Extraocular movements intact.     Conjunctiva/sclera: Conjunctivae normal.     Pupils: Pupils are equal, round, and reactive to light.  Neck:     Musculoskeletal: Normal range of motion.  Cardiovascular:     Rate and Rhythm: Normal rate and regular rhythm.     Heart sounds: Murmur (systolic grade III) present. No friction rub. No gallop.   Pulmonary:     Effort: Pulmonary effort is normal.     Breath sounds: Normal breath sounds. No wheezing or rhonchi.  Abdominal:     General: Abdomen is flat. Bowel sounds are normal. There is no distension.     Tenderness: There is no abdominal tenderness. There is no guarding.  Neurological:     General: No focal deficit present.     Mental Status: He is alert.     Comments: Oriented to self and place but not time  Psychiatric:        Mood and Affect: Mood normal.        Behavior: Behavior normal.      ED Treatments / Results  Labs (all labs ordered are listed, but only abnormal results are displayed) Labs Reviewed  CBC WITH DIFFERENTIAL/PLATELET  BASIC METABOLIC PANEL  PROTIME-INR    EKG None  Radiology No results found.  Procedures Procedures (including critical care time)  Medications Ordered in ED Medications - No data to display   Initial Impression / Assessment and Plan / ED Course  I have reviewed the triage vital signs and the nursing notes.  Pertinent labs & imaging results that were available during my care of the patient were reviewed by me and considered in my medical decision making (see chart for details).    Patient is a 85yo M on eliquis who presented after a nosebleed which resolved after afrin en route. He has no lesions or lacerations to suggest other source of bleeding. He is spitting up some small slots at bedside which is likely residual postnasal drainage. He is mentating well with stable vitals. Per chart review  baseline BPs are ~100/60s. He is asymptomatic and feels well. Stable for discharge home and given rx for afrin as well as instructed to hold pressure for any additional bleeding.  Final Clinical Impressions(s) / ED Diagnoses   Final diagnoses:  Epistaxis    ED Discharge Orders         Ordered    oxymetazoline (AFRIN) 0.05 % nasal spray  As needed     12/26/18 [place], [place], DO 12/26/18 [id number]    Gareth Morgan, MD 12/30/18 1058

## 2018-12-26 NOTE — ED Notes (Signed)
Pts daughter at bedside  

## 2018-12-26 NOTE — ED Notes (Signed)
ED Provider at bedside. 

## 2018-12-26 NOTE — ED Triage Notes (Signed)
To triage via EMS.  Pt seen this morning for nosebleed.  Pt discharged returned to facility staff report nose has been intermittantly bleeding.   EMS VS BP 105/60 hr 70 SpO2 96 CBG 163

## 2018-12-26 NOTE — ED Notes (Addendum)
Discharge instructions given to patient and family. Given the opportunity to ask questions. Pt verbalized understanding.

## 2018-12-26 NOTE — ED Notes (Signed)
Pt became pale, diaphoretic, lethargic, had a far away gaze== pt placed in supine, IV started with 20G in left forearm, NS started. Dr. Billy Fischer at bedside.

## 2018-12-26 NOTE — ED Notes (Signed)
CALLED [facility] FOR PT TRANSPORT  

## 2018-12-26 NOTE — Progress Notes (Signed)
Pt oriented to the room, call light and room phone in reach. Pt bed alarm on due to falls in the last 6 months. Completed admission database with daughter at bedside. Daughter stated to call when pt is discharged tomorrow and she will pick up pt and transfer to ALF. Assisted pt in ordering dinner.   Daughter Magda Paganini [phone]

## 2018-12-26 NOTE — Discharge Instructions (Addendum)
If you have more nosebleeding, please spray 2-5 times into the nostril and place nose clip to put pressure for 15-41min.

## 2018-12-26 NOTE — ED Provider Notes (Signed)
[facility] EMERGENCY DEPARTMENT Provider Note   CSN: [id number] Arrival date & time: 12/26/18  1204     History   Chief Complaint Chief Complaint  Patient presents with  . Epistaxis    HPI [name] is a 85 y.o. male.  Patient was here earlier this morning with nosebleed that resolved with afrin. Daughter states that patient got back to ALF, ate breakfast and was doing well but then got up and when he leaned forward started having nosebleed out of R nostril. She states it was not a large amount but a steady drip that would occasionally stop. She states that the afrin and noseclip given to them at discharge was lost and were not administered to him. Otherwise no new symptoms.        Past Medical History:  Diagnosis Date  . Adenocarcinoma of prostate ([place]) 10/2005   lupron injections q72mo  . Arthritis    ?rheumatoid, never dx or on DMARDs  . Atrial fibrillation (HCC)    chronic anticoag  . Barrett's esophagus   . CAD (coronary artery disease)    CABG 08/1973  . CHF (congestive heart failure) ([place])    ICM  . Chronic renal insufficiency   . Diverticulosis of colon   . GERD (gastroesophageal reflux disease)   . Hyperlipidemia   . Hypertension   . Memory loss   . Osteoporosis   . Pacemaker 09/2003   RESULTS:  This demonstrates successful implantation of a Medtronic dual  . Paroxysmal atrial fibrillation ([place])   . Unsteady gait    due to B ankle DJD and instability    Patient Active Problem List   Diagnosis Date Noted  . Dysphagia 03/08/2015  . Pacemaker 01/30/2015  . Aortic stenosis 01/30/2015  . CVA (cerebral infarction) 11/04/2014  . Mitral valve disorder 01/07/2011  . OSTEOPOROSIS 08/13/2010  . Chronic kidney disease 04/04/2009  . UNSTEADY GAIT 12/19/2008  . ATRIAL FIBRILLATION, PAROXYSMAL 09/29/2008  . BARRETTS ESOPHAGUS 12/02/2007  . ADENOCARCINOMA, PROSTATE, HX OF 11/16/2007  . Essential hypertension 10/11/2007  . Elevated  lipids 05/20/2007  . Coronary atherosclerosis 05/20/2007  . Chronic combined systolic and diastolic congestive heart failure ([place]) 05/20/2007    Past Surgical History:  Procedure Laterality Date  . CORONARY ARTERY BYPASS GRAFT  08/1973   3 heart heart arteries were 80 percent clogged. the lower valve of my heart is not functioning  . DOPPLER ECHOCARDIOGRAPHY  2004, 2008  . HERNIA REPAIR     age 6 ( not sure if it was right or left side)  . PACEMAKER GENERATOR CHANGE N/A 05/02/2013   Procedure: PACEMAKER GENERATOR CHANGE;  Surgeon: Evans Lance, MD;  Location: [facility] CATH LAB;  Service: Cardiovascular;  Laterality: N/A;  . PACEMAKER PLACEMENT  09/2003        Home Medications    Prior to Admission medications   Medication Sig Start Date End Date Taking? Authorizing Provider  acetaminophen (TYLENOL) 325 MG tablet Take 650 mg by mouth every 6 (six) hours as needed (for pain).     [provider]  apixaban (ELIQUIS) 5 MG TABS tablet Take 1 tablet (5 mg total) by mouth 2 (two) times daily. 08/20/16   Evans Lance, MD  furosemide (LASIX) 20 MG tablet Take 20 mg by mouth daily with breakfast.     [provider]  Levothyroxine Sodium (SYNTHROID PO) Take 1 tablet by mouth daily before breakfast.    [provider]  LUTEIN-ZEAXANTHIN PO Take  1 tablet by mouth daily with breakfast.    [provider]  mirtazapine (REMERON) 15 MG tablet Take 1 tablet (15 mg total) by mouth at bedtime. 01/08/17   Hoyt Koch, MD  nitroGLYCERIN (NITROSTAT) 0.4 MG SL tablet Place 1 tablet (0.4 mg total) under the tongue every 5 (five) minutes as needed. 02/21/15   Josue Hector, MD  nystatin cream (MYCOSTATIN) Apply to affected area 2 times daily 02/19/17   Tegeler, Gwenyth Allegra, MD  oxymetazoline (AFRIN) 0.05 % nasal spray Place 2-5 sprays into both nostrils as needed for congestion. 12/26/18 12/26/19  Bufford Lope, DO  potassium chloride (K-DUR) 10 MEQ tablet TAKE 1  TABLET (10 MEQ TOTAL) BY MOUTH DAILY. 09/12/15   Hoyt Koch, MD  pravastatin (PRAVACHOL) 10 MG tablet Take 10 mg by mouth daily.    [provider]  ranitidine (ZANTAC) 150 MG tablet TAKE 1 TABLET (150 MG TOTAL) BY MOUTH AT BEDTIME. 12/24/15   Hoyt Koch, MD  vitamin C (ASCORBIC ACID) 500 MG tablet Take 500 mg by mouth every morning.    [provider]    Family History Family History  Problem Relation Age of Onset  . Cancer Mother   . Heart attack Father     Social History Social History   Tobacco Use  . Smoking status: Former Smoker    Last attempt to quit: 08/05/1986    Years since quitting: 32.4  . Smokeless tobacco: Never Used  Substance Use Topics  . Alcohol use: Yes    Comment: samll glass of wine  . Drug use: Not on file     Allergies   Patient has no known allergies.   Review of Systems Review of Systems  HENT: Positive for nosebleeds.   Cardiovascular: Negative for chest pain.  Gastrointestinal: Negative for abdominal pain and nausea.  Neurological: Negative for dizziness and light-headedness.     Physical Exam Updated Vital Signs BP (!) 90/51 (BP Location: Left Arm)   Pulse 93   Temp (!) 97.4 F (36.3 C) (Oral)   Resp 20   SpO2 100%   Physical Exam Constitutional:      Appearance: Normal appearance.  HENT:     Head: Normocephalic and atraumatic.     Nose: No nasal deformity, septal deviation or mucosal edema.     Right Nostril: Epistaxis present. No foreign body or occlusion.     Left Nostril: No foreign body, epistaxis or occlusion.     Mouth/Throat:     Mouth: Mucous membranes are moist. No injury, lacerations or oral lesions.     Pharynx: Uvula midline. No posterior oropharyngeal erythema.     Tonsils: No tonsillar exudate.     Comments: Oropharynx with postnasal bloody drainage Cardiovascular:     Rate and Rhythm: Normal rate and regular rhythm.     Heart sounds: No murmur.  Pulmonary:     Effort:  Pulmonary effort is normal.     Breath sounds: Normal breath sounds.  Abdominal:     General: Abdomen is flat. Bowel sounds are normal.  Neurological:     Mental Status: He is alert.      ED Treatments / Results  Labs (all labs ordered are listed, but only abnormal results are displayed) Labs Reviewed  CBC - Abnormal; Notable for the following components:      Result Value   WBC 12.3 (*)    RBC 3.56 (*)    Hemoglobin 10.4 (*)  HCT 33.8 (*)    All other components within normal limits  POCT I-STAT EG7 - Abnormal; Notable for the following components:   pH, Ven 7.515 (*)    pCO2, Ven 26.8 (*)    pO2, Ven 101.0 (*)    Calcium, Ion 1.06 (*)    HCT 31.0 (*)    Hemoglobin 10.5 (*)    All other components within normal limits  PROTIME-INR  COMPREHENSIVE METABOLIC PANEL  TYPE AND SCREEN    EKG None  Radiology No results found.  Procedures .Epistaxis Management Date/Time: 12/26/2018 2:00 PM Performed by: Bufford Lope, DO Authorized by: Gareth Morgan, MD   Consent:    Consent obtained:  Verbal   Consent given by:  Patient   Risks discussed:  Infection, nasal injury, pain and bleeding   Alternatives discussed:  No treatment Anesthesia (see MAR for exact dosages):    Anesthesia method:  Topical application   Topical anesthetic:  Lidocaine gel Procedure details:    Treatment site:  R anterior and R posterior   Treatment method:  Nasal balloon (rhino-rocket)   Treatment complexity:  Limited   Treatment episode: recurring   Post-procedure details:    Assessment:  Bleeding stopped   Patient tolerance of procedure:  Tolerated well, no immediate complications   (including critical care time)  Medications Ordered in ED Medications  lidocaine (XYLOCAINE) 2 % jelly 1 application (has no administration in time range)  oxymetazoline (AFRIN) 0.05 % nasal spray 1 spray (has no administration in time range)  sodium chloride 0.9 % bolus 1,000 mL (1,000 mLs Intravenous  New Bag/Given 12/26/18 1423)  acetaminophen (TYLENOL) tablet 1,000 mg (has no administration in time range)     Initial Impression / Assessment and Plan / ED Course  I have reviewed the triage vital signs and the nursing notes.  Pertinent labs & imaging results that were available during my care of the patient were reviewed by me and considered in my medical decision making (see chart for details).    Patient is a 85 yo M on eliquis who was here earlier today with R sided epistaxis that initially resolved with Afrin. He returns with resumption of R sided epistaxis despite holding pressure.  A nose clip was placed for 33min without success.   1:47pm After discussion with patient and family, decided to proceed with nasal packing with rhino-rocket. Patient tolerated overall well but had some pain during insertion.  2:20pm Patient became pale and diaphoretic. His BP dropped to 83/51 from his baseline of 100/60s. Given IVF and labs checked. Reassuring Hgb 10.6 but is down from last known BL of ~13 from 2018 per chart review. Likely vasovagal reaction. However given his age, possible Hgb drop as well as episode of hypotension along with family discomfort, plan to admit for overnight observation and recheck of Hgb.  Final Clinical Impressions(s) / ED Diagnoses   Final diagnoses:  Right-sided epistaxis    ED Discharge Orders    None       Bufford Lope, DO 12/26/18 1528    Gareth Morgan, MD 12/30/18 1055

## 2018-12-26 NOTE — ED Triage Notes (Signed)
Pt BIB [facility] from [facility]. Per EMS pt woke up this morning with a nosebleed. Pt is on eliquis and EMS reports an estimated 264mL of blood loss. EMS gave 1 spray of afrin to each side of patient's nose. Pt is very hard of hearing. Bleeding is controlled at this time. Pt denies any loss of consciousness or dizziness.   EMS vital signs 93/61 BP, 70HR. 96% on room air, and RR of 16.

## 2018-12-26 NOTE — H&P (Addendum)
History and Physical    DOA: 12/26/2018  PCP: Hoyt Koch, MD  Patient coming from: Assisted living facility  Chief Complaint: Epistaxis  HPI: [name] is a 85 y.o. male with history h/o hypertension, hyperlipidemia prostate cancer, CAD status post CABG, CHF, chronic renal insufficiency, atrial fibrillation on Eliquis presented early this morning from the assisted living facility with epistaxis.  Patient apparently woke up to a pool of blood on his sheets and staff figured that he was having nosebleed.  He was brought into the ED, nasal bleeding resolved with Afrin spray and pinching.  He was discharged back to the facility around 9 AM.  Within few minutes of arrival there patient received all his morning medications including Eliquis.  Epistaxis recurred and did not respond to conservative measures including Afrin spray and patient was transferred back here.  Bleeding was controlled with Rhino pack inserted by ED physician.  Patient had an episode of hypotension with systolic in the 91Y and near syncopal event which could have been a vagal episode related to Rhino packing.  Blood pressure currently in systolic 782N.  Given advanced age and recurrent epistaxis with hypotension, patient requested to be admitted for observation to medical service.  Currently resting comfortably.  Daughter at bedside and provided most of the history as patient is hard of hearing.   Review of Systems: As per HPI otherwise 10 point review of systems negative.    Past Medical History:  Diagnosis Date  . Adenocarcinoma of prostate ([place]) 10/2005   lupron injections q38mo  . Arthritis    ?rheumatoid, never dx or on DMARDs  . Atrial fibrillation (HCC)    chronic anticoag  . Barrett's esophagus   . CAD (coronary artery disease)    CABG 08/1973  . CHF (congestive heart failure) ([place])    ICM  . Chronic renal insufficiency   . Diverticulosis of colon   . GERD (gastroesophageal reflux disease)   .  Hyperlipidemia   . Hypertension   . Memory loss   . Osteoporosis   . Pacemaker 09/2003   RESULTS:  This demonstrates successful implantation of a Medtronic dual  . Paroxysmal atrial fibrillation ([place])   . Unsteady gait    due to B ankle DJD and instability    Past Surgical History:  Procedure Laterality Date  . CORONARY ARTERY BYPASS GRAFT  08/1973   3 heart heart arteries were 80 percent clogged. the lower valve of my heart is not functioning  . DOPPLER ECHOCARDIOGRAPHY  2004, 2008  . HERNIA REPAIR     age 76 ( not sure if it was right or left side)  . PACEMAKER GENERATOR CHANGE N/A 05/02/2013   Procedure: PACEMAKER GENERATOR CHANGE;  Surgeon: Evans Lance, MD;  Location: [facility] CATH LAB;  Service: Cardiovascular;  Laterality: N/A;  . PACEMAKER PLACEMENT  09/2003    Social history:  reports that he quit smoking about 32 years ago. He has never used smokeless tobacco. He reports current alcohol use. No history on file for drug.   No Known Allergies  Family History  Problem Relation Age of Onset  . Cancer Mother   . Heart attack Father       Prior to Admission medications   Medication Sig Start Date End Date Taking? Authorizing Provider  acetaminophen (TYLENOL) 650 MG CR tablet Take 650 mg by mouth See admin instructions. Take one tablet (650 mg) by mouth twice daily - 8am and 4pm   Yes [provider]  apixaban (ELIQUIS) 2.5 MG TABS tablet Take 2.5 mg by mouth 2 (two) times daily.   Yes [provider]  furosemide (LASIX) 20 MG tablet Take 20 mg by mouth daily.    Yes [provider]  levothyroxine (SYNTHROID) 25 MCG tablet Take 25 mcg by mouth daily at 6 (six) AM.    Yes [provider]  LUTEIN-ZEAXANTHIN PO Take 1 tablet by mouth daily.    Yes [provider]  nitroGLYCERIN (NITROSTAT) 0.4 MG SL tablet Place 1 tablet (0.4 mg total) under the tongue every 5 (five) minutes as needed. Patient taking differently: Place 0.4 mg under  the tongue every 5 (five) minutes as needed for chest pain.  02/21/15  Yes Josue Hector, MD  potassium chloride (K-DUR) 10 MEQ tablet TAKE 1 TABLET (10 MEQ TOTAL) BY MOUTH DAILY. Patient taking differently: Take 10 mEq by mouth daily.  09/12/15  Yes Hoyt Koch, MD  ranitidine (ZANTAC) 150 MG tablet TAKE 1 TABLET (150 MG TOTAL) BY MOUTH AT BEDTIME. Patient taking differently: Take 150 mg by mouth at bedtime.  12/24/15  Yes Hoyt Koch, MD  vitamin C (ASCORBIC ACID) 500 MG tablet Take 500 mg by mouth daily.    Yes [provider]  Zinc Oxide (DESITIN) 40 % PSTE Apply 1 application topically 3 (three) times daily.   Yes [provider]  apixaban (ELIQUIS) 5 MG TABS tablet Take 1 tablet (5 mg total) by mouth 2 (two) times daily. Patient not taking: Reported on 12/26/2018 08/20/16   Evans Lance, MD  mirtazapine (REMERON) 15 MG tablet Take 1 tablet (15 mg total) by mouth at bedtime. Patient not taking: Reported on 12/26/2018 01/08/17   Hoyt Koch, MD  nystatin cream (MYCOSTATIN) Apply to affected area 2 times daily Patient not taking: Reported on 12/26/2018 02/19/17   Tegeler, Gwenyth Allegra, MD  oxymetazoline (AFRIN) 0.05 % nasal spray Place 2-5 sprays into both nostrils as needed for congestion. Patient taking differently: Place 2-5 sprays into both nostrils 2 (two) times daily as needed for congestion.  12/26/18 12/26/19  Bufford Lope, DO    Physical Exam:  Vitals:   12/26/18 1600 12/26/18 1630 12/26/18 1723 12/26/18 1803  BP: 102/61 120/67 (!) 112/55   Pulse: 63 68 71   Resp: (!) 24 (!) 22 18   Temp:   97.8 F (36.6 C)   TempSrc:   Oral   SpO2: 99% 95% 100%   Weight:    68.5 kg  Height:    5\' 4"  (1.626 m)    Constitutional: NAD, calm, comfortable Eyes: PERRL, lids and conjunctivae normal ENMT: Mucous membranes are moist. Posterior pharynx clear of any exudate or lesions.Normal dentition.  Neck: normal, supple, no masses, no  thyromegaly Respiratory: clear to auscultation bilaterally, no wheezing, no crackles. Normal respiratory effort. No accessory muscle use.  Cardiovascular: Regular rate and rhythm, no murmurs / rubs / gallops. No extremity edema. 2+ pedal pulses. No carotid bruits.  Abdomen: no tenderness, no masses palpated. No hepatosplenomegaly. Bowel sounds positive.  Musculoskeletal: no clubbing / cyanosis. No joint deformity upper and lower extremities. Good ROM, no contractures. Normal muscle tone.  Neurologic: CN 2-12 grossly intact. Sensation intact, DTR normal. Strength 5/5 in all 4.  Psychiatric: Normal judgment and insight. Alert and oriented x 3. Normal mood.  SKIN/catheters: no rashes, lesions, ulcers. No induration  Labs on Admission: I have personally reviewed following labs and imaging studies  CBC: Recent Labs  Lab 12/26/18  1420 12/26/18 1433  WBC 12.3*  --   HGB 10.4* 10.5*  HCT 33.8* 31.0*  MCV 94.9  --   PLT 205  --    Basic Metabolic Panel: Recent Labs  Lab 12/26/18 1420 12/26/18 1433  NA 140 140  K 4.4 4.3  CL 109  --   CO2 24  --   GLUCOSE 119*  --   BUN 44*  --   CREATININE 0.98  --   CALCIUM 8.9  --    GFR: Estimated Creatinine Clearance: 34.4 mL/min (by C-G formula based on SCr of 0.98 mg/dL). Liver Function Tests: Recent Labs  Lab 12/26/18 1420  AST 40  ALT 26  ALKPHOS 136*  BILITOT 1.3*  PROT 5.3*  ALBUMIN 3.0*   No results for input(s): LIPASE, AMYLASE in the last 168 hours. No results for input(s): AMMONIA in the last 168 hours. Coagulation Profile: Recent Labs  Lab 12/26/18 1420  INR 1.42   Cardiac Enzymes: No results for input(s): CKTOTAL, CKMB, CKMBINDEX, TROPONINI in the last 168 hours. BNP (last 3 results) No results for input(s): PROBNP in the last 8760 hours. HbA1C: No results for input(s): HGBA1C in the last 72 hours. CBG: No results for input(s): GLUCAP in the last 168 hours. Lipid Profile: No results for input(s): CHOL, HDL,  LDLCALC, TRIG, CHOLHDL, LDLDIRECT in the last 72 hours. Thyroid Function Tests: No results for input(s): TSH, T4TOTAL, FREET4, T3FREE, THYROIDAB in the last 72 hours. Anemia Panel: No results for input(s): VITAMINB12, FOLATE, FERRITIN, TIBC, IRON, RETICCTPCT in the last 72 hours.   Radiological Exams on Admission: No results found.  Assessment and Plan:   1.  Recurrent epistaxis: Hold Eliquis for now.  Patient not on any other antiplatelet agents.  Afrin spray as needed available.  Monitor overnight.  Rhino pack in place and patient comfortable.  Consider ENT evaluation if epistaxis recurs for cauterization.   Monitor hemoglobin every 12, transfuse if needed.  Will hold off on IV fluids as normotensive now.  Hold Lasix  2.  Paroxysmal atrial fibrillation:May need to hold anticoagulation temporarily for couple of days.  Resume other medications.  3.  Mild leukocytosis: Likely reactive secondary to problem #1  4.  CAD status post CABG: Patient not on any antiplatelet agents or AV blockers/antihypertensives  5.  Chronic diastolic CHF: Appears compensated.  Will hold Lasix for now given concern for volume loss and hypotension.  6.  Hyperlipidemia: Resume home medications  DVT prophylaxis: SCDs given risk of bleeding  Code Status: DNR as confirmed with patient and family  Family Communication: Discussed with patient and daughter bedside. Health care proxy would be daughter  Consults called: None Admission status:  Patient admitted as observation as anticipated LOS less than 2 midnights    Guilford Shi MD [facility] Pager [phone]  If 7PM-7AM, please contact night-coverage [email or web address] Password [facility]  12/26/2018, 7:13 PM

## 2018-12-27 DIAGNOSIS — I480 Paroxysmal atrial fibrillation: Secondary | ICD-10-CM | POA: Diagnosis not present

## 2018-12-27 DIAGNOSIS — I10 Essential (primary) hypertension: Secondary | ICD-10-CM

## 2018-12-27 DIAGNOSIS — R040 Epistaxis: Secondary | ICD-10-CM | POA: Diagnosis not present

## 2018-12-27 DIAGNOSIS — N182 Chronic kidney disease, stage 2 (mild): Secondary | ICD-10-CM | POA: Diagnosis not present

## 2018-12-27 DIAGNOSIS — L8990 Pressure ulcer of unspecified site, unspecified stage: Secondary | ICD-10-CM | POA: Diagnosis present

## 2018-12-27 DIAGNOSIS — I959 Hypotension, unspecified: Secondary | ICD-10-CM

## 2018-12-27 DIAGNOSIS — F0391 Unspecified dementia with behavioral disturbance: Secondary | ICD-10-CM | POA: Diagnosis not present

## 2018-12-27 LAB — CBC
HCT: 34.8 % — ABNORMAL LOW (ref 39.0–52.0)
HEMOGLOBIN: 11 g/dL — AB (ref 13.0–17.0)
MCH: 29.9 pg (ref 26.0–34.0)
MCHC: 31.6 g/dL (ref 30.0–36.0)
MCV: 94.6 fL (ref 80.0–100.0)
Platelets: 195 10*3/uL (ref 150–400)
RBC: 3.68 MIL/uL — ABNORMAL LOW (ref 4.22–5.81)
RDW: 15.1 % (ref 11.5–15.5)
WBC: 18.2 10*3/uL — ABNORMAL HIGH (ref 4.0–10.5)
nRBC: 0 % (ref 0.0–0.2)

## 2018-12-27 NOTE — Progress Notes (Signed)
Discharge information given to patient and daughter. Daughter is taken patient to [facility]

## 2018-12-27 NOTE — Discharge Summary (Addendum)
[name], is a 85 y.o. male  DOB 09/01/1917  MRN [id number].  Admission date:  12/26/2018  Admitting Physician  Paul Shi, MD  Discharge Date:  12/27/2018   Primary MD  Paul Koch, MD  Recommendations for primary care physician for things to follow:   Follow-up on blood counts, blood pressure, and need of restarting anticoagulation  Discharge Diagnosis    Principal Problem:   Epistaxis, recurrent Active Problems:   ATRIAL FIBRILLATION, PAROXYSMAL   Chronic diastolic CHF (congestive heart failure) (HCC)   Chronic kidney disease   Pacemaker   Pressure injury of skin   Dementia with behavioral disturbance (HCC)   Leukocytosis      Past Medical History:  Diagnosis Date  . Adenocarcinoma of prostate ([place]) 10/2005   lupron injections q31mo  . Arthritis    ?rheumatoid, never dx or on DMARDs  . Atrial fibrillation (HCC)    chronic anticoag  . Barrett's esophagus   . CAD (coronary artery disease)    CABG 08/1973  . CHF (congestive heart failure) ([place])    ICM  . Chronic renal insufficiency   . Diverticulosis of colon   . GERD (gastroesophageal reflux disease)   . Hyperlipidemia   . Hypertension   . Memory loss   . Osteoporosis   . Pacemaker 09/2003   RESULTS:  This demonstrates successful implantation of a Medtronic dual  . Paroxysmal atrial fibrillation ([place])   . Unsteady gait    due to B ankle DJD and instability    Past Surgical History:  Procedure Laterality Date  . CORONARY ARTERY BYPASS GRAFT  08/1973   3 heart heart arteries were 80 percent clogged. the lower valve of my heart is not functioning  . DOPPLER ECHOCARDIOGRAPHY  2004, 2008  . HERNIA REPAIR     age 5 ( not sure if it was right or left side)  . PACEMAKER GENERATOR CHANGE N/A 05/02/2013   Procedure: PACEMAKER  GENERATOR CHANGE;  Surgeon: Paul Lance, MD;  Location: [facility] CATH LAB;  Service: Cardiovascular;  Laterality: N/A;  . PACEMAKER PLACEMENT  09/2003       HPI  from the history and physical done on the day of admission:    [name] is a 85 y.o. male with history h/o hypertension, hyperlipidemia prostate cancer, CAD status post CABG, CHF, chronic renal insufficiency, atrial fibrillation on Eliquis presented early this morning from the assisted living facility with epistaxis.  Patient apparently woke up to a pool of blood on his sheets and staff figured that he was having nosebleed.  He was brought into the ED, nasal bleeding resolved with Afrin spray and pinching.  He was discharged back to the facility around 9 AM.  Within few minutes of arrival there patient received all his morning medications including Eliquis.  Epistaxis recurred and did not respond to conservative measures including Afrin spray and patient was transferred back here.  Bleeding was controlled with Rhino pack inserted by ED physician.  Patient had an episode  of hypotension with systolic in the 72Z and near syncopal event which could have been a vagal episode related to Rhino packing.  Blood pressure currently in systolic 366Y.  Given advanced age and recurrent epistaxis with hypotension, patient requested to be admitted for observation to medical service.  Currently resting comfortably.  Daughter at bedside and provided most of the history as patient is hard of hearing.    Hospital Course:   1.  Recurrent epistaxis: Resolved.  Patient on anticoagulants of Eliquis which were placed on hold.  Rhino rocket placed in the emergency department with resolution of bleeding symptoms.  Patient follows with Dr. Constance Greer of ENT.  Recommended follow-up with Dr. Constance Greer later this week week for removal of nasal packing.  Hemoglobin prior to discharge noted to be 11 without need for blood transfusion.   2.  Normocytic normochromic anemia:  Acute.  Hemoglobin noted to be as low as 9.8 on serial monitoring and previously had hemoglobin levels within normal limits.  Patient did not require blood transfusion and prior to discharge hemoglobin noted to be 11.  3.  Paroxysmal atrial fibrillation on chronic anticoagulation, status post pacemaker: Patient medications of Eliquis were held due to bleeding.  CHA2DS2-VASc score = at least 6. Recommended following up with Dr. Lovena Greer of cardiology on when to restart this medications.  4.  Leukocytosis: Acute.  WBC elevated from 12.3 to 18.2.  Patient without any signs of fever.  He had been given cephalexin 500 mg x 1 dose while in the emergency department.  No other imaging or urine studies obtained.  Suspected to likely be reactive in nature with bleeding and hypotensive episode.  Will need to follow-up for resolution of symptoms in outpatient setting.  5.  Transient hypotension/history of essential hypertension: While in the emergency department he was temporary hypotensive with blood pressures as low as 63/54 and near syncope following rhino pack insertion.  Suspected have been vagal in nature.  Blood pressures improved without significant intervention.  Lasix was initially held due to question of hypovolemia.  He was recommended to restart home medications of furosemide 20 mg daily with potassium supplementation at discharge as blood pressures were noted to be more stable.   6.  Chronic diastolic CHF: Stable.  Patient appears to be compensated.  Lasix was initially held on admission.  Recommended restarting regular home medications prior to discharge.  7.  Hyperlipidemia: Patient currently not on any cholesterol lowering medicines.  He had previously been on lovastatin, but unclear why this medication was discontinued or possibly never represcribed.  8.  Dementia with behavioral disturbance: At baseline patient oriented to person and place, but not time.  During his hospital stay he was noted to be  altered overnight and had removed clothing and parts of his nasal packing for epistaxis.  9.  Chronic kidney disease stage II: GFR is noted to be greater than 60.  10.  Stage I pressure ulcer: Present on admission note the sacrum.  Follow UP  Follow-up Information    Izora Gala, MD.   Specialty:  Otolaryngology Contact information: [address] [place] [address] [phone]            Consults obtained: None  Discharge Condition: Stable  Diet and Activity recommendation: See Discharge Instructions below   Discharge Instructions    Discharge instructions   Complete by:  As directed    Follow with Primary MD Paul Koch, MD in 7 days. Follow-up with your ENT  specialist Dr. Constance Greer to have the nasal rocket removed sometime this week.  It is recommended that you hold Eliquis until advised to restart by your cardiologist Dr. Lovena Greer or primary care provider.    Get CBC -checked  by Primary MD or SNF MD in 5-7 days ( we routinely change or add medications that can affect your baseline labs and fluid status, therefore we recommend that you get the mentioned basic workup next visit with your PCP, your PCP may decide not to get them or add new tests based on their clinical decision)  Activity: As tolerated with fall precautions use walker/cane & assistance as needed  Disposition: Assisted living facility   Diet: Heart healthy     For Heart failure patients - Check your Weight same time everyday, if you gain over 2 pounds, or you develop in leg swelling, experience more shortness of breath or chest pain, call your Primary doctor immediately. Follow Cardiac Low Salt Diet and 1.5 lit/day fluid restriction.  Special Instructions: If you have smoked or chewed Tobacco  in the last 2 yrs please stop smoking, stop any regular Alcohol  and or any Recreational drug use.  On your next visit with your primary care physician please Get Medicines reviewed and  adjusted.  Please request your Paul Koch, MD to go over all Hospital Tests and Procedure/Radiological results at the follow up, please get all Hospital records sent to your Prim MD by signing hospital release before you go home.  If you experience worsening of your admission symptoms, develop shortness of breath, life threatening emergency, suicidal or homicidal thoughts you must seek medical attention immediately by calling 911 or calling your MD immediately  if symptoms less severe.  You Must read complete instructions/literature along with all the possible adverse reactions/side effects for all the Medicines you take and that have been prescribed to you. Take any new Medicines after you have completely understood and accpet all the possible adverse reactions/side effects.   Do not drive, operate heavy machinery, perform activities at heights, swimming or participation in water activities or provide baby sitting services if your were admitted for syncope or siezures until you have seen by Primary MD or a Neurologist and advised to do so again.  Do not drive when taking Pain medications.  Do not take more than prescribed Pain, Sleep and Anxiety Medications  Wear Seat belts while driving.   Please note  You were cared for by a hospitalist during your hospital stay. If you have any questions about your discharge medications or the care you received while you were in the hospital after you are discharged, you can call the unit and asked to speak with the hospitalist on call if the hospitalist that took care of you is not available. Once you are discharged, your primary care physician will handle any further medical issues. Please note that NO REFILLS for any discharge medications will be authorized once you are discharged, as it is imperative that you return to your primary care physician (or establish a relationship with a primary care physician if you do not have one) for your aftercare  needs so that they can reassess your need for medications and monitor your lab values.        Discharge Medications     Allergies as of 12/27/2018   No Known Allergies     Medication List    STOP taking these medications   apixaban 5 MG Tabs tablet Commonly known as:  ELIQUIS  ELIQUIS 2.5 MG Tabs tablet Generic drug:  apixaban   mirtazapine 15 MG tablet Commonly known as:  REMERON   nystatin cream Commonly known as:  MYCOSTATIN     TAKE these medications   acetaminophen 650 MG CR tablet Commonly known as:  TYLENOL Take 650 mg by mouth See admin instructions. Take one tablet (650 mg) by mouth twice daily - 8am and 4pm   DESITIN 40 % Pste Generic drug:  Zinc Oxide Apply 1 application topically 3 (three) times daily.   furosemide 20 MG tablet Commonly known as:  LASIX Take 20 mg by mouth daily.   LUTEIN-ZEAXANTHIN PO Take 1 tablet by mouth daily.   nitroGLYCERIN 0.4 MG SL tablet Commonly known as:  NITROSTAT Place 1 tablet (0.4 mg total) under the tongue every 5 (five) minutes as needed. What changed:  reasons to take this   oxymetazoline 0.05 % nasal spray Commonly known as:  AFRIN Place 2-5 sprays into both nostrils as needed for congestion. What changed:  when to take this   potassium chloride 10 MEQ tablet Commonly known as:  K-DUR TAKE 1 TABLET (10 MEQ TOTAL) BY MOUTH DAILY. What changed:  See the new instructions.   ranitidine 150 MG tablet Commonly known as:  ZANTAC TAKE 1 TABLET (150 MG TOTAL) BY MOUTH AT BEDTIME. What changed:  See the new instructions.   SYNTHROID 25 MCG tablet Generic drug:  levothyroxine Take 25 mcg by mouth daily at 6 (six) AM.   vitamin C 500 MG tablet Commonly known as:  ASCORBIC ACID Take 500 mg by mouth daily.       Major procedures and Radiology Reports - PLEASE review detailed and final reports for all details, in brief -     No results found.  Micro Results     No results found for this or any  previous visit (from the past 240 hour(s)).     Today   Subjective    [name] has had no recurrence of bleeding.  Overnight patient was noted to be disoriented per nursing staff and removed part of the Rhino rocket that is used to deflate it. Objective   Blood pressure (!) 113/47, pulse 77, temperature (!) 97.5 F (36.4 C), temperature source Oral, resp. rate 18, height 5\' 4"  (1.626 m), weight 68.5 kg, SpO2 99 %.   Intake/Output Summary (Last 24 hours) at 12/27/2018 1600 Last data filed at 12/27/2018 0941 Gross per 24 hour  Intake 1480 ml  Output 50 ml  Net 1430 ml    Exam  Constitutional: Elderly male in NAD, calm, comfortable Eyes: PERRL, lids and conjunctivae normal ENMT: Mucous membranes are moist. Posterior pharynx clear of any exudate or lesions. Hard of hearing.  Rhino Rocket present in the right nare with dried blood present Neck: normal, supple, no masses, no thyromegaly Respiratory: clear to auscultation bilaterally, no wheezing, no crackles. Normal respiratory effort. No accessory muscle use.  Cardiovascular: Regular rate and rhythm, no murmurs / rubs / gallops. No extremity edema. 2+ pedal pulses. No carotid bruits.  Abdomen: no tenderness, no masses palpated. No hepatosplenomegaly. Bowel sounds positive.  Musculoskeletal: no clubbing / cyanosis. No joint deformity upper and lower extremities. Good ROM, no contractures. Normal muscle tone.  Skin: Stage I pressure ulcer noted of the sacrum. Neurologic: CN 2-12 grossly intact. Sensation intact, DTR normal. Strength 5/5 in all 4.  Psychiatric: Normal judgment and insight. Alert and oriented x person and place. Normal mood.    Data Review  CBC w Diff:  Lab Results  Component Value Date   WBC 18.2 (H) 12/27/2018   HGB 11.0 (L) 12/27/2018   HCT 34.8 (L) 12/27/2018   PLT 195 12/27/2018   LYMPHOPCT 20 05/25/2017   MONOPCT 12 05/25/2017   EOSPCT 2 05/25/2017   BASOPCT 0 05/25/2017    CMP:  Lab Results    Component Value Date   NA 140 12/26/2018   NA 142 11/24/2016   K 4.3 12/26/2018   CL 109 12/26/2018   CO2 24 12/26/2018   BUN 44 (H) 12/26/2018   BUN 13 11/24/2016   CREATININE 0.98 12/26/2018   GLU 97 11/24/2016   PROT 5.3 (L) 12/26/2018   ALBUMIN 3.0 (L) 12/26/2018   BILITOT 1.3 (H) 12/26/2018   ALKPHOS 136 (H) 12/26/2018   AST 40 12/26/2018   ALT 26 12/26/2018  .   Total Time in preparing paper work, data evaluation and todays exam - 35 minutes  Norval Morton M.D on 12/27/2018 at 4:00 PM  [facility]   Office  [phone]

## 2018-12-27 NOTE — Progress Notes (Signed)
MD Tamala Julian aware pt is AOx2, and pulled out "tail" of rhino rocket last night

## 2018-12-27 NOTE — Progress Notes (Signed)
Paged Dr Kennon Holter regarding Patient's rhino Plug. Awaiting any order that may be given.

## 2018-12-27 NOTE — Progress Notes (Signed)
Upon assessment, part of pt rhino plasty was on bedside table. Night shift RN informed MD pt pulled out.  Will continue to monitor

## 2018-12-28 ENCOUNTER — Telehealth: Payer: Self-pay | Admitting: Internal Medicine

## 2018-12-28 ENCOUNTER — Telehealth: Payer: Self-pay | Admitting: *Deleted

## 2018-12-28 DIAGNOSIS — F0391 Unspecified dementia with behavioral disturbance: Secondary | ICD-10-CM | POA: Diagnosis present

## 2018-12-28 DIAGNOSIS — D72829 Elevated white blood cell count, unspecified: Secondary | ICD-10-CM | POA: Diagnosis present

## 2018-12-28 DIAGNOSIS — F03918 Unspecified dementia, unspecified severity, with other behavioral disturbance: Secondary | ICD-10-CM | POA: Diagnosis present

## 2018-12-28 NOTE — Telephone Encounter (Signed)
Pt was on TCM report admitted for observation 12/26/18 for recurrent Epistaxis.  Pt woke up to a pool of blood on his sheets and staff figured that he was having nosebleed. He was brought into the ED, nasal bleeding resolved with Afrin spray and pinching. He was discharged back to the facility around [place]. Within few minutes of arrival there patient received all his morning medications including Eliquis. Epistaxis recurred and did not respond to conservative measures including Afrin spray and patient was transferred back here. Bleeding was controlled with Rhino pack. Pt no longer see Dr.crawford for PCP he is now seeing his doctor through the [place]. (Dr Delice Lesch)...[name]

## 2018-12-28 NOTE — Telephone Encounter (Signed)
Will forward to Dr. Lovena Le for advisement.

## 2018-12-28 NOTE — Telephone Encounter (Signed)
New Message   Pt c/o medication issue:  1. Name of Medication: Eliquis  2. How are you currently taking this medication (dosage and times per day)? PT is not currently taking, ER advised him not too until further notice  3. Are you having a reaction (difficulty breathing--STAT)? No  4. What is your medication issue? PT was seen in the ER on Sunday with a nose bleed that would not stop. The told him to stop taking the Eliquis until further notice ER inserted a nasal rocket to stop bleeding and will be having it removed on Friday.   PTs daughter is calling to see if they need to schedule an appt or when to start back on the Eliquis

## 2018-12-30 NOTE — Telephone Encounter (Signed)
Returned call to Pt wife.  Advised per Dr. Lovena Le do not restart Eliquis for one month.  Pt is due for yearly appt in April.    Advised wife we would get Pt in in March for early yearly follow up and to discuss timing of restarting Eliquis.  Wife in agreement.  Information given to scheduler.

## 2018-12-31 ENCOUNTER — Emergency Department (HOSPITAL_COMMUNITY): Payer: Medicare Other

## 2018-12-31 ENCOUNTER — Encounter (HOSPITAL_COMMUNITY): Payer: Self-pay | Admitting: Emergency Medicine

## 2018-12-31 ENCOUNTER — Observation Stay (HOSPITAL_COMMUNITY)
Admission: EM | Admit: 2018-12-31 | Discharge: 2019-01-01 | Disposition: A | Discharge status: Home / Self Care | Payer: Medicare Other | Attending: Family Medicine | Admitting: Family Medicine

## 2018-12-31 DIAGNOSIS — I480 Paroxysmal atrial fibrillation: Secondary | ICD-10-CM

## 2018-12-31 DIAGNOSIS — R040 Epistaxis: Secondary | ICD-10-CM | POA: Diagnosis present

## 2018-12-31 DIAGNOSIS — I2510 Atherosclerotic heart disease of native coronary artery without angina pectoris: Secondary | ICD-10-CM | POA: Diagnosis not present

## 2018-12-31 DIAGNOSIS — Z79899 Other long term (current) drug therapy: Secondary | ICD-10-CM | POA: Insufficient documentation

## 2018-12-31 DIAGNOSIS — F03918 Unspecified dementia, unspecified severity, with other behavioral disturbance: Secondary | ICD-10-CM | POA: Diagnosis present

## 2018-12-31 DIAGNOSIS — R7989 Other specified abnormal findings of blood chemistry: Secondary | ICD-10-CM | POA: Diagnosis not present

## 2018-12-31 DIAGNOSIS — R5381 Other malaise: Secondary | ICD-10-CM | POA: Diagnosis present

## 2018-12-31 DIAGNOSIS — F0391 Unspecified dementia with behavioral disturbance: Secondary | ICD-10-CM | POA: Insufficient documentation

## 2018-12-31 DIAGNOSIS — I5032 Chronic diastolic (congestive) heart failure: Secondary | ICD-10-CM | POA: Diagnosis not present

## 2018-12-31 DIAGNOSIS — Z515 Encounter for palliative care: Secondary | ICD-10-CM

## 2018-12-31 DIAGNOSIS — Z950 Presence of cardiac pacemaker: Secondary | ICD-10-CM | POA: Diagnosis present

## 2018-12-31 DIAGNOSIS — I214 Non-ST elevation (NSTEMI) myocardial infarction: Secondary | ICD-10-CM | POA: Diagnosis not present

## 2018-12-31 DIAGNOSIS — Z7189 Other specified counseling: Secondary | ICD-10-CM | POA: Diagnosis not present

## 2018-12-31 DIAGNOSIS — I10 Essential (primary) hypertension: Secondary | ICD-10-CM | POA: Diagnosis present

## 2018-12-31 DIAGNOSIS — Z87891 Personal history of nicotine dependence: Secondary | ICD-10-CM | POA: Insufficient documentation

## 2018-12-31 DIAGNOSIS — I130 Hypertensive heart and chronic kidney disease with heart failure and stage 1 through stage 4 chronic kidney disease, or unspecified chronic kidney disease: Secondary | ICD-10-CM | POA: Insufficient documentation

## 2018-12-31 DIAGNOSIS — R531 Weakness: Secondary | ICD-10-CM

## 2018-12-31 DIAGNOSIS — M79605 Pain in left leg: Secondary | ICD-10-CM

## 2018-12-31 DIAGNOSIS — N189 Chronic kidney disease, unspecified: Secondary | ICD-10-CM | POA: Diagnosis present

## 2018-12-31 DIAGNOSIS — I4891 Unspecified atrial fibrillation: Secondary | ICD-10-CM | POA: Diagnosis present

## 2018-12-31 DIAGNOSIS — R778 Other specified abnormalities of plasma proteins: Secondary | ICD-10-CM

## 2018-12-31 DIAGNOSIS — R4182 Altered mental status, unspecified: Secondary | ICD-10-CM | POA: Diagnosis present

## 2018-12-31 LAB — COMPREHENSIVE METABOLIC PANEL
ALT: 25 U/L (ref 0–44)
AST: 52 U/L — ABNORMAL HIGH (ref 15–41)
Albumin: 3.2 g/dL — ABNORMAL LOW (ref 3.5–5.0)
Alkaline Phosphatase: 132 U/L — ABNORMAL HIGH (ref 38–126)
Anion gap: 13 (ref 5–15)
BUN: 35 mg/dL — ABNORMAL HIGH (ref 8–23)
CHLORIDE: 107 mmol/L (ref 98–111)
CO2: 19 mmol/L — ABNORMAL LOW (ref 22–32)
Calcium: 8.4 mg/dL — ABNORMAL LOW (ref 8.9–10.3)
Creatinine, Ser: 1.15 mg/dL (ref 0.61–1.24)
GFR calc Af Amer: >60 mL/min (ref >60)
GFR, EST NON AFRICAN AMERICAN: 52 mL/min — AB (ref >60)
Glucose, Bld: 129 mg/dL — ABNORMAL HIGH (ref 70–99)
Potassium: 4.1 mmol/L (ref 3.5–5.1)
Sodium: 139 mmol/L (ref 135–145)
Total Bilirubin: 1.2 mg/dL (ref 0.3–1.2)
Total Protein: 5.8 g/dL — ABNORMAL LOW (ref 6.5–8.1)

## 2018-12-31 LAB — CBC WITH DIFFERENTIAL/PLATELET
ABS IMMATURE GRANULOCYTES: 0.08 10*3/uL — AB (ref 0.00–0.07)
Basophils Absolute: 0 10*3/uL (ref 0.0–0.1)
Basophils Relative: 0 %
Eosinophils Absolute: 0.1 10*3/uL (ref 0.0–0.5)
Eosinophils Relative: 1 %
HCT: 30.6 % — ABNORMAL LOW (ref 39.0–52.0)
HEMOGLOBIN: 9.1 g/dL — AB (ref 13.0–17.0)
Immature Granulocytes: 1 %
LYMPHS PCT: 11 %
Lymphs Abs: 1.3 10*3/uL (ref 0.7–4.0)
MCH: 29.7 pg (ref 26.0–34.0)
MCHC: 29.7 g/dL — AB (ref 30.0–36.0)
MCV: 100 fL (ref 80.0–100.0)
Monocytes Absolute: 1.4 10*3/uL — ABNORMAL HIGH (ref 0.1–1.0)
Monocytes Relative: 12 %
Neutro Abs: 8.3 10*3/uL — ABNORMAL HIGH (ref 1.7–7.7)
Neutrophils Relative %: 75 %
Platelets: 185 10*3/uL (ref 150–400)
RBC: 3.06 MIL/uL — ABNORMAL LOW (ref 4.22–5.81)
RDW: 15.7 % — ABNORMAL HIGH (ref 11.5–15.5)
WBC: 11.1 10*3/uL — AB (ref 4.0–10.5)
nRBC: 0.2 % (ref 0.0–0.2)

## 2018-12-31 LAB — TROPONIN I
Troponin I: 1.82 ng/mL (ref <0.03)
Troponin I: 1.49 ng/mL (ref <0.03)
Troponin I: 2.86 ng/mL (ref <0.03)
Troponin I: 2.53 ng/mL (ref <0.03)

## 2018-12-31 LAB — CBG MONITORING, ED: Glucose-Capillary: 107 mg/dL — ABNORMAL HIGH (ref 70–99)

## 2018-12-31 MED ORDER — ALBUTEROL SULFATE (2.5 MG/3ML) 0.083% IN NEBU: 2.5000 mg | INHALATION_SOLUTION | RESPIRATORY_TRACT | Status: DC | PRN

## 2018-12-31 MED ORDER — SODIUM CHLORIDE 0.9 % IV BOLUS
500.0000 mL | Freq: Once | INTRAVENOUS | Status: AC
  Administered 2018-12-31: 500 mL via INTRAVENOUS

## 2018-12-31 MED ORDER — ACETAMINOPHEN 650 MG RE SUPP: 650.0000 mg | Freq: Four times a day (QID) | RECTAL | Status: DC | PRN

## 2018-12-31 MED ORDER — NITROGLYCERIN 0.4 MG SL SUBL: 0.4000 mg | SUBLINGUAL_TABLET | SUBLINGUAL | Status: DC | PRN

## 2018-12-31 MED ORDER — ACETAMINOPHEN 325 MG PO TABS: 650.0000 mg | ORAL_TABLET | Freq: Four times a day (QID) | ORAL | Status: DC | PRN

## 2018-12-31 MED ORDER — FAMOTIDINE 20 MG PO TABS
20.0000 mg | ORAL_TABLET | Freq: Every day | ORAL | Status: DC
  Administered 2018-12-31 – 2019-01-01 (×2): 20 mg via ORAL
  Filled 2018-12-31 (×2): qty 1

## 2018-12-31 MED ORDER — FUROSEMIDE 20 MG PO TABS
20.0000 mg | ORAL_TABLET | Freq: Every day | ORAL | Status: DC
  Administered 2018-12-31 – 2019-01-01 (×2): 20 mg via ORAL
  Filled 2018-12-31 (×2): qty 1

## 2018-12-31 MED ORDER — POTASSIUM CHLORIDE CRYS ER 10 MEQ PO TBCR
10.0000 meq | EXTENDED_RELEASE_TABLET | Freq: Every day | ORAL | Status: DC
  Administered 2018-12-31 – 2019-01-01 (×2): 10 meq via ORAL
  Filled 2018-12-31 (×4): qty 1

## 2018-12-31 MED ORDER — VITAMIN C 500 MG PO TABS
500.0000 mg | ORAL_TABLET | Freq: Every day | ORAL | Status: DC
  Administered 2018-12-31 – 2019-01-01 (×2): 500 mg via ORAL
  Filled 2018-12-31 (×2): qty 1

## 2018-12-31 MED ORDER — LEVOTHYROXINE SODIUM 25 MCG PO TABS
25.0000 ug | ORAL_TABLET | Freq: Every day | ORAL | Status: DC
  Administered 2019-01-01: 25 ug via ORAL
  Filled 2018-12-31: qty 1

## 2018-12-31 MED ORDER — SODIUM CHLORIDE 0.9 % IV SOLN
INTRAVENOUS | Status: DC
  Administered 2018-12-31 – 2019-01-01 (×2): via INTRAVENOUS

## 2018-12-31 NOTE — H&P (Signed)
History and Physical    [name] FWY:[id number] DOB: [birth year] DOA: 12/31/2018  PCP: No primary care provider on file.  Patient coming from: Assisted living facility.  I have personally briefly reviewed patient's old medical records available.   Chief Complaint: Fall at assisted living place.  HPI: [name] is a 85 y.o. male with medical history significant of dementia, coronary artery disease status post CABG, GERD, ICD in place, hypertension, hyperlipidemia, chronic atrial fibrillation, recent epistaxis and intolerance to Eliquis who was brought to the emergency room by EMS when he was found more confused and unwitnessed fall at assisted living place.  Patient is poor historian.  He denies any complaints.  He is sleepy, alert on stimulation.  Denies any chest pain or shortness of breath.  He is hard of hearing and has no history to offer. Patient was recently in the hospital, I reviewed all his records and recent hospitalizations.  His daughter who is also healthcare power of attorney was at the bedside and we discussed in details.  According to the report from assisted living place, he was acting somehow confused today morning and was complaining of pain in his left leg and reportedly his blood pressure was low so they sent him to the ER.  Patient does have chronic leg pain and severe arthritis.  No recent history of fever or chills.  He was here last week with recurrent epistaxis on Eliquis, he had a Rhino pack that was planned to be removed today, however it might have fallen up as I do not see any Rhino pack. ED Course: Blood pressures are very stable.  Skeletal survey negative for any fractures.  WBC 11.1.  Troponin I 0.82.  EKG shows atrial fibrillation with left bundle branch block similar to previous EKG.  Patient is from an assisted living facility.  He is not able to get up and walk.  He will need hospitalization and further clarification of plan of care before  returning.  Review of Systems: Unable due to advanced dementia.   Past Medical History:  Diagnosis Date  . Adenocarcinoma of prostate ([place]) 10/2005   lupron injections q74mo  . Arthritis    ?rheumatoid, never dx or on DMARDs  . Atrial fibrillation ([facility])    chronic anticoag  . Barrett's esophagus   . CAD (coronary artery disease)    CABG 08/1973  . CHF (congestive heart failure) ([place])    ICM  . Chronic renal insufficiency   . Diverticulosis of colon   . GERD (gastroesophageal reflux disease)   . Hyperlipidemia   . Hypertension   . Memory loss   . Osteoporosis   . Pacemaker 09/2003   RESULTS:  This demonstrates successful implantation of a Medtronic dual  . Paroxysmal atrial fibrillation ([place])   . Unsteady gait    due to B ankle DJD and instability    Past Surgical History:  Procedure Laterality Date  . CORONARY ARTERY BYPASS GRAFT  08/1973   3 heart heart arteries were 80 percent clogged. the lower valve of my heart is not functioning  . DOPPLER ECHOCARDIOGRAPHY  2004, 2008  . HERNIA REPAIR     age 48 ( not sure if it was right or left side)  . PACEMAKER GENERATOR CHANGE N/A 05/02/2013   Procedure: PACEMAKER GENERATOR CHANGE;  Surgeon: Evans Lance, MD;  Location: [facility] CATH LAB;  Service: Cardiovascular;  Laterality: N/A;  . PACEMAKER PLACEMENT  09/2003     reports that he quit  smoking about 32 years ago. He has never used smokeless tobacco. He reports current alcohol use. No history on file for drug.  No Known Allergies  Family History  Problem Relation Age of Onset  . Cancer Mother   . Heart attack Father      Prior to Admission medications   Medication Sig Start Date End Date Taking? Authorizing Provider  acetaminophen (TYLENOL) 650 MG CR tablet Take 650 mg by mouth 2 (two) times daily. At 8am and 4pm   Yes [provider]  furosemide (LASIX) 20 MG tablet Take 20 mg by mouth daily.    Yes [provider]  levothyroxine (SYNTHROID) 25 MCG  tablet Take 25 mcg by mouth daily at 6 (six) AM.    Yes [provider]  LUTEIN-ZEAXANTHIN PO Take 1 tablet by mouth daily.    Yes [provider]  nitroGLYCERIN (NITROSTAT) 0.4 MG SL tablet Place 1 tablet (0.4 mg total) under the tongue every 5 (five) minutes as needed. Patient taking differently: Place 0.4 mg under the tongue every 5 (five) minutes as needed for chest pain.  02/21/15  Yes Josue Hector, MD  potassium chloride (K-DUR) 10 MEQ tablet TAKE 1 TABLET (10 MEQ TOTAL) BY MOUTH DAILY. Patient taking differently: Take 10 mEq by mouth daily.  09/12/15  Yes Hoyt Koch, MD  ranitidine (ZANTAC) 150 MG tablet TAKE 1 TABLET (150 MG TOTAL) BY MOUTH AT BEDTIME. Patient taking differently: Take 150 mg by mouth at bedtime.  12/24/15  Yes Hoyt Koch, MD  vitamin C (ASCORBIC ACID) 500 MG tablet Take 500 mg by mouth daily.    Yes [provider]  Zinc Oxide (DESITIN) 40 % PSTE Apply 1 application topically 3 (three) times daily.   Yes [provider]    Physical Exam: Vitals:   12/31/18 0900 12/31/18 0915 12/31/18 0930 12/31/18 1015  BP: 103/75 104/69 122/86 115/77  Pulse:   81 67  Resp: (!) 23 (!) 26 14 18   SpO2:   (!) 87% 99%    Constitutional: NAD, calm, comfortable Vitals:   12/31/18 0900 12/31/18 0915 12/31/18 0930 12/31/18 1015  BP: 103/75 104/69 122/86 115/77  Pulse:   81 67  Resp: (!) 23 (!) 26 14 18   SpO2:   (!) 87% 99%   Eyes: PERRL, lids and conjunctivae normal ENMT: Mucous membranes are dry . Posterior pharynx clear of any exudate or lesions.Normal dentition.  Neck: normal, supple, no masses, no thyromegaly Respiratory: clear to auscultation bilaterally, no wheezing, no crackles. Normal respiratory effort. No accessory muscle use.  Cardiovascular: Irregular rate and rhythm, no murmurs / rubs / gallops.  2+ pedal pulses. No carotid bruits.  AICD in place. Abdomen: no tenderness, no masses palpated. No hepatosplenomegaly.  Bowel sounds positive.  Musculoskeletal: no clubbing / cyanosis. No joint deformity upper and lower extremities. Good ROM, no contractures. Normal muscle tone.  Has 1+ edema bilateral lower extremity.  Has some bruises on the left shin with no active bleeding.  Compression stockings in place. Skin: no rashes, lesions, ulcers. No induration Neurologic: CN 2-12 grossly intact. Sensation intact, DTR normal. Strength 5/5 in all 4.  Psychiatric: Sleepy, lethargic and pleasantly confused.  Labs on Admission: I have personally reviewed following labs and imaging studies  CBC: Recent Labs  Lab 12/26/18 1420 12/26/18 1433 12/26/18 2005 12/27/18 0721 12/31/18 0813  WBC 12.3*  --   --  18.2* 11.1*  NEUTROABS  --   --   --   --  8.3*  HGB 10.4* 10.5* 9.8* 11.0* 9.1*  HCT 33.8* 31.0* 31.5* 34.8* 30.6*  MCV 94.9  --   --  94.6 100.0  PLT 205  --   --  195 622   Basic Metabolic Panel: Recent Labs  Lab 12/26/18 1420 12/26/18 1433 12/31/18 0813  NA 140 140 139  K 4.4 4.3 4.1  CL 109  --  107  CO2 24  --  19*  GLUCOSE 119*  --  129*  BUN 44*  --  35*  CREATININE 0.98  --  1.15  CALCIUM 8.9  --  8.4*   GFR: Estimated Creatinine Clearance: 29.3 mL/min (by C-G formula based on SCr of 1.15 mg/dL). Liver Function Tests: Recent Labs  Lab 12/26/18 1420 12/31/18 0813  AST 40 52*  ALT 26 25  ALKPHOS 136* 132*  BILITOT 1.3* 1.2  PROT 5.3* 5.8*  ALBUMIN 3.0* 3.2*   No results for input(s): LIPASE, AMYLASE in the last 168 hours. No results for input(s): AMMONIA in the last 168 hours. Coagulation Profile: Recent Labs  Lab 12/26/18 1420  INR 1.42   Cardiac Enzymes: Recent Labs  Lab 12/31/18 0813  TROPONINI 1.82*   BNP (last 3 results) No results for input(s): PROBNP in the last 8760 hours. HbA1C: No results for input(s): HGBA1C in the last 72 hours. CBG: Recent Labs  Lab 12/31/18 0814  GLUCAP 107*   Lipid Profile: No results for input(s): CHOL, HDL, LDLCALC, TRIG,  CHOLHDL, LDLDIRECT in the last 72 hours. Thyroid Function Tests: No results for input(s): TSH, T4TOTAL, FREET4, T3FREE, THYROIDAB in the last 72 hours. Anemia Panel: No results for input(s): VITAMINB12, FOLATE, FERRITIN, TIBC, IRON, RETICCTPCT in the last 72 hours. Urine analysis:    Component Value Date/Time   COLORURINE YELLOW 02/19/2017 1530   APPEARANCEUR HAZY (A) 02/19/2017 1530   LABSPEC >1.046 (H) 02/19/2017 1530   PHURINE 7.0 02/19/2017 1530   GLUCOSEU NEGATIVE 02/19/2017 1530   HGBUR NEGATIVE 02/19/2017 1530   BILIRUBINUR NEGATIVE 02/19/2017 1530   KETONESUR NEGATIVE 02/19/2017 1530   PROTEINUR NEGATIVE 02/19/2017 1530   UROBILINOGEN 0.2 11/04/2014 1215   NITRITE NEGATIVE 02/19/2017 1530   LEUKOCYTESUR LARGE (A) 02/19/2017 1530    Radiological Exams on Admission: Dg Chest 1 View  Result Date: 12/31/2018 CLINICAL DATA:  Altered level of consciousness EXAM: CHEST  1 VIEW COMPARISON:  05/25/2017 FINDINGS: Limited low volume chest. Cardiomegaly with CABG and dual-chamber pacer implant. Streaky density at the bases, chronic based on prior. Equivocal for hazy airspace density over the right mid lung. Trace right pleural effusion. No acute osseous finding. IMPRESSION: 1. Hazy density over the right mid chest, nonspecific in this setting. Please correlate for symptoms of pneumonia. 2. Cardiomegaly and trace right pleural effusion. Electronically Signed   By: Monte Fantasia M.D.   On: 12/31/2018 08:55   Dg Pelvis 1-2 Views  Result Date: 12/31/2018 CLINICAL DATA:  Patient with a history of dementia complaining of left upper leg and hip pain. Initial encounter. EXAM: PELVIS - 1-2 VIEW COMPARISON:  Single-view of the pelvis 03/03/2017. FINDINGS: No acute bony or joint abnormality is identified. There is some degenerative disease about the hips. Soft tissues demonstrate atherosclerosis. Bowel loops seen below the right inferior pubic ramus are consistent with a right inguinal hernia.  IMPRESSION: No acute abnormality. Electronically Signed   By: Inge Rise M.D.   On: 12/31/2018 08:58   Ct Head Wo Contrast  Result Date: 12/31/2018 CLINICAL DATA:  Fall, altered level of  consciousness. EXAM: CT HEAD WITHOUT CONTRAST CT CERVICAL SPINE WITHOUT CONTRAST TECHNIQUE: Multidetector CT imaging of the head and cervical spine was performed following the standard protocol without intravenous contrast. Multiplanar CT image reconstructions of the cervical spine were also generated. COMPARISON:  CT scan of May 25, 2017. FINDINGS: CT HEAD FINDINGS Brain: Mild diffuse cortical atrophy is noted. Mild chronic ischemic white matter disease is noted. No mass effect or midline shift is noted. Ventricular size is within normal limits. There is no evidence of mass lesion, hemorrhage or acute infarction. Vascular: No hyperdense vessel or unexpected calcification. Skull: Normal. Negative for fracture or focal lesion. Sinuses/Orbits: No acute finding. Other: None. CT CERVICAL SPINE FINDINGS Alignment: Normal. Skull base and vertebrae: No acute fracture. No primary bone lesion or focal pathologic process. Soft tissues and spinal canal: No prevertebral fluid or swelling. No visible canal hematoma. Disc levels: Moderate degenerative disc disease is noted at C5-6, C6-7 and C7-T1. Upper chest: Right pleural effusion is noted in right lung apex. Other: Degenerative changes are seen involving the left-sided posterior facet joints. IMPRESSION: Mild diffuse cortical atrophy. Mild chronic ischemic white matter disease. No acute intracranial abnormality seen. Moderate multilevel degenerative disc disease. No acute abnormality seen in the cervical spine. Electronically Signed   By: Marijo Conception, M.D.   On: 12/31/2018 09:19   Ct Cervical Spine Wo Contrast  Result Date: 12/31/2018 CLINICAL DATA:  Fall, altered level of consciousness. EXAM: CT HEAD WITHOUT CONTRAST CT CERVICAL SPINE WITHOUT CONTRAST TECHNIQUE:  Multidetector CT imaging of the head and cervical spine was performed following the standard protocol without intravenous contrast. Multiplanar CT image reconstructions of the cervical spine were also generated. COMPARISON:  CT scan of May 25, 2017. FINDINGS: CT HEAD FINDINGS Brain: Mild diffuse cortical atrophy is noted. Mild chronic ischemic white matter disease is noted. No mass effect or midline shift is noted. Ventricular size is within normal limits. There is no evidence of mass lesion, hemorrhage or acute infarction. Vascular: No hyperdense vessel or unexpected calcification. Skull: Normal. Negative for fracture or focal lesion. Sinuses/Orbits: No acute finding. Other: None. CT CERVICAL SPINE FINDINGS Alignment: Normal. Skull base and vertebrae: No acute fracture. No primary bone lesion or focal pathologic process. Soft tissues and spinal canal: No prevertebral fluid or swelling. No visible canal hematoma. Disc levels: Moderate degenerative disc disease is noted at C5-6, C6-7 and C7-T1. Upper chest: Right pleural effusion is noted in right lung apex. Other: Degenerative changes are seen involving the left-sided posterior facet joints. IMPRESSION: Mild diffuse cortical atrophy. Mild chronic ischemic white matter disease. No acute intracranial abnormality seen. Moderate multilevel degenerative disc disease. No acute abnormality seen in the cervical spine. Electronically Signed   By: Marijo Conception, M.D.   On: 12/31/2018 09:19   Dg Femur Min 2 Views Left  Result Date: 12/31/2018 CLINICAL DATA:  Patient with a history of dementia complaining of left upper leg pain. Trauma history is unknown. Initial encounter. EXAM: LEFT FEMUR 2 VIEWS COMPARISON:  None. FINDINGS: No acute bony or joint abnormality is seen. Soft tissues demonstrate atherosclerosis. No acute soft tissue abnormality. IMPRESSION: No acute finding. Atherosclerosis. Electronically Signed   By: Inge Rise M.D.   On: 12/31/2018 08:55     EKG: Independently reviewed.  A. fib.  Left bundle branch block present on previous EKGs.  Assessment/Plan Principal Problem:   NSTEMI, initial episode of care [facility]) Active Problems:   Essential hypertension   ATRIAL FIBRILLATION, PAROXYSMAL   Chronic diastolic CHF (  congestive heart failure) (HCC)   Chronic kidney disease   Pacemaker   Epistaxis, recurrent   Dementia with behavioral disturbance (HCC)   Physical debility     1.  Non-STEMI: Suspect non-STEMI type II.  No active chest pain, however difficult to assess on this patient with dementia.  EKG does not show any ST changes. Recent severe epistaxis and discontinuation of anticoagulation. Patient will not tolerate aspirin and also discussing about possible hospice.  Will not restart on aspirin.  We will continue to trend troponin just to prognosticate his outcome. Discussed this in details with daughter at the bedside and she agreed. Discussed palliative care consultation, hospice education and probable candidate for hospice home versus hospice at assisted living facility.  2.  Physical debility: Advanced physical debility and multiple medical problems.  Hospice care appropriate.  He will work with PT OT to determine level of care he needs.  Palliative consultation.  3.  Epistaxis: Improved.  No active issues.  Hemoglobin is stable.  4.  Paroxysmal A. fib: Rate controlled.  Intolerance to anticoagulation and antiplatelets.  5.  Dementia with behavioral disturbances: All-time fall precautions.  Delirium precautions.  I discussed with patient's daughter at the bedside that we can check troponins to see whether he is having any active ischemia, however he is intolerance to treatment and she also does not anticipate any procedure or treatment.  Checking serial troponins will only tell us whether he is having active heart attack.  Daughter wants to know whether he is having active heart attack.  We discussed that patient should  ambulate to determine next level of care.   DVT prophylaxis: SCDs. Code Status: DNR/DNI. Family Communication: Daughter at bedside. Disposition Plan: Assisted living facility. Consults called: None. Admission status: Inpatient cardiac telemetry.   Barb Merino MD [facility] Pager [phone]  If 7PM-7AM, please contact night-coverage [email or web address] Password [facility]  12/31/2018, 10:42 AM

## 2018-12-31 NOTE — ED Provider Notes (Signed)
[facility] EMERGENCY DEPARTMENT Provider Note   CSN: [id number] Arrival date & time: 12/31/18  0803    History   Chief Complaint Chief Complaint  Patient presents with  . Altered Mental Status    HPI [name] is a 85 y.o. male.     Patient is a 85 year old male brought from his assisted living facility for evaluation of altered mental status.  The history I am given by EMS is as follows: The patient apparently fell in the night, then went back to bed.  When he woke this morning he was somewhat confused and complaining of pain in his left leg.  EMS was called and the patient was transported here.  He was found to initially have a blood pressure in the 60s, however this now has improved with IV fluids.  The patient is somewhat difficult to get a history from himself as he appears confused and not answering questions appropriately.  His medical history includes coronary artery disease with CABG, CHF, chronic renal insufficiency, prostate cancer, and recent admission for epistaxis requiring packing and stoppage of his blood thinners.  The history is provided by the patient and the EMS personnel.  Altered Mental Status  Presenting symptoms: disorientation   Severity:  Moderate Most recent episode:  Today Episode history:  Single Timing:  Constant Progression:  Worsening Chronicity:  New Context: nursing home resident     Past Medical History:  Diagnosis Date  . Adenocarcinoma of prostate ([place]) 10/2005   lupron injections q37mo  . Arthritis    ?rheumatoid, never dx or on DMARDs  . Atrial fibrillation (HCC)    chronic anticoag  . Barrett's esophagus   . CAD (coronary artery disease)    CABG 08/1973  . CHF (congestive heart failure) ([place])    ICM  . Chronic renal insufficiency   . Diverticulosis of colon   . GERD (gastroesophageal reflux disease)   . Hyperlipidemia   . Hypertension   . Memory loss   . Osteoporosis   . Pacemaker 09/2003   RESULTS:  This demonstrates successful implantation of a Medtronic dual  . Paroxysmal atrial fibrillation ([employer])   . Unsteady gait    due to B ankle DJD and instability    Patient Active Problem List   Diagnosis Date Noted  . Dementia with behavioral disturbance ([place]) 12/28/2018  . Leukocytosis 12/28/2018  . Pressure injury of skin 12/27/2018  . Epistaxis, recurrent 12/26/2018  . Dysphagia 03/08/2015  . Pacemaker 01/30/2015  . Aortic stenosis 01/30/2015  . CVA (cerebral infarction) 11/04/2014  . Mitral valve disorder 01/07/2011  . OSTEOPOROSIS 08/13/2010  . Chronic kidney disease 04/04/2009  . UNSTEADY GAIT 12/19/2008  . ATRIAL FIBRILLATION, PAROXYSMAL 09/29/2008  . BARRETTS ESOPHAGUS 12/02/2007  . ADENOCARCINOMA, PROSTATE, HX OF 11/16/2007  . Essential hypertension 10/11/2007  . Elevated lipids 05/20/2007  . Coronary atherosclerosis 05/20/2007  . Chronic diastolic CHF (congestive heart failure) ([place]) 05/20/2007    Past Surgical History:  Procedure Laterality Date  . CORONARY ARTERY BYPASS GRAFT  08/1973   3 heart heart arteries were 80 percent clogged. the lower valve of my heart is not functioning  . DOPPLER ECHOCARDIOGRAPHY  2004, 2008  . HERNIA REPAIR     age 18 ( not sure if it was right or left side)  . PACEMAKER GENERATOR CHANGE N/A 05/02/2013   Procedure: PACEMAKER GENERATOR CHANGE;  Surgeon: Evans Lance, MD;  Location: [facility] CATH LAB;  Service: Cardiovascular;  Laterality: N/A;  . PACEMAKER  PLACEMENT  09/2003        Home Medications    Prior to Admission medications   Medication Sig Start Date End Date Taking? Authorizing Provider  acetaminophen (TYLENOL) 650 MG CR tablet Take 650 mg by mouth See admin instructions. Take one tablet (650 mg) by mouth twice daily - 8am and 4pm    [provider]  furosemide (LASIX) 20 MG tablet Take 20 mg by mouth daily.     [provider]  levothyroxine (SYNTHROID) 25 MCG tablet Take 25 mcg by mouth daily at  6 (six) AM.     [provider]  LUTEIN-ZEAXANTHIN PO Take 1 tablet by mouth daily.     [provider]  nitroGLYCERIN (NITROSTAT) 0.4 MG SL tablet Place 1 tablet (0.4 mg total) under the tongue every 5 (five) minutes as needed. Patient taking differently: Place 0.4 mg under the tongue every 5 (five) minutes as needed for chest pain.  02/21/15   Josue Hector, MD  oxymetazoline (AFRIN) 0.05 % nasal spray Place 2-5 sprays into both nostrils as needed for congestion. Patient taking differently: Place 2-5 sprays into both nostrils 2 (two) times daily as needed for congestion.  12/26/18 12/26/19  Bufford Lope, DO  potassium chloride (K-DUR) 10 MEQ tablet TAKE 1 TABLET (10 MEQ TOTAL) BY MOUTH DAILY. Patient taking differently: Take 10 mEq by mouth daily.  09/12/15   Hoyt Koch, MD  ranitidine (ZANTAC) 150 MG tablet TAKE 1 TABLET (150 MG TOTAL) BY MOUTH AT BEDTIME. Patient taking differently: Take 150 mg by mouth at bedtime.  12/24/15   Hoyt Koch, MD  vitamin C (ASCORBIC ACID) 500 MG tablet Take 500 mg by mouth daily.     [provider]  Zinc Oxide (DESITIN) 40 % PSTE Apply 1 application topically 3 (three) times daily.    [provider]    Family History Family History  Problem Relation Age of Onset  . Cancer Mother   . Heart attack Father     Social History Social History   Tobacco Use  . Smoking status: Former Smoker    Last attempt to quit: 08/05/1986    Years since quitting: 32.4  . Smokeless tobacco: Never Used  Substance Use Topics  . Alcohol use: Yes    Comment: samll glass of wine  . Drug use: Not on file     Allergies   Patient has no known allergies.   Review of Systems Review of Systems  Unable to perform ROS: Mental status change     Physical Exam Updated Vital Signs BP 105/65   Pulse 84   Resp (!) 33   SpO2 99%   Physical Exam Vitals signs and nursing note reviewed.  Constitutional:       Appearance: He is well-developed.     Comments: Patient is an elderly male.  He is awake and alert, but appears somewhat confused and agitated.  He is unable to provide a clear history.  HENT:     Head: Normocephalic and atraumatic.  Eyes:     Extraocular Movements: Extraocular movements intact.     Pupils: Pupils are equal, round, and reactive to light.  Neck:     Musculoskeletal: Normal range of motion and neck supple.  Cardiovascular:     Rate and Rhythm: Normal rate and regular rhythm.     Heart sounds: Murmur present. No friction rub.  Pulmonary:     Effort: Pulmonary effort is normal. No respiratory distress.  Breath sounds: Normal breath sounds. No wheezing or rales.  Abdominal:     General: Bowel sounds are normal. There is no distension.     Palpations: Abdomen is soft.     Tenderness: There is no abdominal tenderness.  Musculoskeletal: Normal range of motion.     Comments: Both lower extremities are somewhat edematous with compression stockings.  There is no shortening or rotation.  He does seem to have tenderness with palpation of the left mid femur.  Skin:    General: Skin is warm and dry.  Neurological:     Mental Status: He is alert and oriented to person, place, and time.     Cranial Nerves: No cranial nerve deficit.     Coordination: Coordination normal.      ED Treatments / Results  Labs (all labs ordered are listed, but only abnormal results are displayed) Labs Reviewed  COMPREHENSIVE METABOLIC PANEL  CBC WITH DIFFERENTIAL/PLATELET  TROPONIN I  URINALYSIS, DIPSTICK ONLY    EKG None  Radiology No results found.  Procedures Procedures (including critical care time)  Medications Ordered in ED Medications  sodium chloride 0.9 % bolus 500 mL (has no administration in time range)     Initial Impression / Assessment and Plan / ED Course  I have reviewed the triage vital signs and the nursing notes.  Pertinent labs & imaging results that were  available during my care of the patient were reviewed by me and considered in my medical decision making (see chart for details).  Patient presents with increased confusion, weakness that started in the night.  He apparently fell, then went back to bed.  His work-up consists of negative head CT and cervical spine.  X-rays show no sign of fracture in the pelvis, left hip, and left femur.  His EKG shows a left bundle branch block which is similar in appearance to prior EKGs, but does have an elevated troponin of 1.8.  Patient is not complaining of any chest discomfort.  Patient remains weak and somewhat disoriented.  In discussion with the patient's daughter who is at bedside, the patient is a DNR.  We also discussed the possibility of possible intervention for the troponin elevation, however the daughter and I both agree that comfort measures are the most appropriate course of action.  Patient will be admitted to the hospital for observation.  He appears weak and I do not feel as though he is appropriate for discharge to his assisted living facility.  Final Clinical Impressions(s) / ED Diagnoses   Final diagnoses:  None    ED Discharge Orders    None       Veryl Speak, MD 12/31/18 1052

## 2018-12-31 NOTE — ED Notes (Signed)
Patient transported to X-ray 

## 2018-12-31 NOTE — ED Triage Notes (Signed)
Pt here from [facility] with c/o aloc and hypotension, pt fell at the facility around 4 am , 500 of fluids given in route

## 2018-12-31 NOTE — ED Notes (Signed)
Returned from xray

## 2018-12-31 NOTE — Consult Note (Signed)
Consultation Note Date: 12/31/2018   Patient Name: [name]  DOB: January 26, 1917  MRN: [id number]  Age / Sex: 85 y.o., male  PCP: Hoyt Koch, MD Referring Physician: Barb Merino, MD  Reason for Consultation: Establishing goals of care, Hospice Evaluation and Psychosocial/spiritual support  HPI/Patient Profile: 85 y.o. male  with past medical history of prostate cancer on Lupron, congestive heart failure, pacemaker, ischemic cardiomyopathy, A. fib, GERD, coronary artery disease status post CABG, dementia admitted on 12/31/2018 with altered mental status as well as unwitnessed fall at assisted living facility.  Patient was just here on 12/26/2018 secondary to recurrent episodes of epistaxis.  His anticoagulation was discontinued at that time.  CT of the head and spine did not reveal any acute processes such as CVA, or fracture.  Patient had elevated troponin of 1.82 on admission.  X-ray revealed cardiomegaly; hazy density right mid chest, clinically correlate for pneumonia  Consult ordered for goals of care and hospice evaluation.   Clinical Assessment and Goals of Care: Patient seen, chart reviewed.  Spoke to patient's daughter, Renata Caprice who is also healthcare power of attorney.  Patient continues to be very somnolent.  Ms. Annabell Sabal describes her father's baseline as using a rolling walker, living in an assisted living facility, [facility], with his spouse.  Mr. Yuhas has what is called level 1 care at [facility] where he receives more assistance with ADLs.  Mr. Klatt and his wife have been residents of [facility] now for 2 years.  Prior to this he lives with Ms. Annabell Sabal.  Mr. 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  Patient is unable to participate in goals of care secondary to altered mental status.  His healthcare proxy, healthcare POA, is his daughter, Renata Caprice at  [phone]    SUMMARY OF RECOMMENDATIONS   DNR/DNI She is hopeful he will be discharged back to assisted living facility tomorrow She is aware that he may need more care than what he has been receiving prior to this admission.  She states she has already spoken to [facility] facility and they can offer more assistance, services, as well as physical therapy and Occupational Therapy. Her goal is to keep her parents together for as long as possible Care limits of DNR set, otherwise treat the treatable Patient and family would benefit from ongoing goals of care discussion at the facility.  Would recommend community palliative care providers see patient at [facility].  Those services can be accessed through either [facility] at [phone] or [facility] palliative medicine division at [phone]  Code Status/Advance Care Planning:  DNR    Symptom Management:   Pain: Patient has a report of severe ankle pain secondary to severe arthritis.  Continue with Tylenol; would recommend scheduled dosing  Palliative Prophylaxis:   Aspiration, Bowel Regimen, Delirium Protocol, Eye Care, Frequent Pain Assessment, Oral Care and Turn Reposition     Psycho-social/Spiritual:   Desire for further Chaplaincy support:no  Additional Recommendations: Referral to Community Resources   Prognosis:  Unable to determine   Disposition: Back to assisted living facility.  Family anticipating higher care needs and assisted living facility is stating they can provide that.  Patient's daughter has already been in contact with facility regarding physical therapy occupational therapy and increased ADL support      Primary Diagnoses: Present on Admission: . NSTEMI, initial episode of care [facility]) . Essential hypertension . ATRIAL FIBRILLATION, PAROXYSMAL . Chronic diastolic CHF (congestive heart failure) ([place]) . Chronic kidney disease . Pacemaker . Epistaxis,  recurrent . Dementia with behavioral disturbance ([place]) . Physical debility   I have reviewed the medical record, interviewed the patient and family, and examined the patient. The following aspects are pertinent.  Past Medical History:  Diagnosis Date  . Adenocarcinoma of prostate ([place]) 10/2005   lupron injections q20mo  . Arthritis    ?rheumatoid, never dx or on DMARDs  . Atrial fibrillation (HCC)    chronic anticoag  . Barrett's esophagus   . CAD (coronary artery disease)    CABG 08/1973  . CHF (congestive heart failure) ([place])    ICM  . Chronic renal insufficiency   . Diverticulosis of colon   . GERD (gastroesophageal reflux disease)   . Hyperlipidemia   . Hypertension   . Memory loss   . Osteoporosis   . Pacemaker 09/2003   RESULTS:  This demonstrates successful implantation of a Medtronic dual  . Paroxysmal atrial fibrillation ([place])   . Unsteady gait    due to B ankle DJD and instability   Social History   Socioeconomic History  . Marital status: Married    Spouse name: Not on file  . Number of children: Not on file  . Years of education: Not on file  . Highest education level: Not on file  Occupational History  . Not on file  Social Needs  . Financial resource strain: Not on file  . Food insecurity:    Worry: Not on file    Inability: Not on file  . Transportation needs:    Medical: Not on file    Non-medical: Not on file  Tobacco Use  . Smoking status: Former Smoker    Last attempt to quit: 08/05/1986    Years since quitting: 32.4  . Smokeless tobacco: Never Used  Substance and Sexual Activity  . Alcohol use: Yes    Comment: samll glass of wine  . Drug use: Not on file  . Sexual activity: Not on file  Lifestyle  . Physical activity:    Days per week: Not on file    Minutes per session: Not on file  . Stress: Not on file  Relationships  . Social connections:    Talks on phone: Not on file    Gets together: Not on file    Attends religious  service: Not on file    Active member of club or organization: Not on file    Attends meetings of clubs or organizations: Not on file    Relationship status: Not on file  Other Topics Concern  . Not on file  Social History Narrative   Married  - lives with spouse and supportive daugher   Former Smoker -  quit tobacco 25 years ago     Alcohol use-yes (small glass of wine)                  Family History  Problem Relation Age of Onset  . Cancer  Mother   . Heart attack Father    Scheduled Meds: . famotidine  20 mg Oral Daily  . furosemide  20 mg Oral Daily  . [START ON 2019-01-05] levothyroxine  25 mcg Oral Q0600  . potassium chloride  10 mEq Oral Daily  . vitamin C  500 mg Oral Daily   Continuous Infusions: . sodium chloride 75 mL/hr at 12/31/18 1326   PRN Meds:.acetaminophen **OR** acetaminophen, albuterol, nitroGLYCERIN Medications Prior to Admission:  Prior to Admission medications   Medication Sig Start Date End Date Taking? Authorizing Provider  acetaminophen (TYLENOL) 650 MG CR tablet Take 650 mg by mouth 2 (two) times daily. At 8am and 4pm   Yes [provider]  furosemide (LASIX) 20 MG tablet Take 20 mg by mouth daily.    Yes [provider]  levothyroxine (SYNTHROID) 25 MCG tablet Take 25 mcg by mouth daily at 6 (six) AM.    Yes [provider]  LUTEIN-ZEAXANTHIN PO Take 1 tablet by mouth daily.    Yes [provider]  nitroGLYCERIN (NITROSTAT) 0.4 MG SL tablet Place 1 tablet (0.4 mg total) under the tongue every 5 (five) minutes as needed. Patient taking differently: Place 0.4 mg under the tongue every 5 (five) minutes as needed for chest pain.  02/21/15  Yes Josue Hector, MD  potassium chloride (K-DUR) 10 MEQ tablet TAKE 1 TABLET (10 MEQ TOTAL) BY MOUTH DAILY. Patient taking differently: Take 10 mEq by mouth daily.  09/12/15  Yes Hoyt Koch, MD  ranitidine (ZANTAC) 150 MG tablet TAKE 1 TABLET (150 MG TOTAL) BY MOUTH AT  BEDTIME. Patient taking differently: Take 150 mg by mouth at bedtime.  12/24/15  Yes Hoyt Koch, MD  vitamin C (ASCORBIC ACID) 500 MG tablet Take 500 mg by mouth daily.    Yes [provider]  Zinc Oxide (DESITIN) 40 % PSTE Apply 1 application topically 3 (three) times daily.   Yes [provider]   No Known Allergies Review of Systems  Unable to perform ROS: Mental status change    Physical Exam Vitals signs and nursing note reviewed.  Constitutional:      Appearance: He is ill-appearing.     Comments: Elderly man; somnolent, pale  HENT:     Head: Normocephalic and atraumatic.  Cardiovascular:     Rate and Rhythm: Normal rate.  Pulmonary:     Effort: Pulmonary effort is normal.  Skin:    General: Skin is warm and dry.     Coloration: Skin is pale.  Neurological:     Comments: Unable to test.  Patient very somnolent  Psychiatric:     Comments: No overt agitation otherwise unable to test     Vital Signs: BP 105/67 (BP Location: Right Arm)   Pulse 72   Temp 98.1 F (36.7 C) (Oral)   Resp 14   Ht 5\' 6"  (1.676 m)   Wt 69.4 kg   SpO2 94%   BMI 24.69 kg/m      Pain Score: 8    SpO2: SpO2: 94 % O2 Device:SpO2: 94 % O2 Flow Rate: .O2 Flow Rate (L/min): 4 L/min  IO: Intake/output summary: No intake or output data in the 24 hours ending 12/31/18 1654  LBM:   Baseline Weight: Weight: 69.4 kg Most recent weight: Weight: 69.4 kg     Palliative Assessment/Data:   Flowsheet Rows     Most Recent Value  Intake Tab  Referral Department  Hospitalist  Unit at  Time of Referral  Med/Surg Unit  Palliative Care Primary Diagnosis  Cardiac  Date Notified  12/31/18  Palliative Care Type  New Palliative care  Reason for referral  Clarify Goals of Care, Psychosocial or Spiritual support, Counsel Regarding Hospice  Date of Admission  12/31/18  Date first seen by Palliative Care  12/31/18  # of days Palliative referral response time  0 Day(s)  # of  days IP prior to Palliative referral  0  Clinical Assessment  Palliative Performance Scale Score  40%  Pain Max last 24 hours  Not able to report  Pain Min Last 24 hours  Not able to report  Dyspnea Max Last 24 Hours  Not able to report  Dyspnea Min Last 24 hours  Not able to report  Nausea Max Last 24 Hours  Not able to report  Nausea Min Last 24 Hours  Not able to report  Anxiety Max Last 24 Hours  Not able to report  Anxiety Min Last 24 Hours  Not able to report  Other Max Last 24 Hours  Not able to report  Psychosocial & Spiritual Assessment  Palliative Care Outcomes  Patient/Family meeting held?  Yes  Who was at the meeting?  dtr  Palliative Care Outcomes  Provided psychosocial or spiritual support, Counseled regarding hospice  Patient/Family wishes: Interventions discontinued/not started   Mechanical Ventilation  Palliative Care follow-up planned  Yes, Facility      Time In: 1600 Time Out: 1650 Time Total: 50 min Greater than 50%  of this time was spent counseling and coordinating care related to the above assessment and plan.  Signed by: Dory Horn, NP   Please contact Palliative Medicine Team phone at [phone] for questions and concerns.  For individual provider: See Shea Evans

## 2019-01-01 ENCOUNTER — Encounter (HOSPITAL_COMMUNITY): Payer: Self-pay | Admitting: Emergency Medicine

## 2019-01-01 ENCOUNTER — Emergency Department (HOSPITAL_COMMUNITY)
Admission: EM | Admit: 2019-01-01 | Discharge: 2019-01-09 | Disposition: E | Payer: Medicare Other | Source: Home / Self Care | Attending: Emergency Medicine | Admitting: Emergency Medicine

## 2019-01-01 DIAGNOSIS — Z8546 Personal history of malignant neoplasm of prostate: Secondary | ICD-10-CM | POA: Insufficient documentation

## 2019-01-01 DIAGNOSIS — R55 Syncope and collapse: Secondary | ICD-10-CM

## 2019-01-01 DIAGNOSIS — I10 Essential (primary) hypertension: Secondary | ICD-10-CM | POA: Insufficient documentation

## 2019-01-01 DIAGNOSIS — I2510 Atherosclerotic heart disease of native coronary artery without angina pectoris: Secondary | ICD-10-CM | POA: Insufficient documentation

## 2019-01-01 DIAGNOSIS — I252 Old myocardial infarction: Secondary | ICD-10-CM

## 2019-01-01 DIAGNOSIS — I509 Heart failure, unspecified: Secondary | ICD-10-CM

## 2019-01-01 DIAGNOSIS — Z7982 Long term (current) use of aspirin: Secondary | ICD-10-CM

## 2019-01-01 DIAGNOSIS — I480 Paroxysmal atrial fibrillation: Secondary | ICD-10-CM | POA: Diagnosis not present

## 2019-01-01 DIAGNOSIS — I4891 Unspecified atrial fibrillation: Secondary | ICD-10-CM

## 2019-01-01 DIAGNOSIS — Z79899 Other long term (current) drug therapy: Secondary | ICD-10-CM | POA: Insufficient documentation

## 2019-01-01 DIAGNOSIS — I5032 Chronic diastolic (congestive) heart failure: Secondary | ICD-10-CM

## 2019-01-01 DIAGNOSIS — I214 Non-ST elevation (NSTEMI) myocardial infarction: Secondary | ICD-10-CM | POA: Diagnosis present

## 2019-01-01 DIAGNOSIS — Z87891 Personal history of nicotine dependence: Secondary | ICD-10-CM

## 2019-01-01 DIAGNOSIS — Z95811 Presence of heart assist device: Secondary | ICD-10-CM

## 2019-01-01 DIAGNOSIS — I469 Cardiac arrest, cause unspecified: Secondary | ICD-10-CM

## 2019-01-01 LAB — BASIC METABOLIC PANEL
Anion gap: 14 (ref 5–15)
Anion gap: 13 (ref 5–15)
BUN: 28 mg/dL — ABNORMAL HIGH (ref 8–23)
BUN: 29 mg/dL — AB (ref 8–23)
CO2: 17 mmol/L — AB (ref 22–32)
CO2: 15 mmol/L — ABNORMAL LOW (ref 22–32)
CREATININE: 1.06 mg/dL (ref 0.61–1.24)
Calcium: 8.6 mg/dL — ABNORMAL LOW (ref 8.9–10.3)
Calcium: 8.6 mg/dL — ABNORMAL LOW (ref 8.9–10.3)
Chloride: 109 mmol/L (ref 98–111)
Chloride: 112 mmol/L — ABNORMAL HIGH (ref 98–111)
Creatinine, Ser: 1.08 mg/dL (ref 0.61–1.24)
GFR calc Af Amer: >60 mL/min (ref >60)
GFR calc Af Amer: >60 mL/min (ref >60)
GFR calc non Af Amer: 56 mL/min — ABNORMAL LOW (ref >60)
GFR calc non Af Amer: 58 mL/min — ABNORMAL LOW (ref >60)
Glucose, Bld: 100 mg/dL — ABNORMAL HIGH (ref 70–99)
Glucose, Bld: 113 mg/dL — ABNORMAL HIGH (ref 70–99)
Potassium: 3.6 mmol/L (ref 3.5–5.1)
Potassium: 3.9 mmol/L (ref 3.5–5.1)
Sodium: 140 mmol/L (ref 135–145)
Sodium: 140 mmol/L (ref 135–145)

## 2019-01-01 LAB — CBC WITH DIFFERENTIAL/PLATELET
Abs Immature Granulocytes: 0.09 10*3/uL — ABNORMAL HIGH (ref 0.00–0.07)
Basophils Absolute: 0 10*3/uL (ref 0.0–0.1)
Basophils Relative: 0 %
EOS ABS: 0.1 10*3/uL (ref 0.0–0.5)
EOS PCT: 1 %
HCT: 31.3 % — ABNORMAL LOW (ref 39.0–52.0)
Hemoglobin: 9.8 g/dL — ABNORMAL LOW (ref 13.0–17.0)
Immature Granulocytes: 1 %
Lymphocytes Relative: 13 %
Lymphs Abs: 1.7 10*3/uL (ref 0.7–4.0)
MCH: 30.5 pg (ref 26.0–34.0)
MCHC: 31.3 g/dL (ref 30.0–36.0)
MCV: 97.5 fL (ref 80.0–100.0)
MONOS PCT: 11 %
Monocytes Absolute: 1.5 10*3/uL — ABNORMAL HIGH (ref 0.1–1.0)
Neutro Abs: 10.3 10*3/uL — ABNORMAL HIGH (ref 1.7–7.7)
Neutrophils Relative %: 74 %
Platelets: 221 10*3/uL (ref 150–400)
RBC: 3.21 MIL/uL — ABNORMAL LOW (ref 4.22–5.81)
RDW: 15.6 % — ABNORMAL HIGH (ref 11.5–15.5)
WBC: 13.7 10*3/uL — ABNORMAL HIGH (ref 4.0–10.5)
nRBC: 0.2 % (ref 0.0–0.2)

## 2019-01-01 LAB — URINALYSIS, ROUTINE W REFLEX MICROSCOPIC
Bilirubin Urine: NEGATIVE
Glucose, UA: NEGATIVE mg/dL
Hgb urine dipstick: NEGATIVE
Ketones, ur: 5 mg/dL — AB
Leukocytes,Ua: NEGATIVE
Nitrite: NEGATIVE
PROTEIN: NEGATIVE mg/dL
Specific Gravity, Urine: 1.014 (ref 1.005–1.030)
pH: 5 (ref 5.0–8.0)

## 2019-01-01 LAB — CBC
HCT: 31.4 % — ABNORMAL LOW (ref 39.0–52.0)
Hemoglobin: 9.9 g/dL — ABNORMAL LOW (ref 13.0–17.0)
MCH: 30.6 pg (ref 26.0–34.0)
MCHC: 31.5 g/dL (ref 30.0–36.0)
MCV: 96.9 fL (ref 80.0–100.0)
Platelets: 224 10*3/uL (ref 150–400)
RBC: 3.24 MIL/uL — ABNORMAL LOW (ref 4.22–5.81)
RDW: 15.8 % — ABNORMAL HIGH (ref 11.5–15.5)
WBC: 16.5 10*3/uL — ABNORMAL HIGH (ref 4.0–10.5)
nRBC: 0.1 % (ref 0.0–0.2)

## 2019-01-01 LAB — BRAIN NATRIURETIC PEPTIDE: B Natriuretic Peptide: 1466.2 pg/mL — ABNORMAL HIGH (ref 0.0–100.0)

## 2019-01-01 LAB — CBG MONITORING, ED: Glucose-Capillary: 105 mg/dL — ABNORMAL HIGH (ref 70–99)

## 2019-01-01 MED ORDER — ATORVASTATIN CALCIUM 10 MG PO TABS: 10.0000 mg | ORAL_TABLET | Freq: Every day | ORAL | 2 refills | Status: AC

## 2019-01-01 MED ORDER — ATORVASTATIN CALCIUM 10 MG PO TABS: 10.0000 mg | ORAL_TABLET | Freq: Every day | ORAL | Status: DC

## 2019-01-01 MED ORDER — ASPIRIN 81 MG PO TBEC: 81.0000 mg | DELAYED_RELEASE_TABLET | Freq: Every day | ORAL | 2 refills | Status: AC

## 2019-01-01 MED ORDER — ASPIRIN EC 81 MG PO TBEC
81.0000 mg | DELAYED_RELEASE_TABLET | Freq: Every day | ORAL | Status: DC
  Administered 2019-01-01: 81 mg via ORAL
  Filled 2019-01-01: qty 1

## 2019-01-02 ENCOUNTER — Telehealth: Payer: Self-pay | Admitting: Family Medicine

## 2019-01-02 NOTE — Telephone Encounter (Signed)
Late entry.  Call from ER doctor.  Patient deceased.  ER doc asked if death certificate could be addressed by PCP.  I asked her to forward to PCP.  Routed as FYI.

## 2019-01-03 ENCOUNTER — Inpatient Hospital Stay: Payer: Medicare Other | Admitting: Internal Medicine

## 2019-01-09 NOTE — [place] FL2 (Signed)
[place] MEDICAID FL2 LEVEL OF CARE SCREENING TOOL     IDENTIFICATION  Patient Name: [name] Birthdate: 01-Apr-1917 Sex: male Admission Date (Current Location): 12/31/2018  [facility] and [place] Number:  Herbalist and Address:  [facility], [place] [address], [place], [place] [address]      Provider Number: [id number]  Attending Physician Name and Address:  Oswald Hillock, MD  Relative Name and Phone Number:       Current Level of Care: Hospital Recommended Level of Care: [place] Prior Approval Number:    Date Approved/Denied:   PASRR Number:    Discharge Plan: Other (Comment)(ALF)    Current Diagnoses: Patient Active Problem List   Diagnosis Date Noted  . NSTEMI (non-ST elevated myocardial infarction) ([place]) 01-02-19  . NSTEMI, initial episode of care ([place]) 12/31/2018  . Physical debility 12/31/2018  . Palliative care by specialist   . Dementia with behavioral disturbance ([place]) 12/28/2018  . Leukocytosis 12/28/2018  . Pressure injury of skin 12/27/2018  . Epistaxis, recurrent 12/26/2018  . Dysphagia 03/08/2015  . Pacemaker 01/30/2015  . Aortic stenosis 01/30/2015  . Elevated troponin 11/05/2014  . CVA (cerebral infarction) 11/04/2014  . Goals of care, counseling/discussion 12/14/2013  . Mitral valve disorder 01/07/2011  . OSTEOPOROSIS 08/13/2010  . Chronic kidney disease 04/04/2009  . UNSTEADY GAIT 12/19/2008  . ATRIAL FIBRILLATION, PAROXYSMAL 09/29/2008  . BARRETTS ESOPHAGUS 12/02/2007  . ADENOCARCINOMA, PROSTATE, HX OF 11/16/2007  . Essential hypertension 10/11/2007  . Elevated lipids 05/20/2007  . Coronary atherosclerosis 05/20/2007  . Chronic diastolic CHF (congestive heart failure) ([place]) 05/20/2007    Orientation RESPIRATION BLADDER Height & Weight     Place, Self  Normal Incontinent Weight: 153 lb (69.4 kg) Height:  5\' 6"  (167.6 cm)  BEHAVIORAL SYMPTOMS/MOOD NEUROLOGICAL BOWEL NUTRITION  STATUS      Continent Diet(regular diet, please see d/c summary )  AMBULATORY STATUS COMMUNICATION OF NEEDS Skin   Limited Assist Verbally PU Stage and Appropriate Care, Skin abrasions(Open wound on right nose) PU Stage 1 Dressing: (Buttocks, foam dressing, change PRN)                     Personal Care Assistance Level of Assistance  Dressing, Feeding, Bathing Bathing Assistance: Limited assistance Feeding assistance: Independent Dressing Assistance: Limited assistance     Functional Limitations Info  Sight, Hearing, Speech Sight Info: Adequate Hearing Info: Adequate Speech Info: Adequate    SPECIAL CARE FACTORS FREQUENCY  PT (By licensed PT), OT (By licensed OT)                    Contractures Contractures Info: Not present    Additional Factors Info  Code Status, Allergies Code Status Info: DNR Allergies Info: NO known allergies           Current Medications (January 02, 2019):  This is the current hospital active medication list Current Facility-Administered Medications  Medication Dose Route Frequency Provider Last Rate Last Dose  . 0.9 %  sodium chloride infusion   Intravenous Continuous Barb Merino, MD   Stopped at 01/02/2019 0344  . acetaminophen (TYLENOL) tablet 650 mg  650 mg Oral Q6H PRN Barb Merino, MD       Or  . acetaminophen (TYLENOL) suppository 650 mg  650 mg Rectal Q6H PRN Barb Merino, MD      . albuterol (PROVENTIL) (2.5 MG/3ML) 0.083% nebulizer solution 2.5 mg  2.5 mg Nebulization Q2H PRN Barb Merino,  MD      . aspirin EC tablet 81 mg  81 mg Oral Daily Oswald Hillock, MD   81 mg at 01/22/2019 0858  . atorvastatin (LIPITOR) tablet 10 mg  10 mg Oral q1800 Oswald Hillock, MD      . famotidine (PEPCID) tablet 20 mg  20 mg Oral Daily Barb Merino, MD   20 mg at 01/22/19 0857  . furosemide (LASIX) tablet 20 mg  20 mg Oral Daily Barb Merino, MD   20 mg at 2019-01-22 0858  . levothyroxine (SYNTHROID, LEVOTHROID) tablet 25 mcg  25 mcg Oral Q0600  Barb Merino, MD   25 mcg at 01-22-2019 0644  . nitroGLYCERIN (NITROSTAT) SL tablet 0.4 mg  0.4 mg Sublingual Q5 min PRN Barb Merino, MD      . potassium chloride (K-DUR,KLOR-CON) CR tablet 10 mEq  10 mEq Oral Daily Barb Merino, MD   10 mEq at 2019/01/22 0858  . vitamin C (ASCORBIC ACID) tablet 500 mg  500 mg Oral Daily Barb Merino, MD   500 mg at 01/22/19 1427     Discharge Medications: Please see discharge summary for a list of discharge medications.  Relevant Imaging Results:  Relevant Lab Results:   Additional Information SS#: [id number]  Eileen Stanford, LCSW

## 2019-01-09 NOTE — Discharge Instructions (Addendum)
Please follow up with your doctor to request higher level of care placement at  your facility.

## 2019-01-09 NOTE — ED Notes (Signed)
Patient's daughter is given number for patient placement

## 2019-01-09 NOTE — ED Notes (Addendum)
[facility] called-Not a candidate per representative r/t age&Ca Hx

## 2019-01-09 NOTE — Care Management CC44 (Signed)
Condition Code 44 Documentation Completed  Patient Details  Name: [name] MRN: [id number] Date of Birth: 1916-11-15   Condition Code 44 given:  Yes Patient signature on Condition Code 44 notice:  Yes Documentation of 2 MD's agreement:  Yes Code 44 added to claim:  Yes    Carles Collet, RN 2019-01-21, 9:48 AM

## 2019-01-09 NOTE — Progress Notes (Signed)
Chaplain called to ED to room 21. Patient died.  Chaplain provided ministry of support and prayer with daughter bedside. Patient is said to be a donor. Nurse is providing Patient Placement Card and gathering patient family info for chaplain, who was called to another room. Tamsen Snider Pager [phone]

## 2019-01-09 NOTE — Progress Notes (Signed)
Pt stable, DC to SNF with daughter via wheelchair, NT accompanied.

## 2019-01-09 NOTE — Care Management (Signed)
Spoke w patient's daughter. She will be in to provide transportation back to [facility] where patient lives w wife. She declined any further home assistance.

## 2019-01-09 NOTE — Discharge Summary (Addendum)
Physician Discharge Summary  [name] CVE:[id number] DOB: November 03, 1917 DOA: 12/31/2018  PCP: Hoyt Koch, MD  Admit date: 12/31/2018 Discharge date: 2019-01-08  Time spent: 50 minutes  Recommendations for Outpatient Follow-up:  1. Patient to follow-up with cardiology in 2 weeks 2. Palliative care services to follow at assisted living facility 3.  Those services can be accessed through either [facility] at [phone] or [facility] palliative medicine division at [phone]  Discharge Diagnoses:  Principal Problem:   NSTEMI, initial episode of care [facility]) Active Problems:   Essential hypertension   ATRIAL FIBRILLATION, PAROXYSMAL   Chronic diastolic CHF (congestive heart failure) (HCC)   Chronic kidney disease   Goals of care, counseling/discussion   Elevated troponin   Pacemaker   Epistaxis, recurrent   Dementia with behavioral disturbance [facility])   Physical debility   Palliative care by specialist   NSTEMI (non-ST elevated myocardial infarction) [facility])   Discharge Condition: Stable  Diet recommendation: Heart healthy diet  Filed Weights   12/31/18 1341  Weight: 69.4 kg    History of present illness:  85 -year-old Greer with a history of dementia, coronary artery disease, status post CABG, GERD, ICD in place, hypertension, hyperlipidemia, chronic atrial fibrillation, recent epistaxis and intolerance to Eliquis who was brought to the ED by EMS when he was found more confused and had unwitnessed fall at the assisted living facility.  Patient was found to have elevated troponin  0.82.  Family opted for medical management only.  No aggressive care.  Palliative care was consulted and confirmed that.  Hospital Course:   Non-STEMI-patient troponin peaked to 2.83 and is now coming down to 2.53.  Patient denies any pain.  Called and discussed with patient's daughter who says that just treat him medically.  No aggressive intervention  desired by family. We will start him on aspirin 81 mg daily, Lipitor 10 mg p.o. daily. Patient to follow-up with his cardiologist as outpatient. Palliative care services follow-up at assisted living facility  Paroxysmal atrial fibrillation-heart rate is controlled, Eliquis was discontinued due to epistaxis.  Dementia-stable  Procedures:    Consultations:  Palliative care  Discharge Exam: Vitals:   Jan 08, 2019 0527 2019/01/08 0700  BP: 108/77 104/63  Pulse: 88   Resp: (!) 21 (!) 23  Temp: 97.6 F (36.4 C)   SpO2: 100%     General: Appears in no acute distress Cardiovascular: S1-S2, regular, grade 4/6 systolic murmur auscultated at the aortic area Respiratory: Clear to auscultation bilaterally  Discharge Instructions   Discharge Instructions    Diet - low sodium heart healthy   Complete by:  As directed    Increase activity slowly   Complete by:  As directed      Allergies as of 08-Jan-2019   No Known Allergies     Medication List    TAKE these medications   acetaminophen 650 MG CR tablet Commonly known as:  TYLENOL Take 650 mg by mouth 2 (two) times daily. At 8am and 4pm   aspirin 81 MG EC tablet Take 1 tablet (81 mg total) by mouth daily.   atorvastatin 10 MG tablet Commonly known as:  LIPITOR Take 1 tablet (10 mg total) by mouth daily at 6 PM.   DESITIN 40 % Pste Generic drug:  Zinc Oxide Apply 1 application topically 3 (three) times daily.   furosemide 20 MG tablet Commonly known as:  LASIX Take 20 mg by mouth daily.   LUTEIN-ZEAXANTHIN PO Take 1 tablet  by mouth daily.   nitroGLYCERIN 0.4 MG SL tablet Commonly known as:  NITROSTAT Place 1 tablet (0.4 mg total) under the tongue every 5 (five) minutes as needed. What changed:  reasons to take this   potassium chloride 10 MEQ tablet Commonly known as:  K-DUR TAKE 1 TABLET (10 MEQ TOTAL) BY MOUTH DAILY. What changed:  See the new instructions.   ranitidine 150 MG tablet Commonly known as:   ZANTAC TAKE 1 TABLET (150 MG TOTAL) BY MOUTH AT BEDTIME. What changed:  See the new instructions.   SYNTHROID 25 MCG tablet Generic drug:  levothyroxine Take 25 mcg by mouth daily at 6 (six) AM.   vitamin C 500 MG tablet Commonly known as:  ASCORBIC ACID Take 500 mg by mouth daily.      No Known Allergies    The results of significant diagnostics from this hospitalization (including imaging, microbiology, ancillary and laboratory) are listed below for reference.    Significant Diagnostic Studies: Dg Chest 1 View  Result Date: 12/31/2018 CLINICAL DATA:  Altered level of consciousness EXAM: CHEST  1 VIEW COMPARISON:  05/25/2017 FINDINGS: Limited low volume chest. Cardiomegaly with CABG and dual-chamber pacer implant. Streaky density at the bases, chronic based on prior. Equivocal for hazy airspace density over the right mid lung. Trace right pleural effusion. No acute osseous finding. IMPRESSION: 1. Hazy density over the right mid chest, nonspecific in this setting. Please correlate for symptoms of pneumonia. 2. Cardiomegaly and trace right pleural effusion. Electronically Signed   By: Monte Fantasia M.D.   On: 12/31/2018 08:55   Dg Pelvis 1-2 Views  Result Date: 12/31/2018 CLINICAL DATA:  Patient with a history of dementia complaining of left upper leg and hip pain. Initial encounter. EXAM: PELVIS - 1-2 VIEW COMPARISON:  Single-view of the pelvis 03/03/2017. FINDINGS: No acute bony or joint abnormality is identified. There is some degenerative disease about the hips. Soft tissues demonstrate atherosclerosis. Bowel loops seen below the right inferior pubic ramus are consistent with a right inguinal hernia. IMPRESSION: No acute abnormality. Electronically Signed   By: Inge Rise M.D.   On: 12/31/2018 08:58   Ct Head Wo Contrast  Result Date: 12/31/2018 CLINICAL DATA:  Fall, altered level of consciousness. EXAM: CT HEAD WITHOUT CONTRAST CT CERVICAL SPINE WITHOUT CONTRAST TECHNIQUE:  Multidetector CT imaging of the head and cervical spine was performed following the standard protocol without intravenous contrast. Multiplanar CT image reconstructions of the cervical spine were also generated. COMPARISON:  CT scan of May 25, 2017. FINDINGS: CT HEAD FINDINGS Brain: Mild diffuse cortical atrophy is noted. Mild chronic ischemic white matter disease is noted. No mass effect or midline shift is noted. Ventricular size is within normal limits. There is no evidence of mass lesion, hemorrhage or acute infarction. Vascular: No hyperdense vessel or unexpected calcification. Skull: Normal. Negative for fracture or focal lesion. Sinuses/Orbits: No acute finding. Other: None. CT CERVICAL SPINE FINDINGS Alignment: Normal. Skull base and vertebrae: No acute fracture. No primary bone lesion or focal pathologic process. Soft tissues and spinal canal: No prevertebral fluid or swelling. No visible canal hematoma. Disc levels: Moderate degenerative disc disease is noted at C5-6, C6-7 and C7-T1. Upper chest: Right pleural effusion is noted in right lung apex. Other: Degenerative changes are seen involving the left-sided posterior facet joints. IMPRESSION: Mild diffuse cortical atrophy. Mild chronic ischemic white matter disease. No acute intracranial abnormality seen. Moderate multilevel degenerative disc disease. No acute abnormality seen in the cervical spine. Electronically Signed  By: Marijo Conception, M.D.   On: 12/31/2018 09:19   Ct Cervical Spine Wo Contrast  Result Date: 12/31/2018 CLINICAL DATA:  Fall, altered level of consciousness. EXAM: CT HEAD WITHOUT CONTRAST CT CERVICAL SPINE WITHOUT CONTRAST TECHNIQUE: Multidetector CT imaging of the head and cervical spine was performed following the standard protocol without intravenous contrast. Multiplanar CT image reconstructions of the cervical spine were also generated. COMPARISON:  CT scan of May 25, 2017. FINDINGS: CT HEAD FINDINGS Brain: Mild diffuse  cortical atrophy is noted. Mild chronic ischemic white matter disease is noted. No mass effect or midline shift is noted. Ventricular size is within normal limits. There is no evidence of mass lesion, hemorrhage or acute infarction. Vascular: No hyperdense vessel or unexpected calcification. Skull: Normal. Negative for fracture or focal lesion. Sinuses/Orbits: No acute finding. Other: None. CT CERVICAL SPINE FINDINGS Alignment: Normal. Skull base and vertebrae: No acute fracture. No primary bone lesion or focal pathologic process. Soft tissues and spinal canal: No prevertebral fluid or swelling. No visible canal hematoma. Disc levels: Moderate degenerative disc disease is noted at C5-6, C6-7 and C7-T1. Upper chest: Right pleural effusion is noted in right lung apex. Other: Degenerative changes are seen involving the left-sided posterior facet joints. IMPRESSION: Mild diffuse cortical atrophy. Mild chronic ischemic white matter disease. No acute intracranial abnormality seen. Moderate multilevel degenerative disc disease. No acute abnormality seen in the cervical spine. Electronically Signed   By: Marijo Conception, M.D.   On: 12/31/2018 09:19   Dg Femur Min 2 Views Left  Result Date: 12/31/2018 CLINICAL DATA:  Patient with a history of dementia complaining of left upper leg pain. Trauma history is unknown. Initial encounter. EXAM: LEFT FEMUR 2 VIEWS COMPARISON:  None. FINDINGS: No acute bony or joint abnormality is seen. Soft tissues demonstrate atherosclerosis. No acute soft tissue abnormality. IMPRESSION: No acute finding. Atherosclerosis. Electronically Signed   By: Inge Rise M.D.   On: 12/31/2018 08:55    Microbiology: No results found for this or any previous visit (from the past 240 hour(s)).   Labs: Basic Metabolic Panel: Recent Labs  Lab 12/26/18 1420 12/26/18 1433 12/31/18 0813 01/11/19 0346  NA 140 140 139 140  K 4.4 4.3 4.1 3.6  CL 109  --  107 109  CO2 24  --  19* 17*  GLUCOSE  119*  --  129* 100*  BUN 44*  --  35* 28*  CREATININE 0.98  --  1.15 1.08  CALCIUM 8.9  --  8.4* 8.6*   Liver Function Tests: Recent Labs  Lab 12/26/18 1420 12/31/18 0813  AST 40 52*  ALT 26 25  ALKPHOS 136* 132*  BILITOT 1.3* 1.2  PROT 5.3* 5.8*  ALBUMIN 3.0* 3.2*   No results for input(s): LIPASE, AMYLASE in the last 168 hours. No results for input(s): AMMONIA in the last 168 hours. CBC: Recent Labs  Lab 12/26/18 1420 12/26/18 1433 12/26/18 2005 12/27/18 0721 12/31/18 0813 01/11/19 0346  WBC 12.3*  --   --  18.2* 11.1* 16.5*  NEUTROABS  --   --   --   --  8.3*  --   HGB 10.4* 10.5* 9.8* 11.0* 9.1* 9.9*  HCT 33.8* 31.0* 31.5* 34.8* 30.6* 31.4*  MCV 94.9  --   --  94.6 100.0 96.9  PLT 205  --   --  195 185 224   Cardiac Enzymes: Recent Labs  Lab 12/31/18 0813 12/31/18 1044 12/31/18 1627 12/31/18 2236  TROPONINI 1.82* 1.49*  2.86* 2.53*   BNP:  CBG: Recent Labs  Lab 12/31/18 0814  GLUCAP 107*       Signed:  Oswald Hillock MD.  [facility] 29-Jan-2019, 9:54 AM

## 2019-01-09 NOTE — Care Management CC44 (Signed)
Condition Code 44 Documentation Completed  Patient Details  Name: KENSHIN SPLAWN MRN: [id number] Date of Birth: 11/07/1917   Condition Code 44 given:  Yes Patient signature on Condition Code 44 notice:  Yes Documentation of 2 MD's agreement:  Yes Code 44 added to claim:  Yes    Carles Collet, RN January 28, 2019, 9:48 AM

## 2019-01-09 NOTE — Plan of Care (Signed)

## 2019-01-09 NOTE — ED Triage Notes (Signed)
Patient was discharged from [address] this morning. He was getting in his car when he was witnessed to have have a syncopal event and incontinence and returned back to the ED.  Daughter at bedside

## 2019-01-09 NOTE — ED Provider Notes (Signed)
Patient pronounced dead at [facility]: 10.  Cardiac arrest.  Patient had been seen by providers [facility] and discharged to home.  As patient was being assisted to his daughter's vehicle, he went unresponsive and pulseless.  Patient was being brought back to the emergency department room.  On arrival, patient was white and pulseless without respiratory effort.  Patient was known for DNR.  Monitor rhythm showed ventricular fibrillation.  Patient's daughter arrived to the room.  She agreed patient was DNR and accepting of patient's sudden death.  She will call family members and make arrangements.  Patient's primary care doctor is Dr. Pricilla Holm.   Charlesetta Shanks, MD January 20, 2019 [phone]

## 2019-01-09 NOTE — ED Provider Notes (Signed)
[facility] EMERGENCY DEPARTMENT Provider Note   CSN: [id number] Arrival date & time: 01-25-19  1239    History   Chief Complaint No chief complaint on file.   HPI [name] is a 85 y.o. male.     The history is provided by a relative. No language interpreter was used.  Loss of Consciousness     85 year old male with history of atrial fibrillation following CAD, CHF, pacemaker, prostate cancer brought in for evaluation of syncope.  Patient was seen in the ED yesterday after a was brought in via EMS due to increased confusion and unwitnessed fall at an assisted living facility.  During evaluation patient was found to have an elevated troponin.  Patient was diagnosed with NSTEMI and admitted to the hospital.  No aggressive intervention patient was treated.  History of paroxysmal atrial fibrillation but Eliquis was discontinued due to epistaxis.  Patient sent home with aspirin and Lipitor.  Upon discharge and patient was into his car when he had a witnessed syncopal episode with urinary incontinence.  Daughter states patient appears confused afterward and was worried for a potential stroke.  Patient lives at nursing facility, ambulate using a roller walker.  History is limited due to hard of hearing and altered mental status.   Past Medical History:  Diagnosis Date  . Adenocarcinoma of prostate ([place]) 10/2005   lupron injections q60mo  . Arthritis    ?rheumatoid, never dx or on DMARDs  . Atrial fibrillation (HCC)    chronic anticoag  . Barrett's esophagus   . CAD (coronary artery disease)    CABG 08/1973  . CHF (congestive heart failure) ([place])    ICM  . Chronic renal insufficiency   . Diverticulosis of colon   . GERD (gastroesophageal reflux disease)   . Hyperlipidemia   . Hypertension   . Memory loss   . Osteoporosis   . Pacemaker 09/2003   RESULTS:  This demonstrates successful implantation of a Medtronic dual  . Paroxysmal atrial fibrillation ([place])    . Unsteady gait    due to B ankle DJD and instability    Patient Active Problem List   Diagnosis Date Noted  . NSTEMI (non-ST elevated myocardial infarction) ([place]) January 25, 2019  . NSTEMI, initial episode of care ([place]) 12/31/2018  . Physical debility 12/31/2018  . Palliative care by specialist   . Dementia with behavioral disturbance ([place]) 12/28/2018  . Leukocytosis 12/28/2018  . Pressure injury of skin 12/27/2018  . Epistaxis, recurrent 12/26/2018  . Dysphagia 03/08/2015  . Pacemaker 01/30/2015  . Aortic stenosis 01/30/2015  . Elevated troponin 11/05/2014  . CVA (cerebral infarction) 11/04/2014  . Goals of care, counseling/discussion 12/14/2013  . Mitral valve disorder 01/07/2011  . OSTEOPOROSIS 08/13/2010  . Chronic kidney disease 04/04/2009  . UNSTEADY GAIT 12/19/2008  . ATRIAL FIBRILLATION, PAROXYSMAL 09/29/2008  . BARRETTS ESOPHAGUS 12/02/2007  . ADENOCARCINOMA, PROSTATE, HX OF 11/16/2007  . Essential hypertension 10/11/2007  . Elevated lipids 05/20/2007  . Coronary atherosclerosis 05/20/2007  . Chronic diastolic CHF (congestive heart failure) ([place]) 05/20/2007    Past Surgical History:  Procedure Laterality Date  . CORONARY ARTERY BYPASS GRAFT  08/1973   3 heart heart arteries were 80 percent clogged. the lower valve of my heart is not functioning  . DOPPLER ECHOCARDIOGRAPHY  2004, 2008  . HERNIA REPAIR     age 25 ( not sure if it was right or left side)  . PACEMAKER GENERATOR CHANGE N/A 05/02/2013   Procedure: PACEMAKER GENERATOR  CHANGE;  Surgeon: Evans Lance, MD;  Location: [facility] CATH LAB;  Service: Cardiovascular;  Laterality: N/A;  . PACEMAKER PLACEMENT  09/2003        Home Medications    Prior to Admission medications   Medication Sig Start Date End Date Taking? Authorizing Provider  acetaminophen (TYLENOL) 650 MG CR tablet Take 650 mg by mouth 2 (two) times daily. At 8am and 4pm    [provider]  aspirin EC 81 MG EC tablet Take 1 tablet (81 mg  total) by mouth daily. 01-31-19   Oswald Hillock, MD  atorvastatin (LIPITOR) 10 MG tablet Take 1 tablet (10 mg total) by mouth daily at 6 PM. 01-31-19   Oswald Hillock, MD  furosemide (LASIX) 20 MG tablet Take 20 mg by mouth daily.     [provider]  levothyroxine (SYNTHROID) 25 MCG tablet Take 25 mcg by mouth daily at 6 (six) AM.     [provider]  LUTEIN-ZEAXANTHIN PO Take 1 tablet by mouth daily.     [provider]  nitroGLYCERIN (NITROSTAT) 0.4 MG SL tablet Place 1 tablet (0.4 mg total) under the tongue every 5 (five) minutes as needed. Patient taking differently: Place 0.4 mg under the tongue every 5 (five) minutes as needed for chest pain.  02/21/15   Josue Hector, MD  potassium chloride (K-DUR) 10 MEQ tablet TAKE 1 TABLET (10 MEQ TOTAL) BY MOUTH DAILY. Patient taking differently: Take 10 mEq by mouth daily.  09/12/15   Hoyt Koch, MD  ranitidine (ZANTAC) 150 MG tablet TAKE 1 TABLET (150 MG TOTAL) BY MOUTH AT BEDTIME. Patient taking differently: Take 150 mg by mouth at bedtime.  12/24/15   Hoyt Koch, MD  vitamin C (ASCORBIC ACID) 500 MG tablet Take 500 mg by mouth daily.     [provider]  Zinc Oxide (DESITIN) 40 % PSTE Apply 1 application topically 3 (three) times daily.    [provider]    Family History Family History  Problem Relation Age of Onset  . Cancer Mother   . Heart attack Father     Social History Social History   Tobacco Use  . Smoking status: Former Smoker    Last attempt to quit: 08/05/1986    Years since quitting: 32.4  . Smokeless tobacco: Never Used  Substance Use Topics  . Alcohol use: Yes    Comment: samll glass of wine  . Drug use: Not on file     Allergies   Patient has no known allergies.   Review of Systems Review of Systems  Unable to perform ROS: Mental status change  Cardiovascular: Positive for syncope.     Physical Exam Updated Vital Signs There were no vitals  taken for this visit.  Physical Exam Vitals signs and nursing note reviewed.  Constitutional:      General: He is not in acute distress.    Appearance: He is well-developed. He is ill-appearing (ichronically ll appearing elderly male, hard of hearing).  HENT:     Head: Atraumatic.     Comments: Mouth is dry Eyes:     Extraocular Movements: Extraocular movements intact.     Conjunctiva/sclera: Conjunctivae normal.     Pupils: Pupils are equal, round, and reactive to light.  Neck:     Musculoskeletal: Normal range of motion and neck supple. No neck rigidity.  Cardiovascular:     Rate and Rhythm: Rhythm irregular.     Heart sounds: Murmur  present.  Pulmonary:     Effort: Pulmonary effort is normal.     Breath sounds: Normal breath sounds.  Abdominal:     Palpations: Abdomen is soft.     Tenderness: There is no abdominal tenderness.  Musculoskeletal:     Comments: Global weakness with decreased grip strength bilaterally, unable to lift either leg off the bed.   Skin:    Findings: No rash.  Neurological:     Mental Status: He is alert.     GCS: GCS eye subscore is 3. GCS verbal subscore is 4. GCS motor subscore is 6.     Cranial Nerves: Cranial nerves are intact.     Sensory: Sensation is intact.     Motor: Weakness present.      ED Treatments / Results  Labs (all labs ordered are listed, but only abnormal results are displayed) Labs Reviewed  BASIC METABOLIC PANEL - Abnormal; Notable for the following components:      Result Value   Chloride 112 (*)    CO2 15 (*)    Glucose, Bld 113 (*)    BUN 29 (*)    Calcium 8.6 (*)    GFR calc non Af Amer 58 (*)    All other components within normal limits  CBC WITH DIFFERENTIAL/PLATELET - Abnormal; Notable for the following components:   WBC 13.7 (*)    RBC 3.21 (*)    Hemoglobin 9.8 (*)    HCT 31.3 (*)    RDW 15.6 (*)    Neutro Abs 10.3 (*)    Monocytes Absolute 1.5 (*)    Abs Immature Granulocytes 0.09 (*)    All other  components within normal limits  URINALYSIS, ROUTINE W REFLEX MICROSCOPIC - Abnormal; Notable for the following components:   Ketones, ur 5 (*)    All other components within normal limits  CBG MONITORING, ED - Abnormal; Notable for the following components:   Glucose-Capillary 105 (*)    All other components within normal limits  BRAIN NATRIURETIC PEPTIDE    EKG EKG Interpretation  Date/Time:  2019/01/03 12:52:11 EST Ventricular Rate:  85 PR Interval:    QRS Duration: 137 QT Interval:  401 QTC Calculation: 480 R Axis:   79 Text Interpretation:  Atrial fibrillation Electronic ventricular pacemaker Non-specific ST-t changes `similar to old ecg 05/2017 Reconfirmed by Lajean Saver [phone]) on 01/03/19 3:53:25 PM   Radiology Dg Chest 1 View  Result Date: 12/31/2018 CLINICAL DATA:  Altered level of consciousness EXAM: CHEST  1 VIEW COMPARISON:  05/25/2017 FINDINGS: Limited low volume chest. Cardiomegaly with CABG and dual-chamber pacer implant. Streaky density at the bases, chronic based on prior. Equivocal for hazy airspace density over the right mid lung. Trace right pleural effusion. No acute osseous finding. IMPRESSION: 1. Hazy density over the right mid chest, nonspecific in this setting. Please correlate for symptoms of pneumonia. 2. Cardiomegaly and trace right pleural effusion. Electronically Signed   By: Monte Fantasia M.D.   On: 12/31/2018 08:55   Dg Pelvis 1-2 Views  Result Date: 12/31/2018 CLINICAL DATA:  Patient with a history of dementia complaining of left upper leg and hip pain. Initial encounter. EXAM: PELVIS - 1-2 VIEW COMPARISON:  Single-view of the pelvis 03/03/2017. FINDINGS: No acute bony or joint abnormality is identified. There is some degenerative disease about the hips. Soft tissues demonstrate atherosclerosis. Bowel loops seen below the right inferior pubic ramus are consistent with a right inguinal hernia. IMPRESSION: No acute abnormality.  Electronically Signed   By: Inge Rise M.D.   On: 12/31/2018 08:58   Ct Head Wo Contrast  Result Date: 12/31/2018 CLINICAL DATA:  Fall, altered level of consciousness. EXAM: CT HEAD WITHOUT CONTRAST CT CERVICAL SPINE WITHOUT CONTRAST TECHNIQUE: Multidetector CT imaging of the head and cervical spine was performed following the standard protocol without intravenous contrast. Multiplanar CT image reconstructions of the cervical spine were also generated. COMPARISON:  CT scan of May 25, 2017. FINDINGS: CT HEAD FINDINGS Brain: Mild diffuse cortical atrophy is noted. Mild chronic ischemic white matter disease is noted. No mass effect or midline shift is noted. Ventricular size is within normal limits. There is no evidence of mass lesion, hemorrhage or acute infarction. Vascular: No hyperdense vessel or unexpected calcification. Skull: Normal. Negative for fracture or focal lesion. Sinuses/Orbits: No acute finding. Other: None. CT CERVICAL SPINE FINDINGS Alignment: Normal. Skull base and vertebrae: No acute fracture. No primary bone lesion or focal pathologic process. Soft tissues and spinal canal: No prevertebral fluid or swelling. No visible canal hematoma. Disc levels: Moderate degenerative disc disease is noted at C5-6, C6-7 and C7-T1. Upper chest: Right pleural effusion is noted in right lung apex. Other: Degenerative changes are seen involving the left-sided posterior facet joints. IMPRESSION: Mild diffuse cortical atrophy. Mild chronic ischemic white matter disease. No acute intracranial abnormality seen. Moderate multilevel degenerative disc disease. No acute abnormality seen in the cervical spine. Electronically Signed   By: Marijo Conception, M.D.   On: 12/31/2018 09:19   Ct Cervical Spine Wo Contrast  Result Date: 12/31/2018 CLINICAL DATA:  Fall, altered level of consciousness. EXAM: CT HEAD WITHOUT CONTRAST CT CERVICAL SPINE WITHOUT CONTRAST TECHNIQUE: Multidetector CT imaging of the head and  cervical spine was performed following the standard protocol without intravenous contrast. Multiplanar CT image reconstructions of the cervical spine were also generated. COMPARISON:  CT scan of May 25, 2017. FINDINGS: CT HEAD FINDINGS Brain: Mild diffuse cortical atrophy is noted. Mild chronic ischemic white matter disease is noted. No mass effect or midline shift is noted. Ventricular size is within normal limits. There is no evidence of mass lesion, hemorrhage or acute infarction. Vascular: No hyperdense vessel or unexpected calcification. Skull: Normal. Negative for fracture or focal lesion. Sinuses/Orbits: No acute finding. Other: None. CT CERVICAL SPINE FINDINGS Alignment: Normal. Skull base and vertebrae: No acute fracture. No primary bone lesion or focal pathologic process. Soft tissues and spinal canal: No prevertebral fluid or swelling. No visible canal hematoma. Disc levels: Moderate degenerative disc disease is noted at C5-6, C6-7 and C7-T1. Upper chest: Right pleural effusion is noted in right lung apex. Other: Degenerative changes are seen involving the left-sided posterior facet joints. IMPRESSION: Mild diffuse cortical atrophy. Mild chronic ischemic white matter disease. No acute intracranial abnormality seen. Moderate multilevel degenerative disc disease. No acute abnormality seen in the cervical spine. Electronically Signed   By: Marijo Conception, M.D.   On: 12/31/2018 09:19   Dg Femur Min 2 Views Left  Result Date: 12/31/2018 CLINICAL DATA:  Patient with a history of dementia complaining of left upper leg pain. Trauma history is unknown. Initial encounter. EXAM: LEFT FEMUR 2 VIEWS COMPARISON:  None. FINDINGS: No acute bony or joint abnormality is seen. Soft tissues demonstrate atherosclerosis. No acute soft tissue abnormality. IMPRESSION: No acute finding. Atherosclerosis. Electronically Signed   By: Inge Rise M.D.   On: 12/31/2018 08:55    Procedures Procedures (including critical  care time)  Medications Ordered in ED Medications -  No data to display   Initial Impression / Assessment and Plan / ED Course  I have reviewed the triage vital signs and the nursing notes.  Pertinent labs & imaging results that were available during my care of the patient were reviewed by me and considered in my medical decision making (see chart for details).        BP 113/64   Pulse 68   Temp 97.6 F (36.4 C) (Axillary)   Resp (!) 21   SpO2 98%    Final Clinical Impressions(s) / ED Diagnoses   Final diagnoses:  Syncope, unspecified syncope type    ED Discharge Orders    None     1:33 PM Pt recently hospitalized for a fall and altered mental status.  Pt diagnosed with NSTEMI with conservative medical management and d/c back to facility with palliative f/u.  He apparently had a syncopal episode once he was out in the car to go home and was brought back.  Daughter concern for stroke, however he is DNR/DNI and aside from global weakness, and confusion, no obvious focal neuro deficit identified.  He has pacemaker, will interrogate.  Work up initiated.  Care discussed with Dr. Ashok Cordia.   3:13 PM Pace maker was interrogated.  Pt noted to have multiple strings of accelerated atrial rate in the 400s without ventricular changes. Last runs was 12/29/2018 and going for 65 hrs.  Labs are at baseline for him.  UA without infection.  After discussion with Dr. Ashok Cordia as well as pt's daughter, we felt the best course of action is to have him return to his facility.  Palliative care will be involve to help pt transition to higher care.  Further work up or admission may not yield added benefit at this time.     Domenic Moras, PA-C 2019/01/26 1558    Lajean Saver, MD 01/02/19 [phone]

## 2019-01-09 NOTE — ED Notes (Addendum)
Patient was discharged from ED and taken by wheelchair to go home by car. Daughter pulled car up to ED entrance and patient assisted to car. Once placed in car patient became apneic and pulseless. Patient a DNR. Staff removed patient from car and placed on EMS stretcher. Taken back to room 21 and placed on cardiac monitor. Rhythm noted as V. Fib and daughter reports that he wished for no resuscitation and Dr. Johnney Killian at bedside and followed daughters and patients wishes with no shock. Patient apneic.

## 2019-01-09 NOTE — ED Notes (Signed)
Supplemental Oxygen applied as patient is de-sating on room air

## 2019-01-09 NOTE — Clinical Social Work Note (Signed)
Clinical Social Work Assessment  Patient Details  Name: [name] MRN: [id number] Date of Birth: 18-May-1917  Date of referral:  2019/01/21               Reason for consult:  Facility Placement                Permission sought to share information with:  Chartered certified accountant granted to share information::  Yes, Verbal Permission Granted  Name::     Landscape architect::  Carriage HOuse  Relationship::  daughter  Contact Information:     Housing/Transportation Living arrangements for the past 2 months:  [place] of Information:  Adult Children Patient Interpreter Needed:  None Criminal Activity/Legal Involvement Pertinent to Current Situation/Hospitalization:  No - Comment as needed Significant Relationships:  Adult Children Lives with:  Self Do you feel safe going back to the place where you live?  Yes Need for family participation in patient care:  No (Coment)  Care giving concerns:  Pt only alert to self and place. NO family or friends present at bedside.   Social Worker assessment / plan:  CSW spoke with pt's daughter via telephone. Pt's daughter confirmed pt is from [facility]-- and pt's daughter confirmed pt will return. Pt's daughter will transport. CSW to get facility appropriate documents prior to pt's d/c.  Employment status:  Retired Forensic scientist:  Medicare PT Recommendations:  Not assessed at this time [place] / Referral to community resources:  Other (Comment Required)(ALF)  Patient/Family's Response to care:  Pt's daughter verbalized understanding of CSW role and expressed appreciation for support. Pt's daughter denies any concern regarding pt care at this time.   Patient/Family's Understanding of and Emotional Response to Diagnosis, Current Treatment, and Prognosis:  Pt's daughter agreeable for pt to return to [facility]. No others concerns noted at this time.   Emotional Assessment Appearance:   Appears stated age Attitude/Demeanor/Rapport:  Unable to Assess Affect (typically observed):  Unable to Assess Orientation:  Oriented to Self, Oriented to Place Alcohol / Substance use:  Not Applicable Psych involvement (Current and /or in the community):  No (Comment)  Discharge Needs  Concerns to be addressed:  No discharge needs identified Readmission within the last 30 days:  Yes Current discharge risk:  None Barriers to Discharge:  No Barriers Identified   Jabier Deese A Ladene Allocca, LCSW 2019/01/21, 11:27 AM

## 2019-01-09 NOTE — Evaluation (Signed)
Physical Therapy Evaluation Patient Details Name: [name] MRN: [id number] DOB: [birth year] Today's Date: 01-17-2019   History of Present Illness  Patient is a 85 y/o male presenting to the ED on 12/31/18 due to fall at ALF. Admitted for NSTEMI. PMH significant for dementia, coronary artery disease status post CABG, GERD, ICD in place, hypertension, hyperlipidemia, chronic atrial fibrillation, recent epistaxis and intolerance to Eliquis.     Clinical Impression  [name] is a very pleasant 85 y/o male admitted with the above listed diagnosis. Per chart review patient from ALF, where he was mobile with RW. Patient today requiring Mod/Max A for bed level mobility and transfers with RW. Patient requiring physical assist to stand from bedside and BSC as well as perform pivot transfer with noted poor weight shift and LE clearance from floor. Will recommend SNF at discharge if ALF is unable to provide higher level of care and therapies. PT to follow acutely.     Follow Up Recommendations SNF;Supervision/Assistance - 24 hour    Equipment Recommendations  None recommended by PT    Recommendations for Other Services       Precautions / Restrictions Precautions Precautions: Fall Precaution Comments: watch vitals Restrictions Weight Bearing Restrictions: No      Mobility  Bed Mobility Overal bed mobility: Needs Assistance Bed Mobility: Supine to Sit;Sit to Supine     Supine to sit: Min assist Sit to supine: Mod assist   General bed mobility comments: Min/Mod A for LE and trunk management  Transfers Overall transfer level: Needs assistance Equipment used: Rolling walker (2 wheeled) Transfers: Sit to/from [facility] Sit to Stand: Mod assist Stand pivot transfers: Mod assist;Max assist       General transfer comment: Mod A to power up from sitting x 5; pivot to/from [facility] with up to Max A for stability and safety  Ambulation/Gait             General  Gait Details: deferred for patient/therapist safety  Stairs            Wheelchair Mobility    Modified Rankin (Stroke Patients Only)       Balance Overall balance assessment: Needs assistance Sitting-balance support: No upper extremity supported;Feet supported Sitting balance-Leahy Scale: Fair     Standing balance support: Bilateral upper extremity supported;During functional activity Standing balance-Leahy Scale: Poor                               Pertinent Vitals/Pain Pain Assessment: No/denies pain    Home Living Family/patient expects to be discharged to:: Assisted living               Home Equipment: Gilford Rile - 2 wheels Additional Comments: lives at [place] with wife - uses RW for mobility    Prior Function Level of Independence: Needs assistance   Gait / Transfers Assistance Needed: gait with RW     Comments: unsure if needs additional assist due to patient being a poor historian     Hand Dominance        Extremity/Trunk Assessment   Upper Extremity Assessment Upper Extremity Assessment: Defer to OT evaluation    Lower Extremity Assessment Lower Extremity Assessment: Generalized weakness    Cervical / Trunk Assessment Cervical / Trunk Assessment: Kyphotic  Communication   Communication: HOH  Cognition Arousal/Alertness: Awake/alert Behavior During Therapy: WFL for tasks assessed/performed Overall Cognitive Status: History of cognitive impairments - at baseline  General Comments General comments (skin integrity, edema, etc.): VSS throughout, no complaints of chest pain    Exercises     Assessment/Plan    PT Assessment Patient needs continued PT services  PT Problem List Decreased strength;Decreased activity tolerance;Decreased balance;Decreased mobility;Decreased knowledge of use of DME;Decreased safety awareness       PT Treatment Interventions DME instruction;Gait  training;Functional mobility training;Therapeutic activities;Therapeutic exercise;Balance training;Patient/family education    PT Goals (Current goals can be found in the Care Plan section)  Acute Rehab PT Goals Patient Stated Goal: reduce knee/ankle pain PT Goal Formulation: With patient Time For Goal Achievement: 01/15/19 Potential to Achieve Goals: Fair    Frequency Min 3X/week   Barriers to discharge        Co-evaluation               AM-PAC PT "6 Clicks" Mobility  Outcome Measure Help needed turning from your back to your side while in a flat bed without using bedrails?: A Little Help needed moving from lying on your back to sitting on the side of a flat bed without using bedrails?: A Little Help needed moving to and from a bed to a chair (including a wheelchair)?: A Lot Help needed standing up from a chair using your arms (e.g., wheelchair or bedside chair)?: A Lot Help needed to walk in hospital room?: Total Help needed climbing 3-5 steps with a railing? : Total 6 Click Score: 12    End of Session Equipment Utilized During Treatment: Gait belt Activity Tolerance: Patient tolerated treatment well Patient left: in bed;with call bell/phone within reach Nurse Communication: Mobility status PT Visit Diagnosis: Unsteadiness on feet (R26.81);Other abnormalities of gait and mobility (R26.89);Muscle weakness (generalized) (M62.81)    Time: 1020-1101 PT Time Calculation (min) (ACUTE ONLY): 41 min   Charges:   PT Evaluation $PT Eval Moderate Complexity: 1 Mod PT Treatments $Therapeutic Activity: 23-37 mins        Lanney Gins, PT, DPT Supplemental Physical Therapist January 03, 2019 11:43 AM Pager: [phone] Office: [phone]

## 2019-01-09 DEATH — deceased

## 2019-01-27 ENCOUNTER — Encounter: Payer: Medicare Other | Admitting: Internal Medicine
